# Patient Record
Sex: Male | Born: 1940 | Race: White | Hispanic: No | Smoking: Former smoker
Health system: Southern US, Community
[De-identification: ages and names within clinical notes are randomized; demographics above are authoritative.]

## PROBLEM LIST (undated history)

## (undated) DIAGNOSIS — I2510 Atherosclerotic heart disease of native coronary artery without angina pectoris: Secondary | ICD-10-CM

## (undated) DIAGNOSIS — E7800 Pure hypercholesterolemia, unspecified: Secondary | ICD-10-CM

## (undated) DIAGNOSIS — R42 Dizziness and giddiness: Secondary | ICD-10-CM

## (undated) DIAGNOSIS — E119 Type 2 diabetes mellitus without complications: Secondary | ICD-10-CM

## (undated) HISTORY — PX: NASAL SEPTUM SURGERY: SHX37

## (undated) HISTORY — PX: FOOT SURGERY: SHX648

## (undated) HISTORY — PX: KNEE SURGERY: SHX244

## (undated) HISTORY — PX: CARDIAC CATHETERIZATION: SHX172

---

## 1998-08-07 ENCOUNTER — Ambulatory Visit (HOSPITAL_BASED_OUTPATIENT_CLINIC_OR_DEPARTMENT_OTHER): Admission: RE | Admit: 1998-08-07 | Discharge: 1998-08-07 | Payer: Self-pay | Admitting: Orthopedic Surgery

## 2000-04-08 ENCOUNTER — Encounter: Payer: Self-pay | Admitting: Family Medicine

## 2000-04-08 ENCOUNTER — Ambulatory Visit (HOSPITAL_COMMUNITY): Admission: RE | Admit: 2000-04-08 | Discharge: 2000-04-08 | Payer: Self-pay | Admitting: Family Medicine

## 2002-03-23 ENCOUNTER — Ambulatory Visit (HOSPITAL_COMMUNITY): Admission: RE | Admit: 2002-03-23 | Discharge: 2002-03-23 | Payer: Self-pay | Admitting: Family Medicine

## 2002-03-23 ENCOUNTER — Encounter: Payer: Self-pay | Admitting: Family Medicine

## 2002-04-05 ENCOUNTER — Encounter: Payer: Self-pay | Admitting: Family Medicine

## 2002-04-05 ENCOUNTER — Encounter: Admission: RE | Admit: 2002-04-05 | Discharge: 2002-04-05 | Payer: Self-pay | Admitting: Family Medicine

## 2005-01-17 ENCOUNTER — Emergency Department (HOSPITAL_COMMUNITY): Admission: EM | Admit: 2005-01-17 | Discharge: 2005-01-17 | Payer: Self-pay | Admitting: Emergency Medicine

## 2005-01-24 ENCOUNTER — Ambulatory Visit (HOSPITAL_BASED_OUTPATIENT_CLINIC_OR_DEPARTMENT_OTHER): Admission: RE | Admit: 2005-01-24 | Discharge: 2005-01-24 | Payer: Self-pay | Admitting: Otolaryngology

## 2005-02-14 ENCOUNTER — Ambulatory Visit: Admission: RE | Admit: 2005-02-14 | Discharge: 2005-02-14 | Payer: Self-pay | Admitting: Family Medicine

## 2009-10-04 ENCOUNTER — Encounter: Admission: RE | Admit: 2009-10-04 | Discharge: 2009-11-22 | Payer: Self-pay | Admitting: Family Medicine

## 2010-10-24 ENCOUNTER — Encounter (INDEPENDENT_AMBULATORY_CARE_PROVIDER_SITE_OTHER): Payer: Self-pay | Admitting: Internal Medicine

## 2010-10-24 ENCOUNTER — Ambulatory Visit: Payer: Self-pay | Admitting: Cardiovascular Disease

## 2010-10-24 ENCOUNTER — Inpatient Hospital Stay (HOSPITAL_COMMUNITY): Admission: EM | Admit: 2010-10-24 | Discharge: 2010-10-27 | Payer: Self-pay | Admitting: Emergency Medicine

## 2010-11-20 ENCOUNTER — Emergency Department (HOSPITAL_COMMUNITY)
Admission: EM | Admit: 2010-11-20 | Discharge: 2010-11-20 | Payer: Self-pay | Source: Home / Self Care | Admitting: Family Medicine

## 2011-01-29 NOTE — H&P (Signed)
NAME:  Fred [name], Fred [name]               ACCOUNT NO.:  [id number]  MEDICAL RECORD NO.:  [id number]          PATIENT TYPE:  EMS  LOCATION:  MAJO                         FACILITY:  [facility]  PHYSICIAN:  Lucile Crater, MD         DATE OF BIRTH:  [birth year]  DATE OF ADMISSION:  10/24/2010 DATE OF DISCHARGE:                             HISTORY & PHYSICAL   CHIEF COMPLAINT:  Syncope.  HISTORY OF PRESENT ILLNESS:  The patient is a 70 year old Caucasian gentleman with a history of diabetes mellitus and hyperlipidemia.  He was attempting to have a bowel movement today and he experienced severe perianal pain and then he had a brief episode of loss of consciousness. The patient's wife witnessed the episode and she states that it lasted for about 2 minutes and after the patient regained his consciousness he was reportedly confused.  He had changed his shirt but he does not remember doing it.  There was no seizure witnessed.  He did not hit his head.  A CT of the head was done in the ER and it was negative for any acute changes.  Upon further questioning Fred [name] states that he had a similar episode in the past following viral gastroenteritis and he passed out when he was trying to stand up.  No workup was done at that time.  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  But he states that he has not been bothered by the symptoms in the past few years.  He denies having had any palpitations at the time of the episode.  He denies having any chest pain.  There is no blurry vision.  REVIEW OF SYSTEMS:  A complete review of systems was done which included general, head, eyes, ears, nose, throat, cardiovascular, respiratory, GI, GU, endocrine, musculoskeletal, skin, neurologic and psychiatric all within normal limits other than  what is mentioned in the history of present illness.  PAST MEDICAL HISTORY: 1. Diabetes mellitus. 2. Hyperlipidemia. 3. Concussion in October 2010. 4. Vertigo. 5. Remote history of tobacco abuse.  ALLERGIES:  None.  CURRENT MEDICATIONS: 1. Onglyza 5 mg once a day. 2. Glipizide 20 mg b.i.d. 3. Simvastatin 40 mg once a day. 4. Metformin 1000 mg once a day. 5. Aspirin 81 mg once a day. 6. Zocor dose unknown.  SOCIAL HISTORY:  He is a Research scientist (life sciences).  There is remote history of tobacco abuse.  He quit smoking in 1964.  He was smoking close to three packs a day.  He did that for 8 years.  There is a remote history of heavy alcohol consumption.  He does not drink any more.  He currently lives in [place].  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  FAMILY HISTORY:  There is no family history of sudden cardiac death, coronary artery disease or CVA.  PHYSICAL EXAMINATION:  VITALS: T-max 97.1, pulse rate  65, respiratory rate 19, blood pressure 110/58, O2 sats 98% on room air. GENERAL APPEARANCE: Not in acute distress, alert, awake and oriented to time, place, person and situation. HEENT: Normocephalic, atraumatic.  Pupils are equal and reactive to light and accommodation.  Extraocular are intact.  The mucous membranes are moist. NECK: Supple.  No JVD, lymphadenopathy or carotid bruit. CVS: Regular rhythm, rate is normal.  No murmurs, rubs or gallops. LUNGS: Clear to auscultation bilaterally. ABDOMEN: Benign. EXTREMITIES: No clubbing, cyanosis or edema. NEUROLOGIC:  Exam grossly nonfocal.  LABORATORY DATA AND STUDIES: 1. EKG shows normal sinus rhythm at rate of 69 beats per minute.     There is right axis deviation.  Normal PR and QT intervals.     Incomplete right bundle branch block is evident.  There are no ST     or T changes. 2. Troponin 0.01.  Lipase 42. 3. CK-MB 1.7. 4. Sodium 136, potassium 3.6, chloride 104, bicarb 24,  BUN 14,     creatinine 1, blood glucose 204. 5. Total protein 6.6, albumin 3.8, ALT 25, AST 25, alkaline     phosphatase 44, total bilirubin 0.4. 6. INR 0.94. 7. CT of the head:  No acute intracranial abnormalities.  Stable mild     age-appropriate cortical atrophy. 8. Stool for occult blood is negative. 9. WBC 9500, hemoglobin 14, hematocrit 41, platelets 196,000.     Differential is normal.  ASSESSMENT AND PLAN: 1. Syncope, multiple possibilities exist including a transient     ischemic attack versus an arrhythmia versus vasovagal amongst     others.  The CT of the head was negative.  He had an episode in the     past which was attributed to his gastroenteritis, no workup was     done.  He does have risk factors of diabetes, hypertension, age.     We will evaluate with an MRI of the brain.  We will also get a 2-D     echocardiogram.  We will consider getting a tilt-table study, will     hold off on it for now.  We will watch him in telemetry to evaluate     for any arrhythmias. 2. Diabetes mellitus.  Will hold off on oral medications while he is     in the hospital.  We will have him on sliding-scale coverage with     short-acting insulin. 3. Hyperlipidemia.  Will obtain a fasting lipid panel.  Will continue     the statin. 4. Deep vein thrombosis prophylaxis with sequential compression     devices.  CODE STATUS:  Full.    Lucile Crater, MD    TA/MEDQ  D:  10/24/2010  T:  10/24/2010  Job:  [id number]  Electronically Signed by Lucile Crater MD on 01/29/2011 04:10:03 PM

## 2011-02-19 LAB — GLUCOSE, CAPILLARY
Glucose-Capillary: 209 mg/dL — ABNORMAL HIGH (ref 70–99)
Glucose-Capillary: 227 mg/dL — ABNORMAL HIGH (ref 70–99)
Glucose-Capillary: 253 mg/dL — ABNORMAL HIGH (ref 70–99)
Glucose-Capillary: 278 mg/dL — ABNORMAL HIGH (ref 70–99)
Glucose-Capillary: 308 mg/dL — ABNORMAL HIGH (ref 70–99)
Glucose-Capillary: 372 mg/dL — ABNORMAL HIGH (ref 70–99)

## 2011-02-19 LAB — CK TOTAL AND CKMB (NOT AT ARMC): Total CK: 111 U/L (ref 7–232)

## 2011-02-19 LAB — PROTIME-INR: Prothrombin Time: 12.8 seconds (ref 11.6–15.2)

## 2011-02-19 LAB — CARDIAC PANEL(CRET KIN+CKTOT+MB+TROPI)
CK, MB: 1.3 ng/mL (ref 0.3–4.0)
Relative Index: 0.9 (ref 0.0–2.5)
Troponin I: <0.01 ng/mL (ref 0.00–0.06)

## 2011-02-19 LAB — URINE CULTURE

## 2011-02-19 LAB — LIPID PANEL
Cholesterol: 117 mg/dL (ref 0–200)
LDL Cholesterol: 68 mg/dL (ref 0–99)
Triglycerides: 92 mg/dL (ref <150)

## 2011-02-19 LAB — DIFFERENTIAL
Eosinophils Absolute: 0.4 10*3/uL (ref 0.0–0.7)
Lymphs Abs: 2.9 10*3/uL (ref 0.7–4.0)
Neutrophils Relative %: 54 % (ref 43–77)

## 2011-02-19 LAB — COMPREHENSIVE METABOLIC PANEL
ALT: 25 U/L (ref 0–53)
CO2: 24 mEq/L (ref 19–32)
Calcium: 8.5 mg/dL (ref 8.4–10.5)
Creatinine, Ser: 1.03 mg/dL (ref 0.4–1.5)
GFR calc non Af Amer: >60 mL/min (ref >60)
Glucose, Bld: 204 mg/dL — ABNORMAL HIGH (ref 70–99)
Sodium: 136 mEq/L (ref 135–145)

## 2011-02-19 LAB — TSH: TSH: 3.12 u[IU]/mL (ref 0.350–4.500)

## 2011-02-19 LAB — CBC
MCH: 31.9 pg (ref 26.0–34.0)
MCHC: 34.4 g/dL (ref 30.0–36.0)
Platelets: 196 10*3/uL (ref 150–400)
RDW: 12.8 % (ref 11.5–15.5)

## 2011-02-19 LAB — LIPASE, BLOOD: Lipase: 42 U/L (ref 11–59)

## 2011-02-19 LAB — PHOSPHORUS: Phosphorus: 3.3 mg/dL (ref 2.3–4.6)

## 2011-02-19 LAB — TROPONIN I: Troponin I: 0.01 ng/mL (ref 0.00–0.06)

## 2011-04-26 NOTE — Op Note (Signed)
NAME:  Fred [name], Fred [name]               ACCOUNT NO.:  [id number]   MEDICAL RECORD NO.:  [id number]          PATIENT TYPE:  AMB   LOCATION:  DSC                          FACILITY:  [facility]   PHYSICIAN:  Jefry H. Pollyann Kennedy, MD     DATE OF BIRTH:  1941/04/02   DATE OF PROCEDURE:  01/24/2005  DATE OF DISCHARGE:                                 OPERATIVE REPORT   PREOPERATIVE DIAGNOSES:  Displaced nasal fracture.   POSTOPERATIVE DIAGNOSES:  Displaced nasal fracture.   PROCEDURE:  Closed reduction nasal fracture with stabilization.   SURGEON:  Jefry H. Pollyann Kennedy, MD   ANESTHESIA:  General endotracheal anesthesia was used.   COMPLICATIONS:  None.   BLOOD LOSS:  Minimal.   FINDINGS:  Severely depressed right nasal bone fracture with lateralization  of the left nasal bone. 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 There was also an S-shaped deformity of the septum which  was corrected nicely with the procedure.   REFERRING PHYSICIAN:  Leonides Sake, M.D.   HISTORY:  This is a 70 year old gentleman who was sick with the flu about a  week and half ago and got lightheaded, fell on his face and fractured his  nose. The risks, benefits, alternatives, and complications of the procedure  were explained to the patient and his wife who seemed to understand and  agreed to surgery.   PROCEDURE:  The patient to the operating room, placed on the operating table  in supine position. Following induction of general endotracheal anesthesia,  the patient was draped in the standard fashion. Oxymetazoline spray was used  preoperatively in the nasal cavities. 1% Xylocaine with epinephrine was  infiltrated into the upper septum, the nasal vault mucosa and the superior  attachments of the middle turbinates bilaterally. Afrin soaked pledgets were  placed in the nasal cavities. A butter knife, nasal elevator and Ash forceps  were both used to elevate the septum and to elevate the right nasal  bone  while simultaneous digital manipulation was used to medialized the nasal  dorsum from the left side. There was good reduction of the fracture. Again  the saddle nose deformity was still remaining which I suspect was a  premorbid condition. The right nasal vault area was fairly stable but was  stabilized  additionally with some folded up Gelfoam. The nasal dorsum was dressed with  Benzoin, Steri-Strips and an Aquaplast splint. The pharynx was suctioned of  blood and secretions. The patient was then awakened, extubated, and  transferred to recovery in stable condition.      JHR/MEDQ  D:  01/24/2005  T:  01/24/2005  Job:  [id number]

## 2013-04-26 ENCOUNTER — Emergency Department (HOSPITAL_COMMUNITY)
Admission: EM | Admit: 2013-04-26 | Discharge: 2013-04-26 | Disposition: A | Discharge status: Home / Self Care | Payer: Medicare Other | Attending: Emergency Medicine | Admitting: Emergency Medicine

## 2013-04-26 ENCOUNTER — Emergency Department (HOSPITAL_COMMUNITY): Payer: Medicare Other

## 2013-04-26 ENCOUNTER — Encounter (HOSPITAL_COMMUNITY): Payer: Self-pay | Admitting: Emergency Medicine

## 2013-04-26 DIAGNOSIS — S81009A Unspecified open wound, unspecified knee, initial encounter: Secondary | ICD-10-CM | POA: Insufficient documentation

## 2013-04-26 DIAGNOSIS — Z79899 Other long term (current) drug therapy: Secondary | ICD-10-CM | POA: Insufficient documentation

## 2013-04-26 DIAGNOSIS — S81812A Laceration without foreign body, left lower leg, initial encounter: Secondary | ICD-10-CM

## 2013-04-26 DIAGNOSIS — W298XXA Contact with other powered powered hand tools and household machinery, initial encounter: Secondary | ICD-10-CM | POA: Insufficient documentation

## 2013-04-26 DIAGNOSIS — Y9389 Activity, other specified: Secondary | ICD-10-CM | POA: Insufficient documentation

## 2013-04-26 DIAGNOSIS — E119 Type 2 diabetes mellitus without complications: Secondary | ICD-10-CM | POA: Insufficient documentation

## 2013-04-26 DIAGNOSIS — Y929 Unspecified place or not applicable: Secondary | ICD-10-CM | POA: Insufficient documentation

## 2013-04-26 DIAGNOSIS — Z23 Encounter for immunization: Secondary | ICD-10-CM | POA: Insufficient documentation

## 2013-04-26 DIAGNOSIS — S81809A Unspecified open wound, unspecified lower leg, initial encounter: Secondary | ICD-10-CM | POA: Insufficient documentation

## 2013-04-26 HISTORY — DX: Type 2 diabetes mellitus without complications: E11.9

## 2013-04-26 MED ORDER — CEPHALEXIN 500 MG PO CAPS: 500.0000 mg | ORAL_CAPSULE | Freq: Four times a day (QID) | ORAL | Status: DC

## 2013-04-26 MED ORDER — LIDOCAINE-EPINEPHRINE 2 %-1:100000 IJ SOLN
20.0000 mL | INTRAMUSCULAR | Status: AC
  Administered 2013-04-26: 20 mL via INTRADERMAL
  Filled 2013-04-26: qty 20

## 2013-04-26 MED ORDER — HYDROCODONE-ACETAMINOPHEN 5-325 MG PO TABS: 1.0000 | ORAL_TABLET | Freq: Four times a day (QID) | ORAL | Status: DC | PRN

## 2013-04-26 MED ORDER — TETANUS-DIPHTH-ACELL PERTUSSIS 5-2.5-18.5 LF-MCG/0.5 IM SUSP
0.5000 mL | Freq: Once | INTRAMUSCULAR | Status: AC
  Administered 2013-04-26: 0.5 mL via INTRAMUSCULAR
  Filled 2013-04-26: qty 0.5

## 2013-04-26 NOTE — ED Notes (Signed)
BIB family. Laceration to left lower shin with chainsaw. Bleeding controlled at this time. Sensation intact distal to injury. Able to move toes and foot.

## 2013-04-26 NOTE — ED Provider Notes (Signed)
History    CSN: [id number] Arrival date & time 04/26/13  1531 First MD Initiated Contact with Patient 04/26/13 1632     Chief Complaint  Patient presents with  . Extremity Laceration   HPI Pt was using a chainsaw and accidentally cut his left lower shin.  He denies any other injuries.  No numbness or weakness.  No difficulty with movement.  No active bleeding at this time.  He is not sure when his last tetanus shot was. His pain is mild at this point.  Past Medical History  Diagnosis Date  . Diabetes mellitus without complication     History reviewed. No pertinent past surgical history.  History reviewed. No pertinent family history.  History  Substance Use Topics  . Smoking status: Never Smoker   . Smokeless tobacco: Not on file  . Alcohol Use: No      Review of Systems  All other systems reviewed and are negative.    Allergies  Review of patient's allergies indicates no known allergies.  Home Medications   Current Outpatient Rx  Name  Route  Sig  Dispense  Refill  . aspirin EC 81 MG tablet   Oral   Take 81 mg by mouth daily.         . Cholecalciferol (VITAMIN D) 2000 UNITS tablet   Oral   Take 2,000 Units by mouth daily.         Marland Kitchen CINNAMON PO   Oral   Take 1,000 mg by mouth 2 (two) times daily.         . fish oil-omega-3 fatty acids 1000 MG capsule   Oral   Take 1 g by mouth 2 (two) times daily.         . metFORMIN (GLUMETZA) 1000 MG (MOD) 24 hr tablet   Oral   Take 1,000 mg by mouth 2 (two) times daily.         . naproxen sodium (ANAPROX) 220 MG tablet   Oral   Take 220 mg by mouth daily as needed (for pain and for sleep).         Marland Kitchen OVER THE COUNTER MEDICATION   Oral   Take 1 tablet by mouth 2 (two) times daily. Gluco support         . SIMVASTATIN PO   Oral   Take 40 mg by mouth every evening.          . cephALEXin (KEFLEX) 500 MG capsule   Oral   Take 1 capsule (500 mg total) by mouth 4 (four) times daily.   28 capsule  0   . HYDROcodone-acetaminophen (NORCO) 5-325 MG per tablet   Oral   Take 1-2 tablets by mouth every 6 (six) hours as needed for pain.   16 tablet   0     BP 123/71  Pulse 86  Temp(Src) 98.2 F (36.8 C) (Oral)  Resp 17  SpO2 95%  Physical Exam  Nursing note and vitals reviewed. Constitutional: He appears well-developed and well-nourished. No distress.  HENT:  Head: Normocephalic and atraumatic.  Right Ear: External ear normal.  Left Ear: External ear normal.  Eyes: Conjunctivae are normal. Right eye exhibits no discharge. Left eye exhibits no discharge. No scleral icterus.  Neck: Neck supple. No tracheal deviation present.  Cardiovascular: Normal rate, regular rhythm and intact distal pulses.   Pulmonary/Chest: Effort normal and breath sounds normal. No stridor. No respiratory distress. He has no wheezes. He has no rales.  Abdominal: Soft.  He exhibits no distension.  Musculoskeletal:  Anterior left shin with macerated irregular laceration with tissue defect, superficial, no debris noted in the wound, distal nv intact, strong dp pulse, normal sensation  Neurological: He is alert. He has normal strength. No sensory deficit. Cranial nerve deficit:  no gross defecits noted. He exhibits normal muscle tone. He displays no seizure activity. Coordination normal.  Skin: Skin is warm and dry. No rash noted.  Psychiatric: He has a normal mood and affect.    ED Course  Procedures (including critical care time)  Labs Reviewed - No data to display Dg Tibia/fibula Left  04/26/2013   *RADIOLOGY REPORT*  Clinical Data: Laceration anteriorly, chainsaw, history diabetes  LEFT TIBIA AND FIBULA - 2 VIEW  Comparison: None  Findings: Pretibial soft tissue irregularity and gas present at presumed laceration site at mid lower leg. No acute fracture, dislocation or bone destruction. No definite radiopaque foreign body. Bones appear slightly demineralized.  IMPRESSION: No acute osseous abnormalities.    Original Report Authenticated By: Ulyses Southward, M.D.     1. Laceration of leg, left, initial encounter       MDM  Laceration repaired under my supervision.  No sign of fracture.  Wound irrigated.  Due to tissue loss wound re approximated loosely.        Celene Kras, MD 04/26/13 [phone]

## 2013-04-26 NOTE — ED Notes (Signed)
Patient transported to X-ray 

## 2013-04-26 NOTE — ED Notes (Signed)
EDMD at bedside suturing lac

## 2013-07-20 NOTE — Procedures (Signed)
LACERATION REPAIR Date/Time: 07/20/2013 12:29 PM Performed by: Toney Sang Authorized by: Linwood Dibbles R Consent: Verbal consent obtained. Consent given by: patient Imaging studies: imaging studies available Patient identity confirmed: verbally with patient, arm band and hospital-assigned identification number Body area: lower extremity Location details: left lower leg Foreign bodies: no foreign bodies Tendon involvement: none Nerve involvement: none Vascular damage: no Anesthesia: local infiltration Local anesthetic: lidocaine 2% with epinephrine Patient sedated: no Preparation: Patient was prepped and draped in the usual sterile fashion. Irrigation solution: saline Amount of cleaning: extensive Skin closure: 3-0 nylon Approximation: loose Approximation difficulty: simple Patient tolerance: Patient tolerated the procedure well with no immediate complications.

## 2013-11-02 ENCOUNTER — Other Ambulatory Visit: Payer: Self-pay | Admitting: Family Medicine

## 2013-11-02 ENCOUNTER — Ambulatory Visit
Admission: RE | Admit: 2013-11-02 | Discharge: 2013-11-02 | Disposition: A | Discharge status: Home / Self Care | Payer: Medicare Other | Source: Ambulatory Visit | Attending: Family Medicine | Admitting: Family Medicine

## 2013-11-02 DIAGNOSIS — M545 Low back pain: Secondary | ICD-10-CM

## 2013-11-11 ENCOUNTER — Emergency Department (HOSPITAL_COMMUNITY)
Admission: EM | Admit: 2013-11-11 | Discharge: 2013-11-11 | Disposition: A | Discharge status: Home / Self Care | Payer: Medicare Other | Attending: Emergency Medicine | Admitting: Emergency Medicine

## 2013-11-11 ENCOUNTER — Emergency Department (HOSPITAL_COMMUNITY): Payer: Medicare Other

## 2013-11-11 ENCOUNTER — Encounter (HOSPITAL_COMMUNITY): Payer: Self-pay | Admitting: Emergency Medicine

## 2013-11-11 DIAGNOSIS — Z7982 Long term (current) use of aspirin: Secondary | ICD-10-CM | POA: Insufficient documentation

## 2013-11-11 DIAGNOSIS — R55 Syncope and collapse: Secondary | ICD-10-CM | POA: Insufficient documentation

## 2013-11-11 DIAGNOSIS — Z792 Long term (current) use of antibiotics: Secondary | ICD-10-CM | POA: Insufficient documentation

## 2013-11-11 DIAGNOSIS — E119 Type 2 diabetes mellitus without complications: Secondary | ICD-10-CM | POA: Insufficient documentation

## 2013-11-11 DIAGNOSIS — E7800 Pure hypercholesterolemia, unspecified: Secondary | ICD-10-CM | POA: Insufficient documentation

## 2013-11-11 DIAGNOSIS — R42 Dizziness and giddiness: Secondary | ICD-10-CM | POA: Insufficient documentation

## 2013-11-11 DIAGNOSIS — Z79899 Other long term (current) drug therapy: Secondary | ICD-10-CM | POA: Insufficient documentation

## 2013-11-11 HISTORY — DX: Pure hypercholesterolemia, unspecified: E78.00

## 2013-11-11 HISTORY — DX: Dizziness and giddiness: R42

## 2013-11-11 LAB — POCT I-STAT, CHEM 8
BUN: 16 mg/dL (ref 6–23)
Calcium, Ion: 1.18 mmol/L (ref 1.13–1.30)
Chloride: 103 mEq/L (ref 96–112)
Creatinine, Ser: 1.2 mg/dL (ref 0.50–1.35)
Glucose, Bld: 203 mg/dL — ABNORMAL HIGH (ref 70–99)
HCT: 41 % (ref 39.0–52.0)
Hemoglobin: 13.9 g/dL (ref 13.0–17.0)
Potassium: 3.9 mEq/L (ref 3.5–5.1)
Sodium: 139 mEq/L (ref 135–145)
TCO2: 25 mmol/L (ref 0–100)

## 2013-11-11 LAB — GLUCOSE, CAPILLARY: Glucose-Capillary: 195 mg/dL — ABNORMAL HIGH (ref 70–99)

## 2013-11-11 LAB — CBC
HCT: 44.9 % (ref 39.0–52.0)
Hemoglobin: 15.6 g/dL (ref 13.0–17.0)
MCH: 32.8 pg (ref 26.0–34.0)
MCHC: 34.7 g/dL (ref 30.0–36.0)
MCV: 94.3 fL (ref 78.0–100.0)
Platelets: 231 10*3/uL (ref 150–400)
RBC: 4.76 MIL/uL (ref 4.22–5.81)
RDW: 12.8 % (ref 11.5–15.5)
WBC: 8 10*3/uL (ref 4.0–10.5)

## 2013-11-11 LAB — URINALYSIS, ROUTINE W REFLEX MICROSCOPIC
Bilirubin Urine: NEGATIVE
Hgb urine dipstick: NEGATIVE
Ketones, ur: NEGATIVE mg/dL
Leukocytes, UA: NEGATIVE
Nitrite: NEGATIVE
Protein, ur: NEGATIVE mg/dL
Urobilinogen, UA: 0.2 mg/dL (ref 0.0–1.0)

## 2013-11-11 LAB — POCT I-STAT TROPONIN I: Troponin i, poc: 0 ng/mL (ref 0.00–0.08)

## 2013-11-11 MED ORDER — SODIUM CHLORIDE 0.9 % IV BOLUS (SEPSIS)
1000.0000 mL | Freq: Once | INTRAVENOUS | Status: AC
  Administered 2013-11-11: 1000 mL via INTRAVENOUS

## 2013-11-11 MED ORDER — SODIUM CHLORIDE 0.9 % IV SOLN: INTRAVENOUS | Status: DC

## 2013-11-11 NOTE — ED Notes (Signed)
Pt ambulated to restroom with slow, steady gait, and denies dizziness

## 2013-11-11 NOTE — ED Notes (Signed)
Patient transported to CT 

## 2013-11-11 NOTE — ED Notes (Signed)
Pt having dizziness x 2 days, hx of vertigo. Was at church with increase in dizziness and unsteady gait.

## 2013-11-11 NOTE — ED Provider Notes (Signed)
CSN: [id number]     Arrival date & time 11/11/13  1447 History   First MD Initiated Contact with Patient 11/11/13 1504     Chief Complaint  Patient presents with  . Near Syncope   (Consider location/radiation/quality/duration/timing/severity/associated sxs/prior Treatment) HPI  72 year old male with history of non-insulin-dependent diabetes, vertigo, and high cholesterol presents for evaluations of dizziness/lightheadedness. Patient reports for the past 2 weeks he has had intermittent bouts of lightheadedness. Symptoms usually happen with positional changes. He usually has to pause for seconds to regain his balance however for the past 3 or 4 days this particular sensation has been progressively worse. Today while at church as he was standing he became lightheadedness, unsteady and fell. He did not hit his head and had no loss of consciousness. He did not injure himself but felt that he would need further evaluation in the ER. Patient states that aside from a lightheadedness he denies having any fever, chills, headache, double vision, slurring speech, trouble thinking, neck pain, chest pain, shortness of breath, productive cough, abdominal pain, nausea, vomiting, diarrhea, dysuria, numbness, or rash. He did notice 2 separate episodes of specks of blood in his undergarment. He denies any sensations of room spinning around. He has been taking muscle relaxant prescribed by his doctor for his low back pain which has improved. He discontinued medication 2 days ago. No other medication changes. Otherwise he has been eating and drinking as usual.  Prior hx revealed pt was admitted for evaluation of syncope in 2011, was found to be due to reflex syncope.  Normal 2D echo, brain MRI without evidence of stroke.    Past Medical History  Diagnosis Date  . Diabetes mellitus without complication   . Vertigo   . High cholesterol    History reviewed. No pertinent past surgical history. History reviewed. No  pertinent family history. History  Substance Use Topics  . Smoking status: Never Smoker   . Smokeless tobacco: Not on file  . Alcohol Use: No    Review of Systems  All other systems reviewed and are negative.    Allergies  Review of patient's allergies indicates no known allergies.  Home Medications   Current Outpatient Rx  Name  Route  Sig  Dispense  Refill  . aspirin EC 81 MG tablet   Oral   Take 81 mg by mouth daily.         . cephALEXin (KEFLEX) 500 MG capsule   Oral   Take 1 capsule (500 mg total) by mouth 4 (four) times daily.   28 capsule   0   . Cholecalciferol (VITAMIN D) 2000 UNITS tablet   Oral   Take 2,000 Units by mouth daily.         Marland Kitchen CINNAMON PO   Oral   Take 1,000 mg by mouth 2 (two) times daily.         . fish oil-omega-3 fatty acids 1000 MG capsule   Oral   Take 1 g by mouth 2 (two) times daily.         Marland Kitchen HYDROcodone-acetaminophen (NORCO) 5-325 MG per tablet   Oral   Take 1-2 tablets by mouth every 6 (six) hours as needed for pain.   16 tablet   0   . metFORMIN (GLUMETZA) 1000 MG (MOD) 24 hr tablet   Oral   Take 1,000 mg by mouth 2 (two) times daily.         . naproxen sodium (ANAPROX) 220 MG tablet  Oral   Take 220 mg by mouth daily as needed (for pain and for sleep).         Marland Kitchen OVER THE COUNTER MEDICATION   Oral   Take 1 tablet by mouth 2 (two) times daily. Gluco support         . SIMVASTATIN PO   Oral   Take 40 mg by mouth every evening.           BP 132/62  Pulse 96  Temp(Src) 98.6 F (37 C)  Resp 20  SpO2 97% Physical Exam  Nursing note and vitals reviewed. Constitutional: He is oriented to person, place, and time. He appears well-developed and well-nourished. No distress.  Awake, alert, nontoxic appearance  HENT:  Head: Atraumatic.  Right Ear: External ear normal.  Left Ear: External ear normal.  Mouth/Throat: Oropharynx is clear and moist.  Eyes: Conjunctivae are normal. Right eye exhibits no  discharge. Left eye exhibits no discharge.  Neck: Normal range of motion. Neck supple.  Cardiovascular: Normal rate and regular rhythm.  Exam reveals no gallop and no friction rub.   No murmur heard. Pulmonary/Chest: Effort normal. No respiratory distress. He exhibits no tenderness.  Abdominal: Soft. There is no tenderness. There is no rebound.  Genitourinary:  Prostate non tender, normal rectal tone, no mass, no frank blood, normal color stool.  Musculoskeletal: He exhibits no tenderness.  ROM appears intact, no obvious focal weakness  Neurological: He is alert and oriented to person, place, and time.  Speech clear, pupils equal round reactive to light, extraocular movements intact   Normal peripheral visual fields Cranial nerves III through XII normal including no facial droop Cranial Nerves:  II:  Visual fields grossly normal with blinking noted to bilateral confrontation, pupils equal, round, reactive to light and accommodation  III,IV, VI: ptosis not present, extra-ocular motions intact bilaterally  V,VII: smile symmetric  VIII: hearing normal bilaterally  IX,X: gag reflex present  XI: bilateral shoulder shrug  XII: midline tongue extension Follows commands, moves all extremities x4, normal strength to bilateral upper and lower extremities at all major muscle groups including grip Sensation normal to light touch  Coordination intact, no limb ataxia, finger-nose-finger normal Rapid alternating movements normal No pronator drift Gait normal   Skin: Skin is warm and dry. No rash noted.  Psychiatric: He has a normal mood and affect.    ED Course  Procedures (including critical care time)   Date: 11/11/2013  Rate: 92  Rhythm: normal sinus rhythm  QRS Axis: normal  Intervals: normal  ST/T Wave abnormalities: normal  Conduction Disutrbances: none  Narrative Interpretation:   Old EKG Reviewed: No significant changes noted     4:10 PM Patient presents with near syncope  and a recent fall without any significant trauma. No vertiginous symptoms. He does report having specks of blood in his undergarment although no evidence of dysuria. No obvious gross focal neuro deficit on initial exam. Workup initiated.  7:05 PM With with positive orthostasis, likely autonomic dysfunction.  No evidence of infection, no significant anemia, head CT without acute changes.  I do not suspect acute stroke.  Pt able to tolerates PO. IVF given.  Care discussed with attending.  Pt felt he prefers to go home.    9:12 PM Pt stable for discharge.  Return precaution discussed.  Attending agrees with plan.    Labs Review Labs Reviewed  URINALYSIS, ROUTINE W REFLEX MICROSCOPIC - Abnormal; Notable for the following:    Glucose, UA 250 (*)  All other components within normal limits  GLUCOSE, CAPILLARY - Abnormal; Notable for the following:    Glucose-Capillary 195 (*)    All other components within normal limits  POCT I-STAT, CHEM 8 - Abnormal; Notable for the following:    Glucose, Bld 203 (*)    All other components within normal limits  CBC  POCT I-STAT TROPONIN I   Imaging Review Ct Head Wo Contrast  11/11/2013   CLINICAL DATA:  Syncopal episode, status post fall.  EXAM: CT HEAD WITHOUT CONTRAST  TECHNIQUE: Contiguous axial images were obtained from the base of the skull through the vertex without intravenous contrast.  COMPARISON:  MRI of the brain dated October 24, 2010. And CT scan of the brain of the same day  FINDINGS: There is mild diffuse age-appropriate cerebral atrophy with mild compensatory ventriculomegaly. These findings are stable. There is no evidence of an acute intracranial hemorrhage. There is no evidence of an evolving ischemic infarction. The cerebellum and brainstem exhibit normal density. There are no abnormal intracranial calcifications.  At bone window settings the observed portions of the paranasal sinuses and mastoid air cells are clear. There is no evidence  of an acute skull fracture.  IMPRESSION: 1. There is no evidence of an acute intracranial hemorrhage nor of an evolving ischemic infarction. 2. There is mild age appropriate cerebral atrophy with mild compensatory ventriculomegaly. 3. There is no evidence of an acute skull fracture.   Electronically Signed   By: David  [place]   On: 11/11/2013 16:45    EKG Interpretation   None       MDM   1. Near syncope    BP 139/75  Pulse 73  Temp(Src) 97.7 F (36.5 C) (Oral)  Resp 16  SpO2 97%  I have reviewed nursing notes and vital signs. I personally reviewed the imaging tests through PACS system  I reviewed available ER/hospitalization records thought the EMR     Fayrene Helper, [place] 11/11/13 2112

## 2013-11-11 NOTE — ED Notes (Signed)
PT ambulates well to restroom

## 2013-11-12 LAB — OCCULT BLOOD, POC DEVICE: Fecal Occult Bld: NEGATIVE

## 2013-11-17 NOTE — ED Provider Notes (Signed)
Medical screening examination/treatment/procedure(s) were performed by non-physician practitioner and as supervising physician I was immediately available for consultation/collaboration.  EKG Interpretation    Date/Time:  Thursday November 11 2013 15:05:54 EST Ventricular Rate:  90 PR Interval:  150 QRS Duration: 94 QT Interval:  370 QTC Calculation: 453 R Axis:   73 Text Interpretation:  Age not entered, assumed to be  72 years old for purpose of ECG interpretation Sinus rhythm Probable left atrial enlargement ED PHYSICIAN INTERPRETATION AVAILABLE IN [facility] Confirmed by TEST, RECORD ([id number]), editor CLAYTON  CCT  CETT, ROBIN (2) on 11/13/2013 8:20:36 AM             Raeford Razor, MD 11/17/13 1339

## 2016-04-08 ENCOUNTER — Ambulatory Visit
Admission: RE | Admit: 2016-04-08 | Discharge: 2016-04-08 | Disposition: A | Discharge status: Home / Self Care | Payer: Medicare Other | Source: Ambulatory Visit | Attending: Family Medicine | Admitting: Family Medicine

## 2016-04-08 ENCOUNTER — Other Ambulatory Visit: Payer: Self-pay | Admitting: Family Medicine

## 2016-04-08 DIAGNOSIS — M542 Cervicalgia: Secondary | ICD-10-CM

## 2018-10-26 DIAGNOSIS — H9121 Sudden idiopathic hearing loss, right ear: Secondary | ICD-10-CM | POA: Insufficient documentation

## 2018-12-07 ENCOUNTER — Other Ambulatory Visit: Payer: Self-pay | Admitting: Otolaryngology

## 2018-12-07 DIAGNOSIS — H9121 Sudden idiopathic hearing loss, right ear: Secondary | ICD-10-CM

## 2018-12-17 ENCOUNTER — Ambulatory Visit
Admission: RE | Admit: 2018-12-17 | Discharge: 2018-12-17 | Disposition: A | Discharge status: Home / Self Care | Payer: Medicare Other | Source: Ambulatory Visit | Attending: Otolaryngology | Admitting: Otolaryngology

## 2018-12-17 DIAGNOSIS — H9121 Sudden idiopathic hearing loss, right ear: Secondary | ICD-10-CM

## 2018-12-17 MED ORDER — GADOBENATE DIMEGLUMINE 529 MG/ML IV SOLN
19.0000 mL | Freq: Once | INTRAVENOUS | Status: AC | PRN
  Administered 2018-12-17: 19 mL via INTRAVENOUS

## 2019-02-25 ENCOUNTER — Emergency Department (HOSPITAL_BASED_OUTPATIENT_CLINIC_OR_DEPARTMENT_OTHER): Payer: Medicare Other

## 2019-02-25 ENCOUNTER — Encounter (HOSPITAL_BASED_OUTPATIENT_CLINIC_OR_DEPARTMENT_OTHER): Payer: Self-pay | Admitting: *Deleted

## 2019-02-25 ENCOUNTER — Other Ambulatory Visit: Payer: Self-pay

## 2019-02-25 ENCOUNTER — Emergency Department (HOSPITAL_BASED_OUTPATIENT_CLINIC_OR_DEPARTMENT_OTHER)
Admission: EM | Admit: 2019-02-25 | Discharge: 2019-02-25 | Disposition: A | Discharge status: Home / Self Care | Payer: Medicare Other | Attending: Emergency Medicine | Admitting: Emergency Medicine

## 2019-02-25 DIAGNOSIS — Z7984 Long term (current) use of oral hypoglycemic drugs: Secondary | ICD-10-CM | POA: Diagnosis not present

## 2019-02-25 DIAGNOSIS — Z79899 Other long term (current) drug therapy: Secondary | ICD-10-CM | POA: Insufficient documentation

## 2019-02-25 DIAGNOSIS — E119 Type 2 diabetes mellitus without complications: Secondary | ICD-10-CM | POA: Diagnosis not present

## 2019-02-25 DIAGNOSIS — N200 Calculus of kidney: Secondary | ICD-10-CM | POA: Insufficient documentation

## 2019-02-25 DIAGNOSIS — R1031 Right lower quadrant pain: Secondary | ICD-10-CM | POA: Diagnosis present

## 2019-02-25 DIAGNOSIS — Z7982 Long term (current) use of aspirin: Secondary | ICD-10-CM | POA: Diagnosis not present

## 2019-02-25 LAB — CBC WITH DIFFERENTIAL/PLATELET
Abs Immature Granulocytes: 0.03 10*3/uL (ref 0.00–0.07)
Basophils Absolute: 0.1 10*3/uL (ref 0.0–0.1)
Basophils Relative: 1 %
EOS PCT: 8 %
Eosinophils Absolute: 0.9 10*3/uL — ABNORMAL HIGH (ref 0.0–0.5)
HCT: 41.3 % (ref 39.0–52.0)
Hemoglobin: 13.7 g/dL (ref 13.0–17.0)
Immature Granulocytes: 0 %
Lymphocytes Relative: 25 %
Lymphs Abs: 2.6 10*3/uL (ref 0.7–4.0)
MCH: 31.9 pg (ref 26.0–34.0)
MCHC: 33.2 g/dL (ref 30.0–36.0)
MCV: 96.3 fL (ref 80.0–100.0)
MONO ABS: 1.1 10*3/uL — AB (ref 0.1–1.0)
Monocytes Relative: 11 %
Neutro Abs: 5.8 10*3/uL (ref 1.7–7.7)
Neutrophils Relative %: 55 %
Platelets: 233 10*3/uL (ref 150–400)
RBC: 4.29 MIL/uL (ref 4.22–5.81)
RDW: 12.8 % (ref 11.5–15.5)
WBC: 10.5 10*3/uL (ref 4.0–10.5)
nRBC: 0 % (ref 0.0–0.2)

## 2019-02-25 LAB — URINALYSIS, ROUTINE W REFLEX MICROSCOPIC
Bilirubin Urine: NEGATIVE
Glucose, UA: >=500 mg/dL — AB
Ketones, ur: NEGATIVE mg/dL
Leukocytes,Ua: NEGATIVE
Nitrite: NEGATIVE
Protein, ur: NEGATIVE mg/dL
Specific Gravity, Urine: 1.015 (ref 1.005–1.030)
pH: 6.5 (ref 5.0–8.0)

## 2019-02-25 LAB — BASIC METABOLIC PANEL
Anion gap: 7 (ref 5–15)
BUN: 17 mg/dL (ref 8–23)
CO2: 24 mmol/L (ref 22–32)
Calcium: 9.1 mg/dL (ref 8.9–10.3)
Chloride: 104 mmol/L (ref 98–111)
Creatinine, Ser: 1.1 mg/dL (ref 0.61–1.24)
GFR calc Af Amer: >60 mL/min (ref >60)
Glucose, Bld: 239 mg/dL — ABNORMAL HIGH (ref 70–99)
Potassium: 4.1 mmol/L (ref 3.5–5.1)
Sodium: 135 mmol/L (ref 135–145)

## 2019-02-25 LAB — URINALYSIS, MICROSCOPIC (REFLEX)

## 2019-02-25 MED ORDER — FENTANYL CITRATE (PF) 100 MCG/2ML IJ SOLN
50.0000 ug | INTRAMUSCULAR | Status: AC | PRN
  Administered 2019-02-25 (×2): 50 ug via INTRAVENOUS
  Filled 2019-02-25 (×2): qty 2

## 2019-02-25 MED ORDER — HYDROCODONE-ACETAMINOPHEN 5-325 MG PO TABS: 1.0000 | ORAL_TABLET | ORAL | 0 refills | Status: DC | PRN

## 2019-02-25 MED ORDER — ONDANSETRON HCL 4 MG/2ML IJ SOLN
4.0000 mg | Freq: Once | INTRAMUSCULAR | Status: AC
  Administered 2019-02-25: 4 mg via INTRAVENOUS
  Filled 2019-02-25: qty 2

## 2019-02-25 MED ORDER — HYDROCODONE-ACETAMINOPHEN 5-325 MG PO TABS
1.0000 | ORAL_TABLET | Freq: Once | ORAL | Status: AC
  Administered 2019-02-25: 1 via ORAL
  Filled 2019-02-25: qty 1

## 2019-02-25 MED ORDER — ONDANSETRON 4 MG PO TBDP: ORAL_TABLET | ORAL | 0 refills | Status: DC

## 2019-02-25 NOTE — ED Provider Notes (Signed)
[facility] EMERGENCY DEPARTMENT Provider Note   CSN: [id number] Arrival date & time: 02/25/19  2038    History   Chief Complaint Chief Complaint  Patient presents with   Abdominal Pain    HPI Fred [name] is a 78 y.o. male.     Patient is a 78 year old male who presents with right flank pain.  He states that started about 8:00 this evening.  He denies any known history of kidney stones.  He has had some associated nausea but no vomiting.  He has had a little bit of pressure on urination but no burning on urination.  No known fevers.  No change in bowels.  He has not taken anything at home for the pain.  It is been waxing and waning but worsening in intensity.     Past Medical History:  Diagnosis Date   Diabetes mellitus without complication (HCC)    High cholesterol    Vertigo     There are no active problems to display for this patient.   Past Surgical History:  Procedure Laterality Date   FOOT SURGERY     KNEE SURGERY     NASAL SEPTUM SURGERY          Home Medications    Prior to Admission medications   Medication Sig Start Date End Date Taking? Authorizing Provider  aspirin EC 81 MG tablet Take 81 mg by mouth daily.   Yes [provider]  Cholecalciferol (VITAMIN D) 2000 UNITS tablet Take 2,000 Units by mouth daily.   Yes [provider]  fish oil-omega-3 fatty acids 1000 MG capsule Take 1 g by mouth 2 (two) times daily.   Yes [provider]  glimepiride (AMARYL) 2 MG tablet Take 2 mg by mouth daily with breakfast.   Yes [provider]  Insulin Glargine (TOUJEO SOLOSTAR [place]) Inject into the skin.   Yes [provider]  metFORMIN (GLUMETZA) 1000 MG (MOD) 24 hr tablet Take 1,000 mg by mouth 2 (two) times daily.   Yes [provider]  simvastatin (ZOCOR) 40 MG tablet Take 40 mg by mouth daily.   Yes [provider]  solifenacin (VESICARE) 5 MG tablet Take by mouth.   Yes  [provider]  aspirin EC 81 MG tablet Take by mouth.    [provider]  cyclobenzaprine (FLEXERIL) 5 MG tablet Take 5 mg by mouth 3 (three) times daily as needed for muscle spasms.    [provider]  glimepiride (AMARYL) 2 MG tablet Take by mouth.    [provider]  HYDROcodone-acetaminophen (NORCO/VICODIN) 5-325 MG tablet Take 1-2 tablets by mouth every 4 (four) hours as needed. 02/25/19   Rolan Bucco, MD  metFORMIN (GLUMETZA) 1000 MG (MOD) 24 hr tablet Take by mouth.    [provider]  naproxen sodium (ALEVE) 220 MG tablet Take 220 mg by mouth daily as needed (for back pain).    [provider]  ondansetron (ZOFRAN ODT) 4 MG disintegrating tablet 4mg  ODT q4 hours prn nausea/vomit 02/25/19   Rolan Bucco, MD  simvastatin (ZOCOR) 40 MG tablet Take by mouth.    [provider]    Family History No family history on file.  Social History Social History   Tobacco Use   Smoking status: Never Smoker   Smokeless tobacco: Never Used  Substance Use Topics   Alcohol use: No   Drug use: No     Allergies   Patient has no known allergies.  Review of Systems Review of Systems  Constitutional: Negative for chills, diaphoresis, fatigue and fever.  HENT: Negative for congestion, rhinorrhea and sneezing.   Eyes: Negative.   Respiratory: Negative for cough, chest tightness and shortness of breath.   Cardiovascular: Negative for chest pain and leg swelling.  Gastrointestinal: Positive for abdominal pain and nausea. Negative for blood in stool, diarrhea and vomiting.  Genitourinary: Positive for flank pain. Negative for difficulty urinating, frequency and hematuria.  Musculoskeletal: Negative for arthralgias and back pain.  Skin: Negative for rash.  Neurological: Negative for dizziness, speech difficulty, weakness, numbness and headaches.     Physical Exam Updated Vital Signs BP (!) 157/59 (BP Location: Right  Arm)    Pulse 61    Temp 97.7 F (36.5 C) (Oral)    Resp 19    Ht 5\' 7"  (1.702 m)    Wt 90.7 kg    SpO2 97%    BMI 31.32 kg/m   Physical Exam Constitutional:      Appearance: He is well-developed.  HENT:     Head: Normocephalic and atraumatic.  Eyes:     Pupils: Pupils are equal, round, and reactive to light.  Neck:     Musculoskeletal: Normal range of motion and neck supple.  Cardiovascular:     Rate and Rhythm: Normal rate and regular rhythm.     Heart sounds: Normal heart sounds.  Pulmonary:     Effort: Pulmonary effort is normal. No respiratory distress.     Breath sounds: Normal breath sounds. No wheezing or rales.  Chest:     Chest wall: No tenderness.  Abdominal:     General: Bowel sounds are normal.     Palpations: Abdomen is soft.     Tenderness: There is abdominal tenderness in the right lower quadrant. There is no guarding or rebound.     Comments: Tenderness to the right flank  Genitourinary:    Comments: No inguinal or testicular tenderness Musculoskeletal: Normal range of motion.  Lymphadenopathy:     Cervical: No cervical adenopathy.  Skin:    General: Skin is warm and dry.     Findings: No rash.  Neurological:     Mental Status: He is alert and oriented to person, place, and time.      ED Treatments / Results  Labs (all labs ordered are listed, but only abnormal results are displayed) Labs Reviewed  URINALYSIS, ROUTINE W REFLEX MICROSCOPIC - Abnormal; Notable for the following components:      Result Value   Glucose, UA >=500 (*)    Hgb urine dipstick TRACE (*)    All other components within normal limits  BASIC METABOLIC PANEL - Abnormal; Notable for the following components:   Glucose, Bld 239 (*)    All other components within normal limits  CBC WITH DIFFERENTIAL/PLATELET - Abnormal; Notable for the following components:   Monocytes Absolute 1.1 (*)    Eosinophils Absolute 0.9 (*)    All other components within normal limits  URINALYSIS,  MICROSCOPIC (REFLEX)    EKG None  Radiology Ct Renal Stone Study  Result Date: 02/25/2019 CLINICAL DATA:  Right lower quadrant pain for 2 weeks. EXAM: CT ABDOMEN AND PELVIS WITHOUT CONTRAST TECHNIQUE: Multidetector CT imaging of the abdomen and pelvis was performed following the standard protocol without IV contrast. COMPARISON:  None. FINDINGS: LOWER CHEST: There is no basilar pleural or apical pericardial effusion. HEPATOBILIARY: The hepatic contours and density are normal. There is no intra- or extrahepatic biliary dilatation. The  gallbladder is normal. PANCREAS: The pancreatic parenchymal contours are normal and there is no ductal dilatation. There is no peripancreatic fluid collection. SPLEEN: Normal. ADRENALS/URINARY TRACT: --Adrenal glands: Normal. --Right kidney/ureter: There is a 3 x 4 mm obstructive calculus within the distal right ureter, near the ureterovesical junction. There is a nonobstructing stone near the lower pole measuring 2 mm. There is mild periureteral stranding. No hydronephrosis. --Left kidney/ureter: Nonobstructing stone near the lower pole measures 3 mm. No hydronephrosis. --Urinary bladder: Nondistended STOMACH/BOWEL: --Stomach/Duodenum: There is no hiatal hernia or other gastric abnormality. The duodenal course and caliber are normal. --Small bowel: No dilatation or inflammation. --Colon: No focal abnormality. --Appendix: Normal. VASCULAR/LYMPHATIC: There is aortic atherosclerosis without hemodynamically significant stenosis. no abdominal or pelvic lymphadenopathy. REPRODUCTIVE: Normal prostate size with symmetric seminal vesicles. MUSCULOSKELETAL. No bony spinal canal stenosis or focal osseous abnormality. OTHER: None. IMPRESSION: 1. Right obstructive uropathy with 3 x 4 mm stone in the distal right ureter, near the ureterovesical junction. Mild proximal periureteral stranding without hydronephrosis. 2. Bilateral nonobstructing nephrolithiasis. Aortic Atherosclerosis  (ICD10-I70.0). Electronically Signed   By: Deatra Robinson M.D.   On: 02/25/2019 21:54    Procedures Procedures (including critical care time)  Medications Ordered in ED Medications  ondansetron [facility]) injection 4 mg (4 mg Intravenous Given 02/25/19 2107)  fentaNYL (SUBLIMAZE) injection 50 mcg (50 mcg Intravenous Given 02/25/19 2221)     Initial Impression / Assessment and Plan / ED Course  I have reviewed the triage vital signs and the nursing notes.  Pertinent labs & imaging results that were available during my care of the patient were reviewed by me and considered in my medical decision making (see chart for details).        Patient presents with right flank pain.  CT scan shows a 3 mm stone in the right UVJ.  His creatinine is normal.  His urine does not appear to be infected.  His pain is well controlled after treatment in the emergency department.  He was discharged home in good condition.  He was encouraged to follow-up with [facility] within the next week.  He was given prescription for Vicodin and Zofran for symptomatic relief.  Return precautions were given.  Final Clinical Impressions(s) / ED Diagnoses   Final diagnoses:  Kidney stone    ED Discharge Orders         Ordered    HYDROcodone-acetaminophen (NORCO/VICODIN) 5-325 MG tablet  Every 4 hours PRN     02/25/19 2258    ondansetron (ZOFRAN ODT) 4 MG disintegrating tablet     02/25/19 2258           Rolan Bucco, MD 02/25/19 2301

## 2019-02-25 NOTE — ED Notes (Signed)
Date and time results received: 02/25/19 2244 (use smartphrase ".now" to insert current time)  Test: lactic acid Critical Value: 3  Name of Provider Notified: Dr. Fredderick Phenix  Orders Received? Or Actions Taken?: no new orders

## 2019-02-25 NOTE — ED Notes (Signed)
Patient transported to CT 

## 2019-02-25 NOTE — ED Triage Notes (Signed)
Abdominal pain and vomiting tonight. He thinks he passed a kidney stone 2 days ago.

## 2019-08-30 ENCOUNTER — Other Ambulatory Visit: Payer: Self-pay

## 2019-08-30 ENCOUNTER — Emergency Department (HOSPITAL_COMMUNITY)
Admission: EM | Admit: 2019-08-30 | Discharge: 2019-08-30 | Disposition: A | Discharge status: Home / Self Care | Payer: Medicare Other | Attending: Emergency Medicine | Admitting: Emergency Medicine

## 2019-08-30 ENCOUNTER — Encounter (HOSPITAL_COMMUNITY): Payer: Self-pay | Admitting: Emergency Medicine

## 2019-08-30 DIAGNOSIS — R531 Weakness: Secondary | ICD-10-CM | POA: Insufficient documentation

## 2019-08-30 DIAGNOSIS — R55 Syncope and collapse: Secondary | ICD-10-CM | POA: Diagnosis present

## 2019-08-30 DIAGNOSIS — Z7982 Long term (current) use of aspirin: Secondary | ICD-10-CM | POA: Diagnosis not present

## 2019-08-30 DIAGNOSIS — Z79899 Other long term (current) drug therapy: Secondary | ICD-10-CM | POA: Diagnosis not present

## 2019-08-30 DIAGNOSIS — Z7984 Long term (current) use of oral hypoglycemic drugs: Secondary | ICD-10-CM | POA: Insufficient documentation

## 2019-08-30 DIAGNOSIS — E119 Type 2 diabetes mellitus without complications: Secondary | ICD-10-CM | POA: Diagnosis not present

## 2019-08-30 LAB — CBG MONITORING, ED: Glucose-Capillary: 149 mg/dL — ABNORMAL HIGH (ref 70–99)

## 2019-08-30 LAB — CBC
HCT: 42.8 % (ref 39.0–52.0)
Hemoglobin: 14 g/dL (ref 13.0–17.0)
MCH: 31.8 pg (ref 26.0–34.0)
MCHC: 32.7 g/dL (ref 30.0–36.0)
MCV: 97.3 fL (ref 80.0–100.0)
Platelets: 255 10*3/uL (ref 150–400)
RBC: 4.4 MIL/uL (ref 4.22–5.81)
RDW: 12.7 % (ref 11.5–15.5)
WBC: 9.1 10*3/uL (ref 4.0–10.5)
nRBC: 0 % (ref 0.0–0.2)

## 2019-08-30 LAB — BASIC METABOLIC PANEL
Anion gap: 12 (ref 5–15)
BUN: 12 mg/dL (ref 8–23)
CO2: 23 mmol/L (ref 22–32)
Calcium: 9.1 mg/dL (ref 8.9–10.3)
Chloride: 107 mmol/L (ref 98–111)
Creatinine, Ser: 1.05 mg/dL (ref 0.61–1.24)
GFR calc Af Amer: >60 mL/min (ref >60)
GFR calc non Af Amer: >60 mL/min (ref >60)
Glucose, Bld: 138 mg/dL — ABNORMAL HIGH (ref 70–99)
Potassium: 4.4 mmol/L (ref 3.5–5.1)
Sodium: 142 mmol/L (ref 135–145)

## 2019-08-30 MED ORDER — SODIUM CHLORIDE 0.9 % IV BOLUS
1000.0000 mL | Freq: Once | INTRAVENOUS | Status: AC
  Administered 2019-08-30: 10:00:00 1000 mL via INTRAVENOUS

## 2019-08-30 MED ORDER — SODIUM CHLORIDE 0.9% FLUSH: 3.0000 mL | Freq: Once | INTRAVENOUS | Status: DC

## 2019-08-30 NOTE — Discharge Instructions (Signed)
Be sure to drink plenty of fluids and rest. Follow up with your primary care physician

## 2019-08-30 NOTE — ED Triage Notes (Signed)
Pt arrives from home via [facility] for c/o dizziness and generalized weakness, states symptoms started during the day yesterday. No neuro deficits. A/ox4. Received 4mg  zofran pta, ems 12 lead unremarkable.

## 2019-08-30 NOTE — ED Provider Notes (Signed)
[facility] EMERGENCY DEPARTMENT Provider Note   CSN: [id number] Arrival date & time: 08/30/19  3267     History   Chief Complaint Chief Complaint  Patient presents with  . Dizziness  . Weakness    HPI Fred [name] is a 78 y.o. male.     HPI  78 year old male presents with near syncope.  He woke up feeling fine.  Wife endorses that he felt not great yesterday but he states it was pretty nonspecific and brief and nothing like today.  He usually goes for a run and he started on his run but after 10 minutes he started to feel acutely lightheaded and nauseous.  He thought he would pass out.  He had to go back to his car and then had to pull over because he might pass out and was dry heaving.  Eventually did make it back home and then vomited after my wife gave him orange juice.  His lightheadedness is significantly better but still a little lightheaded.  No headache, blurry vision or double vision, chest pain, shortness of breath.  No previous vomiting or diarrhea.  He normally does not eat before his run and did not today.  He took his insulin today but did not take any of his oral medicines.  Has had vertigo before and this felt more like he was going to pass out.  Past Medical History:  Diagnosis Date  . Diabetes mellitus without complication (HCC)   . High cholesterol   . Vertigo     There are no active problems to display for this patient.   Past Surgical History:  Procedure Laterality Date  . FOOT SURGERY    . KNEE SURGERY    . NASAL SEPTUM SURGERY          Home Medications    Prior to Admission medications   Medication Sig Start Date End Date Taking? Authorizing Provider  aspirin EC 81 MG tablet Take 81 mg by mouth daily.    [provider]  aspirin EC 81 MG tablet Take by mouth.    [provider]  Cholecalciferol (VITAMIN D) 2000 UNITS tablet Take 2,000 Units by mouth daily.    [provider]  cyclobenzaprine  (FLEXERIL) 5 MG tablet Take 5 mg by mouth 3 (three) times daily as needed for muscle spasms.    [provider]  fish oil-omega-3 fatty acids 1000 MG capsule Take 1 g by mouth 2 (two) times daily.    [provider]  glimepiride (AMARYL) 2 MG tablet Take 2 mg by mouth daily with breakfast.    [provider]  glimepiride (AMARYL) 2 MG tablet Take by mouth.    [provider]  HYDROcodone-acetaminophen (NORCO/VICODIN) 5-325 MG tablet Take 1-2 tablets by mouth every 4 (four) hours as needed. 02/25/19   Rolan Bucco, MD  Insulin Glargine (TOUJEO SOLOSTAR [place]) Inject into the skin.    [provider]  metFORMIN (GLUMETZA) 1000 MG (MOD) 24 hr tablet Take 1,000 mg by mouth 2 (two) times daily.    [provider]  metFORMIN (GLUMETZA) 1000 MG (MOD) 24 hr tablet Take by mouth.    [provider]  naproxen sodium (ALEVE) 220 MG tablet Take 220 mg by mouth daily as needed (for back pain).    [provider]  ondansetron (ZOFRAN ODT) 4 MG disintegrating tablet 4mg  ODT q4 hours prn nausea/vomit 02/25/19   02/27/19, MD  simvastatin (ZOCOR) 40 MG tablet Take  40 mg by mouth daily.    [provider]  simvastatin (ZOCOR) 40 MG tablet Take by mouth.    [provider]  solifenacin (VESICARE) 5 MG tablet Take by mouth.    [provider]    Family History No family history on file.  Social History Social History   Tobacco Use  . Smoking status: Never Smoker  . Smokeless tobacco: Never Used  Substance Use Topics  . Alcohol use: No  . Drug use: No     Allergies   Patient has no known allergies.   Review of Systems Review of Systems  Constitutional: Negative for fever.  Eyes: Negative for visual disturbance.  Respiratory: Negative for shortness of breath.   Cardiovascular: Negative for chest pain and leg swelling.  Gastrointestinal: Positive for vomiting. Negative for abdominal pain.   Genitourinary: Negative for dysuria.  Neurological: Positive for light-headedness. Negative for syncope and headaches.  All other systems reviewed and are negative.    Physical Exam Updated Vital Signs BP 131/69   Pulse 83   Temp 97.6 F (36.4 C)   Resp 14   SpO2 97%   Physical Exam Vitals signs and nursing note reviewed.  Constitutional:      Appearance: He is well-developed.  HENT:     Head: Normocephalic and atraumatic.     Right Ear: External ear normal.     Left Ear: External ear normal.     Nose: Nose normal.  Eyes:     General:        Right eye: No discharge.        Left eye: No discharge.  Neck:     Musculoskeletal: Neck supple.  Cardiovascular:     Rate and Rhythm: Normal rate and regular rhythm.     Heart sounds: Normal heart sounds. No murmur.  Pulmonary:     Effort: Pulmonary effort is normal.     Breath sounds: Normal breath sounds.  Abdominal:     Palpations: Abdomen is soft.     Tenderness: There is no abdominal tenderness.  Skin:    General: Skin is warm and dry.  Neurological:     Mental Status: He is alert.     Comments: CN 3-12 grossly intact. 5/5 strength in all 4 extremities. Grossly normal sensation. Normal finger to nose.   Psychiatric:        Mood and Affect: Mood is not anxious.      ED Treatments / Results  Labs (all labs ordered are listed, but only abnormal results are displayed) Labs Reviewed  BASIC METABOLIC PANEL - Abnormal; Notable for the following components:      Result Value   Glucose, Bld 138 (*)    All other components within normal limits  CBG MONITORING, ED - Abnormal; Notable for the following components:   Glucose-Capillary 149 (*)    All other components within normal limits  CBC  URINALYSIS, ROUTINE W REFLEX MICROSCOPIC    EKG EKG Interpretation  Date/Time:  Monday August 30 2019 09:02:27 EDT Ventricular Rate:  66 PR Interval:  156 QRS Duration: 88 QT Interval:  434 QTC Calculation: 454 R Axis:    59 Text Interpretation:  Sinus rhythm with occasional Premature ventricular complexes no acute St/T changes similar to Dec 2014 Confirmed by Sherwood Gambler [phone]) on 08/30/2019 9:21:37 AM   Radiology No results found.  Procedures Procedures (including critical care time)  Medications Ordered in ED Medications  sodium chloride flush (NS) 0.9 % injection 3  mL (3 mLs Intravenous Not Given 08/30/19 1059)  sodium chloride 0.9 % bolus 1,000 mL (1,000 mLs Intravenous New Bag/Given 08/30/19 0957)     Initial Impression / Assessment and Plan / ED Course  I have reviewed the triage vital signs and the nursing notes.  Pertinent labs & imaging results that were available during my care of the patient were reviewed by me and considered in my medical decision making (see chart for details).        Patient's vital signs are stable.  His near syncope appears to be related to the exercise.  My suspicion is with no murmur or other acute findings on exam or work-up that this was likely from poor conditioning or dehydration.  I recommend that he take it slower next time, drink plenty of fluids.  I think it is reasonable to get an outpatient work-up but I do not think he had obstructive cardiac disease or significant arrhythmia.  Discharge home with return precautions.  Final Clinical Impressions(s) / ED Diagnoses   Final diagnoses:  Near syncope    ED Discharge Orders    None       Pricilla Loveless, MD 08/30/19 1136

## 2019-09-07 ENCOUNTER — Other Ambulatory Visit: Payer: Self-pay

## 2019-09-07 ENCOUNTER — Ambulatory Visit (INDEPENDENT_AMBULATORY_CARE_PROVIDER_SITE_OTHER): Payer: Medicare Other | Admitting: Cardiovascular Disease

## 2019-09-07 ENCOUNTER — Encounter: Payer: Self-pay | Admitting: Cardiovascular Disease

## 2019-09-07 VITALS — BP 112/68 | HR 80 | Temp 96.4°F | Ht 67.0 in | Wt 204.0 lb

## 2019-09-07 DIAGNOSIS — E782 Mixed hyperlipidemia: Secondary | ICD-10-CM | POA: Diagnosis not present

## 2019-09-07 DIAGNOSIS — Z8249 Family history of ischemic heart disease and other diseases of the circulatory system: Secondary | ICD-10-CM | POA: Diagnosis not present

## 2019-09-07 DIAGNOSIS — R55 Syncope and collapse: Secondary | ICD-10-CM

## 2019-09-07 DIAGNOSIS — R42 Dizziness and giddiness: Secondary | ICD-10-CM | POA: Diagnosis not present

## 2019-09-07 DIAGNOSIS — E785 Hyperlipidemia, unspecified: Secondary | ICD-10-CM | POA: Insufficient documentation

## 2019-09-07 MED ORDER — METOPROLOL TARTRATE 50 MG PO TABS: 50.0000 mg | ORAL_TABLET | Freq: Once | ORAL | 0 refills | Status: DC

## 2019-09-07 NOTE — Progress Notes (Signed)
09/07/2019 Fred [name]   [birth year]  [id number]  Primary Physician [name], MD Primary Cardiologist: Runell Gess MD Nicholes Calamity, [place]  HPI:  Fred [name] is a 78 y.o. mildly overweight weight married Caucasian male father of 2, grandfather of 4 grandchildren who is referred by the ER and Dr. Tiburcio Pea for evaluation of dizziness.  He retired in 2000 from working at [employer] in [place].  His cardiac risk factors include treated diabetes and hyperlipidemia.  He does not smoke.  He does have a family history of heart disease with a mother that had CABG and a younger brother who died as well which was found in the evaluation of syncope.  He is never had a heart attack or stroke.  He denies chest pain or shortness of breath.  He is fairly active and routinely walked 3 miles a day up until COVID-19 when he became more sedentary however recently he is try to increase his activity.  On 08/30/2019 he went out for his morning run of 3 miles and at the end he felt weak.  When driving back home he thought that he may have passed out but he was dizzy.  He went to the ER where he was nauseated as well.  ER evaluation was unrevealing.  He said he felt a little bit of chest discomfort this morning when coming to the office but otherwise specifically denied the symptoms.   Current Meds  Medication Sig  . aspirin EC 81 MG tablet Take by mouth.  Marland Kitchen glimepiride (AMARYL) 2 MG tablet Take by mouth.  . Insulin Glargine (TOUJEO SOLOSTAR [place]) Inject into the skin.  . metFORMIN (GLUMETZA) 1000 MG (MOD) 24 hr tablet Take by mouth.  . naproxen sodium (ALEVE) 220 MG tablet Take 220 mg by mouth daily as needed (for back pain).  Marland Kitchen solifenacin (VESICARE) 5 MG tablet Take by mouth.     No Known Allergies  Social History   Socioeconomic History  . Marital status: Married    Spouse name: Not on file  . Number of children: Not on file  . Years of education: Not on file   . Highest education level: Not on file  Occupational History  . Not on file  Social Needs  . Financial resource strain: Not on file  . Food insecurity    Worry: Not on file    Inability: Not on file  . Transportation needs    Medical: Not on file    Non-medical: Not on file  Tobacco Use  . Smoking status: Never Smoker  . Smokeless tobacco: Never Used  Substance and Sexual Activity  . Alcohol use: No  . Drug use: No  . Sexual activity: Not on file  Lifestyle  . Physical activity    Days per week: Not on file    Minutes per session: Not on file  . Stress: Not on file  Relationships  . Social Musician on phone: Not on file    Gets together: Not on file    Attends religious service: Not on file    Active member of club or organization: Not on file    Attends meetings of clubs or organizations: Not on file    Relationship status: Not on file  . Intimate partner violence    Fear of current or ex partner: Not on file    Emotionally abused: Not on file    Physically abused: Not  on file    Forced sexual activity: Not on file  Other Topics Concern  . Not on file  Social History Narrative  . Not on file     Review of Systems: General: negative for chills, fever, night sweats or weight changes.  Cardiovascular: negative for chest pain, dyspnea on exertion, edema, orthopnea, palpitations, paroxysmal nocturnal dyspnea or shortness of breath Dermatological: negative for rash Respiratory: negative for cough or wheezing Urologic: negative for hematuria Abdominal: negative for nausea, vomiting, diarrhea, bright red blood per rectum, melena, or hematemesis Neurologic: negative for visual changes, syncope, or dizziness All other systems reviewed and are otherwise negative except as noted above.    Blood pressure 112/68, pulse 80, temperature (!) 96.4 F (35.8 C), height 5\' 7"  (1.702 m), weight 204 lb (92.5 kg), SpO2 96 %.  General appearance: alert and no distress  Neck: no adenopathy, no carotid bruit, no JVD, supple, symmetrical, trachea midline and thyroid not enlarged, symmetric, no tenderness/mass/nodules Lungs: clear to auscultation bilaterally Heart: regular rate and rhythm, S1, S2 normal, no murmur, click, rub or gallop Extremities: extremities normal, atraumatic, no cyanosis or edema Pulses: 2+ and symmetric Skin: Skin color, texture, turgor normal. No rashes or lesions Neurologic: Alert and oriented X 3, normal strength and tone. Normal symmetric reflexes. Normal coordination and gait  EKG not performed today  ASSESSMENT AND PLAN:   Hyperlipidemia History of hyperlipidemia on statin therapy with lipid profile performed 06/07/2019 revealing total cholesterol 116, LDL of 60 and HDL 33.  Family history of heart disease [name] has a family history of heart disease with a mother that had CABG and a younger brother likewise but had CABG found in the evaluation of syncope.  Dizziness [name] was seen in the ER on 08/30/2019 with dizziness and nausea.  He was walking 3 miles a day routinely but had slacked off during COVID-19 and was trying to get back to his previous work level.  He felt fine in the morning prior to his run.  The end of his run he felt weak.  He got in the car and felt dizzy and said that he may have passed out.  He was seen in the emergency room where he was complaining of nausea.  He specifically denies chest pain or shortness of breath.  His work-up in the ER was unrevealing and the ER doctor felt that his symptoms were related to dehydration.  He does say that his younger brother had several episodes of syncope which ultimately led to a coronary CTA and CABG.  I am going to get a 2-week ZIO patch to rule out an arrhythmogenic cause as well as a coronary CTA to rule out an ischemically mediated cause.      Lorretta Harp MD FACP,FACC,FAHA, [facility] 09/07/2019 8:41 AM

## 2019-09-07 NOTE — Patient Instructions (Addendum)
CARDIAC CT: TO BE SCHEDULED  Your cardiac CT will be scheduled at one of the below locations:   [facility] [address] [place], [place] [address] (336) [place] [address] [place], [place] [address] [phone]  If scheduled at [facility], please arrive at the [facility] main entrance of [facility] 30-45 minutes prior to test start time. Proceed to the [facility] Radiology Department (first floor) to check-in and test prep.  If scheduled at [facility], please arrive 15 mins early for check-in and test prep.  Please follow these instructions carefully (unless otherwise directed):  Hold all erectile dysfunction medications at least 3 days (72 hrs) prior to test.  On the Night Before the Test: . Be sure to Drink plenty of water. . Do not consume any caffeinated/decaffeinated beverages or chocolate 12 hours prior to your test. . Do not take any antihistamines 12 hours prior to your test.  On the Day of the Test: . Drink plenty of water. Do not drink any water within one hour of the test. . Do not eat any food 4 hours prior to the test. . You may take your regular medications prior to the test.  . Take metoprolol (Lopressor) 50 MG two hours prior to test. . HOLD Furosemide/Hydrochlorothiazide morning of the test. . HOLD METFORMIN 24 HOURS PRIOR TO CORONARY CTA       After the Test: . Drink plenty of water. . After receiving IV contrast, you may experience a mild flushed feeling. This is normal. . On occasion, you may experience a mild rash up to 24 hours after the test. This is not dangerous. If this occurs, you can take Benadryl 25 mg and increase your fluid intake. . If you experience trouble breathing, this can be serious. If it is severe call 911 IMMEDIATELY. If it is mild, please call our office. . If you take any of these medications:  Glipizide/Metformin, Avandament, Glucavance, please do not take 48 hours after completing test unless otherwise instructed.    Please contact the cardiac imaging nurse navigator should you have any questions/concerns Marchia Bond, RN Navigator Cardiac Imaging [facility] [phone] Office  [phone] Cell    ____________________________________________________________________  Testing/Procedures: Your physician has recommended that you wear a 14 DAY ZIO-PATCH monitor. The Zio patch cardiac monitor continuously records heart rhythm data for up to 14 days, this is for patients being evaluated for multiple types heart rhythms. For the first 24 hours post application, please avoid getting the Zio monitor wet in the shower or by excessive sweating during exercise. After that, feel free to carry on with regular activities. Keep soaps and lotions away from the ZIO XT Patch.  This will be placed at our [facility] location - [address], [address] or mailed to you.         Follow-Up: At [facility], you and your health needs are our priority.  As part of our continuing mission to provide you with exceptional heart care, we have created designated Provider Care Teams.  These Care Teams include your primary Cardiologist (physician) and Advanced Practice Providers (APPs -  Physician Assistants and Nurse Practitioners) who all work together to provide you with the care you need, when you need it. You will need a follow up appointment with Dr. Quay Burow AS NEEDED unless your results are abnormal.

## 2019-09-07 NOTE — Assessment & Plan Note (Signed)
Fred [name] was seen in the ER on 08/30/2019 with dizziness and nausea.  He was walking 3 miles a day routinely but had slacked off during COVID-19 and was trying to get back to his previous work level.  He felt fine in the morning prior to his run.  The end of his run he felt weak.  He got in the car and felt dizzy and said that he may have passed out.  He was seen in the emergency room where he was complaining of nausea.  He specifically denies chest pain or shortness of breath.  His work-up in the ER was unrevealing and the ER doctor felt that his symptoms were related to dehydration.  He does say that his younger brother had several episodes of syncope which ultimately led to a coronary CTA and CABG.  I am going to get a 2-week ZIO patch to rule out an arrhythmogenic cause as well as a coronary CTA to rule out an ischemically mediated cause.

## 2019-09-07 NOTE — Assessment & Plan Note (Signed)
Fred [name] has a family history of heart disease with a mother that had CABG and a younger brother likewise but had CABG found in the evaluation of syncope.

## 2019-09-07 NOTE — Assessment & Plan Note (Signed)
History of hyperlipidemia on statin therapy with lipid profile performed 06/07/2019 revealing total cholesterol 116, LDL of 60 and HDL 33.

## 2019-09-08 ENCOUNTER — Other Ambulatory Visit: Payer: Self-pay | Admitting: Cardiovascular Disease

## 2019-09-08 ENCOUNTER — Telehealth: Payer: Self-pay

## 2019-09-08 NOTE — Telephone Encounter (Signed)
Spoke to pt. Went over brief instructions for his monitor. Verified address. Ordered 14 day ZIO to be delivered to pt's home.

## 2019-09-13 ENCOUNTER — Inpatient Hospital Stay (INDEPENDENT_AMBULATORY_CARE_PROVIDER_SITE_OTHER): Payer: Medicare Other

## 2019-09-13 DIAGNOSIS — Z8249 Family history of ischemic heart disease and other diseases of the circulatory system: Secondary | ICD-10-CM | POA: Diagnosis not present

## 2019-09-13 DIAGNOSIS — R55 Syncope and collapse: Secondary | ICD-10-CM

## 2019-09-20 ENCOUNTER — Telehealth (HOSPITAL_COMMUNITY): Payer: Self-pay | Admitting: Emergency Medicine

## 2019-09-20 NOTE — Telephone Encounter (Signed)
Pt calling regarding upcoming cardiac imaging study; pt verbalizes understanding of appt date/time, parking situation and where to check in, pre-test NPO status and medications ordered, and verified current allergies; name and call back number provided for further questions should they arise Marchia Bond RN [place] [facility] [phone] office [phone] cell  Reviewed medications to take/ not to take prior to test- pt verbalized understanding Requested patient have a driver- pt verbalized understanding

## 2019-09-21 ENCOUNTER — Other Ambulatory Visit: Payer: Self-pay

## 2019-09-21 ENCOUNTER — Ambulatory Visit (HOSPITAL_COMMUNITY)
Admission: RE | Admit: 2019-09-21 | Discharge: 2019-09-21 | Disposition: A | Discharge status: Home / Self Care | Payer: Medicare Other | Source: Ambulatory Visit | Attending: Cardiovascular Disease | Admitting: Cardiovascular Disease

## 2019-09-21 DIAGNOSIS — Z79899 Other long term (current) drug therapy: Secondary | ICD-10-CM | POA: Insufficient documentation

## 2019-09-21 DIAGNOSIS — Z8249 Family history of ischemic heart disease and other diseases of the circulatory system: Secondary | ICD-10-CM

## 2019-09-21 DIAGNOSIS — R55 Syncope and collapse: Secondary | ICD-10-CM | POA: Diagnosis not present

## 2019-09-21 DIAGNOSIS — E785 Hyperlipidemia, unspecified: Secondary | ICD-10-CM | POA: Diagnosis not present

## 2019-09-21 DIAGNOSIS — R42 Dizziness and giddiness: Secondary | ICD-10-CM | POA: Diagnosis not present

## 2019-09-21 DIAGNOSIS — I2510 Atherosclerotic heart disease of native coronary artery without angina pectoris: Secondary | ICD-10-CM | POA: Diagnosis not present

## 2019-09-21 DIAGNOSIS — Z7982 Long term (current) use of aspirin: Secondary | ICD-10-CM | POA: Diagnosis not present

## 2019-09-21 MED ORDER — NITROGLYCERIN 0.4 MG SL SUBL
SUBLINGUAL_TABLET | SUBLINGUAL | Status: AC
  Filled 2019-09-21: qty 2

## 2019-09-21 MED ORDER — NITROGLYCERIN 0.4 MG SL SUBL
0.8000 mg | SUBLINGUAL_TABLET | Freq: Once | SUBLINGUAL | Status: AC
  Administered 2019-09-21: 0.8 mg via SUBLINGUAL
  Filled 2019-09-21: qty 25

## 2019-09-21 MED ORDER — IOHEXOL 350 MG/ML SOLN
80.0000 mL | Freq: Once | INTRAVENOUS | Status: AC | PRN
  Administered 2019-09-21: 16:00:00 80 mL via INTRAVENOUS

## 2019-10-02 DIAGNOSIS — I2510 Atherosclerotic heart disease of native coronary artery without angina pectoris: Secondary | ICD-10-CM | POA: Diagnosis not present

## 2019-10-05 ENCOUNTER — Ambulatory Visit (INDEPENDENT_AMBULATORY_CARE_PROVIDER_SITE_OTHER): Payer: Medicare Other | Admitting: Cardiovascular Disease

## 2019-10-05 ENCOUNTER — Other Ambulatory Visit: Payer: Self-pay

## 2019-10-05 ENCOUNTER — Encounter: Payer: Self-pay | Admitting: Cardiovascular Disease

## 2019-10-05 VITALS — BP 137/73 | HR 73 | Ht 67.0 in | Wt 207.6 lb

## 2019-10-05 DIAGNOSIS — I25118 Atherosclerotic heart disease of native coronary artery with other forms of angina pectoris: Secondary | ICD-10-CM

## 2019-10-05 DIAGNOSIS — Z01812 Encounter for preprocedural laboratory examination: Secondary | ICD-10-CM | POA: Diagnosis not present

## 2019-10-05 DIAGNOSIS — I2510 Atherosclerotic heart disease of native coronary artery without angina pectoris: Secondary | ICD-10-CM | POA: Insufficient documentation

## 2019-10-05 LAB — BASIC METABOLIC PANEL
BUN/Creatinine Ratio: 16 (ref 10–24)
BUN: 17 mg/dL (ref 8–27)
CO2: 24 mmol/L (ref 20–29)
Calcium: 9.7 mg/dL (ref 8.6–10.2)
Chloride: 99 mmol/L (ref 96–106)
Creatinine, Ser: 1.07 mg/dL (ref 0.76–1.27)
GFR calc Af Amer: 76 mL/min/{1.73_m2} (ref >59)
GFR calc non Af Amer: 66 mL/min/{1.73_m2} (ref >59)
Glucose: 201 mg/dL — ABNORMAL HIGH (ref 65–99)
Potassium: 4.5 mmol/L (ref 3.5–5.2)
Sodium: 137 mmol/L (ref 134–144)

## 2019-10-05 LAB — CBC
Hematocrit: 41.2 % (ref 37.5–51.0)
Hemoglobin: 14 g/dL (ref 13.0–17.7)
MCH: 30.8 pg (ref 26.6–33.0)
MCHC: 34 g/dL (ref 31.5–35.7)
MCV: 91 fL (ref 79–97)
Platelets: 239 10*3/uL (ref 150–450)
RBC: 4.54 x10E6/uL (ref 4.14–5.80)
RDW: 12.2 % (ref 11.6–15.4)
WBC: 9 10*3/uL (ref 3.4–10.8)

## 2019-10-05 LAB — TSH: TSH: 6.22 u[IU]/mL — ABNORMAL HIGH (ref 0.450–4.500)

## 2019-10-05 NOTE — H&P (View-Only) (Signed)
   10/05/2019 Fred [name]   06/18/1941  [id number]  Primary Physician Harris, William, MD Primary Cardiologist: Wyatte Dames J Paelyn Smick MD FACP, FACC, FAHA, FSCAI  HPI:  Fred [name] is a 78 y.o.  mildly overweight weight married Caucasian male father of 2, grandfather of 4 grandchildren who is referred by the ER and Dr. Harris for evaluation of dizziness.  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  I last saw him in the office 09/07/2019. His cardiac risk factors include treated diabetes and hyperlipidemia.  He does not smoke.  He does have a family history of heart disease with a mother that had CABG and a younger brother who died as well which was found in the evaluation of syncope.  He is never had a heart attack or stroke.  He denies chest pain or shortness of breath.  He is fairly active and routinely walked 3 miles a day up until COVID-19 when he became more sedentary however recently he is try to increase his activity.  On 08/30/2019 he went out for his morning run of 3 miles and at the end he felt weak.  When driving back home he thought that he may have passed out but he was dizzy.  He went to the ER where he was nauseated as well.  ER evaluation was unrevealing.  He said he felt a little bit of chest discomfort this morning when coming to the office but otherwise specifically denied the symptoms.  I performed event monitoring which is still processing.  A coronary CTA did show three-vessel disease.  Based on this, I recommended that we proceed with outpatient diagnostic coronary angiography.   Current Meds  Medication Sig  . aspirin EC 81 MG tablet Take by mouth.  . B-D UF III MINI PEN NEEDLES 31G X 5 MM MISC USE TO CHECK BLOOD SUGARS ONCE A DAY  . glimepiride (AMARYL) 2 MG tablet Take by mouth.  . glimepiride (AMARYL) 4 MG tablet   . Insulin Glargine (TOUJEO SOLOSTAR [place]) Inject into the skin.  . metFORMIN (GLUCOPHAGE) 1000 MG tablet   .  metFORMIN (GLUMETZA) 1000 MG (MOD) 24 hr tablet Take by mouth.  . metoprolol tartrate (LOPRESSOR) 50 MG tablet Take 1 tablet (50 mg total) by mouth once for 1 dose. TAKE ONE TABLET TWO HOURS PRIOR TO YOUR CORONARY CTA  . naproxen sodium (ALEVE) 220 MG tablet Take 220 mg by mouth daily as needed (for back pain).  . simvastatin (ZOCOR) 40 MG tablet Take by mouth.  . solifenacin (VESICARE) 5 MG tablet Take by mouth.  . solifenacin (VESICARE) 5 MG tablet   . TOUJEO MAX SOLOSTAR 300 UNIT/ML SOPN      No Known Allergies  Social History   Socioeconomic History  . Marital status: Married    Spouse name: Not on file  . Number of children: Not on file  . Years of education: Not on file  . Highest education level: Not on file  Occupational History  . Not on file  Social Needs  . Financial resource strain: Not on file  . Food insecurity    Worry: Not on file    Inability: Not on file  . Transportation needs    Medical: Not on file    Non-medical: Not on file  Tobacco Use  . Smoking status: Never Smoker  . Smokeless tobacco: Never Used  Substance and Sexual Activity  . Alcohol use: No  . Drug   use: No  . Sexual activity: Not on file  Lifestyle  . Physical activity    Days per week: Not on file    Minutes per session: Not on file  . Stress: Not on file  Relationships  . Social Herbalist on phone: Not on file    Gets together: Not on file    Attends religious service: Not on file    Active member of club or organization: Not on file    Attends meetings of clubs or organizations: Not on file    Relationship status: Not on file  . Intimate partner violence    Fear of current or ex partner: Not on file    Emotionally abused: Not on file    Physically abused: Not on file    Forced sexual activity: Not on file  Other Topics Concern  . Not on file  Social History Narrative  . Not on file     Review of Systems: General: negative for chills, fever, night sweats or  weight changes.  Cardiovascular: negative for chest pain, dyspnea on exertion, edema, orthopnea, palpitations, paroxysmal nocturnal dyspnea or shortness of breath Dermatological: negative for rash Respiratory: negative for cough or wheezing Urologic: negative for hematuria Abdominal: negative for nausea, vomiting, diarrhea, bright red blood per rectum, melena, or hematemesis Neurologic: negative for visual changes, syncope, or dizziness All other systems reviewed and are otherwise negative except as noted above.    Blood pressure 137/73, pulse 73, height 5\' 7"  (1.702 m), weight 207 lb 9.6 oz (94.2 kg), SpO2 98 %.  General appearance: alert and no distress Neck: no adenopathy, no carotid bruit, no JVD, supple, symmetrical, trachea midline and thyroid not enlarged, symmetric, no tenderness/mass/nodules Lungs: clear to auscultation bilaterally Heart: regular rate and rhythm, S1, S2 normal, no murmur, click, rub or gallop Extremities: extremities normal, atraumatic, no cyanosis or edema Pulses: 2+ and symmetric Skin: Skin color, texture, turgor normal. No rashes or lesions Neurologic: Alert and oriented X 3, normal strength and tone. Normal symmetric reflexes. Normal coordination and gait  EKG not performed today  ASSESSMENT AND PLAN:   Coronary artery disease Fred [name] returns today for follow-up of his coronary CTA performed 10/01/2019 and read by Dr. Marlou Porch with three-vessel disease.  He does have positive family history for heart disease as well as treated diabetes and hyperlipidemia.  He has been having some chest discomfort and was seen in the ER for nausea and weakness after running 3 miles..  Based on his coronary CTA I recommended that we proceed with outpatient diagnostic coronary angiography via the right radial approach. The patient understands that risks included but are not limited to stroke (1 in 1000), death (1 in 47), kidney failure [usually temporary] (1 in 500), bleeding  (1 in 200), allergic reaction [possibly serious] (1 in 200). The patient understands and agrees to proceed      Lorretta Harp MD [facility], [facility] 10/05/2019 10:34 AM

## 2019-10-05 NOTE — Progress Notes (Signed)
10/05/2019 Fred [name]   1940-12-20  [id number]  Primary Physician [name], MD Primary Cardiologist: Runell Gess MD Nicholes Calamity, [place]  HPI:  Fred [name] is a 78 y.o.  mildly overweight weight married Caucasian male father of 2, grandfather of 4 grandchildren who is referred by the ER and Dr. Tiburcio Pea for evaluation of dizziness.  He retired in 2000 from working at [employer] in [place].  I last saw him in the office 09/07/2019. His cardiac risk factors include treated diabetes and hyperlipidemia.  He does not smoke.  He does have a family history of heart disease with a mother that had CABG and a younger brother who died as well which was found in the evaluation of syncope.  He is never had a heart attack or stroke.  He denies chest pain or shortness of breath.  He is fairly active and routinely walked 3 miles a day up until COVID-19 when he became more sedentary however recently he is try to increase his activity.  On 08/30/2019 he went out for his morning run of 3 miles and at the end he felt weak.  When driving back home he thought that he may have passed out but he was dizzy.  He went to the ER where he was nauseated as well.  ER evaluation was unrevealing.  He said he felt a little bit of chest discomfort this morning when coming to the office but otherwise specifically denied the symptoms.  I performed event monitoring which is still processing.  A coronary CTA did show three-vessel disease.  Based on this, I recommended that we proceed with outpatient diagnostic coronary angiography.   Current Meds  Medication Sig  . aspirin EC 81 MG tablet Take by mouth.  . B-D UF III MINI PEN NEEDLES 31G X 5 MM MISC USE TO CHECK BLOOD SUGARS ONCE A DAY  . glimepiride (AMARYL) 2 MG tablet Take by mouth.  Marland Kitchen glimepiride (AMARYL) 4 MG tablet   . Insulin Glargine (TOUJEO SOLOSTAR [place]) Inject into the skin.  . metFORMIN (GLUCOPHAGE) 1000 MG tablet   .  metFORMIN (GLUMETZA) 1000 MG (MOD) 24 hr tablet Take by mouth.  . metoprolol tartrate (LOPRESSOR) 50 MG tablet Take 1 tablet (50 mg total) by mouth once for 1 dose. TAKE ONE TABLET TWO HOURS PRIOR TO YOUR CORONARY CTA  . naproxen sodium (ALEVE) 220 MG tablet Take 220 mg by mouth daily as needed (for back pain).  . simvastatin (ZOCOR) 40 MG tablet Take by mouth.  . solifenacin (VESICARE) 5 MG tablet Take by mouth.  . solifenacin (VESICARE) 5 MG tablet   . TOUJEO MAX SOLOSTAR 300 UNIT/ML SOPN      No Known Allergies  Social History   Socioeconomic History  . Marital status: Married    Spouse name: Not on file  . Number of children: Not on file  . Years of education: Not on file  . Highest education level: Not on file  Occupational History  . Not on file  Social Needs  . Financial resource strain: Not on file  . Food insecurity    Worry: Not on file    Inability: Not on file  . Transportation needs    Medical: Not on file    Non-medical: Not on file  Tobacco Use  . Smoking status: Never Smoker  . Smokeless tobacco: Never Used  Substance and Sexual Activity  . Alcohol use: No  . Drug  use: No  . Sexual activity: Not on file  Lifestyle  . Physical activity    Days per week: Not on file    Minutes per session: Not on file  . Stress: Not on file  Relationships  . Social Herbalist on phone: Not on file    Gets together: Not on file    Attends religious service: Not on file    Active member of club or organization: Not on file    Attends meetings of clubs or organizations: Not on file    Relationship status: Not on file  . Intimate partner violence    Fear of current or ex partner: Not on file    Emotionally abused: Not on file    Physically abused: Not on file    Forced sexual activity: Not on file  Other Topics Concern  . Not on file  Social History Narrative  . Not on file     Review of Systems: General: negative for chills, fever, night sweats or  weight changes.  Cardiovascular: negative for chest pain, dyspnea on exertion, edema, orthopnea, palpitations, paroxysmal nocturnal dyspnea or shortness of breath Dermatological: negative for rash Respiratory: negative for cough or wheezing Urologic: negative for hematuria Abdominal: negative for nausea, vomiting, diarrhea, bright red blood per rectum, melena, or hematemesis Neurologic: negative for visual changes, syncope, or dizziness All other systems reviewed and are otherwise negative except as noted above.    Blood pressure 137/73, pulse 73, height 5\' 7"  (1.702 m), weight 207 lb 9.6 oz (94.2 kg), SpO2 98 %.  General appearance: alert and no distress Neck: no adenopathy, no carotid bruit, no JVD, supple, symmetrical, trachea midline and thyroid not enlarged, symmetric, no tenderness/mass/nodules Lungs: clear to auscultation bilaterally Heart: regular rate and rhythm, S1, S2 normal, no murmur, click, rub or gallop Extremities: extremities normal, atraumatic, no cyanosis or edema Pulses: 2+ and symmetric Skin: Skin color, texture, turgor normal. No rashes or lesions Neurologic: Alert and oriented X 3, normal strength and tone. Normal symmetric reflexes. Normal coordination and gait  EKG not performed today  ASSESSMENT AND PLAN:   Coronary artery disease [name] returns today for follow-up of his coronary CTA performed 10/01/2019 and read by Dr. Marlou Porch with three-vessel disease.  He does have positive family history for heart disease as well as treated diabetes and hyperlipidemia.  He has been having some chest discomfort and was seen in the ER for nausea and weakness after running 3 miles..  Based on his coronary CTA I recommended that we proceed with outpatient diagnostic coronary angiography via the right radial approach. The patient understands that risks included but are not limited to stroke (1 in 1000), death (1 in 47), kidney failure [usually temporary] (1 in 500), bleeding  (1 in 200), allergic reaction [possibly serious] (1 in 200). The patient understands and agrees to proceed      Lorretta Harp MD [facility], [facility] 10/05/2019 10:34 AM

## 2019-10-05 NOTE — Patient Instructions (Addendum)
[facility] CARDIOVASCULAR DIVISION [facility] [address] [place] [address] Dept: [phone] Loc: [phone]  Fred [name]  10/05/2019  You are scheduled for a Cardiac Catheterization on Monday, November 2 with Dr. Nanetta Batty.  1. Please arrive at the [facility] (Main Entrance A) at [facility]: [address] [place], [place] [address] at 9:30 am (This time is two hours before your procedure to ensure your preparation). Free valet parking service is available.   Special note: Every effort is made to have your procedure done on time. Please understand that emergencies sometimes delay scheduled procedures.  2. Diet: Do not eat solid foods after midnight.  The patient may have clear liquids until 5am upon the day of the procedure.  3. Labs:  You will need to have blood drawn TODAY: Go to:  [facility] at [facility] [address], [place], [place] [address] FOR YOUR BASIC METABOLIC PANEL, COMPLETE BLOOD COUNT, AND THYROID STIMULATING HORMONE LAB WORK. NO APPOINTMENT IS NEEDED. YOU MUST HAVE THIS LAB WORK DONE BEFORE GOING TO GET YOUR COVID-19 TEST DONE BECAUSE YOU WILL NEED TO SELF-ISOLATE AFTER THE COVID-19 TEST UNTIL THE DAY OF YOUR PROCEDURE/TEST.  You will need a COVID-19 test on 10/07/2019. Your appointment is at 10:00am Go to: [facility] [address] [place], [place] [address] FOR YOUR COVID-19 TEST. YOU MUST HAVE YOUR COVID-19 TEST COMPLETED 4 DAYS PRIOR TO YOUR UPCOMING PROCEDURE/TEST. YOU WILL ALSO NEED TO SELF-ISOLATE AFTER THE COVID-19 TEST UNTIL THE DAY OF YOUR PROCEDURE/TEST. PLEASE BRING YOUR I.D. AND YOUR INSURANCE CARD(S) WITH YOU.   4. Medication instructions in preparation for your procedure: Take only HALF THE DOSE units of your Toujeo insulin the night before your procedure. Do not take any insulin on the day of the procedure.  Do not  take Diabetes Med Glucophage (Metformin) on the day of the procedure and HOLD 48 HOURS AFTER THE PROCEDURE.   Hold your glimepiride on the morning of your procedure.  On the morning of your procedure, take your Aspirin and any morning medicines NOT listed above.  You may use sips of water.  5. Plan for one night stay--bring personal belongings. 6. Bring a current list of your medications and current insurance cards. 7. You MUST have a responsible person to drive you home. 8. Someone MUST be with you the first 24 hours after you arrive home or your discharge will be delayed. 9. Please wear clothes that are easy to get on and off and wear slip-on shoes. ____________________________________________________________  Testing/Procedures: Your physician has requested that you have an echocardiogram. Echocardiography is a painless test that uses sound waves to create images of your heart. It provides your doctor with information about the size and shape of your heart and how well your heart's chambers and valves are working. This procedure takes approximately one hour. There are no restrictions for this procedure. LOCATION: LOCATION: [facility] at [facility]: [address], [place], [place] [address]  ~DUE THIS WEEK ~  TO BE SCHEDULED    Follow-Up: At [facility], you and your health needs are our priority.  As part of our continuing mission to provide you with exceptional heart care, we have created designated Provider Care Teams.  These Care Teams include your primary Cardiologist (physician) and Advanced Practice Providers (APPs -  Physician Assistants and Nurse Practitioners) who all work together to  provide you with the care you need, when you need it. . You will need a follow up appointment with Dr. Quay Burow AFTER YOUR PROCEDURE.  TO BE SCHEDULED

## 2019-10-05 NOTE — Assessment & Plan Note (Signed)
Fred [name] returns today for follow-up of his coronary CTA performed 10/01/2019 and read by Dr. Marlou Porch with three-vessel disease.  He does have positive family history for heart disease as well as treated diabetes and hyperlipidemia.  He has been having some chest discomfort and was seen in the ER for nausea and weakness after running 3 miles..  Based on his coronary CTA I recommended that we proceed with outpatient diagnostic coronary angiography via the right radial approach. The patient understands that risks included but are not limited to stroke (1 in 1000), death (1 in 73), kidney failure [usually temporary] (1 in 500), bleeding (1 in 200), allergic reaction [possibly serious] (1 in 200). The patient understands and agrees to proceed

## 2019-10-06 ENCOUNTER — Ambulatory Visit (HOSPITAL_COMMUNITY): Payer: Medicare Other | Attending: Cardiovascular Disease

## 2019-10-06 DIAGNOSIS — I25118 Atherosclerotic heart disease of native coronary artery with other forms of angina pectoris: Secondary | ICD-10-CM | POA: Insufficient documentation

## 2019-10-07 ENCOUNTER — Other Ambulatory Visit (HOSPITAL_COMMUNITY)
Admission: RE | Admit: 2019-10-07 | Discharge: 2019-10-07 | Disposition: A | Discharge status: Home / Self Care | Payer: Medicare Other | Source: Ambulatory Visit | Attending: Cardiovascular Disease | Admitting: Cardiovascular Disease

## 2019-10-07 ENCOUNTER — Telehealth: Payer: Self-pay | Admitting: *Deleted

## 2019-10-07 DIAGNOSIS — Z01812 Encounter for preprocedural laboratory examination: Secondary | ICD-10-CM | POA: Diagnosis present

## 2019-10-07 DIAGNOSIS — Z20828 Contact with and (suspected) exposure to other viral communicable diseases: Secondary | ICD-10-CM | POA: Diagnosis not present

## 2019-10-07 NOTE — Telephone Encounter (Signed)
Pt contacted pre-catheterization scheduled at [facility] for: Monday October 11, 2019 11:30 AM Verified arrival time and place: [place] [facility]) at: 9:30 AM   No solid food after midnight prior to cath, clear liquids until 5 AM day of procedure. Contrast allergy: no  Hold: Metformin-day of procedure and 48 hours post procedure. Insulin-AM of procedure. Pt states he does not take Insulin HS. Glimepiride-AM of procedure  Except hold medications AM meds can be  taken pre-cath with sip of water including: ASA 81 mg   Confirmed patient has responsible adult to drive home post procedure and observe 24 hours after arriving home: yes  Currently, due to Covid-19 pandemic, only one support person will be allowed with patient. Must be the same support person for that patient's entire stay, will be screened and required to wear a mask. They will be asked to wait in the waiting room for the duration of the patient's stay.  Patients are required to wear a mask when they enter the hospital.      COVID-19 Pre-Screening Questions:  . In the past 7 to 10 days have you had a cough,  shortness of breath, headache, congestion, fever (100 or greater) body aches, chills, sore throat, or sudden loss of taste or sense of smell? no . Have you been around anyone with known Covid 19? no . Have you been around anyone who is awaiting Covid 19 test results in the past 7 to 10 days? no . Have you been around anyone who has been exposed to Covid 19, or has mentioned symptoms of Covid 19 within the past 7 to 10 days? No  I reviewed procedure/mask/visitor, Covid-19 screening questions with patient, he verbalized understanding,thanked me for call.

## 2019-10-08 LAB — NOVEL CORONAVIRUS, NAA (HOSP ORDER, SEND-OUT TO REF LAB; TAT 18-24 HRS): SARS-CoV-2, NAA: NOT DETECTED

## 2019-10-11 ENCOUNTER — Inpatient Hospital Stay (HOSPITAL_COMMUNITY)
Admission: AD | Admit: 2019-10-11 | Discharge: 2019-10-20 | DRG: 234 | Disposition: A | Discharge status: Home Health | Payer: Medicare Other | Source: Ambulatory Visit | Attending: Cardiothoracic Surgery | Admitting: Cardiothoracic Surgery

## 2019-10-11 ENCOUNTER — Other Ambulatory Visit: Payer: Self-pay

## 2019-10-11 ENCOUNTER — Inpatient Hospital Stay (HOSPITAL_COMMUNITY): Payer: Medicare Other

## 2019-10-11 ENCOUNTER — Other Ambulatory Visit: Payer: Self-pay | Admitting: *Deleted

## 2019-10-11 ENCOUNTER — Encounter (HOSPITAL_COMMUNITY): Payer: Self-pay | Admitting: Cardiovascular Disease

## 2019-10-11 ENCOUNTER — Encounter (HOSPITAL_COMMUNITY)
Admission: AD | Disposition: A | Discharge status: Home Health | Payer: Self-pay | Source: Ambulatory Visit | Attending: Cardiothoracic Surgery

## 2019-10-11 DIAGNOSIS — E1165 Type 2 diabetes mellitus with hyperglycemia: Secondary | ICD-10-CM | POA: Diagnosis present

## 2019-10-11 DIAGNOSIS — Z79899 Other long term (current) drug therapy: Secondary | ICD-10-CM

## 2019-10-11 DIAGNOSIS — I25110 Atherosclerotic heart disease of native coronary artery with unstable angina pectoris: Secondary | ICD-10-CM | POA: Diagnosis present

## 2019-10-11 DIAGNOSIS — Z7982 Long term (current) use of aspirin: Secondary | ICD-10-CM

## 2019-10-11 DIAGNOSIS — D62 Acute posthemorrhagic anemia: Secondary | ICD-10-CM | POA: Diagnosis not present

## 2019-10-11 DIAGNOSIS — Z833 Family history of diabetes mellitus: Secondary | ICD-10-CM

## 2019-10-11 DIAGNOSIS — I25118 Atherosclerotic heart disease of native coronary artery with other forms of angina pectoris: Secondary | ICD-10-CM | POA: Diagnosis not present

## 2019-10-11 DIAGNOSIS — E7800 Pure hypercholesterolemia, unspecified: Secondary | ICD-10-CM | POA: Diagnosis present

## 2019-10-11 DIAGNOSIS — J9811 Atelectasis: Secondary | ICD-10-CM

## 2019-10-11 DIAGNOSIS — Z01810 Encounter for preprocedural cardiovascular examination: Secondary | ICD-10-CM

## 2019-10-11 DIAGNOSIS — D696 Thrombocytopenia, unspecified: Secondary | ICD-10-CM | POA: Diagnosis not present

## 2019-10-11 DIAGNOSIS — Z951 Presence of aortocoronary bypass graft: Secondary | ICD-10-CM

## 2019-10-11 DIAGNOSIS — R140 Abdominal distension (gaseous): Secondary | ICD-10-CM | POA: Diagnosis not present

## 2019-10-11 DIAGNOSIS — Z87891 Personal history of nicotine dependence: Secondary | ICD-10-CM

## 2019-10-11 DIAGNOSIS — Z8249 Family history of ischemic heart disease and other diseases of the circulatory system: Secondary | ICD-10-CM

## 2019-10-11 DIAGNOSIS — E8770 Fluid overload, unspecified: Secondary | ICD-10-CM | POA: Diagnosis not present

## 2019-10-11 DIAGNOSIS — I493 Ventricular premature depolarization: Secondary | ICD-10-CM | POA: Diagnosis not present

## 2019-10-11 DIAGNOSIS — E785 Hyperlipidemia, unspecified: Secondary | ICD-10-CM | POA: Diagnosis present

## 2019-10-11 DIAGNOSIS — J90 Pleural effusion, not elsewhere classified: Secondary | ICD-10-CM

## 2019-10-11 DIAGNOSIS — I2510 Atherosclerotic heart disease of native coronary artery without angina pectoris: Secondary | ICD-10-CM

## 2019-10-11 DIAGNOSIS — Z01811 Encounter for preprocedural respiratory examination: Secondary | ICD-10-CM

## 2019-10-11 DIAGNOSIS — Z794 Long term (current) use of insulin: Secondary | ICD-10-CM | POA: Diagnosis not present

## 2019-10-11 HISTORY — PX: LEFT HEART CATH AND CORONARY ANGIOGRAPHY: CATH118249

## 2019-10-11 LAB — GLUCOSE, CAPILLARY
Glucose-Capillary: 105 mg/dL — ABNORMAL HIGH (ref 70–99)
Glucose-Capillary: 87 mg/dL (ref 70–99)
Glucose-Capillary: 292 mg/dL — ABNORMAL HIGH (ref 70–99)
Glucose-Capillary: 162 mg/dL — ABNORMAL HIGH (ref 70–99)

## 2019-10-11 LAB — URINALYSIS, ROUTINE W REFLEX MICROSCOPIC
Bacteria, UA: NONE SEEN
Bilirubin Urine: NEGATIVE
Glucose, UA: >=500 mg/dL — AB
Hgb urine dipstick: NEGATIVE
Ketones, ur: NEGATIVE mg/dL
Leukocytes,Ua: NEGATIVE
Nitrite: NEGATIVE
Protein, ur: NEGATIVE mg/dL
Specific Gravity, Urine: 1.023 (ref 1.005–1.030)
pH: 5 (ref 5.0–8.0)

## 2019-10-11 LAB — BLOOD GAS, ARTERIAL
Acid-Base Excess: 1.9 mmol/L (ref 0.0–2.0)
Bicarbonate: 25.9 mmol/L (ref 20.0–28.0)
Drawn by: 519031
FIO2: 21
O2 Saturation: 96.1 %
Patient temperature: 37
pCO2 arterial: 40.3 mmHg (ref 32.0–48.0)
pH, Arterial: 7.423 (ref 7.350–7.450)
pO2, Arterial: 81.9 mmHg — ABNORMAL LOW (ref 83.0–108.0)

## 2019-10-11 LAB — APTT: aPTT: 30 seconds (ref 24–36)

## 2019-10-11 LAB — HEMOGLOBIN A1C
Hgb A1c MFr Bld: 9 % — ABNORMAL HIGH (ref 4.8–5.6)
Mean Plasma Glucose: 211.6 mg/dL

## 2019-10-11 LAB — SURGICAL PCR SCREEN
MRSA, PCR: NEGATIVE
Staphylococcus aureus: NEGATIVE

## 2019-10-11 LAB — PROTIME-INR
INR: 1.1 (ref 0.8–1.2)
Prothrombin Time: 14.4 seconds (ref 11.4–15.2)

## 2019-10-11 LAB — PREPARE RBC (CROSSMATCH)

## 2019-10-11 LAB — ABO/RH: ABO/RH(D): A POS

## 2019-10-11 SURGERY — LEFT HEART CATH AND CORONARY ANGIOGRAPHY
Anesthesia: LOCAL

## 2019-10-11 MED ORDER — MIDAZOLAM HCL 2 MG/2ML IJ SOLN
INTRAMUSCULAR | Status: DC | PRN
  Administered 2019-10-11: 1 mg via INTRAVENOUS

## 2019-10-11 MED ORDER — SODIUM CHLORIDE 0.9 % WEIGHT BASED INFUSION: 1.0000 mL/kg/h | INTRAVENOUS | Status: DC

## 2019-10-11 MED ORDER — TRANEXAMIC ACID (OHS) PUMP PRIME SOLUTION
2.0000 mg/kg | INTRAVENOUS | Status: DC
  Filled 2019-10-11: qty 1.79

## 2019-10-11 MED ORDER — HEPARIN (PORCINE) IN NACL 1000-0.9 UT/500ML-% IV SOLN
INTRAVENOUS | Status: DC | PRN
  Administered 2019-10-11: 500 mL

## 2019-10-11 MED ORDER — SODIUM CHLORIDE 0.9 % IV SOLN
1.5000 g | INTRAVENOUS | Status: AC
  Administered 2019-10-12: .75 g via INTRAVENOUS
  Administered 2019-10-12: 1.5 g via INTRAVENOUS
  Filled 2019-10-11: qty 1.5

## 2019-10-11 MED ORDER — TRANEXAMIC ACID 1000 MG/10ML IV SOLN
1.5000 mg/kg/h | INTRAVENOUS | Status: AC
  Administered 2019-10-12: 1.5 mg/kg/h via INTRAVENOUS
  Filled 2019-10-11: qty 25

## 2019-10-11 MED ORDER — PHENYLEPHRINE HCL-NACL 20-0.9 MG/250ML-% IV SOLN
30.0000 ug/min | INTRAVENOUS | Status: AC
  Administered 2019-10-12: 20 ug/min via INTRAVENOUS
  Filled 2019-10-11: qty 250

## 2019-10-11 MED ORDER — MIDAZOLAM HCL 2 MG/2ML IJ SOLN
INTRAMUSCULAR | Status: AC
  Filled 2019-10-11: qty 2

## 2019-10-11 MED ORDER — TRANEXAMIC ACID (OHS) BOLUS VIA INFUSION
15.0000 mg/kg | INTRAVENOUS | Status: AC
  Administered 2019-10-12: 1341 mg via INTRAVENOUS
  Filled 2019-10-11: qty 1341

## 2019-10-11 MED ORDER — PLASMA-LYTE 148 IV SOLN
INTRAVENOUS | Status: AC
  Administered 2019-10-12: 500 mL
  Filled 2019-10-11: qty 2.5

## 2019-10-11 MED ORDER — LIDOCAINE HCL (PF) 1 % IJ SOLN
INTRAMUSCULAR | Status: AC
  Filled 2019-10-11: qty 30

## 2019-10-11 MED ORDER — SODIUM CHLORIDE 0.9 % IV SOLN
INTRAVENOUS | Status: AC
  Administered 2019-10-11: 75 mL/h via INTRAVENOUS

## 2019-10-11 MED ORDER — METOPROLOL TARTRATE 12.5 MG HALF TABLET
12.5000 mg | ORAL_TABLET | Freq: Once | ORAL | Status: AC
  Administered 2019-10-12: 12.5 mg via ORAL
  Filled 2019-10-11: qty 1

## 2019-10-11 MED ORDER — ONDANSETRON HCL 4 MG/2ML IJ SOLN: 4.0000 mg | Freq: Four times a day (QID) | INTRAMUSCULAR | Status: DC | PRN

## 2019-10-11 MED ORDER — SODIUM CHLORIDE 0.9 % IV SOLN
750.0000 mg | INTRAVENOUS | Status: DC
  Filled 2019-10-11: qty 750

## 2019-10-11 MED ORDER — NITROGLYCERIN IN D5W 200-5 MCG/ML-% IV SOLN
2.0000 ug/min | INTRAVENOUS | Status: DC
  Filled 2019-10-11: qty 250

## 2019-10-11 MED ORDER — CHLORHEXIDINE GLUCONATE 4 % EX LIQD
60.0000 mL | Freq: Once | CUTANEOUS | Status: AC
  Administered 2019-10-11: 4 via TOPICAL
  Filled 2019-10-11: qty 60

## 2019-10-11 MED ORDER — HEPARIN SODIUM (PORCINE) 1000 UNIT/ML IJ SOLN
INTRAMUSCULAR | Status: AC
  Filled 2019-10-11: qty 1

## 2019-10-11 MED ORDER — INSULIN REGULAR(HUMAN) IN NACL 100-0.9 UT/100ML-% IV SOLN
INTRAVENOUS | Status: AC
  Administered 2019-10-12: 1.8 [IU]/h via INTRAVENOUS
  Filled 2019-10-11: qty 100

## 2019-10-11 MED ORDER — VANCOMYCIN HCL 10 G IV SOLR
1500.0000 mg | INTRAVENOUS | Status: AC
  Administered 2019-10-12: 1500 mg via INTRAVENOUS
  Filled 2019-10-11: qty 1500

## 2019-10-11 MED ORDER — ASPIRIN 81 MG PO CHEW: 81.0000 mg | CHEWABLE_TABLET | Freq: Every day | ORAL | Status: DC

## 2019-10-11 MED ORDER — SODIUM CHLORIDE 0.9% FLUSH: 3.0000 mL | INTRAVENOUS | Status: DC | PRN

## 2019-10-11 MED ORDER — MAGNESIUM SULFATE 50 % IJ SOLN
40.0000 meq | INTRAMUSCULAR | Status: DC
  Filled 2019-10-11: qty 9.85

## 2019-10-11 MED ORDER — TEMAZEPAM 15 MG PO CAPS
15.0000 mg | ORAL_CAPSULE | Freq: Once | ORAL | Status: AC | PRN
  Administered 2019-10-11: 15 mg via ORAL
  Filled 2019-10-11: qty 1

## 2019-10-11 MED ORDER — FENTANYL CITRATE (PF) 100 MCG/2ML IJ SOLN
INTRAMUSCULAR | Status: DC | PRN
  Administered 2019-10-11: 25 ug via INTRAVENOUS

## 2019-10-11 MED ORDER — NOREPINEPHRINE 4 MG/250ML-% IV SOLN
0.0000 ug/min | INTRAVENOUS | Status: DC
  Filled 2019-10-11: qty 250

## 2019-10-11 MED ORDER — HYDRALAZINE HCL 20 MG/ML IJ SOLN: 10.0000 mg | INTRAMUSCULAR | Status: AC | PRN

## 2019-10-11 MED ORDER — ALPRAZOLAM 0.25 MG PO TABS: 0.2500 mg | ORAL_TABLET | ORAL | Status: DC | PRN

## 2019-10-11 MED ORDER — IOHEXOL 350 MG/ML SOLN
INTRAVENOUS | Status: DC | PRN
  Administered 2019-10-11: 12:00:00 30 mL

## 2019-10-11 MED ORDER — INSULIN ASPART 100 UNIT/ML ~~LOC~~ SOLN
0.0000 [IU] | Freq: Three times a day (TID) | SUBCUTANEOUS | Status: DC
  Administered 2019-10-12: 2 [IU] via SUBCUTANEOUS

## 2019-10-11 MED ORDER — SODIUM CHLORIDE 0.9 % IV SOLN
INTRAVENOUS | Status: DC
  Filled 2019-10-11: qty 30

## 2019-10-11 MED ORDER — SIMVASTATIN 20 MG PO TABS
40.0000 mg | ORAL_TABLET | Freq: Every day | ORAL | Status: DC
  Administered 2019-10-11 – 2019-10-19 (×8): 40 mg via ORAL
  Filled 2019-10-11 (×8): qty 2

## 2019-10-11 MED ORDER — SODIUM CHLORIDE 0.9% FLUSH
3.0000 mL | Freq: Two times a day (BID) | INTRAVENOUS | Status: DC
  Administered 2019-10-11 (×2): 3 mL via INTRAVENOUS

## 2019-10-11 MED ORDER — POTASSIUM CHLORIDE 2 MEQ/ML IV SOLN
80.0000 meq | INTRAVENOUS | Status: DC
  Filled 2019-10-11: qty 40

## 2019-10-11 MED ORDER — VERAPAMIL HCL 2.5 MG/ML IV SOLN
INTRAVENOUS | Status: DC | PRN
  Administered 2019-10-11: 10 mL via INTRA_ARTERIAL

## 2019-10-11 MED ORDER — DEXMEDETOMIDINE HCL IN NACL 400 MCG/100ML IV SOLN
0.1000 ug/kg/h | INTRAVENOUS | Status: AC
  Administered 2019-10-12: 0.4 ug/kg/h via INTRAVENOUS
  Filled 2019-10-11: qty 100

## 2019-10-11 MED ORDER — MILRINONE LACTATE IN DEXTROSE 20-5 MG/100ML-% IV SOLN
0.3000 ug/kg/min | INTRAVENOUS | Status: AC
  Administered 2019-10-12: 0.25 ug/kg/min via INTRAVENOUS
  Filled 2019-10-11: qty 100

## 2019-10-11 MED ORDER — EPINEPHRINE HCL 5 MG/250ML IV SOLN IN NS
0.0000 ug/min | INTRAVENOUS | Status: DC
  Filled 2019-10-11: qty 250

## 2019-10-11 MED ORDER — SODIUM CHLORIDE 0.9% FLUSH
3.0000 mL | Freq: Two times a day (BID) | INTRAVENOUS | Status: DC
  Administered 2019-10-11: 3 mL via INTRAVENOUS

## 2019-10-11 MED ORDER — SODIUM CHLORIDE 0.9 % WEIGHT BASED INFUSION
3.0000 mL/kg/h | INTRAVENOUS | Status: DC
  Administered 2019-10-11: 3 mL/kg/h via INTRAVENOUS

## 2019-10-11 MED ORDER — GLIMEPIRIDE 4 MG PO TABS
4.0000 mg | ORAL_TABLET | Freq: Two times a day (BID) | ORAL | Status: DC
  Administered 2019-10-11: 4 mg via ORAL
  Filled 2019-10-11 (×3): qty 1

## 2019-10-11 MED ORDER — BISACODYL 5 MG PO TBEC
5.0000 mg | DELAYED_RELEASE_TABLET | Freq: Once | ORAL | Status: AC
  Administered 2019-10-11: 5 mg via ORAL
  Filled 2019-10-11: qty 1

## 2019-10-11 MED ORDER — ACETAMINOPHEN 325 MG PO TABS: 650.0000 mg | ORAL_TABLET | ORAL | Status: DC | PRN

## 2019-10-11 MED ORDER — LIDOCAINE HCL (PF) 1 % IJ SOLN
INTRAMUSCULAR | Status: DC | PRN
  Administered 2019-10-11: 1 mL

## 2019-10-11 MED ORDER — VERAPAMIL HCL 2.5 MG/ML IV SOLN
INTRAVENOUS | Status: AC
  Filled 2019-10-11: qty 2

## 2019-10-11 MED ORDER — HEPARIN SODIUM (PORCINE) 1000 UNIT/ML IJ SOLN
INTRAMUSCULAR | Status: DC | PRN
  Administered 2019-10-11: 4500 [IU] via INTRAVENOUS

## 2019-10-11 MED ORDER — INSULIN GLARGINE 100 UNIT/ML ~~LOC~~ SOLN
60.0000 [IU] | Freq: Every day | SUBCUTANEOUS | Status: DC
  Administered 2019-10-11: 60 [IU] via SUBCUTANEOUS
  Filled 2019-10-11 (×2): qty 0.6

## 2019-10-11 MED ORDER — HEPARIN (PORCINE) IN NACL 1000-0.9 UT/500ML-% IV SOLN
INTRAVENOUS | Status: AC
  Filled 2019-10-11: qty 1000

## 2019-10-11 MED ORDER — LABETALOL HCL 5 MG/ML IV SOLN: 10.0000 mg | INTRAVENOUS | Status: AC | PRN

## 2019-10-11 MED ORDER — SODIUM CHLORIDE 0.9 % IV SOLN: 250.0000 mL | INTRAVENOUS | Status: DC | PRN

## 2019-10-11 MED ORDER — NITROGLYCERIN 1 MG/10 ML FOR IR/CATH LAB
INTRA_ARTERIAL | Status: AC
  Filled 2019-10-11: qty 10

## 2019-10-11 MED ORDER — DIAZEPAM 5 MG PO TABS
5.0000 mg | ORAL_TABLET | Freq: Once | ORAL | Status: AC
  Administered 2019-10-12: 5 mg via ORAL
  Filled 2019-10-11: qty 1

## 2019-10-11 MED ORDER — CHLORHEXIDINE GLUCONATE 4 % EX LIQD
60.0000 mL | Freq: Once | CUTANEOUS | Status: AC
  Administered 2019-10-12: 4 via TOPICAL
  Filled 2019-10-11: qty 60

## 2019-10-11 MED ORDER — ASPIRIN 81 MG PO CHEW: 81.0000 mg | CHEWABLE_TABLET | ORAL | Status: DC

## 2019-10-11 MED ORDER — DOPAMINE-DEXTROSE 3.2-5 MG/ML-% IV SOLN
0.0000 ug/kg/min | INTRAVENOUS | Status: DC
  Filled 2019-10-11: qty 250

## 2019-10-11 MED ORDER — CHLORHEXIDINE GLUCONATE 0.12 % MT SOLN
15.0000 mL | Freq: Once | OROMUCOSAL | Status: AC
  Administered 2019-10-12: 15 mL via OROMUCOSAL
  Filled 2019-10-11: qty 15

## 2019-10-11 MED ORDER — FENTANYL CITRATE (PF) 100 MCG/2ML IJ SOLN
INTRAMUSCULAR | Status: AC
  Filled 2019-10-11: qty 2

## 2019-10-11 MED ORDER — MORPHINE SULFATE (PF) 2 MG/ML IV SOLN: 2.0000 mg | INTRAVENOUS | Status: DC | PRN

## 2019-10-11 MED ORDER — INSULIN ASPART 100 UNIT/ML ~~LOC~~ SOLN: 0.0000 [IU] | Freq: Every day | SUBCUTANEOUS | Status: DC

## 2019-10-11 MED ORDER — ASPIRIN EC 81 MG PO TBEC: 81.0000 mg | DELAYED_RELEASE_TABLET | Freq: Every day | ORAL | Status: DC

## 2019-10-11 SURGICAL SUPPLY — 11 items

## 2019-10-11 NOTE — Progress Notes (Signed)
Pre op vascular       has been completed. Preliminary results can be found under CV proc through chart review. Britten Seyfried, BS, RDMS, RVT   

## 2019-10-11 NOTE — Interval H&P Note (Signed)
Cath Lab Visit (complete for each Cath Lab visit)  Clinical Evaluation Leading to the Procedure:   ACS: No.  Non-ACS:    Anginal Classification: CCS II  Anti-ischemic medical therapy: Minimal Therapy (1 class of medications)  Non-Invasive Test Results: High-risk stress test findings: cardiac mortality >3%/year  Prior CABG: No previous CABG      History and Physical Interval Note:  10/11/2019 11:18 AM  Fred [name]  has presented today for surgery, with the diagnosis of 3 vessel disease.  The various methods of treatment have been discussed with the patient and family. After consideration of risks, benefits and other options for treatment, the patient has consented to  Procedure(s): LEFT HEART CATH AND CORONARY ANGIOGRAPHY (N/A) as a surgical intervention.  The patient's history has been reviewed, patient examined, no change in status, stable for surgery.  I have reviewed the patient's chart and labs.  Questions were answered to the patient's satisfaction.     Quay Burow

## 2019-10-11 NOTE — Consult Note (Signed)
[address]       [place] [address]             [place] Record #[id number] Date of Birth: May 05, 1941  Referring: Dr. Gwenlyn Found, MD Primary Care: Shirline Frees, MD Primary Cardiologist: Dr. Gwenlyn Found, MD  Chief Complaint: Decreased exercise tolerance   History of Present Illness:     This is a 78 year old male with a past medical history of diabetes mellitus and hyperlipidemia. He was seen in Dr. Kennon Holter office in September. At that time, he denied chest pain or shortness of breath. He was fairly active until COVID-19 outbreak, where he became more sedentary. He tried to exercise more and after going out on a run on 08/30/2019, he had nausea, felt very weak and dizzy. He felt like he might "pass out". He then had dry heaves. After returning to home, he vomited after drinking juice. EMS was summonsed and he presented to [facility] ED for further evaluation and treatment. EKG showed SR, occasional PVCs, no acute ST/T changes. It was felt his symptoms were from poor conditioning and dehydration.   He then presented to Dr. Kennon Holter office on 09/07/2019. He did admit to some chest discomfort on his way to his office. Dr. Gwenlyn Found ordered a ZIO patch to arrhythmogenic cause as well as a coronary CTA to rule out an ischemically mediated cause. CTA done 09/21/2019 showed coronary calcium score of 1619 (which was 79 percentile for age and sex matched control) as well as significant 3 vessel CAD. Echo was done on 10/06/2019. It showed LVEF 55-60%, mild AI, trivial TR, and the presence of pericardial fat pad. He then presented to [facility] today in order to undergo a cardiac catheterization done via right radial. Results showed significant stenoses of LM and LAD, Circumflex, and RCA. A cardiothoracic consultation has been requested with Dr. Prescott Gum. Patient was seen in cath lab holding area. He denies chest pain or shortness of breath and his vital  signs are stable.    Current Activity/ Functional Status: Patient is independent with mobility/ambulation, transfers, ADL's, IADL's.   Zubrod Score: At the time of surgery this patient's most appropriate activity status/level should be described as: []     0    Normal activity, no symptoms [x]     1    Restricted in physical strenuous activity but ambulatory, able to do out light work []     2    Ambulatory and capable of self care, unable to do work activities, up and about  more than 50%  Of the time                            []     3    Only limited self care, in bed greater than 50% of waking hours []     4    Completely disabled, no self care, confined to bed or chair []     5    Moribund  Past Medical History:  Diagnosis Date  . Diabetes mellitus without complication ([place])   . High cholesterol   . Vertigo     Past Surgical History:  Procedure Laterality Date  . FOOT SURGERY    . KNEE SURGERY    . NASAL SEPTUM SURGERY      Social History   Tobacco Use  Smoking Status Heavy smoker but quit  in 1964  Smokeless Tobacco Never Used    Social History   Substance and Sexual Activity  Alcohol Use No  He is retired from [employer]   Current Facility-Administered Medications  Medication Dose Route Frequency Provider Last Rate Last Dose  . 0.9 %  sodium chloride infusion  250 mL Intravenous PRN Runell Gess, MD      . 0.9 %  sodium chloride infusion   Intravenous Continuous Runell Gess, MD 75 mL/hr at 10/11/19 1155 75 mL/hr at 10/11/19 1155  . 0.9% sodium chloride infusion  1 mL/kg/hr Intravenous Continuous Runell Gess, MD 94.2 mL/hr at 10/11/19 1117 1 mL/kg/hr at 10/11/19 1117  . aspirin chewable tablet 81 mg  81 mg Oral Pre-Cath Runell Gess, MD      . aspirin EC tablet 81 mg  81 mg Oral Daily Runell Gess, MD      . glimepiride (AMARYL) tablet 4 mg  4 mg Oral BID Runell Gess, MD      . Insulin Glargine (1 Unit Dial) SOPN 60 mg  60 mg  Subcutaneous Daily Runell Gess, MD      . ondansetron [facility]) injection 4 mg  4 mg Intravenous Q6H PRN Runell Gess, MD      . simvastatin (ZOCOR) tablet 40 mg  40 mg Oral q1800 Runell Gess, MD      . sodium chloride flush (NS) 0.9 % injection 3 mL  3 mL Intravenous Q12H Runell Gess, MD      . sodium chloride flush (NS) 0.9 % injection 3 mL  3 mL Intravenous PRN Runell Gess, MD        Medications Prior to Admission  Medication Sig Dispense Refill Last Dose  . aspirin EC 81 MG tablet Take 81 mg by mouth daily.    10/11/2019 at 0600  . clotrimazole (LOTRIMIN) 1 % cream Apply 1 application topically as needed (Groin).   10/11/2019 at 0600  . glimepiride (AMARYL) 4 MG tablet Take 4 mg by mouth 2 (two) times daily.    10/10/2019 at Unknown time  . Insulin Glargine, 1 Unit Dial, (TOUJEO SOLOSTAR) 300 UNIT/ML SOPN Inject 60 mg into the skin daily.    10/10/2019 at Unknown time  . metFORMIN (GLUCOPHAGE) 1000 MG tablet Take 1,000 mg by mouth 2 (two) times daily with a meal.    10/10/2019 at Unknown time  . Multiple Vitamins-Minerals (MULTIVITAMIN WITH MINERALS) tablet Take 1 tablet by mouth daily.   10/11/2019 at 0600  . naproxen sodium (ALEVE) 220 MG tablet Take 220 mg by mouth daily as needed (for back pain).   Past Month at Unknown time  . simvastatin (ZOCOR) 40 MG tablet Take 40 mg by mouth daily at 6 PM.    10/10/2019 at Unknown time  . solifenacin (VESICARE) 5 MG tablet Take 5 mg by mouth daily.    10/11/2019 at 0600  . B-D UF III MINI PEN NEEDLES 31G X 5 MM MISC USE TO CHECK BLOOD SUGARS ONCE A DAY     . metoprolol tartrate (LOPRESSOR) 50 MG tablet Take 1 tablet (50 mg total) by mouth once for 1 dose. TAKE ONE TABLET TWO HOURS PRIOR TO YOUR CORONARY CTA (Patient not taking: Reported on 10/05/2019) 1 tablet 0 Not Taking at Unknown time  Allergies: None  Family History: Mother and younger brother had CABG  Review of Systems:   ROS    Cardiac Review of Systems: Y or  [  N    ]= no  Chest Pain [  N  ]  Resting SOB [ N  ] Exertional SOB  [Y  ]  Orthopnea [ N ]   Pedal Edema [ N  ]     Syncope  [ N ]   Presyncope [ Y  ]  General Review of Systems: [Y] = yes [ N ]=no Constitional:  fatigue [ Y ]; nausea [Y  ]; night sweats [ N ]; fever [ N ]; or chills [ N ]                                                                Eye : Amaurosis fugax[ N ]; Resp: cough Klaus.Mock[N  ];  wheezing[N  ];  hemoptysis[N  ];  GI:  vomiting[Y  ];  dysphagia[ N ]; melena[N  ];  hematochezia Klaus.Mock[N  ]; GU:  hematuria[ N ];                Skin: rash, swelling[N  ];,  Musculosketetal: Mandi.Rhodymyalgias[N  ];      joint pain[ N ];   Heme/Lymph:bleeding[ N ];  anemia[N  ];  Neuro: TIA[ N ];   stroke[ N ];   seizures[ N ];   paresthesias[  ];  difficulty walking[  ];  Psych:depression[  ]; anxiety[  ];  Endocrine: diabetes[ Y ];  thyroid dysfunction[Y-TSH Cramer.Melena6.220  ];             Physical Exam: BP 128/63   Pulse 75   Temp 97.6 F (36.4 C) (Oral)   Resp 16   Ht 5\' 7"  (1.702 m)   Wt 90.7 kg   SpO2 94%   BMI 31.32 kg/m    General appearance: alert, cooperative and no distress Head: Normocephalic, without obvious abnormality, atraumatic Neck: no carotid bruit, no JVD and supple, symmetrical, trachea midline Resp: clear to auscultation bilaterally Cardio: RRR, no murmur GI: Soft, protuberant (mildly obese), non tender, bowel sounds present Extremities: No LE edema;Palpable DP/PT bilaterally Neurologic: Grossly normal  Diagnostic Studies & Laboratory data: LEFT HEART CATH AND CORONARY ANGIOGRAPHY by Dr. Allyson SabalBerry on 10/11/2019:    Mid LM to Dist LM lesion is 80% stenosed.  Ost LAD to Prox LAD lesion is 90% stenosed.  Ost Cx to Prox Cx lesion is 90% stenosed.  Prox RCA to Mid RCA lesion is 90% stenosed.  Findings  Diagnostic Dominance: Right Left Main  Mid LM to Dist LM lesion 80% stenosed  Mid LM to Dist LM lesion is 80% stenosed. The lesion is moderately calcified.  Left Anterior  Descending  Ost LAD to Prox LAD lesion 90% stenosed  Ost LAD to Prox LAD lesion is 90% stenosed. The lesion is calcified.  Left Circumflex  Ost Cx to Prox Cx lesion 90% stenosed  Ost Cx to Prox Cx lesion is 90% stenosed.  Right Coronary Artery  Prox RCA to Mid RCA lesion 90% stenosed  Prox RCA to Mid RCA lesion is 90% stenosed. The lesion is calcified.  Intervention  No interventions have been documented.  Coronary Diagrams  Diagnostic Dominance: Right       Recent Radiology Findings:     I have independently reviewed the above radiologic studies and discussed with the patient  Recent Lab Findings: Lab Results  Component Value Date   WBC 9.0 10/05/2019   HGB 14.0 10/05/2019   HCT 41.2 10/05/2019   PLT 239 10/05/2019   GLUCOSE 201 (H) 10/05/2019   CHOL  10/24/2010    117        ATP III CLASSIFICATION:  <200     mg/dL   Desirable  409-811  mg/dL   Borderline High  >=914    mg/dL   High          TRIG 92 10/24/2010   HDL 31 (L) 10/24/2010   LDLCALC  10/24/2010    68        Total Cholesterol/HDL:CHD Risk Coronary Heart Disease Risk Table                     Men   Women  1/2 Average Risk   3.4   3.3  Average Risk       5.0   4.4  2 X Average Risk   9.6   7.1  3 X Average Risk  23.4   11.0        Use the calculated Patient Ratio above and the CHD Risk Table to determine the patient's CHD Risk.        ATP III CLASSIFICATION (LDL):  <100     mg/dL   Optimal  782-956  mg/dL   Near or Above                    Optimal  130-159  mg/dL   Borderline  213-086  mg/dL   High  >578     mg/dL   Very High   ALT 25 46/96/2952   AST 25 10/24/2010   NA 137 10/05/2019   K 4.5 10/05/2019   CL 99 10/05/2019   CREATININE 1.07 10/05/2019   BUN 17 10/05/2019   CO2 24 10/05/2019   TSH 6.220 (H) 10/05/2019   INR 0.94 10/24/2010   HGBA1C (H) 10/24/2010    7.8 (NOTE)                                                                       According to the ADA Clinical Practice  Recommendations for 2011, when HbA1c is used as a screening test:   >=6.5%   Diagnostic of Diabetes Mellitus           (if abnormal result  is confirmed)  5.7-6.4%   Increased risk of developing Diabetes Mellitus  References:Diagnosis and Classification of Diabetes Mellitus,Diabetes Care,2011,34(Suppl 1):S62-S69 and Standards of Medical Care in         Diabetes - 2011,Diabetes Care,2011,34  (Suppl 1):S11-S61.   Assessment / Plan:   1. Coronary artery disease-3 vessel disease, included 80% left main disease. He would benefit from coronary artery bypass grafting surgery. Dr. [name] to evaluate and discuss with patient 2. Hyperlipidemia-on Simvastatin 40 mg at hs 3. DM-on Insulin and Glimepiride. HGA1C 7.8  I  spent 20 minutes counseling the patient face to face.   Doree Fudge PA-C 10/11/2019 12:20 PM  Patient examined, coronary arteriograms reviewed Agree that patient will benefit from surgical coronary revascularization. I discussed the details of the operation and risks, benefits.The patient  agrees to proceed with surgery in am. patient examined and medical record reviewed,agree with above note. Kathlee Nations Trigt III 10/11/2019

## 2019-10-11 NOTE — Anesthesia Preprocedure Evaluation (Addendum)
Anesthesia Evaluation  Patient identified by MRN, date of birth, ID band Patient awake    Reviewed: Allergy & Precautions, NPO status , Patient's Chart, lab work & pertinent test results  History of Anesthesia Complications Negative for: history of anesthetic complications  Airway Mallampati: II  TM Distance: >3 FB Neck ROM: Full    Dental  (+) Dental Advisory Given   Pulmonary neg pulmonary ROS,    Pulmonary exam normal        Cardiovascular + CAD  Normal cardiovascular exam   '20 TTE - EF 55 to 60%. Mildly increased left ventricular hypertrophy. Grade I diastolic dysfunction. Mild AI. Presence of pericardial fat pad. Trivial TR. The interatrial septum 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  '20 Cath - Mid LM to Dist LM lesion is 80% stenosed. Ost LAD to Prox LAD lesion is 90% stenosed. Ost Cx to Prox Cx lesion is 90% stenosed. Prox RCA to Mid RCA lesion is 90% stenosed.  '20 Carotid US - 1-39% b/l ICAS    Neuro/Psych  Vertigo  negative psych ROS   GI/Hepatic negative GI ROS, Neg liver ROS,   Endo/Other  diabetes, Type 2, Oral Hypoglycemic Agents, Insulin Dependent Obesity   Renal/GU negative Renal ROS     Musculoskeletal negative musculoskeletal ROS (+)   Abdominal   Peds  Hematology negative hematology ROS (+)   Anesthesia Other Findings   Reproductive/Obstetrics                            Anesthesia Physical Anesthesia Plan  ASA: IV  Anesthesia Plan: General   Post-op Pain Management:    Induction: Intravenous  PONV Risk Score and Plan: 2 and Treatment may vary due to age or medical condition  Airway Management Planned: Oral ETT  Additional Equipment: Arterial line, CVP, PA Cath, TEE and Ultrasound Guidance Line Placement  Intra-op Plan:   Post-operative Plan: Post-operative intubation/ventilation  Informed Consent: I have reviewed the patients History and Physical,  chart, labs and discussed the procedure including the risks, benefits and alternatives for the proposed anesthesia with the patient or authorized representative who has indicated his/her understanding and acceptance.     Dental advisory given  Plan Discussed with: CRNA and Anesthesiologist  Anesthesia Plan Comments:        Anesthesia Quick Evaluation

## 2019-10-11 NOTE — Progress Notes (Signed)
TCTS consulted for CABG evaluation. °

## 2019-10-11 NOTE — Progress Notes (Signed)
Brought pt OHS booklet, careguide and IS. Pt has not spoken to surgeon yet therefore did not discuss preop. RN aware.  [place], ACSM 1:49 PM 10/11/2019

## 2019-10-12 ENCOUNTER — Inpatient Hospital Stay (HOSPITAL_COMMUNITY): Payer: Medicare Other

## 2019-10-12 ENCOUNTER — Encounter (HOSPITAL_COMMUNITY)
Admission: AD | Disposition: A | Discharge status: Home Health | Payer: Self-pay | Source: Ambulatory Visit | Attending: Cardiothoracic Surgery

## 2019-10-12 ENCOUNTER — Encounter (HOSPITAL_COMMUNITY): Payer: Self-pay | Admitting: Certified Registered Nurse Anesthetist

## 2019-10-12 ENCOUNTER — Inpatient Hospital Stay (HOSPITAL_COMMUNITY): Payer: Medicare Other | Admitting: Anesthesiology

## 2019-10-12 DIAGNOSIS — I25110 Atherosclerotic heart disease of native coronary artery with unstable angina pectoris: Secondary | ICD-10-CM

## 2019-10-12 DIAGNOSIS — Z951 Presence of aortocoronary bypass graft: Secondary | ICD-10-CM

## 2019-10-12 HISTORY — PX: CORONARY ARTERY BYPASS GRAFT: SHX141

## 2019-10-12 HISTORY — PX: TEE WITHOUT CARDIOVERSION: SHX5443

## 2019-10-12 LAB — POCT I-STAT 7, (LYTES, BLD GAS, ICA,H+H)
Acid-Base Excess: 1 mmol/L (ref 0.0–2.0)
Acid-base deficit: 1 mmol/L (ref 0.0–2.0)
Acid-base deficit: 2 mmol/L (ref 0.0–2.0)
Acid-base deficit: 5 mmol/L — ABNORMAL HIGH (ref 0.0–2.0)
Acid-base deficit: 2 mmol/L (ref 0.0–2.0)
Bicarbonate: 26.8 mmol/L (ref 20.0–28.0)
Bicarbonate: 23.8 mmol/L (ref 20.0–28.0)
Bicarbonate: 24 mmol/L (ref 20.0–28.0)
Bicarbonate: 20.1 mmol/L (ref 20.0–28.0)
Bicarbonate: 21.9 mmol/L (ref 20.0–28.0)
Calcium, Ion: 1.04 mmol/L — ABNORMAL LOW (ref 1.15–1.40)
Calcium, Ion: 1.25 mmol/L (ref 1.15–1.40)
Calcium, Ion: 1.2 mmol/L (ref 1.15–1.40)
Calcium, Ion: 1.14 mmol/L — ABNORMAL LOW (ref 1.15–1.40)
Calcium, Ion: 1.12 mmol/L — ABNORMAL LOW (ref 1.15–1.40)
HCT: 30 % — ABNORMAL LOW (ref 39.0–52.0)
HCT: 28 % — ABNORMAL LOW (ref 39.0–52.0)
HCT: 34 % — ABNORMAL LOW (ref 39.0–52.0)
HCT: 34 % — ABNORMAL LOW (ref 39.0–52.0)
HCT: 28 % — ABNORMAL LOW (ref 39.0–52.0)
Hemoglobin: 10.2 g/dL — ABNORMAL LOW (ref 13.0–17.0)
Hemoglobin: 9.5 g/dL — ABNORMAL LOW (ref 13.0–17.0)
Hemoglobin: 11.6 g/dL — ABNORMAL LOW (ref 13.0–17.0)
Hemoglobin: 11.6 g/dL — ABNORMAL LOW (ref 13.0–17.0)
Hemoglobin: 9.5 g/dL — ABNORMAL LOW (ref 13.0–17.0)
O2 Saturation: 100 %
O2 Saturation: 100 %
O2 Saturation: 97 %
O2 Saturation: 96 %
O2 Saturation: 91 %
Patient temperature: 35.8
Patient temperature: 37.2
Potassium: 4.3 mmol/L (ref 3.5–5.1)
Potassium: 4.1 mmol/L (ref 3.5–5.1)
Potassium: 4.2 mmol/L (ref 3.5–5.1)
Potassium: 4.2 mmol/L (ref 3.5–5.1)
Potassium: 4.4 mmol/L (ref 3.5–5.1)
Sodium: 140 mmol/L (ref 135–145)
Sodium: 139 mmol/L (ref 135–145)
Sodium: 140 mmol/L (ref 135–145)
Sodium: 141 mmol/L (ref 135–145)
Sodium: 140 mmol/L (ref 135–145)
TCO2: 28 mmol/L (ref 22–32)
TCO2: 25 mmol/L (ref 22–32)
TCO2: 25 mmol/L (ref 22–32)
TCO2: 21 mmol/L — ABNORMAL LOW (ref 22–32)
TCO2: 23 mmol/L (ref 22–32)
pCO2 arterial: 45.9 mmHg (ref 32.0–48.0)
pCO2 arterial: 39 mmHg (ref 32.0–48.0)
pCO2 arterial: 43.2 mmHg (ref 32.0–48.0)
pCO2 arterial: 38.6 mmHg (ref 32.0–48.0)
pCO2 arterial: 34.3 mmHg (ref 32.0–48.0)
pH, Arterial: 7.374 (ref 7.350–7.450)
pH, Arterial: 7.393 (ref 7.350–7.450)
pH, Arterial: 7.347 — ABNORMAL LOW (ref 7.350–7.450)
pH, Arterial: 7.325 — ABNORMAL LOW (ref 7.350–7.450)
pH, Arterial: 7.413 (ref 7.350–7.450)
pO2, Arterial: 315 mmHg — ABNORMAL HIGH (ref 83.0–108.0)
pO2, Arterial: 182 mmHg — ABNORMAL HIGH (ref 83.0–108.0)
pO2, Arterial: 87 mmHg (ref 83.0–108.0)
pO2, Arterial: 91 mmHg (ref 83.0–108.0)
pO2, Arterial: 59 mmHg — ABNORMAL LOW (ref 83.0–108.0)

## 2019-10-12 LAB — COMPREHENSIVE METABOLIC PANEL
ALT: 17 U/L (ref 0–44)
AST: 16 U/L (ref 15–41)
Albumin: 3.7 g/dL (ref 3.5–5.0)
Alkaline Phosphatase: 47 U/L (ref 38–126)
Anion gap: 10 (ref 5–15)
BUN: 18 mg/dL (ref 8–23)
CO2: 26 mmol/L (ref 22–32)
Calcium: 9.3 mg/dL (ref 8.9–10.3)
Chloride: 102 mmol/L (ref 98–111)
Creatinine, Ser: 1.17 mg/dL (ref 0.61–1.24)
GFR calc Af Amer: >60 mL/min (ref >60)
GFR calc non Af Amer: 59 mL/min — ABNORMAL LOW (ref >60)
Glucose, Bld: 187 mg/dL — ABNORMAL HIGH (ref 70–99)
Potassium: 4.5 mmol/L (ref 3.5–5.1)
Sodium: 138 mmol/L (ref 135–145)
Total Bilirubin: 0.6 mg/dL (ref 0.3–1.2)
Total Protein: 6.5 g/dL (ref 6.5–8.1)

## 2019-10-12 LAB — HEMOGLOBIN AND HEMATOCRIT, BLOOD
HCT: 35.6 % — ABNORMAL LOW (ref 39.0–52.0)
Hemoglobin: 11.8 g/dL — ABNORMAL LOW (ref 13.0–17.0)

## 2019-10-12 LAB — POCT I-STAT, CHEM 8
BUN: 16 mg/dL (ref 8–23)
BUN: 15 mg/dL (ref 8–23)
BUN: 12 mg/dL (ref 8–23)
BUN: 13 mg/dL (ref 8–23)
BUN: 13 mg/dL (ref 8–23)
BUN: 12 mg/dL (ref 8–23)
Calcium, Ion: 1.26 mmol/L (ref 1.15–1.40)
Calcium, Ion: 1.22 mmol/L (ref 1.15–1.40)
Calcium, Ion: 1.04 mmol/L — ABNORMAL LOW (ref 1.15–1.40)
Calcium, Ion: 1.05 mmol/L — ABNORMAL LOW (ref 1.15–1.40)
Calcium, Ion: 0.98 mmol/L — ABNORMAL LOW (ref 1.15–1.40)
Calcium, Ion: 1.26 mmol/L (ref 1.15–1.40)
Chloride: 102 mmol/L (ref 98–111)
Chloride: 105 mmol/L (ref 98–111)
Chloride: 103 mmol/L (ref 98–111)
Chloride: 103 mmol/L (ref 98–111)
Chloride: 101 mmol/L (ref 98–111)
Chloride: 101 mmol/L (ref 98–111)
Creatinine, Ser: 0.8 mg/dL (ref 0.61–1.24)
Creatinine, Ser: 0.7 mg/dL (ref 0.61–1.24)
Creatinine, Ser: 0.6 mg/dL — ABNORMAL LOW (ref 0.61–1.24)
Creatinine, Ser: 0.7 mg/dL (ref 0.61–1.24)
Creatinine, Ser: 0.6 mg/dL — ABNORMAL LOW (ref 0.61–1.24)
Creatinine, Ser: 0.7 mg/dL (ref 0.61–1.24)
Glucose, Bld: 150 mg/dL — ABNORMAL HIGH (ref 70–99)
Glucose, Bld: 130 mg/dL — ABNORMAL HIGH (ref 70–99)
Glucose, Bld: 124 mg/dL — ABNORMAL HIGH (ref 70–99)
Glucose, Bld: 139 mg/dL — ABNORMAL HIGH (ref 70–99)
Glucose, Bld: 175 mg/dL — ABNORMAL HIGH (ref 70–99)
Glucose, Bld: 166 mg/dL — ABNORMAL HIGH (ref 70–99)
HCT: 38 % — ABNORMAL LOW (ref 39.0–52.0)
HCT: 37 % — ABNORMAL LOW (ref 39.0–52.0)
HCT: 33 % — ABNORMAL LOW (ref 39.0–52.0)
HCT: 35 % — ABNORMAL LOW (ref 39.0–52.0)
HCT: 30 % — ABNORMAL LOW (ref 39.0–52.0)
HCT: 29 % — ABNORMAL LOW (ref 39.0–52.0)
Hemoglobin: 12.9 g/dL — ABNORMAL LOW (ref 13.0–17.0)
Hemoglobin: 12.6 g/dL — ABNORMAL LOW (ref 13.0–17.0)
Hemoglobin: 11.2 g/dL — ABNORMAL LOW (ref 13.0–17.0)
Hemoglobin: 11.9 g/dL — ABNORMAL LOW (ref 13.0–17.0)
Hemoglobin: 10.2 g/dL — ABNORMAL LOW (ref 13.0–17.0)
Hemoglobin: 9.9 g/dL — ABNORMAL LOW (ref 13.0–17.0)
Potassium: 4.4 mmol/L (ref 3.5–5.1)
Potassium: 4.3 mmol/L (ref 3.5–5.1)
Potassium: 4.8 mmol/L (ref 3.5–5.1)
Potassium: 5.1 mmol/L (ref 3.5–5.1)
Potassium: 4.8 mmol/L (ref 3.5–5.1)
Potassium: 4.2 mmol/L (ref 3.5–5.1)
Sodium: 140 mmol/L (ref 135–145)
Sodium: 141 mmol/L (ref 135–145)
Sodium: 139 mmol/L (ref 135–145)
Sodium: 140 mmol/L (ref 135–145)
Sodium: 139 mmol/L (ref 135–145)
Sodium: 139 mmol/L (ref 135–145)
TCO2: 27 mmol/L (ref 22–32)
TCO2: 26 mmol/L (ref 22–32)
TCO2: 24 mmol/L (ref 22–32)
TCO2: 26 mmol/L (ref 22–32)
TCO2: 25 mmol/L (ref 22–32)
TCO2: 24 mmol/L (ref 22–32)

## 2019-10-12 LAB — CBC
HCT: 42.8 % (ref 39.0–52.0)
HCT: 36.2 % — ABNORMAL LOW (ref 39.0–52.0)
HCT: 32.9 % — ABNORMAL LOW (ref 39.0–52.0)
Hemoglobin: 14 g/dL (ref 13.0–17.0)
Hemoglobin: 12.1 g/dL — ABNORMAL LOW (ref 13.0–17.0)
Hemoglobin: 11.4 g/dL — ABNORMAL LOW (ref 13.0–17.0)
MCH: 31.3 pg (ref 26.0–34.0)
MCH: 31.6 pg (ref 26.0–34.0)
MCH: 32.5 pg (ref 26.0–34.0)
MCHC: 32.7 g/dL (ref 30.0–36.0)
MCHC: 33.4 g/dL (ref 30.0–36.0)
MCHC: 34.7 g/dL (ref 30.0–36.0)
MCV: 95.5 fL (ref 80.0–100.0)
MCV: 94.5 fL (ref 80.0–100.0)
MCV: 93.7 fL (ref 80.0–100.0)
Platelets: 242 10*3/uL (ref 150–400)
Platelets: 169 10*3/uL (ref 150–400)
Platelets: 157 10*3/uL (ref 150–400)
RBC: 4.48 MIL/uL (ref 4.22–5.81)
RBC: 3.83 MIL/uL — ABNORMAL LOW (ref 4.22–5.81)
RBC: 3.51 MIL/uL — ABNORMAL LOW (ref 4.22–5.81)
RDW: 12.6 % (ref 11.5–15.5)
RDW: 12.5 % (ref 11.5–15.5)
RDW: 12.8 % (ref 11.5–15.5)
WBC: 6.9 10*3/uL (ref 4.0–10.5)
WBC: 17.1 10*3/uL — ABNORMAL HIGH (ref 4.0–10.5)
WBC: 13.1 10*3/uL — ABNORMAL HIGH (ref 4.0–10.5)
nRBC: 0 % (ref 0.0–0.2)
nRBC: 0 % (ref 0.0–0.2)
nRBC: 0 % (ref 0.0–0.2)

## 2019-10-12 LAB — BASIC METABOLIC PANEL
Anion gap: 8 (ref 5–15)
BUN: 9 mg/dL (ref 8–23)
CO2: 21 mmol/L — ABNORMAL LOW (ref 22–32)
Calcium: 7.9 mg/dL — ABNORMAL LOW (ref 8.9–10.3)
Chloride: 109 mmol/L (ref 98–111)
Creatinine, Ser: 0.91 mg/dL (ref 0.61–1.24)
GFR calc Af Amer: >60 mL/min (ref >60)
GFR calc non Af Amer: >60 mL/min (ref >60)
Glucose, Bld: 149 mg/dL — ABNORMAL HIGH (ref 70–99)
Potassium: 4.5 mmol/L (ref 3.5–5.1)
Sodium: 138 mmol/L (ref 135–145)

## 2019-10-12 LAB — PROTIME-INR
INR: 1.1 (ref 0.8–1.2)
INR: 1.4 — ABNORMAL HIGH (ref 0.8–1.2)
Prothrombin Time: 13.7 seconds (ref 11.4–15.2)
Prothrombin Time: 17.3 seconds — ABNORMAL HIGH (ref 11.4–15.2)

## 2019-10-12 LAB — ECHO INTRAOPERATIVE TEE
Height: 67 in
Weight: 3155.2 oz

## 2019-10-12 LAB — GLUCOSE, CAPILLARY
Glucose-Capillary: 144 mg/dL — ABNORMAL HIGH (ref 70–99)
Glucose-Capillary: 145 mg/dL — ABNORMAL HIGH (ref 70–99)
Glucose-Capillary: 125 mg/dL — ABNORMAL HIGH (ref 70–99)
Glucose-Capillary: 128 mg/dL — ABNORMAL HIGH (ref 70–99)
Glucose-Capillary: 126 mg/dL — ABNORMAL HIGH (ref 70–99)
Glucose-Capillary: 135 mg/dL — ABNORMAL HIGH (ref 70–99)
Glucose-Capillary: 142 mg/dL — ABNORMAL HIGH (ref 70–99)
Glucose-Capillary: 128 mg/dL — ABNORMAL HIGH (ref 70–99)
Glucose-Capillary: 118 mg/dL — ABNORMAL HIGH (ref 70–99)

## 2019-10-12 LAB — PLATELET COUNT: Platelets: 199 10*3/uL (ref 150–400)

## 2019-10-12 LAB — APTT
aPTT: 31 seconds (ref 24–36)
aPTT: 29 seconds (ref 24–36)

## 2019-10-12 LAB — MAGNESIUM: Magnesium: 3 mg/dL — ABNORMAL HIGH (ref 1.7–2.4)

## 2019-10-12 SURGERY — CORONARY ARTERY BYPASS GRAFTING (CABG)
Anesthesia: General | Site: Chest

## 2019-10-12 MED ORDER — OXYCODONE HCL 5 MG PO TABS
5.0000 mg | ORAL_TABLET | ORAL | Status: DC | PRN
  Administered 2019-10-13 – 2019-10-18 (×9): 5 mg via ORAL
  Filled 2019-10-12 (×9): qty 1

## 2019-10-12 MED ORDER — ROCURONIUM BROMIDE 10 MG/ML (PF) SYRINGE
PREFILLED_SYRINGE | INTRAVENOUS | Status: AC
  Filled 2019-10-12: qty 40

## 2019-10-12 MED ORDER — ROCURONIUM BROMIDE 10 MG/ML (PF) SYRINGE
PREFILLED_SYRINGE | INTRAVENOUS | Status: AC
  Filled 2019-10-12: qty 10

## 2019-10-12 MED ORDER — SODIUM CHLORIDE 0.9 % IV SOLN: 250.0000 mL | INTRAVENOUS | Status: DC

## 2019-10-12 MED ORDER — SODIUM CHLORIDE 0.9 % IV SOLN
20.0000 ug | Freq: Once | INTRAVENOUS | Status: AC
  Administered 2019-10-12: 20 ug via INTRAVENOUS
  Filled 2019-10-12: qty 5

## 2019-10-12 MED ORDER — ONDANSETRON HCL 4 MG/2ML IJ SOLN
4.0000 mg | Freq: Four times a day (QID) | INTRAMUSCULAR | Status: DC | PRN
  Administered 2019-10-14 – 2019-10-15 (×2): 4 mg via INTRAVENOUS
  Filled 2019-10-12 (×2): qty 2

## 2019-10-12 MED ORDER — 0.9 % SODIUM CHLORIDE (POUR BTL) OPTIME
TOPICAL | Status: DC | PRN
  Administered 2019-10-12: 5000 mL

## 2019-10-12 MED ORDER — NITROGLYCERIN IN D5W 200-5 MCG/ML-% IV SOLN
0.0000 ug/min | INTRAVENOUS | Status: DC
  Administered 2019-10-12: 10 ug/min via INTRAVENOUS

## 2019-10-12 MED ORDER — MIDAZOLAM HCL 2 MG/2ML IJ SOLN: 2.0000 mg | INTRAMUSCULAR | Status: DC | PRN

## 2019-10-12 MED ORDER — SODIUM CHLORIDE (PF) 0.9 % IJ SOLN
INTRAMUSCULAR | Status: AC
  Filled 2019-10-12: qty 10

## 2019-10-12 MED ORDER — SODIUM CHLORIDE (PF) 0.9 % IJ SOLN
OROMUCOSAL | Status: DC | PRN
  Administered 2019-10-12 (×3): 4 mL via TOPICAL

## 2019-10-12 MED ORDER — FENTANYL CITRATE (PF) 250 MCG/5ML IJ SOLN
INTRAMUSCULAR | Status: DC | PRN
  Administered 2019-10-12: 150 ug via INTRAVENOUS
  Administered 2019-10-12 (×4): 100 ug via INTRAVENOUS
  Administered 2019-10-12: 250 ug via INTRAVENOUS
  Administered 2019-10-12 (×2): 150 ug via INTRAVENOUS
  Administered 2019-10-12: 50 ug via INTRAVENOUS
  Administered 2019-10-12: 100 ug via INTRAVENOUS

## 2019-10-12 MED ORDER — METOPROLOL TARTRATE 25 MG/10 ML ORAL SUSPENSION: 12.5000 mg | Freq: Two times a day (BID) | ORAL | Status: DC

## 2019-10-12 MED ORDER — ACETAMINOPHEN 650 MG RE SUPP
650.0000 mg | Freq: Once | RECTAL | Status: AC
  Administered 2019-10-12: 650 mg via RECTAL

## 2019-10-12 MED ORDER — INSULIN REGULAR(HUMAN) IN NACL 100-0.9 UT/100ML-% IV SOLN
INTRAVENOUS | Status: DC
  Administered 2019-10-12: 3.4 [IU]/h via INTRAVENOUS
  Administered 2019-10-13: 2.9 [IU]/h via INTRAVENOUS
  Filled 2019-10-12: qty 100

## 2019-10-12 MED ORDER — HEPARIN SODIUM (PORCINE) 1000 UNIT/ML IJ SOLN
INTRAMUSCULAR | Status: DC | PRN
  Administered 2019-10-12: 2000 [IU] via INTRAVENOUS
  Administered 2019-10-12: 25000 [IU] via INTRAVENOUS

## 2019-10-12 MED ORDER — PHENYLEPHRINE 40 MCG/ML (10ML) SYRINGE FOR IV PUSH (FOR BLOOD PRESSURE SUPPORT)
PREFILLED_SYRINGE | INTRAVENOUS | Status: AC
  Filled 2019-10-12: qty 10

## 2019-10-12 MED ORDER — ACETAMINOPHEN 500 MG PO TABS
1000.0000 mg | ORAL_TABLET | Freq: Four times a day (QID) | ORAL | Status: AC
  Administered 2019-10-12 – 2019-10-17 (×16): 1000 mg via ORAL
  Filled 2019-10-12 (×17): qty 2

## 2019-10-12 MED ORDER — SODIUM CHLORIDE 0.9 % IV SOLN
INTRAVENOUS | Status: DC | PRN
  Administered 2019-10-12: 07:00:00 via INTRAVENOUS

## 2019-10-12 MED ORDER — PHENYLEPHRINE HCL-NACL 20-0.9 MG/250ML-% IV SOLN
0.0000 ug/min | INTRAVENOUS | Status: DC
  Administered 2019-10-12: 10 ug/min via INTRAVENOUS

## 2019-10-12 MED ORDER — CHLORHEXIDINE GLUCONATE 0.12 % MT SOLN
15.0000 mL | OROMUCOSAL | Status: AC
  Administered 2019-10-12: 15 mL via OROMUCOSAL

## 2019-10-12 MED ORDER — SODIUM CHLORIDE 0.9 % IV SOLN: INTRAVENOUS | Status: DC

## 2019-10-12 MED ORDER — SODIUM CHLORIDE 0.9 % IV SOLN
INTRAVENOUS | Status: DC | PRN
  Administered 2019-10-12: 20 ug via INTRAVENOUS

## 2019-10-12 MED ORDER — ALBUMIN HUMAN 5 % IV SOLN
250.0000 mL | INTRAVENOUS | Status: AC | PRN
  Administered 2019-10-12 (×2): 12.5 g via INTRAVENOUS

## 2019-10-12 MED ORDER — PHENYLEPHRINE 40 MCG/ML (10ML) SYRINGE FOR IV PUSH (FOR BLOOD PRESSURE SUPPORT)
PREFILLED_SYRINGE | INTRAVENOUS | Status: DC | PRN
  Administered 2019-10-12: 80 ug via INTRAVENOUS

## 2019-10-12 MED ORDER — VANCOMYCIN HCL IN DEXTROSE 1-5 GM/200ML-% IV SOLN
1000.0000 mg | Freq: Two times a day (BID) | INTRAVENOUS | Status: DC
  Administered 2019-10-12: 1000 mg via INTRAVENOUS
  Filled 2019-10-12 (×2): qty 200

## 2019-10-12 MED ORDER — MIDAZOLAM HCL (PF) 10 MG/2ML IJ SOLN
INTRAMUSCULAR | Status: AC
  Filled 2019-10-12: qty 2

## 2019-10-12 MED ORDER — ASPIRIN 81 MG PO CHEW: 324.0000 mg | CHEWABLE_TABLET | Freq: Every day | ORAL | Status: DC

## 2019-10-12 MED ORDER — HEPARIN SODIUM (PORCINE) 1000 UNIT/ML IJ SOLN
INTRAMUSCULAR | Status: AC
  Filled 2019-10-12: qty 1

## 2019-10-12 MED ORDER — LACTATED RINGERS IV SOLN: 500.0000 mL | Freq: Once | INTRAVENOUS | Status: DC | PRN

## 2019-10-12 MED ORDER — VANCOMYCIN HCL IN DEXTROSE 1-5 GM/200ML-% IV SOLN: 1000.0000 mg | Freq: Once | INTRAVENOUS | Status: DC

## 2019-10-12 MED ORDER — PROTAMINE SULFATE 10 MG/ML IV SOLN
INTRAVENOUS | Status: DC | PRN
  Administered 2019-10-12: 220 mg via INTRAVENOUS
  Administered 2019-10-12: 20 mg via INTRAVENOUS

## 2019-10-12 MED ORDER — MORPHINE SULFATE (PF) 2 MG/ML IV SOLN
1.0000 mg | INTRAVENOUS | Status: DC | PRN
  Administered 2019-10-12 – 2019-10-14 (×3): 2 mg via INTRAVENOUS
  Filled 2019-10-12 (×3): qty 1

## 2019-10-12 MED ORDER — ACETAMINOPHEN 160 MG/5ML PO SOLN: 650.0000 mg | Freq: Once | ORAL | Status: AC

## 2019-10-12 MED ORDER — SODIUM CHLORIDE 0.9% FLUSH: 3.0000 mL | INTRAVENOUS | Status: DC | PRN

## 2019-10-12 MED ORDER — PROPOFOL 10 MG/ML IV BOLUS
INTRAVENOUS | Status: AC
  Filled 2019-10-12: qty 20

## 2019-10-12 MED ORDER — SODIUM CHLORIDE 0.9% FLUSH
3.0000 mL | Freq: Two times a day (BID) | INTRAVENOUS | Status: DC
  Administered 2019-10-13 – 2019-10-16 (×6): 3 mL via INTRAVENOUS

## 2019-10-12 MED ORDER — HEMOSTATIC AGENTS (NO CHARGE) OPTIME
TOPICAL | Status: DC | PRN
  Administered 2019-10-12: 1 via TOPICAL

## 2019-10-12 MED ORDER — ALBUMIN HUMAN 5 % IV SOLN
INTRAVENOUS | Status: DC | PRN
  Administered 2019-10-12 (×2): via INTRAVENOUS

## 2019-10-12 MED ORDER — PROPOFOL 10 MG/ML IV BOLUS
INTRAVENOUS | Status: DC | PRN
  Administered 2019-10-12: 40 mg via INTRAVENOUS

## 2019-10-12 MED ORDER — PANTOPRAZOLE SODIUM 40 MG PO TBEC
40.0000 mg | DELAYED_RELEASE_TABLET | Freq: Every day | ORAL | Status: DC
  Administered 2019-10-14 – 2019-10-20 (×7): 40 mg via ORAL
  Filled 2019-10-12 (×7): qty 1

## 2019-10-12 MED ORDER — LACTATED RINGERS IV SOLN
INTRAVENOUS | Status: DC | PRN
  Administered 2019-10-12: 07:00:00 via INTRAVENOUS

## 2019-10-12 MED ORDER — MAGNESIUM SULFATE 4 GM/100ML IV SOLN
4.0000 g | Freq: Once | INTRAVENOUS | Status: AC
  Administered 2019-10-12: 4 g via INTRAVENOUS
  Filled 2019-10-12: qty 100

## 2019-10-12 MED ORDER — ACETAMINOPHEN 160 MG/5ML PO SOLN: 1000.0000 mg | Freq: Four times a day (QID) | ORAL | Status: AC

## 2019-10-12 MED ORDER — FENTANYL CITRATE (PF) 250 MCG/5ML IJ SOLN
INTRAMUSCULAR | Status: AC
  Filled 2019-10-12: qty 25

## 2019-10-12 MED ORDER — LACTATED RINGERS IV SOLN: INTRAVENOUS | Status: DC

## 2019-10-12 MED ORDER — ONDANSETRON HCL 4 MG/2ML IJ SOLN
INTRAMUSCULAR | Status: AC
  Filled 2019-10-12: qty 2

## 2019-10-12 MED ORDER — HYDROCORTISONE NA SUCCINATE PF 100 MG IJ SOLR
50.0000 mg | Freq: Once | INTRAMUSCULAR | Status: AC
  Administered 2019-10-12: 50 mg via INTRAVENOUS
  Filled 2019-10-12: qty 2

## 2019-10-12 MED ORDER — SODIUM CHLORIDE 0.45 % IV SOLN: INTRAVENOUS | Status: DC | PRN

## 2019-10-12 MED ORDER — ROCURONIUM BROMIDE 10 MG/ML (PF) SYRINGE
PREFILLED_SYRINGE | INTRAVENOUS | Status: DC | PRN
  Administered 2019-10-12: 50 mg via INTRAVENOUS
  Administered 2019-10-12: 30 mg via INTRAVENOUS
  Administered 2019-10-12: 100 mg via INTRAVENOUS
  Administered 2019-10-12: 50 mg via INTRAVENOUS

## 2019-10-12 MED ORDER — METOPROLOL TARTRATE 12.5 MG HALF TABLET
12.5000 mg | ORAL_TABLET | Freq: Two times a day (BID) | ORAL | Status: DC
  Administered 2019-10-12 – 2019-10-13 (×3): 12.5 mg via ORAL
  Filled 2019-10-12 (×3): qty 1

## 2019-10-12 MED ORDER — DEXMEDETOMIDINE HCL IN NACL 400 MCG/100ML IV SOLN
0.0000 ug/kg/h | INTRAVENOUS | Status: DC
  Administered 2019-10-12: 0.7 ug/kg/h via INTRAVENOUS

## 2019-10-12 MED ORDER — DARIFENACIN HYDROBROMIDE ER 7.5 MG PO TB24
7.5000 mg | ORAL_TABLET | Freq: Every day | ORAL | Status: DC
  Administered 2019-10-13 – 2019-10-20 (×8): 7.5 mg via ORAL
  Filled 2019-10-12 (×10): qty 1

## 2019-10-12 MED ORDER — FAMOTIDINE IN NACL 20-0.9 MG/50ML-% IV SOLN
20.0000 mg | Freq: Two times a day (BID) | INTRAVENOUS | Status: AC
  Administered 2019-10-12 (×2): 20 mg via INTRAVENOUS
  Filled 2019-10-12: qty 50

## 2019-10-12 MED ORDER — ASPIRIN EC 325 MG PO TBEC
325.0000 mg | DELAYED_RELEASE_TABLET | Freq: Every day | ORAL | Status: DC
  Administered 2019-10-13 – 2019-10-20 (×8): 325 mg via ORAL
  Filled 2019-10-12 (×8): qty 1

## 2019-10-12 MED ORDER — POTASSIUM CHLORIDE 10 MEQ/50ML IV SOLN: 10.0000 meq | INTRAVENOUS | Status: AC

## 2019-10-12 MED ORDER — MILRINONE LACTATE IN DEXTROSE 20-5 MG/100ML-% IV SOLN
0.1250 ug/kg/min | INTRAVENOUS | Status: DC
  Administered 2019-10-12: 0.125 ug/kg/min via INTRAVENOUS

## 2019-10-12 MED ORDER — BISACODYL 10 MG RE SUPP
10.0000 mg | Freq: Every day | RECTAL | Status: DC
  Filled 2019-10-12: qty 1

## 2019-10-12 MED ORDER — LACTATED RINGERS IV SOLN
INTRAVENOUS | Status: DC
  Administered 2019-10-12 – 2019-10-13 (×2): via INTRAVENOUS

## 2019-10-12 MED ORDER — PROTAMINE SULFATE 10 MG/ML IV SOLN
INTRAVENOUS | Status: AC
  Filled 2019-10-12: qty 25

## 2019-10-12 MED ORDER — MIDAZOLAM HCL 5 MG/5ML IJ SOLN
INTRAMUSCULAR | Status: DC | PRN
  Administered 2019-10-12: 3 mg via INTRAVENOUS
  Administered 2019-10-12 (×2): 1 mg via INTRAVENOUS

## 2019-10-12 MED ORDER — DOCUSATE SODIUM 100 MG PO CAPS
200.0000 mg | ORAL_CAPSULE | Freq: Every day | ORAL | Status: DC
  Administered 2019-10-13 – 2019-10-17 (×5): 200 mg via ORAL
  Filled 2019-10-12 (×5): qty 2

## 2019-10-12 MED ORDER — CHLORHEXIDINE GLUCONATE CLOTH 2 % EX PADS
6.0000 | MEDICATED_PAD | Freq: Every day | CUTANEOUS | Status: DC
  Administered 2019-10-12 – 2019-10-17 (×6): 6 via TOPICAL

## 2019-10-12 MED ORDER — SODIUM CHLORIDE 0.9 % IV SOLN
1.5000 g | Freq: Two times a day (BID) | INTRAVENOUS | Status: DC
  Administered 2019-10-12 – 2019-10-14 (×4): 1.5 g via INTRAVENOUS
  Filled 2019-10-12 (×4): qty 1.5

## 2019-10-12 MED ORDER — TRAMADOL HCL 50 MG PO TABS
50.0000 mg | ORAL_TABLET | ORAL | Status: DC | PRN
  Administered 2019-10-13 – 2019-10-16 (×9): 50 mg via ORAL
  Filled 2019-10-12 (×9): qty 1

## 2019-10-12 MED ORDER — METOCLOPRAMIDE HCL 5 MG/ML IJ SOLN
10.0000 mg | Freq: Four times a day (QID) | INTRAMUSCULAR | Status: AC
  Administered 2019-10-12 – 2019-10-16 (×17): 10 mg via INTRAVENOUS
  Filled 2019-10-12 (×17): qty 2

## 2019-10-12 MED ORDER — INSULIN REGULAR BOLUS VIA INFUSION
0.0000 [IU] | Freq: Three times a day (TID) | INTRAVENOUS | Status: DC
  Filled 2019-10-12: qty 10

## 2019-10-12 MED ORDER — METOPROLOL TARTRATE 5 MG/5ML IV SOLN
2.5000 mg | INTRAVENOUS | Status: DC | PRN
  Administered 2019-10-12 – 2019-10-13 (×2): 5 mg via INTRAVENOUS
  Filled 2019-10-12: qty 5

## 2019-10-12 MED ORDER — BISACODYL 5 MG PO TBEC
10.0000 mg | DELAYED_RELEASE_TABLET | Freq: Every day | ORAL | Status: DC
  Administered 2019-10-13 – 2019-10-20 (×7): 10 mg via ORAL
  Filled 2019-10-12 (×8): qty 2

## 2019-10-12 SURGICAL SUPPLY — 110 items
ADAPTER CARDIO PERF ANTE/RETRO (ADAPTER) ×4 IMPLANT
ADH SKN CLS APL DERMABOND .7 (GAUZE/BANDAGES/DRESSINGS) ×2
ADPR PRFSN 84XANTGRD RTRGD (ADAPTER) ×2
AGENT HMST KT MTR STRL THRMB (HEMOSTASIS) ×2
BAG DECANTER FOR FLEXI CONT (MISCELLANEOUS) ×4 IMPLANT
BASKET HEART  (ORDER IN 25'S) (MISCELLANEOUS) ×1
BASKET HEART (ORDER IN 25'S) (MISCELLANEOUS) ×1
BASKET HEART (ORDER IN 25S) (MISCELLANEOUS) ×2 IMPLANT
BLADE CLIPPER SURG (BLADE) ×4 IMPLANT
BLADE NDL 3 SS STRL (BLADE) IMPLANT
BLADE NEEDLE 3 SS STRL (BLADE) ×3 IMPLANT
BLADE NEEDLE 3MM SS STRL (BLADE) ×1
BLADE STERNUM SYSTEM 6 (BLADE) ×4 IMPLANT
BLADE SURG 11 STRL SS (BLADE) ×2 IMPLANT
BLADE SURG 12 STRL SS (BLADE) ×4 IMPLANT
BNDG CMPR MED 10X6 ELC LF (GAUZE/BANDAGES/DRESSINGS) ×2
BNDG ELASTIC 4X5.8 VLCR STR LF (GAUZE/BANDAGES/DRESSINGS) ×4 IMPLANT
BNDG ELASTIC 6X10 VLCR STRL LF (GAUZE/BANDAGES/DRESSINGS) ×2 IMPLANT
BNDG ELASTIC 6X5.8 VLCR STR LF (GAUZE/BANDAGES/DRESSINGS) ×4 IMPLANT
BNDG GAUZE ELAST 4 BULKY (GAUZE/BANDAGES/DRESSINGS) ×4 IMPLANT
CANISTER SUCT 3000ML PPV (MISCELLANEOUS) ×4 IMPLANT
CANNULA GUNDRY RCSP 15FR (MISCELLANEOUS) ×4 IMPLANT
CATH CPB KIT VANTRIGT (MISCELLANEOUS) ×4 IMPLANT
CATH ROBINSON RED A/P 18FR (CATHETERS) ×12 IMPLANT
CATH THORACIC 36FR RT ANG (CATHETERS) ×4 IMPLANT
CLIP FOGARTY SPRING 6M (CLIP) ×2 IMPLANT
CLIP VESOCCLUDE SM WIDE 24/CT (CLIP) ×4 IMPLANT
COVER WAND RF STERILE (DRAPES) ×2 IMPLANT
DERMABOND ADVANCED (GAUZE/BANDAGES/DRESSINGS) ×2
DERMABOND ADVANCED .7 DNX12 (GAUZE/BANDAGES/DRESSINGS) IMPLANT
DRAIN CHANNEL 32F RND 10.7 FF (WOUND CARE) ×4 IMPLANT
DRAPE CARDIOVASCULAR INCISE (DRAPES) ×4
DRAPE SLUSH/WARMER DISC (DRAPES) ×4 IMPLANT
DRAPE SRG 135X102X78XABS (DRAPES) ×2 IMPLANT
DRSG AQUACEL AG ADV 3.5X14 (GAUZE/BANDAGES/DRESSINGS) ×4 IMPLANT
ELECT BLADE 4.0 EZ CLEAN MEGAD (MISCELLANEOUS) ×4
ELECT BLADE 6.5 EXT (BLADE) ×2 IMPLANT
ELECT CAUTERY BLADE 6.4 (BLADE) ×4 IMPLANT
ELECT REM PT RETURN 9FT ADLT (ELECTROSURGICAL) ×8
ELECTRODE BLDE 4.0 EZ CLN MEGD (MISCELLANEOUS) ×2 IMPLANT
ELECTRODE REM PT RTRN 9FT ADLT (ELECTROSURGICAL) ×4 IMPLANT
FELT TEFLON 1X6 (MISCELLANEOUS) ×8 IMPLANT
GAUZE SPONGE 4X4 12PLY STRL (GAUZE/BANDAGES/DRESSINGS) ×8 IMPLANT
GAUZE SPONGE 4X4 12PLY STRL LF (GAUZE/BANDAGES/DRESSINGS) ×4 IMPLANT
GLOVE BIO SURGEON STRL SZ 6 (GLOVE) ×6 IMPLANT
GLOVE BIO SURGEON STRL SZ 6.5 (GLOVE) ×1 IMPLANT
GLOVE BIO SURGEON STRL SZ7.5 (GLOVE) ×12 IMPLANT
GLOVE BIO SURGEONS STRL SZ 6.5 (GLOVE) ×1
GLOVE BIOGEL PI IND STRL 6.5 (GLOVE) IMPLANT
GLOVE BIOGEL PI IND STRL 8 (GLOVE) IMPLANT
GLOVE BIOGEL PI INDICATOR 6.5 (GLOVE) ×6
GLOVE BIOGEL PI INDICATOR 8 (GLOVE) ×6
GOWN STRL REUS W/ TWL LRG LVL3 (GOWN DISPOSABLE) ×8 IMPLANT
GOWN STRL REUS W/TWL LRG LVL3 (GOWN DISPOSABLE) ×28
HEMOSTAT POWDER SURGIFOAM 1G (HEMOSTASIS) ×12 IMPLANT
HEMOSTAT SURGICEL 2X14 (HEMOSTASIS) ×4 IMPLANT
INSERT FOGARTY XLG (MISCELLANEOUS) IMPLANT
KIT BASIN OR (CUSTOM PROCEDURE TRAY) ×4 IMPLANT
KIT SUCTION CATH 14FR (SUCTIONS) ×4 IMPLANT
KIT TURNOVER KIT B (KITS) ×4 IMPLANT
KIT VASOVIEW HEMOPRO 2 VH 4000 (KITS) ×4 IMPLANT
LEAD PACING MYOCARDI (MISCELLANEOUS) ×4 IMPLANT
MARKER GRAFT CORONARY BYPASS (MISCELLANEOUS) ×12 IMPLANT
NS IRRIG 1000ML POUR BTL (IV SOLUTION) ×20 IMPLANT
PACK E OPEN HEART (SUTURE) ×4 IMPLANT
PACK OPEN HEART (CUSTOM PROCEDURE TRAY) ×4 IMPLANT
PAD ARMBOARD 7.5X6 YLW CONV (MISCELLANEOUS) ×4 IMPLANT
PAD ELECT DEFIB RADIOL ZOLL (MISCELLANEOUS) ×4 IMPLANT
PENCIL BUTTON HOLSTER BLD 10FT (ELECTRODE) ×4 IMPLANT
POSITIONER HEAD DONUT 9IN (MISCELLANEOUS) ×4 IMPLANT
PUNCH AORTIC ROTATE 4.0MM (MISCELLANEOUS) IMPLANT
PUNCH AORTIC ROTATE 4.5MM 8IN (MISCELLANEOUS) ×2 IMPLANT
PUNCH AORTIC ROTATE 5MM 8IN (MISCELLANEOUS) IMPLANT
SET CARDIOPLEGIA MPS 5001102 (MISCELLANEOUS) ×2 IMPLANT
SPONGE LAP 18X18 RF (DISPOSABLE) ×2 IMPLANT
SURGIFLO W/THROMBIN 8M KIT (HEMOSTASIS) ×4 IMPLANT
SUT BONE WAX W31G (SUTURE) ×4 IMPLANT
SUT MNCRL AB 4-0 PS2 18 (SUTURE) ×2 IMPLANT
SUT PROLENE 3 0 SH DA (SUTURE) ×4 IMPLANT
SUT PROLENE 3 0 SH1 36 (SUTURE) IMPLANT
SUT PROLENE 4 0 RB 1 (SUTURE) ×4
SUT PROLENE 4 0 SH DA (SUTURE) ×4 IMPLANT
SUT PROLENE 4-0 RB1 .5 CRCL 36 (SUTURE) ×2 IMPLANT
SUT PROLENE 5 0 C 1 36 (SUTURE) IMPLANT
SUT PROLENE 6 0 C 1 30 (SUTURE) ×6 IMPLANT
SUT PROLENE 6 0 CC (SUTURE) ×18 IMPLANT
SUT PROLENE 8 0 BV175 6 (SUTURE) ×6 IMPLANT
SUT PROLENE BLUE 7 0 (SUTURE) ×6 IMPLANT
SUT PROLENE POLY MONO (SUTURE) ×4 IMPLANT
SUT SILK  1 MH (SUTURE)
SUT SILK 1 MH (SUTURE) IMPLANT
SUT SILK 2 0 SH CR/8 (SUTURE) ×2 IMPLANT
SUT SILK 3 0 SH CR/8 (SUTURE) IMPLANT
SUT STEEL 6MS V (SUTURE) ×6 IMPLANT
SUT STEEL SZ 6 DBL 3X14 BALL (SUTURE) ×4 IMPLANT
SUT VIC AB 1 CTX 36 (SUTURE) ×8
SUT VIC AB 1 CTX36XBRD ANBCTR (SUTURE) ×4 IMPLANT
SUT VIC AB 2-0 CT1 27 (SUTURE) ×4
SUT VIC AB 2-0 CT1 TAPERPNT 27 (SUTURE) IMPLANT
SUT VIC AB 2-0 CTX 27 (SUTURE) IMPLANT
SUT VIC AB 3-0 X1 27 (SUTURE) IMPLANT
SYSTEM SAHARA CHEST DRAIN ATS (WOUND CARE) ×4 IMPLANT
TAPE CLOTH SURG 4X10 WHT LF (GAUZE/BANDAGES/DRESSINGS) ×2 IMPLANT
TAPE CLOTH SURG 6X10 WHT LF (GAUZE/BANDAGES/DRESSINGS) ×2 IMPLANT
TOWEL GREEN STERILE (TOWEL DISPOSABLE) ×4 IMPLANT
TOWEL GREEN STERILE FF (TOWEL DISPOSABLE) ×4 IMPLANT
TRAY FOLEY SLVR 16FR TEMP STAT (SET/KITS/TRAYS/PACK) ×4 IMPLANT
TUBING LAP HI FLOW INSUFFLATIO (TUBING) ×4 IMPLANT
UNDERPAD 30X30 (UNDERPADS AND DIAPERS) ×4 IMPLANT
WATER STERILE IRR 1000ML POUR (IV SOLUTION) ×8 IMPLANT

## 2019-10-12 NOTE — Transfer of Care (Signed)
Immediate Anesthesia Transfer of Care Note  Patient: Fred [name]  Procedure(s) Performed: CORONARY ARTERY BYPASS GRAFTING (CABG) x 4 (LIMA to LAD, SVG to OM, SVG to RAMUS INTERMEDIATE, SVG to PDA) with EVH from [place] and LEFT INTERNAL MAMMARY ARTERY HARVEST (N/A Chest) TRANSESOPHAGEAL ECHOCARDIOGRAM (TEE) (N/A )  Patient Location: SICU  Anesthesia Type:General  Level of Consciousness: Patient remains intubated per anesthesia plan  Airway & Oxygen Therapy: Patient remains intubated per anesthesia plan and Patient placed on Ventilator (see vital sign flow sheet for setting)  Post-op Assessment: Report given to RN and Post -op Vital signs reviewed and stable  Post vital signs: Reviewed and stable  Last Vitals:  Vitals Value Taken Time  BP 108/68 10/12/19 1400  Temp 35.7 C 10/12/19 1421  Pulse 88 10/12/19 1421  Resp 12 10/12/19 1421  SpO2 100 % 10/12/19 1421  Vitals shown include unvalidated device data.  Last Pain:  Vitals:   10/12/19 0458  TempSrc: Oral  PainSc:          Complications: No apparent anesthesia complications

## 2019-10-12 NOTE — Progress Notes (Signed)
      [address]       [place] [address]             [phone]      Extubated, no complaints Just received morphine for pain BP 108/68   Pulse 96   Temp (!) 100.6 F (38.1 C)   Resp 17   Ht 5\' 7"  (1.702 m)   Wt 89.4 kg Comment: scale a  SpO2 98%   BMI 30.89 kg/m  2L  94% sat 28/17 CI= 3 Hgb 11, K= 4.2 Febrile to 101.8 despite acetaminophen- will give 50 mg of hydrocortisone  Remo Lipps C. Roxan Hockey, MD [facility] [phone]

## 2019-10-12 NOTE — Progress Notes (Signed)
Patient continuesto compoain of feeling cold, his temperature has risen to 38.5. dangled at bedside, c.o 5.10 pain. Medicated with 2mg  morphne Dr Roxan Hockey aware of temp started back on phenelphrine

## 2019-10-12 NOTE — Progress Notes (Signed)
Pt started on SIMV rapid wean protocol at 1600. Pt tolerating current settings well at this time. Pt is able to follow commands and open eyes.

## 2019-10-12 NOTE — Anesthesia Procedure Notes (Signed)
Central Venous Catheter Insertion Performed by: Audry Pili, MD, anesthesiologist Start/End11/02/2019 7:00 AM, 10/12/2019 7:24 AM Preanesthetic checklist: patient identified, IV checked, risks and benefits discussed, surgical consent, monitors and equipment checked, pre-op evaluation, timeout performed and anesthesia consent Position: Trendelenburg Lidocaine 1% used for infiltration and patient sedated Hand hygiene performed , maximum sterile barriers used  and Seldinger technique used Catheter size: 8.5 Fr Central line was placed.Sheath introducer Procedure performed using ultrasound guided technique. Ultrasound Notes:anatomy identified, needle tip was noted to be adjacent to the nerve/plexus identified, no ultrasound evidence of intravascular and/or intraneural injection and image(s) printed for medical record Attempts: 2 (First attempt on right IJ unable to thread wire beyond ~15cm) Following insertion, line sutured, dressing applied and Biopatch. Post procedure assessment: blood return through all ports, free fluid flow and no air  Patient tolerated the procedure well with no immediate complications.

## 2019-10-12 NOTE — Progress Notes (Signed)
Extubated to 2 LPM c/o being cold. Warm blankets applied.

## 2019-10-12 NOTE — Anesthesia Procedure Notes (Signed)
Arterial Line Insertion Start/End11/02/2019 6:45 AM Performed by: Alain Marion, CRNA, CRNA  Preanesthetic checklist: patient identified, IV checked, site marked, risks and benefits discussed, surgical consent, monitors and equipment checked, pre-op evaluation, timeout performed and anesthesia consent Lidocaine 1% used for infiltration Left, radial was placed Catheter size: 20 G Hand hygiene performed  and maximum sterile barriers used   Attempts: 1 Procedure performed without using ultrasound guided technique. Following insertion, dressing applied and Biopatch. Post procedure assessment: normal  Patient tolerated the procedure well with no immediate complications.

## 2019-10-12 NOTE — Anesthesia Procedure Notes (Signed)
Central Venous Catheter Insertion Performed by: Audry Pili, MD, anesthesiologist Start/End11/02/2019 7:22 AM, 10/12/2019 7:24 AM Patient location: Pre-op. Preanesthetic checklist: patient identified, IV checked, risks and benefits discussed, surgical consent, monitors and equipment checked, pre-op evaluation, timeout performed and anesthesia consent Position: Trendelenburg Hand hygiene performed  and maximum sterile barriers used  Total catheter length 10. PA cath was placed.Swan type:thermodilution PA Cath depth:52 Procedure performed without using ultrasound guided technique. Attempts: 1 Patient tolerated the procedure well with no immediate complications.

## 2019-10-12 NOTE — Progress Notes (Signed)
Pre Procedure note for inpatients:   Fred [name] has been scheduled for Procedure(s): CORONARY ARTERY BYPASS GRAFTING (CABG) (N/A) TRANSESOPHAGEAL ECHOCARDIOGRAM (TEE) (N/A) today. The various methods of treatment have been discussed with the patient. After consideration of the risks, benefits and treatment options the patient has consented to the planned procedure.   The patient has been seen and labs reviewed. There are no changes in the patient's condition to prevent proceeding with the planned procedure today.  Recent labs:  Lab Results  Component Value Date   WBC 6.9 10/12/2019   HGB 14.0 10/12/2019   HCT 42.8 10/12/2019   PLT 242 10/12/2019   GLUCOSE 187 (H) 10/12/2019   CHOL  10/24/2010    117        ATP III CLASSIFICATION:  <200     mg/dL   Desirable  200-239  mg/dL   Borderline High  >=240    mg/dL   High          TRIG 92 10/24/2010   HDL 31 (L) 10/24/2010   LDLCALC  10/24/2010    68        Total Cholesterol/HDL:CHD Risk Coronary Heart Disease Risk Table                     Men   Women  1/2 Average Risk   3.4   3.3  Average Risk       5.0   4.4  2 X Average Risk   9.6   7.1  3 X Average Risk  23.4   11.0        Use the calculated Patient Ratio above and the CHD Risk Table to determine the patient's CHD Risk.        ATP III CLASSIFICATION (LDL):  <100     mg/dL   Optimal  100-129  mg/dL   Near or Above                    Optimal  130-159  mg/dL   Borderline  160-189  mg/dL   High  >190     mg/dL   Very High   ALT 17 10/12/2019   AST 16 10/12/2019   NA 138 10/12/2019   K 4.5 10/12/2019   CL 102 10/12/2019   CREATININE 1.17 10/12/2019   BUN 18 10/12/2019   CO2 26 10/12/2019   TSH 6.220 (H) 10/05/2019   INR 1.1 10/12/2019   HGBA1C 9.0 (H) 10/11/2019    Len Childs, MD 10/12/2019 7:32 AM

## 2019-10-12 NOTE — Brief Op Note (Addendum)
10/11/2019 - 10/12/2019  11:49 AM  PATIENT:  Sherald Hess  78 y.o. male  PRE-OPERATIVE DIAGNOSIS:  CAD  POST-OPERATIVE DIAGNOSIS:  CAD  PROCEDURE:  TRANSESOPHAGEAL ECHOCARDIOGRAM (TEE), MEDIAN STERNOTOMY for  CORONARY ARTERY BYPASS GRAFTING (CABG) x 4 (LIMA to LAD, SVG to OM, SVG to RAMUS INTERMEDIATE, SVG to PDA) with EVH from [place] and LEFT INTERNAL MAMMARY ARTERY HARVEST  SURGEON:  Surgeon(s) and Role:    Ivin Poot, MD - Primary  PHYSICIAN ASSISTANT: Lars Pinks PA-C  ASSISTANTS: Vernie Murders RNFA  ANESTHESIA:   general  EBL:  Per anesthesia and perfusion record   DRAINS: Chest tubes placed in the mediastinal and pleural spaces   COUNTS CORRECT:  YES  DICTATION: .Dragon Dictation  PLAN OF CARE: Admit to inpatient   PATIENT DISPOSITION:  ICU - intubated and hemodynamically stable.   Delay start of Pharmacological VTE agent (>24hrs) due to surgical blood loss or risk of bleeding: yes  BASELINE WEIGHT: 89 kg  patient examined and medical record reviewed,agree with above note. Tharon Aquas Trigt III 10/12/2019

## 2019-10-12 NOTE — Anesthesia Procedure Notes (Signed)
Procedure Name: Intubation Date/Time: 10/12/2019 7:47 AM Performed by: Alain Marion, CRNA Pre-anesthesia Checklist: Patient identified, Emergency Drugs available, Suction available and Patient being monitored Patient Re-evaluated:Patient Re-evaluated prior to induction Oxygen Delivery Method: Circle System Utilized Preoxygenation: Pre-oxygenation with 100% oxygen Induction Type: IV induction Ventilation: Mask ventilation without difficulty Laryngoscope Size: Miller and 2 Grade View: Grade I Tube type: Oral Tube size: 7.5 mm Number of attempts: 1 Airway Equipment and Method: Stylet Placement Confirmation: ETT inserted through vocal cords under direct vision,  positive ETCO2 and breath sounds checked- equal and bilateral Secured at: 22 cm Tube secured with: Tape Dental Injury: Teeth and Oropharynx as per pre-operative assessment

## 2019-10-12 NOTE — Progress Notes (Signed)
Awakened, weaning precedex down. Able to follow commands, on a rate of 4 via vent . Wife at bedside explaining procedures as they happen. Albumin for decreased BP. NTG off.

## 2019-10-12 NOTE — Progress Notes (Signed)
  Echocardiogram Echocardiogram Transesophageal has been performed.  Johny Chess 10/12/2019, 9:05 AM

## 2019-10-12 NOTE — Plan of Care (Signed)
Post CABG extunbation 3 hours post doing well hemodynamically.  ND Potential impaired gas exchange related to atelectasis  1 use IS q! H Dangle at bedside Up in chair post op day 12 follow mobility guidelines for post op Cough deep breath frequently Pain management to avoid splinting

## 2019-10-12 NOTE — Procedures (Signed)
Extubation Procedure Note  Patient Details:   Name: GEOFREY SILLIMAN DOB: 12-11-1940 MRN: [id number]   Airway Documentation:    Vent end date: 10/12/19 Vent end time: 1655   Evaluation  O2 sats: stable throughout Complications: No apparent complications Patient did tolerate procedure well. Bilateral Breath Sounds: Clear, Diminished   Yes   Patient extubated per order to 4L [place] with no apparent complications. Positive cuff leak was noted prior to extubation. Patient achieved NIF of -28 and VC of .7L with good effort. Patient is alert and is able to weakly speak. Vitals are stable. RT will continue to monitor.   Sparrow Sanzo Clyda Greener 10/12/2019, 5:05 PM

## 2019-10-13 ENCOUNTER — Encounter (HOSPITAL_COMMUNITY): Payer: Self-pay | Admitting: Cardiothoracic Surgery

## 2019-10-13 ENCOUNTER — Inpatient Hospital Stay (HOSPITAL_COMMUNITY): Payer: Medicare Other

## 2019-10-13 LAB — GLUCOSE, CAPILLARY
Glucose-Capillary: 100 mg/dL — ABNORMAL HIGH (ref 70–99)
Glucose-Capillary: 101 mg/dL — ABNORMAL HIGH (ref 70–99)
Glucose-Capillary: 101 mg/dL — ABNORMAL HIGH (ref 70–99)
Glucose-Capillary: 102 mg/dL — ABNORMAL HIGH (ref 70–99)
Glucose-Capillary: 109 mg/dL — ABNORMAL HIGH (ref 70–99)
Glucose-Capillary: 109 mg/dL — ABNORMAL HIGH (ref 70–99)
Glucose-Capillary: 125 mg/dL — ABNORMAL HIGH (ref 70–99)
Glucose-Capillary: 126 mg/dL — ABNORMAL HIGH (ref 70–99)
Glucose-Capillary: 129 mg/dL — ABNORMAL HIGH (ref 70–99)
Glucose-Capillary: 134 mg/dL — ABNORMAL HIGH (ref 70–99)
Glucose-Capillary: 154 mg/dL — ABNORMAL HIGH (ref 70–99)
Glucose-Capillary: 158 mg/dL — ABNORMAL HIGH (ref 70–99)
Glucose-Capillary: 173 mg/dL — ABNORMAL HIGH (ref 70–99)
Glucose-Capillary: 181 mg/dL — ABNORMAL HIGH (ref 70–99)
Glucose-Capillary: 191 mg/dL — ABNORMAL HIGH (ref 70–99)

## 2019-10-13 LAB — COOXEMETRY PANEL
Carboxyhemoglobin: 1.2 % (ref 0.5–1.5)
Methemoglobin: 0.7 % (ref 0.0–1.5)
O2 Saturation: 64.4 %
Total hemoglobin: 9.1 g/dL — ABNORMAL LOW (ref 12.0–16.0)

## 2019-10-13 LAB — CBC
HCT: 33.6 % — ABNORMAL LOW (ref 39.0–52.0)
HCT: 32.1 % — ABNORMAL LOW (ref 39.0–52.0)
Hemoglobin: 11.2 g/dL — ABNORMAL LOW (ref 13.0–17.0)
Hemoglobin: 10.9 g/dL — ABNORMAL LOW (ref 13.0–17.0)
MCH: 31.5 pg (ref 26.0–34.0)
MCH: 32.1 pg (ref 26.0–34.0)
MCHC: 33.3 g/dL (ref 30.0–36.0)
MCHC: 34 g/dL (ref 30.0–36.0)
MCV: 94.6 fL (ref 80.0–100.0)
MCV: 94.4 fL (ref 80.0–100.0)
Platelets: 176 10*3/uL (ref 150–400)
Platelets: 144 10*3/uL — ABNORMAL LOW (ref 150–400)
RBC: 3.55 MIL/uL — ABNORMAL LOW (ref 4.22–5.81)
RBC: 3.4 MIL/uL — ABNORMAL LOW (ref 4.22–5.81)
RDW: 13.1 % (ref 11.5–15.5)
RDW: 13.2 % (ref 11.5–15.5)
WBC: 15.8 10*3/uL — ABNORMAL HIGH (ref 4.0–10.5)
WBC: 16.1 10*3/uL — ABNORMAL HIGH (ref 4.0–10.5)
nRBC: 0 % (ref 0.0–0.2)
nRBC: 0 % (ref 0.0–0.2)

## 2019-10-13 LAB — POCT I-STAT 7, (LYTES, BLD GAS, ICA,H+H)
Acid-base deficit: 1 mmol/L (ref 0.0–2.0)
Bicarbonate: 22.5 mmol/L (ref 20.0–28.0)
Calcium, Ion: 1.1 mmol/L — ABNORMAL LOW (ref 1.15–1.40)
HCT: 32 % — ABNORMAL LOW (ref 39.0–52.0)
Hemoglobin: 10.9 g/dL — ABNORMAL LOW (ref 13.0–17.0)
O2 Saturation: 95 %
Potassium: 4.2 mmol/L (ref 3.5–5.1)
Sodium: 137 mmol/L (ref 135–145)
TCO2: 24 mmol/L (ref 22–32)
pCO2 arterial: 32.5 mmHg (ref 32.0–48.0)
pH, Arterial: 7.45 (ref 7.350–7.450)
pO2, Arterial: 70 mmHg — ABNORMAL LOW (ref 83.0–108.0)

## 2019-10-13 LAB — BASIC METABOLIC PANEL
Anion gap: 11 (ref 5–15)
Anion gap: 8 (ref 5–15)
BUN: 11 mg/dL (ref 8–23)
BUN: 14 mg/dL (ref 8–23)
CO2: 21 mmol/L — ABNORMAL LOW (ref 22–32)
CO2: 24 mmol/L (ref 22–32)
Calcium: 8.1 mg/dL — ABNORMAL LOW (ref 8.9–10.3)
Calcium: 7.9 mg/dL — ABNORMAL LOW (ref 8.9–10.3)
Chloride: 106 mmol/L (ref 98–111)
Chloride: 103 mmol/L (ref 98–111)
Creatinine, Ser: 1.06 mg/dL (ref 0.61–1.24)
Creatinine, Ser: 1.2 mg/dL (ref 0.61–1.24)
GFR calc Af Amer: >60 mL/min (ref >60)
GFR calc Af Amer: >60 mL/min (ref >60)
GFR calc non Af Amer: >60 mL/min (ref >60)
GFR calc non Af Amer: 57 mL/min — ABNORMAL LOW (ref >60)
Glucose, Bld: 121 mg/dL — ABNORMAL HIGH (ref 70–99)
Glucose, Bld: 181 mg/dL — ABNORMAL HIGH (ref 70–99)
Potassium: 4.4 mmol/L (ref 3.5–5.1)
Potassium: 4.1 mmol/L (ref 3.5–5.1)
Sodium: 138 mmol/L (ref 135–145)
Sodium: 135 mmol/L (ref 135–145)

## 2019-10-13 LAB — MAGNESIUM
Magnesium: 2.3 mg/dL (ref 1.7–2.4)
Magnesium: 2.4 mg/dL (ref 1.7–2.4)

## 2019-10-13 MED ORDER — FUROSEMIDE 10 MG/ML IJ SOLN
20.0000 mg | Freq: Two times a day (BID) | INTRAMUSCULAR | Status: DC
  Administered 2019-10-13 (×2): 20 mg via INTRAVENOUS
  Filled 2019-10-13 (×2): qty 2

## 2019-10-13 MED ORDER — FUROSEMIDE 10 MG/ML IJ SOLN
20.0000 mg | Freq: Four times a day (QID) | INTRAMUSCULAR | Status: DC
  Administered 2019-10-14 (×2): 20 mg via INTRAVENOUS
  Filled 2019-10-13 (×2): qty 2

## 2019-10-13 MED ORDER — LIVING WELL WITH DIABETES BOOK
Freq: Once | Status: AC
  Administered 2019-10-13: 15:00:00
  Filled 2019-10-13: qty 1

## 2019-10-13 MED ORDER — INSULIN DETEMIR 100 UNIT/ML ~~LOC~~ SOLN
10.0000 [IU] | Freq: Every day | SUBCUTANEOUS | Status: DC
  Filled 2019-10-13: qty 0.1

## 2019-10-13 MED ORDER — INSULIN ASPART 100 UNIT/ML ~~LOC~~ SOLN
0.0000 [IU] | SUBCUTANEOUS | Status: DC
  Administered 2019-10-13 (×2): 4 [IU] via SUBCUTANEOUS
  Administered 2019-10-14: 11 [IU] via SUBCUTANEOUS
  Administered 2019-10-14: 4 [IU] via SUBCUTANEOUS
  Administered 2019-10-14: 11 [IU] via SUBCUTANEOUS
  Administered 2019-10-14 (×2): 4 [IU] via SUBCUTANEOUS
  Administered 2019-10-14: 7 [IU] via SUBCUTANEOUS
  Administered 2019-10-15: 4 [IU] via SUBCUTANEOUS
  Administered 2019-10-15: 3 [IU] via SUBCUTANEOUS
  Administered 2019-10-15 (×2): 4 [IU] via SUBCUTANEOUS
  Administered 2019-10-15: 7 [IU] via SUBCUTANEOUS
  Administered 2019-10-15 (×2): 4 [IU] via SUBCUTANEOUS
  Administered 2019-10-16 (×2): 3 [IU] via SUBCUTANEOUS

## 2019-10-13 MED ORDER — INSULIN DETEMIR 100 UNIT/ML ~~LOC~~ SOLN
8.0000 [IU] | Freq: Every day | SUBCUTANEOUS | Status: DC
  Administered 2019-10-13: 8 [IU] via SUBCUTANEOUS
  Filled 2019-10-13 (×2): qty 0.08

## 2019-10-13 NOTE — Op Note (Signed)
NAME: Fred [name], Fred [name] MEDICAL RECORD [id number] ACCOUNT [id number] DATE OF BIRTH:[birth year] FACILITY: [facility] LOCATION: [facility] PHYSICIAN: [name] III, MD  OPERATIVE REPORT  DATE OF PROCEDURE:  10/12/2019  OPERATION: 1.  Coronary artery bypass grafting x4 (left internal mammary artery to left anterior descending, saphenous vein graft to posterior descending, saphenous vein graft to obtuse marginal 1, saphenous vein graft to ramus intermediate). 2.  Endoscopic harvest of right leg greater saphenous vein.  SURGEON:  Ivin Poot, MD  ASSISTANT:  Lars Pinks, PA-C.  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  CLINICAL NOTE:  The patient is a retired 78 year old diabetic who presented with accelerating symptoms of chest pain, shortness of breath with exertion and decreased exercise tolerance.  A stress test-cardiac CT scan showed evidence of coronary artery  disease and followup cardiac catheterization by Dr. Quay Burow documented severe 3-vessel coronary disease with 90% stenosis of the descending, 90% stenosis of the circumflex and 90% stenosis of the RCA.  LV function was fairly well preserved.  An  echocardiogram showed no significant valvular disease other than mild aortic insufficiency.  The patient was felt to be a candidate for surgical revascularization.  I saw the patient in consultation.  After reviewing his cardiac cath and echo images, I  felt that the patient would benefit from coronary bypass grafting.  I discussed the procedure of CABG in detail with the patient.  I also discussed the benefits of improved symptoms, preservation of LV function and improved survival.  I discussed the  risks of stroke, bleeding, infection, organ failure, arrhythmia and death.  He agreed to proceed with surgery  under what I felt was an informed consent.  DESCRIPTION OF PROCEDURE:  The patient was brought from preop holding where informed consent was documented.  Final questions addressed with the patient.  He was placed supine on the operating table and general anesthesia was induced.  He remained  stable.  A transesophageal echo probe was placed by the anesthesia team.  The patient was prepped and draped as a sterile field.  A proper time-out was performed.  A sternal incision was made as the saphenous vein was harvested endoscopically from the  right leg.  The left internal mammary artery was a 1.5 mm vessel with good flow and was taken down as a pedicle graft from its origin at the subclavian vessels.  The sternal retractor was placed and the pericardium was opened and suspended.  The  ascending aorta was palpated and examined and had minimal calcification.  Pursestrings were placed in the ascending aorta and right atrium.  Heparin was administered.  The ACT was documented as being therapeutic.  The patient was then cannulated and  placed on bypass.  The coronaries were identified for grafting.  The LAD, posterior descending and OM were adequate targets.  The ramus was a smaller, but adequate target.  The mammary artery and vein grafts were prepared for the distal anastomoses and  cardioplegia cannulas were placed for both antegrade and retrograde cold blood cardioplegia.  The patient was cooled to 32 degrees and the aortic crossclamp was applied.  A liter of cold blood cardioplegia was delivered in split doses between the  antegrade aortic and retrograde coronary sinus catheters.  There was good cardioplegic arrest and septal myocardial   temperature drop less than 12 degrees.  Cardioplegia was delivered every 20  minutes.  The distal coronary anastomoses were performed.  The first distal anastomosis was the posterior descending.  There was a proximal 95% stenosis.  A reverse saphenous vein was sewn  end-to-side with running 7-0 Prolene with good flow through the graft.   Cardioplegia was redosed.  The second distal anastomosis was the OM branch of the left circumflex.  This had a proximal 90% stenosis.  A reverse saphenous vein was sewn end-to-side with running 7-0 Prolene with good flow through the graft.  Cardioplegia was redosed.  The third distal anastomosis was the ramus intermediate branch of the left circumflex.  There was a smaller 1.2 mm vessel, proximal 80% stenosis.  Reverse saphenous vein was sewn end-to-side with running 7-0 Prolene with good flow through the graft.   Cardioplegia was redosed.  The fourth distal anastomosis was the mid-LAD.  The LAD was a 1.5 mm vessel, with proximal 90% stenosis.  The left IMA pedicle was brought through an opening in the left lateral pericardium and was brought down onto the LAD and sewn end-to-side with  running 8-0 Prolene.  There was good flow through the anastomosis after briefly releasing the pedicle bulldog on the mammary artery.  The bulldog was reapplied and the pedicle was secured to the epicardium with 6-0 Prolenes.  Cardioplegia was redosed.  While the crossclamp was still in place, 3 proximal vein anastomoses were performed on the ascending aorta with a 4.5 mm punch and running 6-0 Prolene.  Prior to tying down the final proximal anastomosis, air was vented from the coronaries with a dose of  retrograde warm blood cardioplegia.  The crossclamp was removed.  The heart resumed a spontaneous rhythm.  The cardioplegia cannulas were removed.  The vein grafts were deaired and opened.  Each had good flow.  Hemostasis was documented at the proximal and distal anastomoses.  The patient was rewarmed and reperfused.   Temporary pacing wires were applied.  The lungs were expanded and the ventilator was resumed.  The patient was then weaned off cardiopulmonary bypass without difficulty on minimal low-dose milrinone.  Cardiac output and hemodynamics  were satisfactory.   The patient was given protamine without adverse reaction.  The cannulas were removed.  The patient was still somewhat coagulopathic and extensive efforts were taken for obtaining operative hemostasis.  The pericardium was closed over the aorta and vein  grafts.  The anterior mediastinal and the left pleural chest tubes were placed and brought out through separate incisions.  The sternum was then closed with steel wire.  The patient remained stable.  The pectoralis fascia was closed with a running #1  Vicryl.  The subcutaneous and skin layers were closed with running Vicryl and sterile dressings were applied.  Total cardiopulmonary bypass time was 144 minutes.  VN/NUANCE  D:10/12/2019 T:10/12/2019 JOB:[id number]

## 2019-10-13 NOTE — Progress Notes (Signed)
CT surgery p.m. rounds  Patient resting comfortably with slight wheeze Extra Lasix dosing ordered Patient walked around the ICU hallway Sinus rhythm, O2 sat 93% PM labs satisfactory Extra Lasix ordered

## 2019-10-13 NOTE — Progress Notes (Addendum)
[facility] DAILY ICU PROGRESS NOTE                   [address]            [place] [id number]          [phone]   1 Day Post-Op Procedure(s) (LRB): CORONARY ARTERY BYPASS GRAFTING (CABG) x 4 (LIMA to LAD, SVG to OM, SVG to RAMUS INTERMEDIATE, SVG to PDA) with EVH from RIGHT GREATER SAPHENOUS VEIN and LEFT INTERNAL MAMMARY ARTERY HARVEST (N/A) TRANSESOPHAGEAL ECHOCARDIOGRAM (TEE) (N/A)  Total Length of Stay:  LOS: 2 days   Subjective: Patient give medication for pain so he is a little sleepy.  Objective: Vital signs in last 24 hours: Temp:  [96.3 F (35.7 C)-101.8 F (38.8 C)] 99.7 F (37.6 C) (11/04 0700) Pulse Rate:  [87-113] 96 (11/04 0700) Cardiac Rhythm: Normal sinus rhythm (11/04 0400) Resp:  [12-24] 19 (11/04 0700) BP: (104-131)/(64-72) 109/66 (11/04 0500) SpO2:  [91 %-100 %] 93 % (11/04 0700) Arterial Line BP: (94-149)/(44-77) 119/44 (11/04 0600) FiO2 (%):  [36 %-50 %] 36 % (11/03 1656) Weight:  [96.2 kg] 96.2 kg (11/04 0500)  Filed Weights   10/11/19 0941 10/11/19 1259 10/13/19 0500  Weight: 90.7 kg 89.4 kg 96.2 kg    Weight change: 5.481 kg   Hemodynamic parameters for last 24 hours: PAP: (24-45)/(9-29) 29/9 CO:  [5.1 L/min-6.1 L/min] 5.7 L/min CI:  [2.5 L/min/m2-3 L/min/m2] 2.8 L/min/m2  Intake/Output from previous day: 11/03 0701 - 11/04 0700 In: 4059.8 [I.V.:2612.1; Blood:490; IV Piggyback:957.7] Out: 9147 [[id number] [Urine:3645; Blood:870; Chest Tube:600]  Intake/Output this shift: No intake/output data recorded.  Current Meds: Scheduled Meds: . acetaminophen  1,000 mg Oral Q6H   Or  . acetaminophen (TYLENOL) oral liquid 160 mg/5 mL  1,000 mg Per Tube Q6H  . aspirin EC  325 mg Oral Daily   Or  . aspirin  324 mg Per Tube Daily  . bisacodyl  10 mg Oral Daily   Or  . bisacodyl  10 mg Rectal Daily  . Chlorhexidine Gluconate Cloth  6 each Topical Daily  . darifenacin  7.5 mg Oral Daily  . docusate sodium  200 mg Oral Daily  . furosemide   20 mg Intravenous BID  . insulin regular  0-10 Units Intravenous TID WC  . metoCLOPramide (REGLAN) injection  10 mg Intravenous Q6H  . metoprolol tartrate  12.5 mg Oral BID   Or  . metoprolol tartrate  12.5 mg Per Tube BID  . [START ON 10/14/2019] pantoprazole  40 mg Oral Daily  . simvastatin  40 mg Oral q1800  . sodium chloride flush  3 mL Intravenous Q12H   Continuous Infusions: . sodium chloride    . sodium chloride    . sodium chloride    . albumin human 60 mL/hr at 10/13/19 0400  . cefUROXime (ZINACEF)  IV 1.5 g (10/13/19 0740)  . insulin 2.9 Units/hr (10/13/19 0736)  . lactated ringers    . lactated ringers 20 mL/hr at 10/13/19 0727  . nitroGLYCERIN Stopped (10/12/19 1542)  . phenylephrine (NEO-SYNEPHRINE) Adult infusion Stopped (10/12/19 2316)  . vancomycin Stopped (10/12/19 2303)   PRN Meds:.sodium chloride, albumin human, metoprolol tartrate, midazolam, morphine injection, ondansetron (ZOFRAN) IV, oxyCODONE, sodium chloride flush, traMADol  General appearance: cooperative and no distress Neurologic: intact Heart: RRR Lungs: Diminished bibasilar breath sounds Abdomen: Soft, protuberant, non tender, spordic bowel sounds Extremities: Mild LE edema Wound: Aquacel intact;RLE dressings are clean and dry  Lab  Results: CBC: Recent Labs    10/12/19 1945  10/13/19 0314 10/13/19 0327  WBC 13.1*  --   --  15.8*  HGB 11.4*   < > 10.9* 11.2*  HCT 32.9*   < > 32.0* 33.6*  PLT 157  --   --  176   < > = values in this interval not displayed.   BMET:  Recent Labs    10/12/19 1945  10/13/19 0314 10/13/19 0327  NA 138   < > 137 138  K 4.5   < > 4.2 4.4  CL 109  --   --  106  CO2 21*  --   --  21*  GLUCOSE 149*  --   --  121*  BUN 9  --   --  11  CREATININE 0.91  --   --  1.06  CALCIUM 7.9*  --   --  8.1*   < > = values in this interval not displayed.    CMET: Lab Results  Component Value Date   WBC 15.8 (H) 10/13/2019   HGB 11.2 (L) 10/13/2019   HCT 33.6 (L)  10/13/2019   PLT 176 10/13/2019   GLUCOSE 121 (H) 10/13/2019   CHOL  10/24/2010    117        ATP III CLASSIFICATION:  <200     mg/dL   Desirable  200-239  mg/dL   Borderline High  >=240    mg/dL   High          TRIG 92 10/24/2010   HDL 31 (L) 10/24/2010   LDLCALC  10/24/2010    68        Total Cholesterol/HDL:CHD Risk Coronary Heart Disease Risk Table                     Men   Women  1/2 Average Risk   3.4   3.3  Average Risk       5.0   4.4  2 X Average Risk   9.6   7.1  3 X Average Risk  23.4   11.0        Use the calculated Patient Ratio above and the CHD Risk Table to determine the patient's CHD Risk.        ATP III CLASSIFICATION (LDL):  <100     mg/dL   Optimal  100-129  mg/dL   Near or Above                    Optimal  130-159  mg/dL   Borderline  160-189  mg/dL   High  >190     mg/dL   Very High   ALT 17 10/12/2019   AST 16 10/12/2019   NA 138 10/13/2019   K 4.4 10/13/2019   CL 106 10/13/2019   CREATININE 1.06 10/13/2019   BUN 11 10/13/2019   CO2 21 (L) 10/13/2019   TSH 6.220 (H) 10/05/2019   INR 1.4 (H) 10/12/2019   HGBA1C 9.0 (H) 10/11/2019      PT/INR:  Recent Labs    10/12/19 1430  LABPROT 17.3*  INR 1.4*   Radiology: Dg Chest Port 1 View  Result Date: 10/13/2019 CLINICAL DATA:  Chest tube placement EXAM: PORTABLE CHEST 1 VIEW COMPARISON:  October 12, 2019 FINDINGS: The Swan-Ganz catheter tip projects over the main pulmonary artery. A left-sided chest tube is in place. There is a mediastinal drain in place. No left-sided pneumothorax. The lung  volumes are low. Postoperative bibasilar atelectasis is noted. There is pulmonary edema. There is no acute osseous abnormality. The enteric tube has been removed. IMPRESSION: 1. Lines and tubes as above. 2. Otherwise, no significant oval change. Electronically Signed   By: Katherine Mantle M.D.   On: 10/13/2019 05:58   Dg Chest Port 1 View  Result Date: 10/12/2019 CLINICAL DATA:  Status post CABG.  EXAM: PORTABLE CHEST 1 VIEW COMPARISON:  10/11/2019 FINDINGS: Endotracheal tube tip is above the carina. There is a mediastinal drain and left chest tube in place. Left internal jugular PA catheter is noted with tip projecting over the right ventricular outflow tract. NG tube is in place with tip just below the GE junction. Normal heart size. Mild pulmonary edema. No pleural effusion. No significant pneumothorax identified. IMPRESSION: 1. Support apparatus positioned as above. 2. Mild pulmonary edema. Electronically Signed   By: Signa Kell M.D.   On: 10/12/2019 14:21     Assessment/Plan: S/P Procedure(s) (LRB): CORONARY ARTERY BYPASS GRAFTING (CABG) x 4 (LIMA to LAD, SVG to OM, SVG to RAMUS INTERMEDIATE, SVG to PDA) with EVH from RIGHT GREATER SAPHENOUS VEIN and LEFT INTERNAL MAMMARY ARTERY HARVEST (N/A) TRANSESOPHAGEAL ECHOCARDIOGRAM (TEE) (N/A)   1. CV-SR with HR in the 90's. On Milrinone drip, which was just stopped and co ox this am 64.4. Will check co ox this afternoon.  2. Pulmonary-on 2-3 liters of oxygen via . Will wean as able. Chest tubes with 600 cc of output since surgery. CXR this am shows low lung volumes, no pneumothorax, and pulmonary edema. Encourage incentive spirometer.  3. Expected ABL anemia-H and H this am stable at 11.2 and 33.6 4. Volume overload-Lasix ordered 5. DM-CBGs 126/100/101. Will transition off Insulin drip. Pre op HGA1C  6. Remove a line, swan, please see progression orders    Ardelle Balls 10/13/2019 7:43 AM   Patient examined, morning chest x-ray personally reviewed Doing well after multivessel CABG for unstable angina.  Minimal postsurgical pain. Hemodynamic stable, will stop milrinone Maintaining sinus rhythm, role and tape pacing wires Watch chest tube output and possibly remove later in the day after he mobilize out of bed.  Transition from IV insulin to sliding scale every 4 hours  patient examined and medical record reviewed,agree with  above note. Kathlee Nations Trigt III 10/13/2019

## 2019-10-13 NOTE — Anesthesia Postprocedure Evaluation (Signed)
Anesthesia Post Note  Patient: Fred [name]  Procedure(s) Performed: CORONARY ARTERY BYPASS GRAFTING (CABG) x 4 (LIMA to LAD, SVG to OM, SVG to RAMUS INTERMEDIATE, SVG to PDA) with EVH from [place] and LEFT INTERNAL MAMMARY ARTERY HARVEST (N/A Chest) TRANSESOPHAGEAL ECHOCARDIOGRAM (TEE) (N/A )     Patient location during evaluation: ICU Anesthesia Type: General Level of consciousness: sedated and patient remains intubated per anesthesia plan Pain management: pain level controlled Vital Signs Assessment: post-procedure vital signs reviewed and stable Respiratory status: patient remains intubated per anesthesia plan Cardiovascular status: stable Postop Assessment: no apparent nausea or vomiting Anesthetic complications: no    Last Vitals:  Vitals:   10/13/19 1700 10/13/19 1800  BP: 136/80 111/70  Pulse: 83 87  Resp: (!) 25 18  Temp:    SpO2: 94% 93%    Last Pain:  Vitals:   10/13/19 1758  TempSrc:   PainSc: Fred [name]

## 2019-10-14 ENCOUNTER — Inpatient Hospital Stay (HOSPITAL_COMMUNITY): Payer: Medicare Other

## 2019-10-14 ENCOUNTER — Other Ambulatory Visit: Payer: Self-pay

## 2019-10-14 LAB — TYPE AND SCREEN
ABO/RH(D): A POS
Antibody Screen: NEGATIVE
Unit division: 0
Unit division: 0

## 2019-10-14 LAB — BPAM RBC
Blood Product Expiration Date: 202011292359
Blood Product Expiration Date: 202011292359
Unit Type and Rh: 6200
Unit Type and Rh: 6200

## 2019-10-14 LAB — GLUCOSE, CAPILLARY
Glucose-Capillary: 155 mg/dL — ABNORMAL HIGH (ref 70–99)
Glucose-Capillary: 164 mg/dL — ABNORMAL HIGH (ref 70–99)
Glucose-Capillary: 169 mg/dL — ABNORMAL HIGH (ref 70–99)
Glucose-Capillary: 240 mg/dL — ABNORMAL HIGH (ref 70–99)
Glucose-Capillary: 268 mg/dL — ABNORMAL HIGH (ref 70–99)
Glucose-Capillary: 293 mg/dL — ABNORMAL HIGH (ref 70–99)

## 2019-10-14 LAB — COOXEMETRY PANEL
Carboxyhemoglobin: 1.2 % (ref 0.5–1.5)
Methemoglobin: 0.9 % (ref 0.0–1.5)
O2 Saturation: 64.2 %
Total hemoglobin: 11.5 g/dL — ABNORMAL LOW (ref 12.0–16.0)

## 2019-10-14 LAB — CBC
HCT: 34.6 % — ABNORMAL LOW (ref 39.0–52.0)
Hemoglobin: 11.5 g/dL — ABNORMAL LOW (ref 13.0–17.0)
MCH: 31.7 pg (ref 26.0–34.0)
MCHC: 33.2 g/dL (ref 30.0–36.0)
MCV: 95.3 fL (ref 80.0–100.0)
Platelets: 140 10*3/uL — ABNORMAL LOW (ref 150–400)
RBC: 3.63 MIL/uL — ABNORMAL LOW (ref 4.22–5.81)
RDW: 13.3 % (ref 11.5–15.5)
WBC: 15 10*3/uL — ABNORMAL HIGH (ref 4.0–10.5)
nRBC: 0 % (ref 0.0–0.2)

## 2019-10-14 LAB — BASIC METABOLIC PANEL
Anion gap: 8 (ref 5–15)
BUN: 15 mg/dL (ref 8–23)
CO2: 25 mmol/L (ref 22–32)
Calcium: 8.1 mg/dL — ABNORMAL LOW (ref 8.9–10.3)
Chloride: 102 mmol/L (ref 98–111)
Creatinine, Ser: 1.39 mg/dL — ABNORMAL HIGH (ref 0.61–1.24)
GFR calc Af Amer: 55 mL/min — ABNORMAL LOW (ref >60)
GFR calc non Af Amer: 48 mL/min — ABNORMAL LOW (ref >60)
Glucose, Bld: 178 mg/dL — ABNORMAL HIGH (ref 70–99)
Potassium: 5.3 mmol/L — ABNORMAL HIGH (ref 3.5–5.1)
Sodium: 135 mmol/L (ref 135–145)

## 2019-10-14 MED ORDER — POTASSIUM CHLORIDE CRYS ER 20 MEQ PO TBCR: 20.0000 meq | EXTENDED_RELEASE_TABLET | Freq: Two times a day (BID) | ORAL | Status: DC

## 2019-10-14 MED ORDER — ENOXAPARIN SODIUM 40 MG/0.4ML ~~LOC~~ SOLN
40.0000 mg | SUBCUTANEOUS | Status: DC
  Administered 2019-10-14 – 2019-10-20 (×7): 40 mg via SUBCUTANEOUS
  Filled 2019-10-14 (×7): qty 0.4

## 2019-10-14 MED ORDER — SORBITOL 70 % SOLN
30.0000 mL | Freq: Once | Status: AC
  Administered 2019-10-14: 30 mL via ORAL
  Filled 2019-10-14: qty 30

## 2019-10-14 MED ORDER — METOPROLOL TARTRATE 25 MG/10 ML ORAL SUSPENSION: 25.0000 mg | Freq: Two times a day (BID) | ORAL | Status: DC

## 2019-10-14 MED ORDER — SORBITOL 70 % PO SOLN: 30.0000 mL | Freq: Once | ORAL | Status: DC

## 2019-10-14 MED ORDER — AMIODARONE HCL IN DEXTROSE 360-4.14 MG/200ML-% IV SOLN
60.0000 mg/h | INTRAVENOUS | Status: AC
  Administered 2019-10-14 (×2): 60 mg/h via INTRAVENOUS
  Filled 2019-10-14: qty 200

## 2019-10-14 MED ORDER — AMIODARONE HCL IN DEXTROSE 360-4.14 MG/200ML-% IV SOLN
INTRAVENOUS | Status: AC
  Administered 2019-10-14: 150 mg via INTRAVENOUS
  Filled 2019-10-14: qty 200

## 2019-10-14 MED ORDER — METOPROLOL TARTRATE 25 MG PO TABS: 25.0000 mg | ORAL_TABLET | Freq: Two times a day (BID) | ORAL | Status: DC

## 2019-10-14 MED ORDER — MORPHINE SULFATE (PF) 2 MG/ML IV SOLN
2.0000 mg | INTRAVENOUS | Status: DC | PRN
  Administered 2019-10-15: 2 mg via INTRAVENOUS
  Filled 2019-10-14: qty 1

## 2019-10-14 MED ORDER — HYDROCORTISONE NA SUCCINATE PF 100 MG IJ SOLR
100.0000 mg | Freq: Once | INTRAMUSCULAR | Status: AC
  Administered 2019-10-14: 100 mg via INTRAVENOUS
  Filled 2019-10-14: qty 2

## 2019-10-14 MED ORDER — INSULIN DETEMIR 100 UNIT/ML ~~LOC~~ SOLN
12.0000 [IU] | Freq: Every day | SUBCUTANEOUS | Status: DC
  Administered 2019-10-14: 12 [IU] via SUBCUTANEOUS
  Filled 2019-10-14 (×2): qty 0.12

## 2019-10-14 MED ORDER — FUROSEMIDE 10 MG/ML IJ SOLN
40.0000 mg | Freq: Two times a day (BID) | INTRAMUSCULAR | Status: DC
  Administered 2019-10-14 – 2019-10-16 (×5): 40 mg via INTRAVENOUS
  Filled 2019-10-14 (×5): qty 4

## 2019-10-14 MED ORDER — LEVALBUTEROL HCL 0.63 MG/3ML IN NEBU
0.6300 mg | INHALATION_SOLUTION | Freq: Three times a day (TID) | RESPIRATORY_TRACT | Status: DC
  Administered 2019-10-14 – 2019-10-17 (×10): 0.63 mg via RESPIRATORY_TRACT
  Filled 2019-10-14 (×10): qty 3

## 2019-10-14 MED ORDER — METOPROLOL TARTRATE 25 MG/10 ML ORAL SUSPENSION: 12.5000 mg | Freq: Two times a day (BID) | ORAL | Status: DC

## 2019-10-14 MED ORDER — AMIODARONE LOAD VIA INFUSION
150.0000 mg | Freq: Once | INTRAVENOUS | Status: AC
  Administered 2019-10-14: 15:00:00 150 mg via INTRAVENOUS

## 2019-10-14 MED ORDER — AMIODARONE HCL IN DEXTROSE 360-4.14 MG/200ML-% IV SOLN
30.0000 mg/h | INTRAVENOUS | Status: DC
  Administered 2019-10-14 – 2019-10-15 (×2): 30 mg/h via INTRAVENOUS
  Filled 2019-10-14: qty 200

## 2019-10-14 MED ORDER — FUROSEMIDE 10 MG/ML IJ SOLN: 40.0000 mg | Freq: Two times a day (BID) | INTRAMUSCULAR | Status: DC

## 2019-10-14 MED ORDER — METOLAZONE 5 MG PO TABS
5.0000 mg | ORAL_TABLET | Freq: Once | ORAL | Status: AC
  Administered 2019-10-14: 5 mg via ORAL
  Filled 2019-10-14: qty 1

## 2019-10-14 MED ORDER — METOPROLOL TARTRATE 12.5 MG HALF TABLET
12.5000 mg | ORAL_TABLET | Freq: Two times a day (BID) | ORAL | Status: DC
  Administered 2019-10-14: 12.5 mg via ORAL
  Filled 2019-10-14: qty 1

## 2019-10-14 NOTE — Discharge Summary (Addendum)
Physician Discharge Summary       [address]       [place] [address]             [phone]    Patient ID: Fred [name] MRN: [id number] DOB/AGE: [birth year] 78 y.o.  Admit date: 10/11/2019 Discharge date: 10/20/2019  Admission Diagnoses: Coronary artery disease  Discharge Diagnoses:  1. S/p CABG x 4  2. History of Diabetes mellitus, poorly controlled 3. History of High cholesterol 4. History of vertigo  Consults: None  Procedure (s):  1.  Coronary artery bypass grafting x4 (left internal mammary artery to left anterior descending, saphenous vein graft to posterior descending, saphenous vein graft to obtuse marginal 1, saphenous vein graft to ramus intermediate). 2.  Endoscopic harvest of right leg greater saphenous vein by Dr. [name] on 10/12/2019.  History of Presenting Illness: This is a 78 year old male with a past medical history of diabetes mellitus and hyperlipidemia. He was seen in Dr. Hazle Coca office in September. At that time, he denied chest pain or shortness of breath. He was fairly active until COVID-19 outbreak, where he became more sedentary. He tried to exercise more and after going out on a run on 08/30/2019, he had nausea, felt very weak and dizzy. He felt like he might "pass out". He then had dry heaves. After returning to home, he vomited after drinking juice. EMS was summonsed and he presented to [facility] ED for further evaluation and treatment. EKG showed SR, occasional PVCs, no acute ST/T changes. It was felt his symptoms were from poor conditioning and dehydration.   He then presented to Dr. Hazle Coca office on 09/07/2019. He did admit to some chest discomfort on his way to his office. Dr. Allyson Sabal ordered a ZIO patch to arrhythmogenic cause as well as a coronary CTA to rule out an ischemically mediated cause. CTA done 09/21/2019 showed coronary calcium score of 1619 (which was 81 percentile for age and sex matched control) as well as significant  3 vessel CAD. Echo was done on 10/06/2019. It showed LVEF 55-60%, mild AI, trivial TR, and the presence of pericardial fat pad. He then presented to [facility] today in order to undergo a cardiac catheterization done via right radial. Results showed significant stenoses of LM and LAD, Circumflex, and RCA. A cardiothoracic consultation has been requested with Dr. [name]. Patient was seen in cath lab holding area. He denies chest pain or shortness of breath and his vital signs are stable.  Dr. [name] discussed the need for coronary artery bypass grafting surgery. Potential risks, benefits, and complications of the surgery were discussed with the patient and he agreed to proceed with surgery. Pre operative carotid duplex US showed no significant internal carotid artery stenosis bilaterally. He underwent a CABG x 4 on 10/12/2019.  Brief Hospital Course:  The patient was extubated the evening of surgery without difficulty. He remained afebrile and hemodynamically stable. He was weaned off Milrinone drip. [name], a line, chest tubes, and foley were removed early in the post operative course. Lopressor was started and titrated accordingly. He was put on an Amiodarone drip (bigeminy, NOT a fib) 11/05 and was transitioned to oral Amiodarone. He was volume over loaded and diuresed. He had ABL anemia. He did not require a post op transfusion. Last H and H on 11/09 was 10.9 and 32.5. He was weaned off the insulin drip.  He was continue on Insulin. He was restarted on Metformin and Glimepiride.  The patient's glucose remained well controlled.The patient's HGA1C pre op was 9. He will require close medical follow up after discharge. The patient was felt surgically stable for transfer from the ICU to PCTU for further convalescence on 11/08. He continues to progress with cardiac rehab. He was initially requiring several liters of oxygen via [place] but was later weaned to  room air. He has been tolerating a diet and has had  a bowel movement. Epicardial pacing wires were removed on 11/09. He did have sero sanguinous proximal sternal drainage and this did decrease. He had no evidence or cellulitis or wound infection so there was no need for an antibiotic. The etiology is likely fat necrosis. The drainage stopped. As discussed with Dr. Donata ClayVan Trigt, bigeminy has resolved so will stop Amiodarone. Chest tube sutures will be removed the day of discharge. The patient is felt surgically stable for discharge today.   Latest Vital Signs: Blood pressure (!) 107/55, pulse 98, temperature 98.3 F (36.8 C), temperature source Oral, resp. rate 14, height 5\' 7"  (1.702 m), weight 88 kg, SpO2 96 %.  Physical Exam: Cardiovascular: RRR Pulmonary: Mostly clear Abdomen: Soft, non tender, bowel sounds present. Extremities: Trace bilateral lower extremity edema. Wounds: RLE wound is clean and dry.  No erythema or signs of infection. Proximal sternal wound with no further drainage  Discharge Condition: Stable and discharged to home.  Recent laboratory studies:  Lab Results  Component Value Date   WBC 10.3 10/19/2019   HGB 10.8 (L) 10/19/2019   HCT 32.7 (L) 10/19/2019   MCV 94.0 10/19/2019   PLT 375 10/19/2019   Lab Results  Component Value Date   NA 135 10/19/2019   K 4.0 10/19/2019   CL 97 (L) 10/19/2019   CO2 28 10/19/2019   CREATININE 1.30 (H) 10/19/2019   GLUCOSE 207 (H) 10/19/2019      Diagnostic Studies: Dg Chest 2 View  Result Date: 10/18/2019 CLINICAL DATA:  78 year old male status post CABG last week. Continued shortness of breath. EXAM: CHEST - 2 VIEW COMPARISON:  10/17/2019 and earlier. FINDINGS: Semi upright AP and lateral views of the chest. Stable cardiac size and mediastinal contours. Sequelae of CABG. Visualized tracheal air column is within normal limits. No pneumothorax. Small left pleural effusion. No pulmonary edema, stable vascularity. Epicardial pacer wires remain in place. No new pulmonary opacity.  Negative visible bowel gas pattern. Stable visualized osseous structures. IMPRESSION: Small left pleural effusion. No other acute cardiopulmonary abnormality. Electronically Signed   By: Odessa FlemingH  Hall M.D.   On: 10/18/2019 08:53   Ct Coronary Morph W/cta Cor W/score W/ca W/cm &/or Wo/cm  Addendum Date: 10/02/2019   ADDENDUM REPORT: 10/02/2019 09:50 EXAM: CT FFR ANALYSIS CLINICAL DATA:  78 year old male with family history of CAD requiring CABG with abnormal coronary CT FINDINGS: FFRct analysis was performed on the original cardiac CT angiogram dataset. Diagrammatic representation of the FFRct analysis is provided in a separate PDF document in PACS. This dictation was created using the PDF document and an interactive 3D model of the results. 3D model is not available in the EMR/PACS. Normal FFR range is >0.80. 1. Left Main:  No significant stenosis. 2. LAD: There is significant flow limiting stenosis (FFR 0.68-0.64) approximately 8 mm distal to moderate sized first diagonal branch. 3. LCX: FFR is 0.79 (just below normal of 0.8) after the origin of left circumflex. 4. RCA: There is significant flow limiting stenosis of proximal RCA (FFR 0.66). IMPRESSION: 1. CT FFR analysis demonstrates significant  3 vessel flow limiting CAD. Electronically Signed   By: Candee Furbish MD   On: 10/02/2019 09:50   Addendum Date: 10/01/2019   ADDENDUM REPORT: 10/01/2019 16:39 EXAM: Cardiac/Coronary  CTA TECHNIQUE: The patient was scanned on a [employer]. FINDINGS: A 100 kV prospective scan was triggered in the descending thoracic aorta at 111 HU's. Axial non-contrast 3 mm slices were carried out through the heart. The data set was analyzed on a dedicated work station and scored using the [place]. Gantry rotation speed was 250 msecs and collimation was .6 mm. 0.8 mg of sl NTG was given. The 3D data set was reconstructed in 5% intervals of the 67-82 % of the R-R cycle. Diastolic phases were analyzed on a dedicated work  station using MPR, MIP and VRT modes. The patient received 80 cc of contrast. Aorta:  Normal size.  No calcifications.  No dissection. Aortic Valve:  Trileaflet.  No calcifications. Coronary Arteries:  Normal coronary origin.  Right dominance. RCA is a large dominant artery that gives rise to PDA and PLA. There is dense calcified plaque throughout the proximal to mid vessel with proximal lesion up to 75% just following first area of proximal calcification. Left main is a large artery that gives rise to LAD and LCX arteries. LAD is a large vessel that gives rise to moderate size diagonal. There is proximal dense calcified plaque with small caliber mid and distal vessel. Potential severe lesion proximally. LCX is a non-dominant artery that gives rise to one moderate sized OM1 branch. There is dense calcification of the proximal vessel. Unable to accurately estimate level of stenosis. Other findings: 3 right sided and 2 left sided pulmonary veins drainage into the left atrium. Normal left atrial appendage without a thrombus. Normal size of the pulmonary artery. IMPRESSION: 1. Coronary calcium score of 1619. This was 36 percentile for age and sex matched control. 2. Normal coronary origin with right dominance. 3. 3 vessel coronary artery disease with what appears to be flow limiting stenosis in proximal LAD, RCA. Dense calcified plaque present. Unable to accurately estimate circumflex stenosis due to dense calcification proximally. CAD-RADS -4. 4.  No significant aortic atherosclerosis. 5. Recommend cardiac catheterization but prior to this will send for FFR analysis. Electronically Signed   By: Candee Furbish MD   On: 10/01/2019 16:39   Result Date: 10/02/2019 EXAM: OVER-READ INTERPRETATION  CT CHEST The following report is an over-read performed by radiologist Dr. Vinnie Langton of [facility] [place] on 09/21/2019. This over-read does not include interpretation of cardiac or coronary anatomy or pathology. The  coronary calcium score/coronary CTA interpretation by the cardiologist is attached. COMPARISON:  None. FINDINGS: Within the visualized portions of the thorax there are no suspicious appearing pulmonary nodules or masses, there is no acute consolidative airspace disease, no pleural effusions, no pneumothorax and no lymphadenopathy. Visualized portions of the upper abdomen are unremarkable. There are no aggressive appearing lytic or blastic lesions noted in the visualized portions of the skeleton. IMPRESSION: 1. No significant incidental noncardiac findings are noted. Electronically Signed: By: Vinnie Langton M.D. On: 09/21/2019 16:22   Dg Chest Port 1 View  Result Date: 10/17/2019 CLINICAL DATA:  Atelectasis EXAM: PORTABLE CHEST 1 VIEW COMPARISON:  10/16/2019 FINDINGS: Lingular/left lower lobe opacities, favoring atelectasis. Mild blunting of the left costophrenic angle, suggesting trace left effusion. No pneumothorax. Cardiomegaly.  Median sternotomy. IMPRESSION: Stable lingular/left lower lobe opacities, favoring atelectasis. Electronically Signed   By: Henderson Newcomer.D.  On: 10/17/2019 13:54   Dg Chest Port 1 View  Result Date: 10/16/2019 CLINICAL DATA:  Left heart catheterization 10/11/2019 and CABG 10/12/2019. EXAM: PORTABLE CHEST 1 VIEW COMPARISON:  10/15/2019 FINDINGS: Sternotomy wires unchanged. Lungs are hypoinflated with persistent patchy opacification over the left mid to lower lung and right base likely atelectasis and less likely infection. Possible small amount of left pleural fluid. Cardiomediastinal silhouette and remainder of the exam is unchanged. IMPRESSION: Persistent mild patchy opacification over the left mid to lower lung and right base likely due to atelectasis and less likely infection. Possible small amount of left pleural fluid. Electronically Signed   By: Elberta Fortis M.D.   On: 10/16/2019 10:09   Dg Chest Port 1 View  Result Date: 10/15/2019 CLINICAL DATA:  Shortness  of breath and pleural effusion. EXAM: PORTABLE CHEST 1 VIEW COMPARISON:  10/14/2019 FINDINGS: Left IJ venous sheath remains in placed as this is now focally kinked near its insertion site. Lungs are hypoinflated with persistent hazy perihilar opacification and left base/retrocardiac opacification with slight interval improvement. Cardiomediastinal silhouette and remainder of the exam is unchanged. IMPRESSION: Slight interval root of mild hazy patchy perihilar and left basilar opacification likely improving interstitial edema and left basilar atelectasis. Infection is possible. Left IJ central venous sheath remains in place which is now focally kinked near its insertion site over the left neck base. Recommend clinical correlation with line function. Electronically Signed   By: Elberta Fortis M.D.   On: 10/15/2019 07:35   Dg Chest Port 1 View  Result Date: 10/14/2019 CLINICAL DATA:  Short of breath.  Pleural effusion.  CABG. EXAM: PORTABLE CHEST 1 VIEW COMPARISON:  10/13/2019 FINDINGS: Swan-Ganz catheter removed. Left chest tube removed. Mediastinal drain removed. No pneumothorax Hypoventilation with decreased lung volume compared to the prior study. Increase in bibasilar atelectasis. Small bilateral effusions. Pulmonary vascular congestion. IMPRESSION: No pneumothorax post chest tube removal Decreased lung volume compared with yesterday. Increase in bibasilar atelectasis. Pulmonary vascular congestion and small pleural effusions compatible with congestive heart failure. Electronically Signed   By: Marlan Palau M.D.   On: 10/14/2019 08:08   Dg Chest Port 1 View  Result Date: 10/13/2019 CLINICAL DATA:  Chest tube placement EXAM: PORTABLE CHEST 1 VIEW COMPARISON:  October 12, 2019 FINDINGS: The Swan-Ganz catheter tip projects over the main pulmonary artery. A left-sided chest tube is in place. There is a mediastinal drain in place. No left-sided pneumothorax. The lung volumes are low. Postoperative bibasilar  atelectasis is noted. There is pulmonary edema. There is no acute osseous abnormality. The enteric tube has been removed. IMPRESSION: 1. Lines and tubes as above. 2. Otherwise, no significant oval change. Electronically Signed   By: Katherine Mantle M.D.   On: 10/13/2019 05:58   Dg Chest Port 1 View  Result Date: 10/12/2019 CLINICAL DATA:  Status post CABG. EXAM: PORTABLE CHEST 1 VIEW COMPARISON:  10/11/2019 FINDINGS: Endotracheal tube tip is above the carina. There is a mediastinal drain and left chest tube in place. Left internal jugular PA catheter is noted with tip projecting over the right ventricular outflow tract. NG tube is in place with tip just below the GE junction. Normal heart size. Mild pulmonary edema. No pleural effusion. No significant pneumothorax identified. IMPRESSION: 1. Support apparatus positioned as above. 2. Mild pulmonary edema. Electronically Signed   By: Signa Kell M.D.   On: 10/12/2019 14:21   Dg Chest Port 1 View  Result Date: 10/11/2019 CLINICAL DATA:  Pre cardiac catheterization evaluation.  EXAM: PORTABLE CHEST 1 VIEW COMPARISON:  11/15/2010 FINDINGS: Normal sized heart. Clear lungs. Stable minimal linear scarring at the left lung base. Diffuse osteopenia. IMPRESSION: No acute abnormality. Electronically Signed   By: Beckie Salts M.D.   On: 10/11/2019 14:26   Vas US Doppler Pre Cabg  Result Date: 10/12/2019 PREOPERATIVE VASCULAR EVALUATION  Indications: Pre CABG. Performing Technologist: Jeb Levering RDMS, RVT  Examination Guidelines: A complete evaluation includes B-mode imaging, spectral Doppler, color Doppler, and power Doppler as needed of all accessible portions of each vessel. Bilateral testing is considered an integral part of a complete examination. Limited examinations for reoccurring indications may be performed as noted.  Right Carotid Findings: +----------+--------+--------+--------+------------+--------+           PSV cm/sEDV cm/sStenosisDescribe     Comments +----------+--------+--------+--------+------------+--------+ CCA Prox  122     16                                   +----------+--------+--------+--------+------------+--------+ CCA Distal60      9                                    +----------+--------+--------+--------+------------+--------+ ICA Prox  55      11      1-39%   heterogenous         +----------+--------+--------+--------+------------+--------+ ICA Distal65      16                                   +----------+--------+--------+--------+------------+--------+ ECA       96      10                                   +----------+--------+--------+--------+------------+--------+ Portions of this table do not appear on this page. +----------+--------+-------+----------------+------------+           PSV cm/sEDV cmsDescribe        Arm Pressure +----------+--------+-------+----------------+------------+ Subclavian169            Multiphasic, WNL             +----------+--------+-------+----------------+------------+ +---------+--------+--+--------+--+---------+ VertebralPSV cm/s45EDV cm/s11Antegrade +---------+--------+--+--------+--+---------+ Left Carotid Findings: +----------+--------+--------+--------+------------+--------+           PSV cm/sEDV cm/sStenosisDescribe    Comments +----------+--------+--------+--------+------------+--------+ CCA Prox  59      12                                   +----------+--------+--------+--------+------------+--------+ CCA Distal92      10                                   +----------+--------+--------+--------+------------+--------+ ICA Prox  135     31      1-39%   heterogenous         +----------+--------+--------+--------+------------+--------+ ICA Distal128     29                                   +----------+--------+--------+--------+------------+--------+ ECA       209  26                                    +----------+--------+--------+--------+------------+--------+ +----------+--------+--------+----------------+------------+ SubclavianPSV cm/sEDV cm/sDescribe        Arm Pressure +----------+--------+--------+----------------+------------+           96              Multiphasic, [id number]          +----------+--------+--------+----------------+------------+ +---------+--------+--+--------+--+---------+ VertebralPSV cm/s57EDV cm/s16Antegrade +---------+--------+--+--------+--+---------+   Right Doppler Findings: +--------+--------+-----+---------+------------------------------------------+ Site    PressureIndexDoppler  Comments                                   +--------+--------+-----+---------+------------------------------------------+ Brachial             triphasicBP not taken due to recent TR band removal +--------+--------+-----+---------+------------------------------------------+ Radial               triphasic                                           +--------+--------+-----+---------+------------------------------------------+ Ulnar                triphasic                                           +--------+--------+-----+---------+------------------------------------------+  Left Doppler Findings: +--------+--------+-----+---------+--------+ Site    PressureIndexDoppler  Comments +--------+--------+-----+---------+--------+ [id number]          triphasic         +--------+--------+-----+---------+--------+ Radial               triphasic         +--------+--------+-----+---------+--------+ Ulnar                triphasic         +--------+--------+-----+---------+--------+  Summary: Right Carotid: Velocities in the right ICA are consistent with a 1-39% stenosis. Left Carotid: Velocities in the left ICA are consistent with a 1-39% stenosis. ABI: Pedal artery waveforms normal limits. Right Upper Extremity: Allen's test not done due to recent TR  band removal. Left Upper Extremity: Doppler waveforms decrease >50% with left radial compression. Doppler waveforms remain within normal limits with left ulnar compression.  Electronically signed by Fabienne Bruns MD on 10/12/2019 at 9:40:37 AM.    Final      Discharge Medications: Allergies as of 10/20/2019   No Known Allergies     Medication List    STOP taking these medications   Aleve 220 MG tablet Generic drug: naproxen sodium     TAKE these medications   aspirin 325 MG EC tablet Take 1 tablet (325 mg total) by mouth daily. What changed:   medication strength  how much to take   B-D UF III MINI PEN NEEDLES 31G X 5 MM Misc Generic drug: Insulin Pen Needle USE TO CHECK BLOOD SUGARS ONCE A DAY   clotrimazole 1 % cream Commonly known as: LOTRIMIN Apply 1 application topically as needed (Groin).   furosemide 40 MG tablet Commonly known as: LASIX Take 1 tablet (40 mg total) by mouth daily. For 4 days then stop   glimepiride 4 MG tablet Commonly known as:  AMARYL Take 4 mg by mouth 2 (two) times daily.   metFORMIN 1000 MG tablet Commonly known as: GLUCOPHAGE Take 1,000 mg by mouth 2 (two) times daily with a meal.   metoprolol tartrate 25 MG tablet Commonly known as: LOPRESSOR Take 1 tablet (25 mg total) by mouth 2 (two) times daily. What changed:   medication strength  how much to take  when to take this  additional instructions   multivitamin with minerals tablet Take 1 tablet by mouth daily.   potassium chloride SA 20 MEQ tablet Commonly known as: KLOR-CON Take 1 tablet (20 mEq total) by mouth daily. For 4 days then stop.   simvastatin 40 MG tablet Commonly known as: ZOCOR Take 40 mg by mouth daily at 6 PM.   solifenacin 5 MG tablet Commonly known as: VESICARE Take 5 mg by mouth daily.   Toujeo SoloStar 300 UNIT/ML Sopn Generic drug: Insulin Glargine (1 Unit Dial) Inject 60 mg into the skin daily.   traMADol 50 MG tablet Commonly known as:  ULTRAM Take 1 tablet (50 mg total) by mouth every 6 (six) hours as needed for moderate pain.            Durable Medical Equipment  (From admission, onward)         Start     Ordered   10/19/19 1002  For home use only DME 4 wheeled rolling walker with seat  Once    Comments: Post op  Question:  Patient needs a walker to treat with the following condition  Answer:  Weakness generalized   10/19/19 1002         The patient has been discharged on:   1.Beta Blocker:  Yes [ x  ]                              No   [   ]                              If No, reason:  2.Ace Inhibitor/ARB: Yes [   ]                                     No  [   x ]                                     If No, reason:Slightly elevated creatinine  3.Statin:   Yes [ x  ]                  No  [   ]                  If No, reason:  4.Ecasa:  Yes  [ x  ]                  No   [   ]                  If No, reason:  Follow Up Appointments: Follow-up Information    [name], Theron Arista, MD. Go on 11/17/2019.   Specialty: Cardiothoracic Surgery Why: PA/LAT CXR to be taken (at [facility] which is in the same building as  Dr. Zenaida Niece Trigt's office) on 12/09 at 10:30 am;Appointment time is at 11:00 am Contact information: [address] [place] [address] [phone]        Runell Gess, MD. Go on 11/01/2019.   Specialties: Cardiology, Radiology Why: Appointment time is at 9:15 am Contact information: [address] [place] [address] [phone]        [name], MD. Call.   Specialty: Family Medicine Why: for a follow up appointment regarding further management of diabetes and surveillance of HGA1C 9 Contact information: [address] [place] [address] [phone]        [facility] Follow up.   Why: rollator arranged- to be delivered to room prior to discharge          Signed: Lelon Huh [facility] 10/20/2019,  9:00 AM

## 2019-10-14 NOTE — Progress Notes (Addendum)
TCTS DAILY ICU PROGRESS NOTE                   [place]            [place] [address]          [phone]   2 Days Post-Op Procedure(s) (LRB): CORONARY ARTERY BYPASS GRAFTING (CABG) x 4 (LIMA to LAD, SVG to OM, SVG to RAMUS INTERMEDIATE, SVG to PDA) with EVH from RIGHT GREATER SAPHENOUS VEIN and LEFT INTERNAL MAMMARY ARTERY HARVEST (N/A) TRANSESOPHAGEAL ECHOCARDIOGRAM (TEE) (N/A)  Total Length of Stay:  LOS: 3 days   Subjective: Patient sitting in chair and states "hard to take a deep breath"  Objective: Vital signs in last 24 hours: Temp:  [98.4 F (36.9 C)-100.7 F (38.2 C)] 98.4 F (36.9 C) (11/05 0334) Pulse Rate:  [38-113] 110 (11/05 0600) Cardiac Rhythm: Normal sinus rhythm (11/05 0400) Resp:  [15-31] 18 (11/05 0600) BP: (105-142)/(51-95) 119/95 (11/05 0600) SpO2:  [91 %-97 %] 92 % (11/05 0600) Arterial Line BP: (125)/(59) 125/59 (11/04 0800) Weight:  [95.3 kg] 95.3 kg (11/05 0500)  Filed Weights   10/11/19 1259 10/13/19 0500 10/14/19 0500  Weight: 89.4 kg 96.2 kg 95.3 kg    Weight change: -0.854 kg   Hemodynamic parameters for last 24 hours: PAP: (33)/(20) 33/20 CO:  [5.3 L/min] 5.3 L/min CI:  [2.7 L/min/m2] 2.7 L/min/m2  Intake/Output from previous day: 11/04 0701 - 11/05 0700 In: 687.3 [I.V.:487.2; IV Piggyback:200.1] Out: 1740 [Urine:1530; Chest Tube:210]  Intake/Output this shift: No intake/output data recorded.  Current Meds: Scheduled Meds: . acetaminophen  1,000 mg Oral Q6H   Or  . acetaminophen (TYLENOL) oral liquid 160 mg/5 mL  1,000 mg Per Tube Q6H  . aspirin EC  325 mg Oral Daily   Or  . aspirin  324 mg Per Tube Daily  . bisacodyl  10 mg Oral Daily   Or  . bisacodyl  10 mg Rectal Daily  . Chlorhexidine Gluconate Cloth  6 each Topical Daily  . darifenacin  7.5 mg Oral Daily  . docusate sodium  200 mg Oral Daily  . furosemide  20 mg Intravenous Q6H  . insulin aspart  0-20 Units Subcutaneous Q4H  . insulin detemir   10 Units Subcutaneous Daily  . insulin detemir  8 Units Subcutaneous Daily  . metoCLOPramide (REGLAN) injection  10 mg Intravenous Q6H  . metoprolol tartrate  12.5 mg Oral BID   Or  . metoprolol tartrate  12.5 mg Per Tube BID  . pantoprazole  40 mg Oral Daily  . simvastatin  40 mg Oral q1800  . sodium chloride flush  3 mL Intravenous Q12H   Continuous Infusions: . sodium chloride    . sodium chloride    . sodium chloride    . cefUROXime (ZINACEF)  IV Stopped (10/13/19 2017)  . insulin Stopped (10/13/19 1339)  . lactated ringers    . lactated ringers 20 mL/hr at 10/14/19 0600  . nitroGLYCERIN Stopped (10/12/19 1542)  . phenylephrine (NEO-SYNEPHRINE) Adult infusion Stopped (10/12/19 2316)   PRN Meds:.sodium chloride, metoprolol tartrate, midazolam, morphine injection, ondansetron (ZOFRAN) IV, oxyCODONE, sodium chloride flush, traMADol  General appearance: cooperative and no distress Neurologic: intact Heart: RRR Lungs: Diminished bibasilar breath sounds and expiratory wheezing Abdomen: Soft, protuberant, non tender, spordic bowel sounds Extremities: Mild LE edema Wound: Aquacel intact;RLE wounds are clean and dry  Lab Results: CBC: Recent Labs    10/13/19 1554 10/14/19 0506  WBC 16.1* 15.0*  HGB 10.9* 11.5*  HCT 32.1* 34.6*  PLT 144* 140*   BMET:  Recent Labs    10/13/19 1554 10/14/19 0506  NA 135 135  K 4.1 5.3*  CL 103 102  CO2 24 25  GLUCOSE 181* 178*  BUN 14 15  CREATININE 1.20 1.39*  CALCIUM 7.9* 8.1*    CMET: Lab Results  Component Value Date   WBC 15.0 (H) 10/14/2019   HGB 11.5 (L) 10/14/2019   HCT 34.6 (L) 10/14/2019   PLT 140 (L) 10/14/2019   GLUCOSE 178 (H) 10/14/2019   CHOL  10/24/2010    117        ATP III CLASSIFICATION:  <200     mg/dL   Desirable  [id number]  mg/dL   Borderline High  >=284>=240    mg/dL   High          TRIG 92 10/24/2010   HDL 31 (L) 10/24/2010   LDLCALC  10/24/2010    68        Total Cholesterol/HDL:CHD Risk Coronary  Heart Disease Risk Table                     Men   Women  1/2 Average Risk   3.4   3.3  Average Risk       5.0   4.4  2 X Average Risk   9.6   7.1  3 X Average Risk  23.4   11.0        Use the calculated Patient Ratio above and the CHD Risk Table to determine the patient's CHD Risk.        ATP III CLASSIFICATION (LDL):  <100     mg/dL   Optimal  132-440100-129  mg/dL   Near or Above                    Optimal  130-159  mg/dL   Borderline  102-725160-189  mg/dL   High  >366>190     mg/dL   Very High   ALT 17 44/03/474211/02/2019   AST 16 10/12/2019   NA 135 10/14/2019   K 5.3 (H) 10/14/2019   CL 102 10/14/2019   CREATININE 1.39 (H) 10/14/2019   BUN 15 10/14/2019   CO2 25 10/14/2019   TSH 6.220 (H) 10/05/2019   INR 1.4 (H) 10/12/2019   HGBA1C 9.0 (H) 10/11/2019      PT/INR:  Recent Labs    10/12/19 1430  LABPROT 17.3*  INR 1.4*   Radiology: No results found.   Assessment/Plan: S/P Procedure(s) (LRB): CORONARY ARTERY BYPASS GRAFTING (CABG) x 4 (LIMA to LAD, SVG to OM, SVG to RAMUS INTERMEDIATE, SVG to PDA) with EVH from RIGHT GREATER SAPHENOUS VEIN and LEFT INTERNAL MAMMARY ARTERY HARVEST (N/A) TRANSESOPHAGEAL ECHOCARDIOGRAM (TEE) (N/A)   1. CV-EKG showed PVCs, bigeminy. On Lopressor 12.5 mg bid and will titrate as able. Milrinone drip stopped yesterday am and co ox this am 64.2.  2. Pulmonary-on 4-5 liters of oxygen via [place]. Will wean as able. CXR this am appears to show patient rotated to the right, low lung volumes, no pneumothorax, and pulmonary edema. Encourage incentive spirometer and flutter valve. Xopenex tid. 3. Expected ABL anemia-H and H this am stable at 11.5 and 34.6 4. Volume overload-On Lasix 20 mg IV QID but will increase to 40 mg IV bid 5. DM-CBGs 191/164/169. On Insulin but will increase for better glucose control. Pre op HGA1C 9. Will restart Metformin and Amaryl  closer or at discharge. He will need close medical follow up after discharge.  6. Mild thrombocytopenia-platelets  this am 140,000 7. Remove foley later today 8. Creatinine slightly increased from 1.2 to 1.39. Monitor  Donielle Margaretann Loveless PA-C 10/14/2019 7:45 AM   Patient with sinus rhythm and PVCs Cardiac output satisfactory with coox 64% off milrinone Patient with wheezing due to volume retention and upper airway edema-Lasix and 1 dose of Solu-Cortef ordered Continue ICU care until pulmonary status improves       Exam    General- alert and comfortable    Neck- no JVD, no cervical adenopathy palpable, no carotid bruit   Lungs- clear without rales, wheezes   Cor- regular rate and rhythm, no murmur , gallop   Abdomen- soft, non-tender   Extremities - warm, non-tender, minimal edema   Neuro- oriented, appropriate, no focal weakness

## 2019-10-14 NOTE — Discharge Instructions (Signed)

## 2019-10-14 NOTE — Progress Notes (Signed)
EVENING ROUNDS NOTE :     [place]       [place], [address]             [phone]                 2 Days Post-Op Procedure(s) (LRB): CORONARY ARTERY BYPASS GRAFTING (CABG) x 4 (LIMA to LAD, SVG to OM, SVG to RAMUS INTERMEDIATE, SVG to PDA) with EVH from RIGHT GREATER SAPHENOUS VEIN and LEFT INTERNAL MAMMARY ARTERY HARVEST (N/A) TRANSESOPHAGEAL ECHOCARDIOGRAM (TEE) (N/A)   Total Length of Stay:  LOS: 3 days  Events:  Looks comfortable  satting well on 4 L -1L so far today     BP 114/65   Pulse (!) 113   Temp 99.7 F (37.6 C) (Oral)   Resp (!) 22   Ht 5\' 7"  (1.702 m)   Wt 95.3 kg   SpO2 92%   BMI 32.92 kg/m         . sodium chloride    . sodium chloride    . sodium chloride    . amiodarone 60 mg/hr (10/14/19 1500)  . amiodarone    . lactated ringers      I/O last 3 completed shifts: In: 1362.5 [I.V.:824; IV Piggyback:538.5] Out: 8182 [Urine:2050; Chest Tube:500]   CBC Latest Ref Rng & Units 10/14/2019 10/13/2019 10/13/2019  WBC 4.0 - 10.5 K/uL 15.0(H) 16.1(H) 15.8(H)  Hemoglobin 13.0 - 17.0 g/dL 11.5(L) 10.9(L) 11.2(L)  Hematocrit 39.0 - 52.0 % 34.6(L) 32.1(L) 33.6(L)  Platelets 150 - 400 K/uL 140(L) 144(L) 176    BMP Latest Ref Rng & Units 10/14/2019 10/13/2019 10/13/2019  Glucose 70 - 99 mg/dL 178(H) 181(H) 121(H)  BUN 8 - 23 mg/dL 15 14 11   Creatinine 0.61 - 1.24 mg/dL 1.39(H) 1.20 1.06  BUN/Creat Ratio 10 - 24 - - -  Sodium 135 - 145 mmol/L 135 135 138  Potassium 3.5 - 5.1 mmol/L 5.3(H) 4.1 4.4  Chloride 98 - 111 mmol/L 102 103 106  CO2 22 - 32 mmol/L 25 24 21(L)  Calcium 8.9 - 10.3 mg/dL 8.1(L) 7.9(L) 8.1(L)    ABG    Component Value Date/Time   PHART 7.450 10/13/2019 0314   PCO2ART 32.5 10/13/2019 0314   PO2ART 70.0 (L) 10/13/2019 0314   HCO3 22.5 10/13/2019 0314   TCO2 24 10/13/2019 0314   ACIDBASEDEF 1.0 10/13/2019 0314   O2SAT 64.2 10/14/2019 0650       Melodie Bouillon, MD 10/14/2019 4:31 PM

## 2019-10-14 NOTE — Progress Notes (Addendum)
Inpatient Diabetes Program Recommendations  AACE/ADA: New Consensus Statement on Inpatient Glycemic Control (2015)  Target Ranges:  Prepandial:   less than 140 mg/dL      Peak postprandial:   less than 180 mg/dL (1-2 hours)      Critically ill patients:  140 - 180 mg/dL   Lab Results  Component Value Date   GLUCAP 240 (H) 10/14/2019   HGBA1C 9.0 (H) 10/11/2019    Review of Glycemic Control Results for Fred [name], Fred [name] (MRN [id number]) as of 10/14/2019 14:01  Ref. Range 10/14/2019 03:33 10/14/2019 07:48 10/14/2019 12:24  Glucose-Capillary Latest Ref Range: 70 - 99 mg/dL 169 (H) 155 (H) 240 (H)   Diabetes history: Type 2 DM Outpatient Diabetes medications: Amaryl 4 mg BID, Metformin 1000 mg BID, Toujeo 60 units QD Current orders for Inpatient glycemic control: Novolog 0-20 units Q4H, Levemir 12 units QD Solucortef 100 mg x 1  Inpatient Diabetes Program Recommendations:    Consider increasing Lantus to 15 units QD and changing correction to Novolog 0-20 units TID.   Thanks, Bronson Curb, MSN, RNC-OB Diabetes Coordinator [phone] (8a-5p)

## 2019-10-15 ENCOUNTER — Inpatient Hospital Stay (HOSPITAL_COMMUNITY): Payer: Medicare Other

## 2019-10-15 LAB — GLUCOSE, CAPILLARY
Glucose-Capillary: 136 mg/dL — ABNORMAL HIGH (ref 70–99)
Glucose-Capillary: 164 mg/dL — ABNORMAL HIGH (ref 70–99)
Glucose-Capillary: 172 mg/dL — ABNORMAL HIGH (ref 70–99)
Glucose-Capillary: 186 mg/dL — ABNORMAL HIGH (ref 70–99)
Glucose-Capillary: 189 mg/dL — ABNORMAL HIGH (ref 70–99)
Glucose-Capillary: 197 mg/dL — ABNORMAL HIGH (ref 70–99)
Glucose-Capillary: 215 mg/dL — ABNORMAL HIGH (ref 70–99)

## 2019-10-15 LAB — BASIC METABOLIC PANEL
Anion gap: 9 (ref 5–15)
BUN: 20 mg/dL (ref 8–23)
CO2: 28 mmol/L (ref 22–32)
Calcium: 8.1 mg/dL — ABNORMAL LOW (ref 8.9–10.3)
Chloride: 98 mmol/L (ref 98–111)
Creatinine, Ser: 1.28 mg/dL — ABNORMAL HIGH (ref 0.61–1.24)
GFR calc Af Amer: >60 mL/min (ref >60)
GFR calc non Af Amer: 53 mL/min — ABNORMAL LOW (ref >60)
Glucose, Bld: 125 mg/dL — ABNORMAL HIGH (ref 70–99)
Potassium: 3.6 mmol/L (ref 3.5–5.1)
Sodium: 135 mmol/L (ref 135–145)

## 2019-10-15 LAB — CBC
HCT: 34.6 % — ABNORMAL LOW (ref 39.0–52.0)
Hemoglobin: 11.2 g/dL — ABNORMAL LOW (ref 13.0–17.0)
MCH: 31.3 pg (ref 26.0–34.0)
MCHC: 32.4 g/dL (ref 30.0–36.0)
MCV: 96.6 fL (ref 80.0–100.0)
Platelets: 177 10*3/uL (ref 150–400)
RBC: 3.58 MIL/uL — ABNORMAL LOW (ref 4.22–5.81)
RDW: 13.3 % (ref 11.5–15.5)
WBC: 14.7 10*3/uL — ABNORMAL HIGH (ref 4.0–10.5)
nRBC: 0 % (ref 0.0–0.2)

## 2019-10-15 MED ORDER — METOPROLOL TARTRATE 25 MG PO TABS
25.0000 mg | ORAL_TABLET | Freq: Two times a day (BID) | ORAL | Status: DC
  Administered 2019-10-15 – 2019-10-20 (×11): 25 mg via ORAL
  Filled 2019-10-15 (×11): qty 1

## 2019-10-15 MED ORDER — INSULIN DETEMIR 100 UNIT/ML ~~LOC~~ SOLN
15.0000 [IU] | Freq: Every day | SUBCUTANEOUS | Status: DC
  Administered 2019-10-15 – 2019-10-17 (×3): 15 [IU] via SUBCUTANEOUS
  Filled 2019-10-15 (×3): qty 0.15

## 2019-10-15 MED ORDER — METOPROLOL TARTRATE 25 MG/10 ML ORAL SUSPENSION
25.0000 mg | Freq: Two times a day (BID) | ORAL | Status: DC
  Filled 2019-10-15 (×7): qty 10

## 2019-10-15 MED ORDER — METOPROLOL TARTRATE 5 MG/5ML IV SOLN
INTRAVENOUS | Status: AC
  Administered 2019-10-15: 2.5 mg via INTRAVENOUS
  Filled 2019-10-15: qty 5

## 2019-10-15 MED ORDER — METOLAZONE 5 MG PO TABS
5.0000 mg | ORAL_TABLET | Freq: Once | ORAL | Status: AC
  Administered 2019-10-15: 5 mg via ORAL
  Filled 2019-10-15: qty 1

## 2019-10-15 MED ORDER — SORBITOL 70 % PO SOLN
30.0000 mL | Freq: Once | ORAL | Status: AC
  Administered 2019-10-15: 30 mL via ORAL
  Filled 2019-10-15: qty 30

## 2019-10-15 MED ORDER — METOPROLOL TARTRATE 5 MG/5ML IV SOLN
2.5000 mg | Freq: Once | INTRAVENOUS | Status: AC
  Administered 2019-10-15: 10:00:00 2.5 mg via INTRAVENOUS

## 2019-10-15 MED ORDER — AMIODARONE HCL 200 MG PO TABS
200.0000 mg | ORAL_TABLET | Freq: Two times a day (BID) | ORAL | Status: DC
  Filled 2019-10-15: qty 1

## 2019-10-15 MED ORDER — AMIODARONE HCL 200 MG PO TABS
400.0000 mg | ORAL_TABLET | Freq: Two times a day (BID) | ORAL | Status: DC
  Administered 2019-10-15 – 2019-10-18 (×7): 400 mg via ORAL
  Filled 2019-10-15 (×7): qty 2

## 2019-10-15 MED ORDER — METOLAZONE 5 MG PO TABS: 5.0000 mg | ORAL_TABLET | Freq: Once | ORAL | Status: AC

## 2019-10-15 MED FILL — Sodium Chloride IV Soln 0.9%: INTRAVENOUS | Qty: 3000 | Status: AC

## 2019-10-15 MED FILL — Potassium Chloride Inj 2 mEq/ML: INTRAVENOUS | Qty: 40 | Status: AC

## 2019-10-15 MED FILL — Heparin Sodium (Porcine) Inj 1000 Unit/ML: INTRAMUSCULAR | Qty: 30 | Status: AC

## 2019-10-15 MED FILL — Heparin Sodium (Porcine) Inj 1000 Unit/ML: INTRAMUSCULAR | Qty: 10 | Status: AC

## 2019-10-15 MED FILL — Calcium Chloride Inj 10%: INTRAVENOUS | Qty: 10 | Status: AC

## 2019-10-15 MED FILL — Lidocaine HCl Local Soln Prefilled Syringe 100 MG/5ML (2%): INTRAMUSCULAR | Qty: 5 | Status: AC

## 2019-10-15 MED FILL — Electrolyte-R (PH 7.4) Solution: INTRAVENOUS | Qty: 6000 | Status: AC

## 2019-10-15 MED FILL — Sodium Bicarbonate IV Soln 8.4%: INTRAVENOUS | Qty: 50 | Status: AC

## 2019-10-15 MED FILL — Mannitol IV Soln 20%: INTRAVENOUS | Qty: 500 | Status: AC

## 2019-10-15 MED FILL — Magnesium Sulfate Inj 50%: INTRAMUSCULAR | Qty: 10 | Status: AC

## 2019-10-15 NOTE — Progress Notes (Signed)
      [address]       [place] [address]             [phone]      POD # 3   Resting comfortably  BP 111/71   Pulse 84   Temp 99.4 F (37.4 C) (Oral)   Resp 14   Ht 5\' 7"  (1.702 m)   Wt 94.1 kg   SpO2 96%   BMI 32.49 kg/m   Intake/Output Summary (Last 24 hours) at 10/15/2019 1638 Last data filed at 10/15/2019 1400 Gross per 24 hour  Intake 360.11 ml  Output 1880 ml  Net -1519.89 ml   CBG 164-197  Continue current Rx  Finnick Orosz C. Roxan Hockey, MD [facility] [phone]

## 2019-10-15 NOTE — Progress Notes (Addendum)
TCTS DAILY ICU PROGRESS NOTE                   [address]            [place] [address]          [phone]   3 Days Post-Op Procedure(s) (LRB): CORONARY ARTERY BYPASS GRAFTING (CABG) x 4 (LIMA to LAD, SVG to OM, SVG to RAMUS INTERMEDIATE, SVG to PDA) with EVH from RIGHT GREATER SAPHENOUS VEIN and LEFT INTERNAL MAMMARY ARTERY HARVEST (N/A) TRANSESOPHAGEAL ECHOCARDIOGRAM (TEE) (N/A)  Total Length of Stay:  LOS: 4 days   Subjective: Patient already took a "small walk". He is sleepy this am as he did not sleep well last evening.  Objective: Vital signs in last 24 hours: Temp:  [97.8 F (36.6 C)-99.7 F (37.6 C)] 97.8 F (36.6 C) (11/06 0356) Pulse Rate:  [45-118] 105 (11/06 0700) Cardiac Rhythm: Normal sinus rhythm (11/05 2000) Resp:  [11-38] 17 (11/06 0700) BP: (105-149)/(54-96) 141/80 (11/06 0700) SpO2:  [89 %-99 %] 93 % (11/06 0724) Weight:  [94.1 kg] 94.1 kg (11/06 0600)  Filed Weights   10/13/19 0500 10/14/19 0500 10/15/19 0600  Weight: 96.2 kg 95.3 kg 94.1 kg    Weight change: -1.246 kg      Intake/Output from previous day: 11/05 0701 - 11/06 0700 In: 1386.8 [I.V.:436.8] Out: 1030 [Urine:1030]  Intake/Output this shift: No intake/output data recorded.  Current Meds: Scheduled Meds: . acetaminophen  1,000 mg Oral Q6H   Or  . acetaminophen (TYLENOL) oral liquid 160 mg/5 mL  1,000 mg Per Tube Q6H  . aspirin EC  325 mg Oral Daily   Or  . aspirin  324 mg Per Tube Daily  . bisacodyl  10 mg Oral Daily   Or  . bisacodyl  10 mg Rectal Daily  . Chlorhexidine Gluconate Cloth  6 each Topical Daily  . darifenacin  7.5 mg Oral Daily  . docusate sodium  200 mg Oral Daily  . enoxaparin (LOVENOX) injection  40 mg Subcutaneous Q24H  . furosemide  40 mg Intravenous BID  . insulin aspart  0-20 Units Subcutaneous Q4H  . insulin detemir  12 Units Subcutaneous Daily  . levalbuterol  0.63 mg Nebulization TID  . metoCLOPramide (REGLAN) injection  10 mg  Intravenous Q6H  . metolazone  5 mg Oral Once  . metoprolol tartrate  12.5 mg Oral BID   Or  . metoprolol tartrate  12.5 mg Per Tube BID  . pantoprazole  40 mg Oral Daily  . simvastatin  40 mg Oral q1800  . sodium chloride flush  3 mL Intravenous Q12H   Continuous Infusions: . sodium chloride    . sodium chloride    . sodium chloride    . lactated ringers     PRN Meds:.sodium chloride, morphine injection, ondansetron (ZOFRAN) IV, oxyCODONE, sodium chloride flush, traMADol  General appearance: cooperative and no distress Neurologic: intact Heart: Slightly tacycardic Lungs: Diminished bibasilar breath sounds Abdomen: Soft, protuberant, non tender, spordic bowel sounds Extremities: Mild LE edema Wound: Sternal wound with minor sero sanguinous drainage lower wound;RLE wounds are clean and dry. No sign of infection in either wound  Lab Results: CBC: Recent Labs    10/14/19 0506 10/15/19 0533  WBC 15.0* 14.7*  HGB 11.5* 11.2*  HCT 34.6* 34.6*  PLT 140* 177   BMET:  Recent Labs    10/14/19 0506 10/15/19 0533  NA 135 135  K 5.3* 3.6  CL 102  98  CO2 25 28  GLUCOSE 178* 125*  BUN 15 20  CREATININE 1.39* 1.28*  CALCIUM 8.1* 8.1*    CMET: Lab Results  Component Value Date   WBC 14.7 (H) 10/15/2019   HGB 11.2 (L) 10/15/2019   HCT 34.6 (L) 10/15/2019   PLT 177 10/15/2019   GLUCOSE 125 (H) 10/15/2019   CHOL  10/24/2010    117        ATP III CLASSIFICATION:  <200     mg/dL   Desirable  782-956200-239  mg/dL   Borderline High  >=213>=240    mg/dL   High          TRIG 92 10/24/2010   HDL 31 (L) 10/24/2010   LDLCALC  10/24/2010    68        Total Cholesterol/HDL:CHD Risk Coronary Heart Disease Risk Table                     Men   Women  1/2 Average Risk   3.4   3.3  Average Risk       5.0   4.4  2 X Average Risk   9.6   7.1  3 X Average Risk  23.4   11.0        Use the calculated Patient Ratio above and the CHD Risk Table to determine the patient's CHD Risk.         ATP III CLASSIFICATION (LDL):  <100     mg/dL   Optimal  086-578100-129  mg/dL   Near or Above                    Optimal  130-159  mg/dL   Borderline  469-629160-189  mg/dL   High  >528>190     mg/dL   Very High   ALT 17 41/32/440111/02/2019   AST 16 10/12/2019   NA 135 10/15/2019   K 3.6 10/15/2019   CL 98 10/15/2019   CREATININE 1.28 (H) 10/15/2019   BUN 20 10/15/2019   CO2 28 10/15/2019   TSH 6.220 (H) 10/05/2019   INR 1.4 (H) 10/12/2019   HGBA1C 9.0 (H) 10/11/2019      PT/INR:  Recent Labs    10/12/19 1430  LABPROT 17.3*  INR 1.4*   Radiology: Dg Chest Port 1 View  Result Date: 10/15/2019 CLINICAL DATA:  Shortness of breath and pleural effusion. EXAM: PORTABLE CHEST 1 VIEW COMPARISON:  10/14/2019 FINDINGS: Left IJ venous sheath remains in placed as this is now focally kinked near its insertion site. Lungs are hypoinflated with persistent hazy perihilar opacification and left base/retrocardiac opacification with slight interval improvement. Cardiomediastinal silhouette and remainder of the exam is unchanged. IMPRESSION: Slight interval root of mild hazy patchy perihilar and left basilar opacification likely improving interstitial edema and left basilar atelectasis. Infection is possible. Left IJ central venous sheath remains in place which is now focally kinked near its insertion site over the left neck base. Recommend clinical correlation with line function. Electronically Signed   By: Elberta Fortisaniel  Boyle M.D.   On: 10/15/2019 07:35     Assessment/Plan: S/P Procedure(s) (LRB): CORONARY ARTERY BYPASS GRAFTING (CABG) x 4 (LIMA to LAD, SVG to OM, SVG to RAMUS INTERMEDIATE, SVG to PDA) with EVH from RIGHT GREATER SAPHENOUS VEIN and LEFT INTERNAL MAMMARY ARTERY HARVEST (N/A) TRANSESOPHAGEAL ECHOCARDIOGRAM (TEE) (N/A)   1. CV-ST with HR in the low 100's this am. On Amiodarone drip and Lopressor 12.5 mg bid. Will  increase Lopressor to 25 mg bid. 2. Pulmonary-on 4-6 liters of oxygen via [place].  Wean as able. CXR  this am appears to show left basilar opacification (edema, atelectasis,), no pneumothorax, and improving pulmonary edema. Encourage incentive spirometer and flutter valve. Xopenex tid. 3. Expected ABL anemia-H and H this am stable at 11.2 and 34.6 4. Volume overload-Given Lasix 40 mg IV bid yesterday. Will give this along with Zaroxolyn this am. 5. DM-CBGs 268/189/136. On Insulin but will increase for better glucose control. Pre op HGA1C 9. Will restart Metformin and Amaryl closer or at discharge. He will need close medical follow up after discharge.  6. Mild thrombocytopenia resolved -platelets this am up to  177,000 7. Supplement potassium 8. Creatinine slightly decreased from 1.39 to 1.28 9. Patient to remain in ICU today  Donielle Liston Alba PA-C 10/15/2019 7:41 AM   Ventricular ectopy improved, blood pressure increasing Chest x-ray with improved edema Patient remains on 4-6 L of oxygen-we will leave in ICU 1 more day for pulmonary monitoring. DC Foley and central line today.  patient examined and medical record reviewed,agree with above note. Tharon Aquas Trigt III 10/15/2019

## 2019-10-16 ENCOUNTER — Inpatient Hospital Stay (HOSPITAL_COMMUNITY): Payer: Medicare Other

## 2019-10-16 ENCOUNTER — Other Ambulatory Visit: Payer: Self-pay

## 2019-10-16 LAB — CBC
HCT: 31.7 % — ABNORMAL LOW (ref 39.0–52.0)
Hemoglobin: 10.5 g/dL — ABNORMAL LOW (ref 13.0–17.0)
MCH: 31.8 pg (ref 26.0–34.0)
MCHC: 33.1 g/dL (ref 30.0–36.0)
MCV: 96.1 fL (ref 80.0–100.0)
Platelets: 219 10*3/uL (ref 150–400)
RBC: 3.3 MIL/uL — ABNORMAL LOW (ref 4.22–5.81)
RDW: 13.3 % (ref 11.5–15.5)
WBC: 11.4 10*3/uL — ABNORMAL HIGH (ref 4.0–10.5)
nRBC: 0 % (ref 0.0–0.2)

## 2019-10-16 LAB — BASIC METABOLIC PANEL
Anion gap: 12 (ref 5–15)
BUN: 27 mg/dL — ABNORMAL HIGH (ref 8–23)
CO2: 27 mmol/L (ref 22–32)
Calcium: 8 mg/dL — ABNORMAL LOW (ref 8.9–10.3)
Chloride: 96 mmol/L — ABNORMAL LOW (ref 98–111)
Creatinine, Ser: 1.47 mg/dL — ABNORMAL HIGH (ref 0.61–1.24)
GFR calc Af Amer: 52 mL/min — ABNORMAL LOW (ref >60)
GFR calc non Af Amer: 45 mL/min — ABNORMAL LOW (ref >60)
Glucose, Bld: 164 mg/dL — ABNORMAL HIGH (ref 70–99)
Potassium: 3.6 mmol/L (ref 3.5–5.1)
Sodium: 135 mmol/L (ref 135–145)

## 2019-10-16 LAB — GLUCOSE, CAPILLARY
Glucose-Capillary: 150 mg/dL — ABNORMAL HIGH (ref 70–99)
Glucose-Capillary: 124 mg/dL — ABNORMAL HIGH (ref 70–99)
Glucose-Capillary: 200 mg/dL — ABNORMAL HIGH (ref 70–99)
Glucose-Capillary: 188 mg/dL — ABNORMAL HIGH (ref 70–99)
Glucose-Capillary: 153 mg/dL — ABNORMAL HIGH (ref 70–99)

## 2019-10-16 MED ORDER — INSULIN ASPART 100 UNIT/ML ~~LOC~~ SOLN
0.0000 [IU] | Freq: Three times a day (TID) | SUBCUTANEOUS | Status: DC
  Administered 2019-10-16 – 2019-10-17 (×4): 3 [IU] via SUBCUTANEOUS
  Administered 2019-10-17: 8 [IU] via SUBCUTANEOUS
  Administered 2019-10-18: 5 [IU] via SUBCUTANEOUS
  Administered 2019-10-18 – 2019-10-19 (×2): 3 [IU] via SUBCUTANEOUS
  Administered 2019-10-19: 2 [IU] via SUBCUTANEOUS
  Administered 2019-10-19: 5 [IU] via SUBCUTANEOUS
  Administered 2019-10-20: 3 [IU] via SUBCUTANEOUS

## 2019-10-16 MED ORDER — POTASSIUM CHLORIDE CRYS ER 20 MEQ PO TBCR
20.0000 meq | EXTENDED_RELEASE_TABLET | ORAL | Status: AC
  Administered 2019-10-16 (×3): 20 meq via ORAL
  Filled 2019-10-16 (×3): qty 1

## 2019-10-16 MED ORDER — INSULIN ASPART 100 UNIT/ML ~~LOC~~ SOLN
0.0000 [IU] | Freq: Every day | SUBCUTANEOUS | Status: DC
  Administered 2019-10-17: 2 [IU] via SUBCUTANEOUS

## 2019-10-16 MED ORDER — LACTULOSE 10 GM/15ML PO SOLN
20.0000 g | Freq: Once | ORAL | Status: AC
  Administered 2019-10-16: 20 g via ORAL
  Filled 2019-10-16: qty 30

## 2019-10-16 MED ORDER — FUROSEMIDE 10 MG/ML IJ SOLN
40.0000 mg | Freq: Every day | INTRAMUSCULAR | Status: DC
  Administered 2019-10-17: 40 mg via INTRAVENOUS
  Filled 2019-10-16: qty 4

## 2019-10-16 NOTE — Progress Notes (Signed)
Pt has a coughing episode while taking his PO medications and his heart rhythm changed from NSR 80s to AFIV RVR with rate of 140s to 170s. Pt complained of "not feeling well" and reported feeling anxious. While this RN was setting up EKG machine, pt converted back to NSR, 80s. AFIB lasted for about 8 minutes. Pt is now resting in bed with eyes closed, denies feeling anxious, is alert to voice, and oriented. Pt denies having chest pain at this time. Call bell is within reach and bed is in lowest position.

## 2019-10-16 NOTE — Progress Notes (Signed)
      [address]       [place] [address]             [phone]      Up in chair Feels better this afternoon No BM yet but passed a lot of flatus  Remo Lipps C. Roxan Hockey, MD [facility] [phone]

## 2019-10-16 NOTE — Progress Notes (Signed)
4 Days Post-Op Procedure(s) (LRB): CORONARY ARTERY BYPASS GRAFTING (CABG) x 4 (LIMA to LAD, SVG to OM, SVG to RAMUS INTERMEDIATE, SVG to PDA) with EVH from RIGHT GREATER SAPHENOUS VEIN and LEFT INTERNAL MAMMARY ARTERY HARVEST (N/A) TRANSESOPHAGEAL ECHOCARDIOGRAM (TEE) (N/A) Subjective: C/o abdominal distention, trouble getting a deep breath  Objective: Vital signs in last 24 hours: Temp:  [97.8 F (36.6 C)-99.4 F (37.4 C)] 98.9 F (37.2 C) (11/07 0808) Pulse Rate:  [73-94] 86 (11/07 0700) Cardiac Rhythm: Normal sinus rhythm (11/07 0400) Resp:  [12-19] 14 (11/07 0700) BP: (78-126)/(56-85) 124/72 (11/07 0700) SpO2:  [91 %-98 %] 93 % (11/07 0700) Weight:  [92.5 kg] 92.5 kg (11/07 0500)  Hemodynamic parameters for last 24 hours:    Intake/Output from previous day: 11/06 0701 - 11/07 0700 In: 36.4 [I.V.:36.4] Out: 1300 [Urine:1300] Intake/Output this shift: No intake/output data recorded.  General appearance: alert, cooperative and no distress Neurologic: intact Heart: regular rate and rhythm Lungs: diminished breath sounds bibasilar Abdomen: distended, tympanitic, but + BS  Lab Results: Recent Labs    10/15/19 0533 10/16/19 0234  WBC 14.7* 11.4*  HGB 11.2* 10.5*  HCT 34.6* 31.7*  PLT 177 219   BMET:  Recent Labs    10/15/19 0533 10/16/19 0234  NA 135 135  K 3.6 3.6  CL 98 96*  CO2 28 27  GLUCOSE 125* 164*  BUN 20 27*  CREATININE 1.28* 1.47*  CALCIUM 8.1* 8.0*    PT/INR: No results for input(s): LABPROT, INR in the last 72 hours. ABG    Component Value Date/Time   PHART 7.450 10/13/2019 0314   HCO3 22.5 10/13/2019 0314   TCO2 24 10/13/2019 0314   ACIDBASEDEF 1.0 10/13/2019 0314   O2SAT 64.2 10/14/2019 0650   CBG (last 3)  Recent Labs    10/15/19 2341 10/16/19 0340 10/16/19 0806  GLUCAP 186* 150* 124*    Assessment/Plan: S/P Procedure(s) (LRB): CORONARY ARTERY BYPASS GRAFTING (CABG) x 4 (LIMA to LAD, SVG to OM, SVG to RAMUS INTERMEDIATE, SVG  to PDA) with EVH from [place] and LEFT INTERNAL MAMMARY ARTERY HARVEST (N/A) TRANSESOPHAGEAL ECHOCARDIOGRAM (TEE) (N/A) -CV- stable RESP- still has bibasilar atelectasis- continue current care RENAL- creatinine up from 1.28 to 1.47, will decrease lasix to 40 mg daily  Supplement K ENDO_ CBG reasonable well controlled GI- very distended but does have BS  Continue Reglan, lactulose this AM Ambulate   LOS: 5 days    Melrose Nakayama 10/16/2019

## 2019-10-17 ENCOUNTER — Inpatient Hospital Stay (HOSPITAL_COMMUNITY): Payer: Medicare Other

## 2019-10-17 LAB — CBC
HCT: 32.6 % — ABNORMAL LOW (ref 39.0–52.0)
Hemoglobin: 10.9 g/dL — ABNORMAL LOW (ref 13.0–17.0)
MCH: 31.8 pg (ref 26.0–34.0)
MCHC: 33.4 g/dL (ref 30.0–36.0)
MCV: 95 fL (ref 80.0–100.0)
Platelets: 261 10*3/uL (ref 150–400)
RBC: 3.43 MIL/uL — ABNORMAL LOW (ref 4.22–5.81)
RDW: 13.2 % (ref 11.5–15.5)
WBC: 8.5 10*3/uL (ref 4.0–10.5)
nRBC: 0 % (ref 0.0–0.2)

## 2019-10-17 LAB — BASIC METABOLIC PANEL
Anion gap: 12 (ref 5–15)
BUN: 26 mg/dL — ABNORMAL HIGH (ref 8–23)
CO2: 27 mmol/L (ref 22–32)
Calcium: 8 mg/dL — ABNORMAL LOW (ref 8.9–10.3)
Chloride: 96 mmol/L — ABNORMAL LOW (ref 98–111)
Creatinine, Ser: 1.34 mg/dL — ABNORMAL HIGH (ref 0.61–1.24)
GFR calc Af Amer: 58 mL/min — ABNORMAL LOW (ref >60)
GFR calc non Af Amer: 50 mL/min — ABNORMAL LOW (ref >60)
Glucose, Bld: 172 mg/dL — ABNORMAL HIGH (ref 70–99)
Potassium: 4 mmol/L (ref 3.5–5.1)
Sodium: 135 mmol/L (ref 135–145)

## 2019-10-17 LAB — GLUCOSE, CAPILLARY
Glucose-Capillary: 196 mg/dL — ABNORMAL HIGH (ref 70–99)
Glucose-Capillary: 265 mg/dL — ABNORMAL HIGH (ref 70–99)
Glucose-Capillary: 209 mg/dL — ABNORMAL HIGH (ref 70–99)

## 2019-10-17 MED ORDER — SODIUM CHLORIDE 0.9% FLUSH
3.0000 mL | Freq: Two times a day (BID) | INTRAVENOUS | Status: DC
  Administered 2019-10-17 – 2019-10-20 (×6): 3 mL via INTRAVENOUS

## 2019-10-17 MED ORDER — MAGNESIUM HYDROXIDE 400 MG/5ML PO SUSP: 30.0000 mL | Freq: Every day | ORAL | Status: DC | PRN

## 2019-10-17 MED ORDER — METFORMIN HCL 500 MG PO TABS
1000.0000 mg | ORAL_TABLET | Freq: Two times a day (BID) | ORAL | Status: DC
  Administered 2019-10-17 – 2019-10-20 (×6): 1000 mg via ORAL
  Filled 2019-10-17 (×6): qty 2

## 2019-10-17 MED ORDER — LEVALBUTEROL HCL 0.63 MG/3ML IN NEBU: 0.6300 mg | INHALATION_SOLUTION | Freq: Four times a day (QID) | RESPIRATORY_TRACT | Status: DC | PRN

## 2019-10-17 MED ORDER — METOCLOPRAMIDE HCL 5 MG/ML IJ SOLN
10.0000 mg | Freq: Four times a day (QID) | INTRAMUSCULAR | Status: AC
  Administered 2019-10-17 – 2019-10-18 (×4): 10 mg via INTRAVENOUS
  Filled 2019-10-17 (×4): qty 2

## 2019-10-17 MED ORDER — INSULIN DETEMIR 100 UNIT/ML ~~LOC~~ SOLN
25.0000 [IU] | Freq: Every day | SUBCUTANEOUS | Status: DC
  Filled 2019-10-17: qty 0.25

## 2019-10-17 MED ORDER — ~~LOC~~ CARDIAC SURGERY, PATIENT & FAMILY EDUCATION
Freq: Once | Status: AC
  Administered 2019-10-17: 13:00:00

## 2019-10-17 MED ORDER — SODIUM CHLORIDE 0.9% FLUSH: 3.0000 mL | INTRAVENOUS | Status: DC | PRN

## 2019-10-17 MED ORDER — SODIUM CHLORIDE 0.9 % IV SOLN: 250.0000 mL | INTRAVENOUS | Status: DC | PRN

## 2019-10-17 MED ORDER — GLIMEPIRIDE 4 MG PO TABS
4.0000 mg | ORAL_TABLET | Freq: Every day | ORAL | Status: DC
  Administered 2019-10-18 – 2019-10-20 (×4): 4 mg via ORAL
  Filled 2019-10-17 (×3): qty 1

## 2019-10-17 MED ORDER — LACTULOSE 10 GM/15ML PO SOLN
20.0000 g | Freq: Once | ORAL | Status: AC
  Administered 2019-10-17: 20 g via ORAL
  Filled 2019-10-17: qty 30

## 2019-10-17 MED ORDER — GLIMEPIRIDE 4 MG PO TABS: 4.0000 mg | ORAL_TABLET | Freq: Two times a day (BID) | ORAL | Status: DC

## 2019-10-17 NOTE — Plan of Care (Signed)
Pt is alert and oriented, on 1L [place] overnight  but has now been weaned to room air while ambulating in hallway. Pt verbalizes understanding of medications and interventions. Pt did develop an episode of AFIB with RVR earlier while taking medications. Pt has a difficult time swallowing PO. Pt reports he had some problems with taking medications before surgery as well. Pt does not complain of shortness of breath, No signs of shortness or breath requiored.  Problem: Education: Goal: Knowledge of General Education information will improve Description: Including pain rating scale, medication(s)/side effects and non-pharmacologic comfort measures Outcome: Progressing   Problem: Health Behavior/Discharge Planning: Goal: Ability to manage health-related needs will improve Outcome: Progressing   Problem: Clinical Measurements: Goal: Ability to maintain clinical measurements within normal limits will improve Outcome: Progressing Goal: Respiratory complications will improve Outcome: Progressing Goal: Cardiovascular complication will be avoided Outcome: Progressing   Problem: Activity: Goal: Risk for activity intolerance will decrease Outcome: Progressing

## 2019-10-17 NOTE — Progress Notes (Signed)
5 Days Post-Op Procedure(s) (LRB): CORONARY ARTERY BYPASS GRAFTING (CABG) x 4 (LIMA to LAD, SVG to OM, SVG to RAMUS INTERMEDIATE, SVG to PDA) with EVH from [place] and LEFT INTERNAL MAMMARY ARTERY HARVEST (N/A) TRANSESOPHAGEAL ECHOCARDIOGRAM (TEE) (N/A) Subjective: Feels better, appetite better this AM  Objective: Vital signs in last 24 hours: Temp:  [98.3 F (36.8 C)-99 F (37.2 C)] 98.8 F (37.1 C) (11/08 0740) Pulse Rate:  [76-102] 96 (11/08 0800) Cardiac Rhythm: Normal sinus rhythm (11/08 0800) Resp:  [10-20] 17 (11/08 0800) BP: (103-150)/(59-97) 136/65 (11/08 0800) SpO2:  [91 %-98 %] 98 % (11/08 0800) FiO2 (%):  [28 %] 28 % (11/08 0752) Weight:  [90.4 kg] 90.4 kg (11/08 0500)  Hemodynamic parameters for last 24 hours:    Intake/Output from previous day: 11/07 0701 - 11/08 0700 In: 840 [P.O.:840] Out: 1975 [Urine:1975] Intake/Output this shift: Total I/O In: 250 [P.O.:250] Out: 250 [Urine:250]  General appearance: alert, cooperative and no distress Neurologic: intact Heart: regular rate and rhythm Lungs: clear to auscultation bilaterally Abdomen: still distended, but less so this AM, less tympanitic  Lab Results: Recent Labs    10/16/19 0234 10/17/19 0226  WBC 11.4* 8.5  HGB 10.5* 10.9*  HCT 31.7* 32.6*  PLT 219 261   BMET:  Recent Labs    10/16/19 0234 10/17/19 0226  NA 135 135  K 3.6 4.0  CL 96* 96*  CO2 27 27  GLUCOSE 164* 172*  BUN 27* 26*  CREATININE 1.47* 1.34*  CALCIUM 8.0* 8.0*    PT/INR: No results for input(s): LABPROT, INR in the last 72 hours. ABG    Component Value Date/Time   PHART 7.450 10/13/2019 0314   HCO3 22.5 10/13/2019 0314   TCO2 24 10/13/2019 0314   ACIDBASEDEF 1.0 10/13/2019 0314   O2SAT 64.2 10/14/2019 0650   CBG (last 3)  Recent Labs    10/16/19 1623 10/16/19 2100 10/17/19 0641  GLUCAP 188* 153* 196*    Assessment/Plan: S/P Procedure(s) (LRB): CORONARY ARTERY BYPASS GRAFTING (CABG) x  4 (LIMA to LAD, SVG to OM, SVG to RAMUS INTERMEDIATE, SVG to PDA) with EVH from [place] and LEFT INTERNAL MAMMARY ARTERY HARVEST (N/A) TRANSESOPHAGEAL ECHOCARDIOGRAM (TEE) (N/A) Plan for transfer to step-down: see transfer orders  POD # 5 CV- stable in SR RESP_ improved aeration on CXR this AM RENAL- creatinine improving, back to baseline  [facility] [place] ENDO- CBG moderately elevated, adjust insulin GI- + flatus, still no BM- lactulose, continue Reglan Cardiac rehab   LOS: 6 days    Fred [name] 10/17/2019

## 2019-10-17 NOTE — Progress Notes (Signed)
Pt transferred to 4E-25 via wheelchair from [facility]. Pt to bathroom for BM. Pt given CHG bath. Tele applied, CCMD notified. Pt oriented to call bell, bed and room. Call bell within reach. VSS. Will continue to monitor.

## 2019-10-18 ENCOUNTER — Inpatient Hospital Stay (HOSPITAL_COMMUNITY): Payer: Medicare Other

## 2019-10-18 LAB — BASIC METABOLIC PANEL
Anion gap: 13 (ref 5–15)
BUN: 25 mg/dL — ABNORMAL HIGH (ref 8–23)
CO2: 26 mmol/L (ref 22–32)
Calcium: 8.2 mg/dL — ABNORMAL LOW (ref 8.9–10.3)
Chloride: 96 mmol/L — ABNORMAL LOW (ref 98–111)
Creatinine, Ser: 1.24 mg/dL (ref 0.61–1.24)
GFR calc Af Amer: >60 mL/min (ref >60)
GFR calc non Af Amer: 55 mL/min — ABNORMAL LOW (ref >60)
Glucose, Bld: 204 mg/dL — ABNORMAL HIGH (ref 70–99)
Potassium: 3.7 mmol/L (ref 3.5–5.1)
Sodium: 135 mmol/L (ref 135–145)

## 2019-10-18 LAB — CBC
HCT: 32.5 % — ABNORMAL LOW (ref 39.0–52.0)
Hemoglobin: 10.9 g/dL — ABNORMAL LOW (ref 13.0–17.0)
MCH: 31.1 pg (ref 26.0–34.0)
MCHC: 33.5 g/dL (ref 30.0–36.0)
MCV: 92.9 fL (ref 80.0–100.0)
Platelets: 321 10*3/uL (ref 150–400)
RBC: 3.5 MIL/uL — ABNORMAL LOW (ref 4.22–5.81)
RDW: 13.1 % (ref 11.5–15.5)
WBC: 8.8 10*3/uL (ref 4.0–10.5)
nRBC: 0.2 % (ref 0.0–0.2)

## 2019-10-18 LAB — GLUCOSE, CAPILLARY
Glucose-Capillary: 181 mg/dL — ABNORMAL HIGH (ref 70–99)
Glucose-Capillary: 180 mg/dL — ABNORMAL HIGH (ref 70–99)
Glucose-Capillary: 242 mg/dL — ABNORMAL HIGH (ref 70–99)
Glucose-Capillary: 104 mg/dL — ABNORMAL HIGH (ref 70–99)
Glucose-Capillary: 190 mg/dL — ABNORMAL HIGH (ref 70–99)

## 2019-10-18 MED ORDER — INSULIN DETEMIR 100 UNIT/ML ~~LOC~~ SOLN
28.0000 [IU] | Freq: Every day | SUBCUTANEOUS | Status: DC
  Administered 2019-10-18 – 2019-10-20 (×3): 28 [IU] via SUBCUTANEOUS
  Filled 2019-10-18 (×3): qty 0.28

## 2019-10-18 MED ORDER — AMIODARONE HCL 200 MG PO TABS
200.0000 mg | ORAL_TABLET | Freq: Two times a day (BID) | ORAL | Status: DC
  Administered 2019-10-18: 200 mg via ORAL
  Filled 2019-10-18: qty 1

## 2019-10-18 MED ORDER — POTASSIUM CHLORIDE CRYS ER 20 MEQ PO TBCR
30.0000 meq | EXTENDED_RELEASE_TABLET | Freq: Two times a day (BID) | ORAL | Status: AC
  Administered 2019-10-18 (×2): 30 meq via ORAL
  Filled 2019-10-18 (×2): qty 1

## 2019-10-18 MED ORDER — POTASSIUM CHLORIDE CRYS ER 20 MEQ PO TBCR
20.0000 meq | EXTENDED_RELEASE_TABLET | Freq: Every day | ORAL | Status: DC
  Administered 2019-10-19 – 2019-10-20 (×2): 20 meq via ORAL
  Filled 2019-10-18 (×2): qty 1

## 2019-10-18 MED ORDER — SORBITOL 70 % SOLN
45.0000 mL | Freq: Once | Status: DC
  Filled 2019-10-18: qty 60

## 2019-10-18 MED ORDER — FUROSEMIDE 40 MG PO TABS
40.0000 mg | ORAL_TABLET | Freq: Every day | ORAL | Status: DC
  Administered 2019-10-18 – 2019-10-20 (×3): 40 mg via ORAL
  Filled 2019-10-18 (×3): qty 1

## 2019-10-18 MED ORDER — SALINE SPRAY 0.65 % NA SOLN
1.0000 | NASAL | Status: DC | PRN
  Filled 2019-10-18: qty 44

## 2019-10-18 MED ORDER — POLYETHYLENE GLYCOL 3350 17 G PO PACK
17.0000 g | PACK | Freq: Every day | ORAL | Status: DC
  Administered 2019-10-18: 17 g via ORAL
  Filled 2019-10-18 (×3): qty 1

## 2019-10-18 MED ORDER — SORBITOL 70 % PO SOLN: 45.0000 mL | Freq: Once | ORAL | Status: DC

## 2019-10-18 NOTE — Care Management Important Message (Signed)
Important Message  Patient Details  Name: BRELAND TROUTEN MRN: [id number] Date of Birth: 1941/09/21   Medicare Important Message Given:  Yes     Shelda Altes 10/18/2019, 12:45 PM

## 2019-10-18 NOTE — Progress Notes (Signed)
Pt agreed to walk this AM- went from room to nurse's station w/ rollator. Tolerated fairly well. Assisted to chair per request. Kept on 1L- sats 93-94%. Instructed on importance of incentive spirometer use and had pt demonstrate use to this RN, pulled about 771mLs. Call bell within reach.

## 2019-10-18 NOTE — Progress Notes (Signed)
CARDIAC REHAB PHASE I   PRE:  Rate/Rhythm: 89 SR    BP: sitting 119/66    SaO2: 96 1L  MODE:  Ambulation: 430 ft   POST:  Rate/Rhythm: 98 SR    BP: sitting 132/66     SaO2: 95 1L  Pt needs extra encouragement but overall doing well. Reminders for standing with sternal precautions. Used rollator in hall and 2L, then 1L. He rested x2 due to fatigue. SaO2 remained high on 1L therefore took him off in bed, RN coming in to pull wires. Encouraged more walking and IS. He does have a flight of stairs to get in his house so could get PT to eval.  [id number]   [place], ACSM 10/18/2019 10:28 AM

## 2019-10-18 NOTE — Progress Notes (Signed)
EPW removed per order, ends intact. Pt tolerated well. Instructed pt to remain on bedrest x 1 hour, pt voiced understanding. VSS. Will continue to monitor.  Amanda Cockayne, RN

## 2019-10-18 NOTE — Progress Notes (Addendum)
      [address]       [place] [address]             [phone]        6 Days Post-Op Procedure(s) (LRB): CORONARY ARTERY BYPASS GRAFTING (CABG) x 4 (LIMA to LAD, SVG to OM, SVG to RAMUS INTERMEDIATE, SVG to PDA) with EVH from [place] and LEFT INTERNAL MAMMARY ARTERY HARVEST (N/A) TRANSESOPHAGEAL ECHOCARDIOGRAM (TEE) (N/A)  Subjective: Patient has complaints of dry nose and just does not feel well but cannot give me a specific complaint.  Objective: Vital signs in last 24 hours: Temp:  [97.8 F (36.6 C)-98.9 F (37.2 C)] 98.2 F (36.8 C) (11/09 0546) Pulse Rate:  [66-101] 91 (11/09 0546) Cardiac Rhythm: Normal sinus rhythm (11/09 0115) Resp:  [17-22] 20 (11/09 0546) BP: (92-150)/(60-95) 116/60 (11/09 0546) SpO2:  [91 %-98 %] 93 % (11/09 0546) FiO2 (%):  [28 %] 28 % (11/08 0752) Weight:  [89.5 kg] 89.5 kg (11/09 0546)  Pre op weight 89 kg Current Weight  10/18/19 89.5 kg       Intake/Output from previous day: 11/08 0701 - 11/09 0700 In: 250 [P.O.:250] Out: 1025 [Urine:1025]   Physical Exam:  Cardiovascular: RRR Pulmonary: Slightly diminished bibasilar breath sounds Abdomen: Soft, non tender, bowel sounds present. Extremities: Tracebilateral lower extremity edema. Wounds: RLE wound is clean and dry.  No erythema or signs of infection. Proximal sternal wound with sero sanguinous drainage, likely fat necrosis.  Lab Results: CBC: Recent Labs    10/17/19 0226 10/18/19 0336  WBC 8.5 8.8  HGB 10.9* 10.9*  HCT 32.6* 32.5*  PLT 261 321   BMET:  Recent Labs    10/17/19 0226 10/18/19 0336  NA 135 135  K 4.0 3.7  CL 96* 96*  CO2 27 26  GLUCOSE 172* 204*  BUN 26* 25*  CREATININE 1.34* 1.24  CALCIUM 8.0* 8.2*    PT/INR:  Lab Results  Component Value Date   INR 1.4 (H) 10/12/2019   INR 1.1 10/12/2019   INR 1.1 10/11/2019   ABG:  INR: Will add last result for INR, ABG once components are confirmed Will add  last 4 CBG results once components are confirmed  Assessment/Plan:  1. CV - SR with HR in the 80-90's. On Amiodarone 400 mg bid, Lopressor 25 mg bid. 2.  Pulmonary - On 1 liter of oxygen via [place]. Wean as able. Check PA/LAT CXR. Encourage incentive spirometer 3. Volume Overload - On Lasix 40 mg IV daily 4.  Acute blood loss anemia - H and H stable this am 10.9 and 32.5  5. DM-CBGs 265/209/180. On Insulin but will increase for better glucose control. Glimepiride 4 mg, and Metformin 1000 bid. Pre op HGA1C 9. 6. Supplement potassium 7. Creatinine decreased from 1.34 to 1.24 8. Remove EPW 9. Dressing changes bid and PRN to upper sternal wound.  No sign of infection so will not start antibiotic.  Donielle M ZimmermanPA-C 10/18/2019,7:09 AM   Patient finally had a bowel movement yesterday, major complaint has been addressed Patient ambulating over 300 feet with cardiac rehab  Still requires low-flow oxygen nasal cannula Face-to-face for home nursing, home oxygen, home PT has been placed. Remove temporary epicardial pacing wires today and anticipate discharge home midweek  patient examined and medical record reviewed,agree with above note. Tharon Aquas Trigt III 10/18/2019

## 2019-10-19 DIAGNOSIS — Z951 Presence of aortocoronary bypass graft: Secondary | ICD-10-CM

## 2019-10-19 LAB — CBC
HCT: 32.7 % — ABNORMAL LOW (ref 39.0–52.0)
Hemoglobin: 10.8 g/dL — ABNORMAL LOW (ref 13.0–17.0)
MCH: 31 pg (ref 26.0–34.0)
MCHC: 33 g/dL (ref 30.0–36.0)
MCV: 94 fL (ref 80.0–100.0)
Platelets: 375 10*3/uL (ref 150–400)
RBC: 3.48 MIL/uL — ABNORMAL LOW (ref 4.22–5.81)
RDW: 13.1 % (ref 11.5–15.5)
WBC: 10.3 10*3/uL (ref 4.0–10.5)
nRBC: 0 % (ref 0.0–0.2)

## 2019-10-19 LAB — BASIC METABOLIC PANEL
Anion gap: 10 (ref 5–15)
BUN: 27 mg/dL — ABNORMAL HIGH (ref 8–23)
CO2: 28 mmol/L (ref 22–32)
Calcium: 8.3 mg/dL — ABNORMAL LOW (ref 8.9–10.3)
Chloride: 97 mmol/L — ABNORMAL LOW (ref 98–111)
Creatinine, Ser: 1.3 mg/dL — ABNORMAL HIGH (ref 0.61–1.24)
GFR calc Af Amer: >60 mL/min (ref >60)
GFR calc non Af Amer: 52 mL/min — ABNORMAL LOW (ref >60)
Glucose, Bld: 207 mg/dL — ABNORMAL HIGH (ref 70–99)
Potassium: 4 mmol/L (ref 3.5–5.1)
Sodium: 135 mmol/L (ref 135–145)

## 2019-10-19 LAB — GLUCOSE, CAPILLARY
Glucose-Capillary: 192 mg/dL — ABNORMAL HIGH (ref 70–99)
Glucose-Capillary: 242 mg/dL — ABNORMAL HIGH (ref 70–99)
Glucose-Capillary: 130 mg/dL — ABNORMAL HIGH (ref 70–99)
Glucose-Capillary: 167 mg/dL — ABNORMAL HIGH (ref 70–99)

## 2019-10-19 MED ORDER — AMIODARONE HCL 200 MG PO TABS
200.0000 mg | ORAL_TABLET | Freq: Every day | ORAL | Status: DC
  Administered 2019-10-19: 200 mg via ORAL
  Filled 2019-10-19: qty 1

## 2019-10-19 MED ORDER — ASPIRIN 325 MG PO TBEC: 325.0000 mg | DELAYED_RELEASE_TABLET | Freq: Every day | ORAL | 0 refills | Status: DC

## 2019-10-19 NOTE — Progress Notes (Addendum)
      [address]       [place] [address]             [phone]        7 Days Post-Op Procedure(s) (LRB): CORONARY ARTERY BYPASS GRAFTING (CABG) x 4 (LIMA to LAD, SVG to OM, SVG to RAMUS INTERMEDIATE, SVG to PDA) with EVH from [place] and LEFT INTERNAL MAMMARY ARTERY HARVEST (N/A) TRANSESOPHAGEAL ECHOCARDIOGRAM (TEE) (N/A)  Subjective: Patient eating breakfast this am. He hopes to go home soon.  Objective: Vital signs in last 24 hours: Temp:  [97.8 F (36.6 C)-98.4 F (36.9 C)] 98.4 F (36.9 C) (11/10 0537) Pulse Rate:  [78-87] 79 (11/10 0537) Cardiac Rhythm: Normal sinus rhythm (11/10 0330) Resp:  [14-20] 18 (11/10 0537) BP: (107-127)/(61-73) 126/68 (11/10 0537) SpO2:  [91 %-98 %] 91 % (11/10 0537) Weight:  [88.6 kg] 88.6 kg (11/10 0537)  Pre op weight 89 kg Current Weight  10/19/19 88.6 kg       Intake/Output from previous day: 11/09 0701 - 11/10 0700 In: 240 [P.O.:240] Out: 1100 [Urine:1100]   Physical Exam:  Cardiovascular: RRR Pulmonary: Slightly diminished bibasilar breath sounds L>R Abdomen: Soft, non tender, bowel sounds present. Extremities: Trace bilateral lower extremity edema. Wounds: RLE wound is clean and dry.  No erythema or signs of infection. Proximal sternal wound with trace sero sanguinous drainage, likely fat necrosis.  Lab Results: CBC: Recent Labs    10/18/19 0336 10/19/19 0311  WBC 8.8 10.3  HGB 10.9* 10.8*  HCT 32.5* 32.7*  PLT 321 375   BMET:  Recent Labs    10/18/19 0336 10/19/19 0311  NA 135 135  K 3.7 4.0  CL 96* 97*  CO2 26 28  GLUCOSE 204* 207*  BUN 25* 27*  CREATININE 1.24 1.30*  CALCIUM 8.2* 8.3*    PT/INR:  Lab Results  Component Value Date   INR 1.4 (H) 10/12/2019   INR 1.1 10/12/2019   INR 1.1 10/11/2019   ABG:  INR: Will add last result for INR, ABG once components are confirmed Will add last 4 CBG results once components are  confirmed  Assessment/Plan:  1. CV - SR with HR in the 80-90's. On Amiodarone 200 mg bid, Lopressor 25 mg bid. Of note, QTc 563 so will  decrease Amiodarone to 200 mg daily. He did NOT have a fib, had bigemny, which has resolved 2.  Pulmonary - On 1 liter of oxygen via [place]. Will ask nurse to document to see if will need home oxygen. Wean as able. Encourage incentive spirometer 3. Volume Overload - On Lasix 40 mg IV daily and has had good diuresis. Will change to oral 4.  Acute blood loss anemia - H and H stable this am 10. and 32.57 5. DM-CBGs 104/190/192. On Insulin, Glimepiride 4 mg, and Metformin 1000 bid. Pre op HGA1C 9.Preop poorly controlled diabetes mellitus 6. Dressing changes bid and PRN to upper sternal wound.  Drainage is scant this am. No sign of infection so will not start antibiotic. 7. Remove chest tube sutures in am 8. Hopefully, home in am  Donielle M ZimmermanPA-C 10/19/2019,7:06 AM   Pulmonary status better  patient examined and medical record reviewed,agree with above note. Tharon Aquas Trigt III 10/19/2019

## 2019-10-19 NOTE — Progress Notes (Signed)
CARDIAC REHAB PHASE I   PRE:  Rate/Rhythm: 84 SR    BP: sitting 108/74    SaO2: 92 RA  MODE:  Ambulation: 430 ft   POST:  Rate/Rhythm: 90 SR    BP: sitting 131/74     SaO2: 95 RA  Pt moving well. Able to stand independently (brief reminder of sternal precautions). Used rollator as he feels more comfortable with it. Steady, more upright today. Quicker pace, no rest needed. Pt would like rollator for home. Return to recliner, SAO2 in 90s during walk and after. Reminded pt of more walking and IS. Please try sleep without O2 tonight if possible. [phone]   [place], ACSM 10/19/2019 10:05 AM

## 2019-10-20 LAB — GLUCOSE, CAPILLARY: Glucose-Capillary: 163 mg/dL — ABNORMAL HIGH (ref 70–99)

## 2019-10-20 MED ORDER — FUROSEMIDE 40 MG PO TABS: 40.0000 mg | ORAL_TABLET | Freq: Every day | ORAL | 0 refills | Status: DC

## 2019-10-20 MED ORDER — TRAMADOL HCL 50 MG PO TABS: 50.0000 mg | ORAL_TABLET | Freq: Four times a day (QID) | ORAL | 0 refills | Status: DC | PRN

## 2019-10-20 MED ORDER — METOPROLOL TARTRATE 25 MG PO TABS: 25.0000 mg | ORAL_TABLET | Freq: Two times a day (BID) | ORAL | 1 refills | Status: DC

## 2019-10-20 MED ORDER — POTASSIUM CHLORIDE CRYS ER 20 MEQ PO TBCR: 20.0000 meq | EXTENDED_RELEASE_TABLET | Freq: Every day | ORAL | 0 refills | Status: DC

## 2019-10-20 NOTE — Progress Notes (Signed)
Patient ambulated in the hallway using front wheel walker about 750 ft on room air tolerated very well.O2 saturation post ambulation is 94%.

## 2019-10-20 NOTE — Progress Notes (Addendum)
Progress Note  Patient Name: ELZIE KNISLEY Date of Encounter: 10/20/2019  Primary Cardiologist: No primary care provider on file.   Subjective   No complaints this morning. Hopeful to go home.   Inpatient Medications    Scheduled Meds: . aspirin EC  325 mg Oral Daily   Or  . aspirin  324 mg Per Tube Daily  . bisacodyl  10 mg Oral Daily   Or  . bisacodyl  10 mg Rectal Daily  . darifenacin  7.5 mg Oral Daily  . enoxaparin (LOVENOX) injection  40 mg Subcutaneous Q24H  . furosemide  40 mg Oral Daily  . glimepiride  4 mg Oral Q breakfast  . insulin aspart  0-15 Units Subcutaneous TID WC  . insulin aspart  0-5 Units Subcutaneous QHS  . insulin detemir  28 Units Subcutaneous Daily  . metFORMIN  1,000 mg Oral BID WC  . metoprolol tartrate  25 mg Oral BID   Or  . metoprolol tartrate  25 mg Per Tube BID  . pantoprazole  40 mg Oral Daily  . polyethylene glycol  17 g Oral Daily  . potassium chloride  20 mEq Oral Daily  . simvastatin  40 mg Oral q1800  . sodium chloride flush  3 mL Intravenous Q12H  . sorbitol  45 mL Oral Once   Continuous Infusions: . sodium chloride     PRN Meds: sodium chloride, levalbuterol, magnesium hydroxide, ondansetron (ZOFRAN) IV, oxyCODONE, sodium chloride, sodium chloride flush, traMADol   Vital Signs    Vitals:   10/19/19 0537 10/19/19 1559 10/19/19 1949 10/20/19 0320  BP: 126/68 118/69 127/64 (!) 107/55  Pulse: 79 82 86 98  Resp: 18 17 16 14   Temp: 98.4 F (36.9 C) 99 F (37.2 C) 98.9 F (37.2 C) 98.3 F (36.8 C)  TempSrc: Oral Oral Oral Oral  SpO2: 91% 95% 98% 96%  Weight: 88.6 kg   88 kg  Height:        Intake/Output Summary (Last 24 hours) at 10/20/2019 0910 Last data filed at 10/20/2019 0800 Gross per 24 hour  Intake 600 ml  Output 1650 ml  Net -1050 ml   Last 3 Weights 10/20/2019 10/19/2019 10/18/2019  Weight (lbs) 194 lb 1.6 oz 195 lb 4.8 oz 197 lb 4.8 oz  Weight (kg) 88.043 kg 88.587 kg 89.495 kg      Telemetry    SR - Personally Reviewed  Physical Exam  Older WM, laying in bed. GEN: No acute distress.   Neck: No JVD Cardiac: RRR, no murmurs, rubs, or gallops.  Respiratory: Clear to auscultation bilaterally. GI: Soft, nontender, non-distended  MS: No edema; No deformity.  Neuro:  Nonfocal  Psych: Normal affect   Labs    High Sensitivity Troponin:  No results for input(s): TROPONINIHS in the last 720 hours.    Chemistry Recent Labs  Lab 10/17/19 0226 10/18/19 0336 10/19/19 0311  NA 135 135 135  K 4.0 3.7 4.0  CL 96* 96* 97*  CO2 27 26 28   GLUCOSE 172* 204* 207*  BUN 26* 25* 27*  CREATININE 1.34* 1.24 1.30*  CALCIUM 8.0* 8.2* 8.3*  GFRNONAA 50* 55* 52*  GFRAA 58* >60 >60  ANIONGAP 12 13 10      Hematology Recent Labs  Lab 10/17/19 0226 10/18/19 0336 10/19/19 0311  WBC 8.5 8.8 10.3  RBC 3.43* 3.50* 3.48*  HGB 10.9* 10.9* 10.8*  HCT 32.6* 32.5* 32.7*  MCV 95.0 92.9 94.0  MCH 31.8 31.1 31.0  MCHC 33.4 33.5 33.0  RDW 13.2 13.1 13.1  PLT 261 321 375    BNPNo results for input(s): BNP, PROBNP in the last 168 hours.   DDimer No results for input(s): DDIMER in the last 168 hours.   Radiology    No results found.  Cardiac Studies   Cath: 10/11/19   Mid LM to Dist LM lesion is 80% stenosed.  Ost LAD to Prox LAD lesion is 90% stenosed.  Ost Cx to Prox Cx lesion is 90% stenosed.  Prox RCA to Mid RCA lesion is 90% stenosed.   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  He has surgical anatomy.  Given the severe nature of his blockage I favor keeping him for CABG.  T CTS has been consulted.  The sheath was removed and a TR band was placed on the right wrist to achieve patent hemostasis.  The patient left the lab in stable condition.  Nanetta Batty. MD, [facility]  Diagnostic Dominance: Right    Patient Profile     78 y.o. male with PMH of HL and DM who presented with chest pain for outpatient office visit.  Underwent Coronary CT which was positive for 3v disease. Outpatient cath noted above, and TCTS consulted for CABG.   Assessment & Plan    1. CAD: now s/p 4vCABG and progressing well. Overall uncomplicated course. He is treated with full dose ASA, BB and statin.   2. HL: reports he has been on simvastatin for many years. Never been tried on other. Would consider switching to high dose given CAD.   3. DM: on metformin and glimepiride PTA  [facility] will sign off.   Medication Recommendations:  Noted above Other recommendations (labs, testing, etc):  none Follow up as an outpatient:  2 weeks in office follow up.   For questions or updates, please contact [facility] Please consult [email or web address] for contact info under   Signed, Laverda Page, NP  10/20/2019, 9:10 AM    Patient seen, examined. Available data reviewed. Agree with findings, assessment, and plan as outlined by Laverda Page, NP.  On exam he is alert, oriented, in no distress.  Lungs are clear, heart is regular rate and rhythm no murmur gallop, JVP is normal, extremities have no edema.  Medications are reviewed and he is treated with aspirin, a statin drug, and a beta-blocker.  He has follow-up scheduled with Dr. Allyson Sabal in 2 weeks.  Patient's wife is at the bedside.  All of their questions are answered.  Tonny Bollman, M.D. 10/20/2019 11:50 AM

## 2019-10-20 NOTE — Progress Notes (Addendum)
      [address]       [place] [address]             [phone]        8 Days Post-Op Procedure(s) (LRB): CORONARY ARTERY BYPASS GRAFTING (CABG) x 4 (LIMA to LAD, SVG to OM, SVG to RAMUS INTERMEDIATE, SVG to PDA) with EVH from [place] and LEFT INTERNAL MAMMARY ARTERY HARVEST (N/A) TRANSESOPHAGEAL ECHOCARDIOGRAM (TEE) (N/A)  Subjective: Patient has no specific complaints this am and is looking forward to going home.   Objective: Vital signs in last 24 hours: Temp:  [98.3 F (36.8 C)-99 F (37.2 C)] 98.3 F (36.8 C) (11/11 0320) Pulse Rate:  [82-98] 98 (11/11 0320) Cardiac Rhythm: Normal sinus rhythm (11/11 0310) Resp:  [14-17] 14 (11/11 0320) BP: (107-127)/(55-69) 107/55 (11/11 0320) SpO2:  [95 %-98 %] 96 % (11/11 0320) Weight:  [88 kg] 88 kg (11/11 0320)  Pre op weight 89 kg Current Weight  10/20/19 88 kg       Intake/Output from previous day: 11/10 0701 - 11/11 0700 In: 480 [P.O.:480] Out: 1650 [Urine:1650]   Physical Exam:  Cardiovascular: RRR Pulmonary: Mostly clear Abdomen: Soft, non tender, bowel sounds present. Extremities: Trace bilateral lower extremity edema. Wounds: RLE wound is clean and dry.  No erythema or signs of infection. Proximal sternal wound with no further drainage  Lab Results: CBC: Recent Labs    10/18/19 0336 10/19/19 0311  WBC 8.8 10.3  HGB 10.9* 10.8*  HCT 32.5* 32.7*  PLT 321 375   BMET:  Recent Labs    10/18/19 0336 10/19/19 0311  NA 135 135  K 3.7 4.0  CL 96* 97*  CO2 26 28  GLUCOSE 204* 207*  BUN 25* 27*  CREATININE 1.24 1.30*  CALCIUM 8.2* 8.3*    PT/INR:  Lab Results  Component Value Date   INR 1.4 (H) 10/12/2019   INR 1.1 10/12/2019   INR 1.1 10/11/2019   ABG:  INR: Will add last result for INR, ABG once components are confirmed Will add last 4 CBG results once components are confirmed  Assessment/Plan:  1. CV - SR with HR in the 80's. On Amiodarone 200  mg bid, Lopressor 25 mg bid.  He did NOT have a fib, had bigemny, which has resolved so will stop Amiodarone. 2.  Pulmonary - On room air. Encourage incentive spirometer 3. Volume Overload - On Lasix 40 mg PO daily. 4.  Acute blood loss anemia - H and H stable this am 10. and 32.57 5. DM-CBGs 130/167/163. On Insulin, Glimepiride 4 mg, and Metformin 1000 bid. Pre op HGA1C 9.Pre op poorly controlled diabetes mellitus 6. Remove chest tube sutures  7. Discharge  Donielle M ZimmermanPA-C 10/20/2019,7:41 AM   DC instructions reviewed with patient- wound care, activity limits, medications , diet, followup patient examined and medical record reviewed,agree with above note. Tharon Aquas Trigt III 10/20/2019

## 2019-10-20 NOTE — Progress Notes (Signed)
Discussed sternal precautions, IS, exercise, diet, and CRPII with pt and wife.  Receptive but still feels weak and seems to struggle with motivation. Will refer to [place]. Not interested in virtual option. [id number] Yves Dill CES, ACSM 10:38 AM 10/20/2019

## 2019-10-20 NOTE — TOC Transition Note (Signed)
Transition of Care [facility]) - CM/SW Discharge Note Marvetta Gibbons RN, BSN Transitions of Care Unit 4E- RN Case Manager [phone]   Patient Details  Name: Fred [name] MRN: [id number] Date of Birth: 03-Dec-1941  Transition [facility] Care [facility]) CM/SW Contact:  Dawayne Patricia, RN Phone Number: 10/20/2019, 4:46 PM   Clinical Narrative:    Pt stable for transition home today, orders placed for HHRN/PT and DME needs- spoke with pt at bedside choice offered with list Per CMS guidelines from medicare.gov website with star ratings (copy placed in shadow chart)- per pt he would like to use [facility] for services and is fine with [employer] providing rollator prior to discharge to room- notified Zach with [employer] for DME needs- rollator to be delivered to room prior to discharge- call made to Butch Penny with [facility] for [facility] referral- referral has been accepted for HHRN/PT needs.    Final next level of care: [place] Barriers to Discharge: Barriers Resolved   Patient Goals and CMS Choice Patient states their goals for this hospitalization and ongoing recovery are:: to return home CMS Medicare.gov Compare Post Acute Care list provided to:: Patient Choice offered to / list presented to : Patient  Discharge Placement                  Home with [facility]      Discharge Plan and Services   Discharge Planning Services: CM Consult Post Acute Care Choice: Home Health, Durable Medical Equipment          DME Arranged: Walker rolling with seat DME Agency: [employer] Date DME Agency Contacted: 10/19/19 Time DME Agency Contacted: 1600 Representative spoke with at DME Agency: [place]: RN, PT [place] Agency: [place] ([place]) Date [place]: 10/20/19 Time [place]: 1100 Representative spoke with at [place]: [place] ([place]) Interventions     Readmission Risk Interventions No flowsheet data found.

## 2019-10-21 ENCOUNTER — Other Ambulatory Visit: Payer: Self-pay | Admitting: Physician Assistant

## 2019-10-21 DIAGNOSIS — Z48812 Encounter for surgical aftercare following surgery on the circulatory system: Secondary | ICD-10-CM

## 2019-10-27 ENCOUNTER — Telehealth: Payer: Self-pay | Admitting: Cardiovascular Disease

## 2019-10-27 NOTE — Telephone Encounter (Signed)
New message   Patient states that he wants his wife to come with him to appt on 11/01/19 due to patient just recently having heart surgery.  Please advise.

## 2019-11-01 ENCOUNTER — Other Ambulatory Visit: Payer: Self-pay

## 2019-11-01 ENCOUNTER — Ambulatory Visit (INDEPENDENT_AMBULATORY_CARE_PROVIDER_SITE_OTHER): Payer: Medicare Other | Admitting: Cardiovascular Disease

## 2019-11-01 ENCOUNTER — Encounter: Payer: Self-pay | Admitting: Cardiovascular Disease

## 2019-11-01 VITALS — BP 94/50 | HR 81 | Ht 67.0 in | Wt 201.2 lb

## 2019-11-01 DIAGNOSIS — E782 Mixed hyperlipidemia: Secondary | ICD-10-CM | POA: Diagnosis not present

## 2019-11-01 DIAGNOSIS — I25118 Atherosclerotic heart disease of native coronary artery with other forms of angina pectoris: Secondary | ICD-10-CM | POA: Diagnosis not present

## 2019-11-01 DIAGNOSIS — R55 Syncope and collapse: Secondary | ICD-10-CM

## 2019-11-01 DIAGNOSIS — R42 Dizziness and giddiness: Secondary | ICD-10-CM

## 2019-11-01 DIAGNOSIS — Z951 Presence of aortocoronary bypass graft: Secondary | ICD-10-CM

## 2019-11-01 NOTE — Progress Notes (Signed)
11/01/2019 Fred [name]   04/20/1941  [id number]  Primary Physician Shirline Frees, MD Primary Cardiologist: Lorretta Harp MD [name], [place]  HPI:  Fred [name] is a 78 y.o.  mildly overweight weight married Caucasian male father of 2, grandfather of 4 grandchildren who is referred by the ER and Dr. Kenton Kingfisher for evaluation of dizziness. He retired in 2000 from working at [employer] in [employer].  I last saw him in the office 10/05/2019.His cardiac risk factors include treated diabetes and hyperlipidemia. He does not smoke. He does have a family history of heart disease with a mother that had CABG and a younger brother who died as well which was found in the evaluation of syncope. He is never had a heart attack or stroke. He denies chest pain or shortness of breath. He is fairly active and routinely walked 3 miles a day up until COVID-19 when he became more sedentary however recently he is try to increase his activity. On 08/30/2019 he went out for his morning run of 3 miles and at the end he felt weak. When driving back home he thought that he may have passed out but he was dizzy. He went to the ER where he was nauseated as well. ER evaluation was unrevealing. He said he felt a little bit of chest discomfort this morning when coming to the office but otherwise specifically denied the symptoms.  I performed event monitoring which showed predominantly sinus rhythm with occasional PACs, PVCs and short runs of PSVT. A coronary CTA did show three-vessel disease.  Based on this, I recommended that we proceed with outpatient diagnostic coronary angiography which was done on 10/11/2019 revealing left main/three-vessel disease.  The following day he underwent CABG x4 by Dr. Darcey Nora with a LIMA to his LAD, vein to ramus branch, obtuse marginal branch and to the PDA.  He was discharged home on 10/20/2019 and has been doing well since.    Current Meds   Medication Sig  . aspirin EC 325 MG EC tablet Take 1 tablet (325 mg total) by mouth daily.  . B-D UF III MINI PEN NEEDLES 31G X 5 MM MISC USE TO CHECK BLOOD SUGARS ONCE A DAY  . clotrimazole (LOTRIMIN) 1 % cream Apply 1 application topically as needed (Groin).  . furosemide (LASIX) 40 MG tablet Take 1 tablet (40 mg total) by mouth daily. For 4 days then stop  . glimepiride (AMARYL) 4 MG tablet Take 4 mg by mouth 2 (two) times daily.   . Insulin Glargine, 1 Unit Dial, (TOUJEO SOLOSTAR) 300 UNIT/ML SOPN Inject 60 mg into the skin daily.   . metFORMIN (GLUCOPHAGE) 1000 MG tablet Take 1,000 mg by mouth 2 (two) times daily with a meal.   . metoprolol tartrate (LOPRESSOR) 25 MG tablet Take 1 tablet (25 mg total) by mouth 2 (two) times daily. (Patient taking differently: Take 12.5 mg by mouth 2 (two) times daily. )  . Multiple Vitamins-Minerals (MULTIVITAMIN WITH MINERALS) tablet Take 1 tablet by mouth daily.  . potassium chloride SA (KLOR-CON) 20 MEQ tablet Take 1 tablet (20 mEq total) by mouth daily. For 4 days then stop.  . simvastatin (ZOCOR) 40 MG tablet Take 40 mg by mouth daily at 6 PM.   . solifenacin (VESICARE) 5 MG tablet Take 5 mg by mouth daily.   . traMADol (ULTRAM) 50 MG tablet Take 1 tablet (50 mg total) by mouth every 6 (six) hours as needed for  moderate pain.     No Known Allergies  Social History   Socioeconomic History  . Marital status: Married    Spouse name: Not on file  . Number of children: Not on file  . Years of education: Not on file  . Highest education level: Not on file  Occupational History  . Not on file  Social Needs  . Financial resource strain: Not on file  . Food insecurity    Worry: Not on file    Inability: Not on file  . Transportation needs    Medical: Not on file    Non-medical: Not on file  Tobacco Use  . Smoking status: Never Smoker  . Smokeless tobacco: Never Used  Substance and Sexual Activity  . Alcohol use: No  . Drug use: No  .  Sexual activity: Not on file  Lifestyle  . Physical activity    Days per week: Not on file    Minutes per session: Not on file  . Stress: Not on file  Relationships  . Social Musicianconnections    Talks on phone: Not on file    Gets together: Not on file    Attends religious service: Not on file    Active member of club or organization: Not on file    Attends meetings of clubs or organizations: Not on file    Relationship status: Not on file  . Intimate partner violence    Fear of current or ex partner: Not on file    Emotionally abused: Not on file    Physically abused: Not on file    Forced sexual activity: Not on file  Other Topics Concern  . Not on file  Social History Narrative  . Not on file     Review of Systems: General: negative for chills, fever, night sweats or weight changes.  Cardiovascular: negative for chest pain, dyspnea on exertion, edema, orthopnea, palpitations, paroxysmal nocturnal dyspnea or shortness of breath Dermatological: negative for rash Respiratory: negative for cough or wheezing Urologic: negative for hematuria Abdominal: negative for nausea, vomiting, diarrhea, bright red blood per rectum, melena, or hematemesis Neurologic: negative for visual changes, syncope, or dizziness All other systems reviewed and are otherwise negative except as noted above.    Blood pressure (!) 94/50, pulse 81, height 5\' 7"  (1.702 m), weight 201 lb 3.2 oz (91.3 kg), SpO2 98 %.  General appearance: alert and no distress Neck: no adenopathy, no carotid bruit, no JVD, supple, symmetrical, trachea midline and thyroid not enlarged, symmetric, no tenderness/mass/nodules Lungs: clear to auscultation bilaterally Heart: regular rate and rhythm, S1, S2 normal, no murmur, click, rub or gallop Extremities: extremities normal, atraumatic, no cyanosis or edema Pulses: 2+ and symmetric Skin: Skin color, texture, turgor normal. No rashes or lesions Neurologic: Alert and oriented X 3,  normal strength and tone. Normal symmetric reflexes. Normal coordination and gait  EKG normal sinus rhythm at 81 with a rightward axis and nonspecific ST and T wave changes with an occasional PVC.  I personally reviewed this EKG.  ASSESSMENT AND PLAN:   Hyperlipidemia History of hyperlipidemia on statin therapy with lipid profile performed 06/07/2019 revealing total cholesterol of 116, LDL 60 and HDL 32.  Dizziness History of dizziness in the past which took him to the emergency room for evaluation.  Subsequent event monitor did show occasional PACs, PVCs and short runs of PSVT.  S/P CABG x 4 History of CAD status post recent CABG x4 by Dr. Maren BeachVantrigt with a LIMA to  the LAD, vein to an obtuse marginal branch, vein to a ramus branch and to the PDA performed 10/12/2019 after cardiac catheterization performed by myself 10/11/2019 revealed left main/three-vessel disease.  Prior to that he did have his CTA that showed three-vessel disease as well.  He was discharged home from the hospital on 10/20/2019, 2 weeks ago, and is recuperating nicely.      Runell Gess MD FACP,FACC,FAHA, [facility] 11/01/2019 9:25 AM

## 2019-11-01 NOTE — Assessment & Plan Note (Signed)
History of CAD status post recent CABG x4 by Dr. Darcey Nora with a LIMA to the LAD, vein to an obtuse marginal branch, vein to a ramus branch and to the PDA performed 10/12/2019 after cardiac catheterization performed by myself 10/11/2019 revealed left main/three-vessel disease.  Prior to that he did have his CTA that showed three-vessel disease as well.  He was discharged home from the hospital on 10/20/2019, 2 weeks ago, and is recuperating nicely.

## 2019-11-01 NOTE — Assessment & Plan Note (Signed)
History of dizziness in the past which took him to the emergency room for evaluation.  Subsequent event monitor did show occasional PACs, PVCs and short runs of PSVT.

## 2019-11-01 NOTE — Assessment & Plan Note (Signed)
History of hyperlipidemia on statin therapy with lipid profile performed 06/07/2019 revealing total cholesterol of 116, LDL 60 and HDL 32.

## 2019-11-01 NOTE — Patient Instructions (Signed)
Medication Instructions:  Your physician recommends that you continue on your current medications as directed. Please refer to the Current Medication list given to you today.  If you need a refill on your cardiac medications before your next appointment, please call your pharmacy.   Lab work: NONE  Testing/Procedures: NONE  Follow-Up: At [facility], you and your health needs are our priority.  As part of our continuing mission to provide you with exceptional heart care, we have created designated Provider Care Teams.  These Care Teams include your primary Cardiologist (physician) and Advanced Practice Providers (APPs -  Physician Assistants and Nurse Practitioners) who all work together to provide you with the care you need, when you need it. You may see Dr Gwenlyn Found or one of the following Advanced Practice Providers on your designated Care Team:    Kerin Ransom, PA-C  [place], [place]  Coletta Memos, [place]  Your physician wants you to follow-up in: 1 year. You will receive a reminder letter in the mail two months in advance. If you don't receive a letter, please call our office to schedule the follow-up appointment.  Any Other Special Instructions Will Be Listed Below (If Applicable). Make an appointment to follow-up with an PA in 6 months.

## 2019-11-02 ENCOUNTER — Encounter (HOSPITAL_COMMUNITY): Payer: Self-pay

## 2019-11-02 ENCOUNTER — Telehealth (HOSPITAL_COMMUNITY): Payer: Self-pay

## 2019-11-02 NOTE — Telephone Encounter (Signed)
Attempted to call patient in regards to Cardiac Rehab - LM on VM Mailed letter 

## 2019-11-03 ENCOUNTER — Telehealth: Payer: Self-pay | Admitting: Cardiovascular Disease

## 2019-11-03 NOTE — Telephone Encounter (Signed)
Patient is having trouble sleeping and would like to know if Dr. Gwenlyn Found can prescribe him some sleeping medication.

## 2019-11-03 NOTE — Telephone Encounter (Signed)
Spoke to patient . Informed patient  Dr Gwenlyn Found will not be able to give him prescription for sleep agent .  [place] Pharmacist Recommend using Benadryl at least  Over 2 to 3  nights to see if it helps, if not please contact primary Doctor. Patient verbalized understanding.

## 2019-11-16 ENCOUNTER — Other Ambulatory Visit: Payer: Self-pay | Admitting: Cardiothoracic Surgery

## 2019-11-16 DIAGNOSIS — Z951 Presence of aortocoronary bypass graft: Secondary | ICD-10-CM

## 2019-11-17 ENCOUNTER — Ambulatory Visit
Admission: RE | Admit: 2019-11-17 | Discharge: 2019-11-17 | Disposition: A | Discharge status: Home / Self Care | Payer: Medicare Other | Source: Ambulatory Visit | Attending: Cardiothoracic Surgery | Admitting: Cardiothoracic Surgery

## 2019-11-17 ENCOUNTER — Ambulatory Visit (INDEPENDENT_AMBULATORY_CARE_PROVIDER_SITE_OTHER): Payer: Self-pay | Admitting: Cardiothoracic Surgery

## 2019-11-17 ENCOUNTER — Other Ambulatory Visit: Payer: Self-pay

## 2019-11-17 ENCOUNTER — Encounter: Payer: Self-pay | Admitting: Cardiothoracic Surgery

## 2019-11-17 ENCOUNTER — Telehealth (HOSPITAL_COMMUNITY): Payer: Self-pay

## 2019-11-17 VITALS — BP 115/69 | HR 82 | Temp 97.5°F | Resp 16 | Ht 67.0 in | Wt 191.4 lb

## 2019-11-17 DIAGNOSIS — I2510 Atherosclerotic heart disease of native coronary artery without angina pectoris: Secondary | ICD-10-CM

## 2019-11-17 DIAGNOSIS — Z951 Presence of aortocoronary bypass graft: Secondary | ICD-10-CM

## 2019-11-17 NOTE — Progress Notes (Signed)
PCP is Shirline Frees, MD Referring Provider is Lorretta Harp, MD  Chief Complaint  Patient presents with  . Routine Post Op    s/p CABG X 4...10/12/19.Marland KitchenMarland Kitchenwith a CXR  78 year old diabetic male for post CABG follow-up  HPI: Patient returns for scheduled follow-up 6 weeks after CABG x4 for unstable angina and three-vessel CAD.  The patient's EF was 55%.  He did well postop and maintained sinus rhythm.  He was discharged home on Zocor, aspirin, metoprolol 12.5 twice daily, and Metformin.  He is doing well with improved exercise tolerance strength and wellbeing.  No recurrent angina.  Surgical incisions well-healed.  No symptoms of CHF.  Chest x-ray taken today is personally reviewed showing clear lung fields, no pleural effusion, sternal wires intact.  Patient is ready to start driving, start cardiac rehab at the hospital, will need to stay on his current meds.  Past Medical History:  Diagnosis Date  . Diabetes mellitus without complication ([place])   . High cholesterol   . Vertigo     Past Surgical History:  Procedure Laterality Date  . CORONARY ARTERY BYPASS GRAFT N/A 10/12/2019   Procedure: CORONARY ARTERY BYPASS GRAFTING (CABG) x 4 (LIMA to LAD, SVG to OM, SVG to RAMUS INTERMEDIATE, SVG to PDA) with EVH from [place] and LEFT INTERNAL MAMMARY ARTERY HARVEST;  Surgeon: Ivin Poot, MD;  Location: [facility];  Service: Open Heart Surgery;  Laterality: N/A;  . FOOT SURGERY    . KNEE SURGERY    . LEFT HEART CATH AND CORONARY ANGIOGRAPHY N/A 10/11/2019   Procedure: LEFT HEART CATH AND CORONARY ANGIOGRAPHY;  Surgeon: Lorretta Harp, MD;  Location: [facility];  Service: Cardiovascular;  Laterality: N/A;  . NASAL SEPTUM SURGERY    . TEE WITHOUT CARDIOVERSION N/A 10/12/2019   Procedure: TRANSESOPHAGEAL ECHOCARDIOGRAM (TEE);  Surgeon: Prescott Gum, Collier Salina, MD;  Location: [facility];  Service: Open Heart Surgery;  Laterality: N/A;    History reviewed. No pertinent family  history.  Social History Social History   Tobacco Use  . Smoking status: Never Smoker  . Smokeless tobacco: Never Used  Substance Use Topics  . Alcohol use: No  . Drug use: No    Current Outpatient Medications  Medication Sig Dispense Refill  . aspirin EC 325 MG EC tablet Take 1 tablet (325 mg total) by mouth daily. 30 tablet 0  . B-D UF III MINI PEN NEEDLES 31G X 5 MM MISC USE TO CHECK BLOOD SUGARS ONCE A DAY    . clotrimazole (LOTRIMIN) 1 % cream Apply 1 application topically as needed (Groin).    Marland Kitchen glimepiride (AMARYL) 4 MG tablet Take 4 mg by mouth 2 (two) times daily.     . Insulin Glargine, 1 Unit Dial, (TOUJEO SOLOSTAR) 300 UNIT/ML SOPN Inject 60 mg into the skin daily.     . metFORMIN (GLUCOPHAGE) 1000 MG tablet Take 1,000 mg by mouth 2 (two) times daily with a meal.     . metoprolol tartrate (LOPRESSOR) 25 MG tablet Take 1 tablet (25 mg total) by mouth 2 (two) times daily. (Patient taking differently: Take 12.5 mg by mouth 2 (two) times daily. ) 60 tablet 1  . Multiple Vitamins-Minerals (MULTIVITAMIN WITH MINERALS) tablet Take 1 tablet by mouth daily.    . simvastatin (ZOCOR) 40 MG tablet Take 40 mg by mouth daily at 6 PM.     . solifenacin (VESICARE) 5 MG tablet Take 5 mg by mouth daily.  No current facility-administered medications for this visit.     Review of symptoms No fever No edema Appetite improving   BP 115/69 (BP Location: Right Arm, Patient Position: Sitting, Cuff Size: Normal)   Pulse 82   Temp (!) 97.5 F (36.4 C)   Resp 16   Ht 5\' 7"  (1.702 m)   Wt 191 lb 6.4 oz (86.8 kg)   SpO2 98% Comment: RA  BMI 29.98 kg/m  Physical Exam      Exam    General- alert and comfortable    Neck- no JVD, no cervical adenopathy palpable, no carotid bruit   Lungs- clear without rales, wheezes   Cor- regular rate and rhythm, no murmur , gallop   Abdomen- soft, non-tender   Extremities - warm, non-tender, minimal edema   Neuro- oriented, appropriate, no  focal weakness   Diagnostic Tests: Chest x-ray today is clear  Impression: Excellent early recovery after multivessel CABG Patient will start cardiac rehab He knows he can resume driving he may lift up to 10 pounds at this time Continue current meds  Plan: I will see the patient back in 2 months for final postoperative review and to assess his progress.   , MD [facility] [phone]

## 2019-11-17 NOTE — Telephone Encounter (Signed)
No response from pt regarding CR.  Closed referral.  

## 2019-11-18 NOTE — Telephone Encounter (Signed)
Attempted to call patient in regards to Cardiac Rehab - LM on VM 

## 2019-12-06 ENCOUNTER — Telehealth (HOSPITAL_COMMUNITY): Payer: Self-pay

## 2019-12-06 NOTE — Telephone Encounter (Signed)
No response from pt regarding CR.  Closed referral.  

## 2019-12-14 ENCOUNTER — Other Ambulatory Visit: Payer: Self-pay | Admitting: Physician Assistant

## 2019-12-17 ENCOUNTER — Encounter (HOSPITAL_COMMUNITY)
Admission: RE | Admit: 2019-12-17 | Discharge: 2019-12-17 | Disposition: A | Discharge status: Home / Self Care | Payer: Medicare Other | Source: Ambulatory Visit | Attending: Cardiovascular Disease | Admitting: Cardiovascular Disease

## 2019-12-17 ENCOUNTER — Telehealth (HOSPITAL_COMMUNITY): Payer: Self-pay

## 2019-12-17 ENCOUNTER — Other Ambulatory Visit: Payer: Self-pay

## 2019-12-17 NOTE — Progress Notes (Signed)
Exercise plan entered in virtual cardiac rehab app for patient. Exercise packet including stretching exercises and exercise bands mailed to patient.  Artist Pais, MS, ACSM CEP 12/17/2019 1321

## 2019-12-17 NOTE — Telephone Encounter (Signed)
Confirm Consent - In the setting of the current Covid19 crisis, you are scheduled for a phone visit with your Cardiac or Pulmonary team member.  Just as we do with many in-gym visits, in order for you to participate in this visit, we must obtain consent.  If you'd like, I can send this to your mychart (if signed up) or email for you to review.  Otherwise, I can obtain your verbal consent now.  By agreeing to a telephone visit, we'd like you to understand that the technology does not allow for your Cardiac or Pulmonary Rehab team member to perform a physical assessment, and thus may limit their ability to fully assess your ability to perform exercise programs. If your provider identifies any concerns that need to be evaluated in person, we will make arrangements to do so.  Finally, though the technology is pretty good, we cannot assure that it will always work on either your or our end and we cannot ensure that we have a secure connection.  Cardiac and Pulmonary Rehab Telehealth visits and "At Home" cardiac and pulmonary rehab are provided at no cost to you.        Are you willing to proceed?"        STAFF: Did the patient verbally acknowledge consent to telehealth visit? Document YES/NO here: Yes     Tempie Donning. Support Rep II  Cardiac and Pulmonary Rehab Staff        Date 12/14/2019    @ Time 11:05am

## 2020-01-03 ENCOUNTER — Encounter (HOSPITAL_COMMUNITY)
Admission: RE | Admit: 2020-01-03 | Discharge: 2020-01-03 | Disposition: A | Discharge status: Home / Self Care | Payer: Medicare Other | Source: Ambulatory Visit | Attending: Cardiovascular Disease | Admitting: Cardiovascular Disease

## 2020-01-03 NOTE — Progress Notes (Signed)
Cardiac Rehab Virtual Visit  Spoke with patient today regarding progress with exercise at home. Patient is walking outside when it's not cold or raining without any issues. Patient hasn't seen drastic improvement in his energy level, but also feels that he could walk more. Patient is currently walking 20-25 minutes from the end of his street to the road, which he thinks is about a mile. We discussed increasing his exercise duration since it feels light to him from 1 round trip to 1.5, and patient is amenable to this. Patient has the packet of stretches and resistance exercises with an exercise band but not doing those exercises consistently. Patient will try to incorporate stretching more into his routine and we discussed omitting the stretches that pull across the chest. Patient verbalizes understanding of information given. Patient doesn't have any questions/concerns at this time. Will follow-up next week.  Artist Pais, MS, ACSM [employer] 01/03/2020 [phone]

## 2020-01-05 ENCOUNTER — Other Ambulatory Visit: Payer: Self-pay

## 2020-01-05 ENCOUNTER — Telehealth (HOSPITAL_COMMUNITY): Payer: Self-pay | Admitting: *Deleted

## 2020-01-05 ENCOUNTER — Encounter (HOSPITAL_COMMUNITY)
Admission: RE | Admit: 2020-01-05 | Discharge: 2020-01-05 | Disposition: A | Discharge status: Home / Self Care | Payer: Medicare Other | Source: Ambulatory Visit | Attending: Cardiovascular Disease | Admitting: Cardiovascular Disease

## 2020-01-05 NOTE — Telephone Encounter (Signed)
Spoke to Mr Gleed. Completed health history for tomorrows orientation. Reviewed medications. Cardiac Rehab Medication Review by a Nurse  Does the patient  feel that his/her medications are working for him/her?  yes  Has the patient been experiencing any side effects to the medications prescribed?  no  Does the patient measure his/her own blood pressure or blood glucose at home?  yes   Does the patient have any problems obtaining medications due to transportation or finances?   no  Understanding of regimen: good Understanding of indications: good Potential of compliance: good    Nurse comments: Mr Dorsey is taking his medications as prescribed and is has no concerns or issues.    Arta Bruce Sharalee Witman RN 01/05/2020 3:51 PM

## 2020-01-05 NOTE — Telephone Encounter (Signed)
Contacted patient regarding cardiac rehab referral. No answer, left message on voicemail. Artist Pais, MS, ACSM CEP

## 2020-01-06 ENCOUNTER — Encounter (HOSPITAL_COMMUNITY)
Admission: RE | Admit: 2020-01-06 | Discharge: 2020-01-06 | Disposition: A | Discharge status: Home / Self Care | Payer: Medicare Other | Source: Ambulatory Visit | Attending: Cardiovascular Disease | Admitting: Cardiovascular Disease

## 2020-01-06 ENCOUNTER — Encounter (HOSPITAL_COMMUNITY): Payer: Self-pay

## 2020-01-06 ENCOUNTER — Other Ambulatory Visit: Payer: Self-pay

## 2020-01-06 VITALS — BP 112/68 | HR 76 | Temp 97.0°F | Ht 67.25 in | Wt 201.1 lb

## 2020-01-06 DIAGNOSIS — Z79899 Other long term (current) drug therapy: Secondary | ICD-10-CM | POA: Insufficient documentation

## 2020-01-06 DIAGNOSIS — Z7982 Long term (current) use of aspirin: Secondary | ICD-10-CM | POA: Diagnosis not present

## 2020-01-06 DIAGNOSIS — E7800 Pure hypercholesterolemia, unspecified: Secondary | ICD-10-CM | POA: Insufficient documentation

## 2020-01-06 DIAGNOSIS — E119 Type 2 diabetes mellitus without complications: Secondary | ICD-10-CM | POA: Diagnosis not present

## 2020-01-06 DIAGNOSIS — Z951 Presence of aortocoronary bypass graft: Secondary | ICD-10-CM

## 2020-01-06 DIAGNOSIS — Z87891 Personal history of nicotine dependence: Secondary | ICD-10-CM | POA: Insufficient documentation

## 2020-01-06 DIAGNOSIS — Z794 Long term (current) use of insulin: Secondary | ICD-10-CM | POA: Insufficient documentation

## 2020-01-06 DIAGNOSIS — I2510 Atherosclerotic heart disease of native coronary artery without angina pectoris: Secondary | ICD-10-CM | POA: Diagnosis not present

## 2020-01-06 HISTORY — DX: Atherosclerotic heart disease of native coronary artery without angina pectoris: I25.10

## 2020-01-06 LAB — GLUCOSE, CAPILLARY: Glucose-Capillary: 276 mg/dL — ABNORMAL HIGH (ref 70–99)

## 2020-01-06 NOTE — Progress Notes (Signed)
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  aspirin EC 325 MG EC tablet, Take 1 tablet (325 mg total) by mouth daily., Disp: 30 tablet, Rfl: 0 .  B-D UF III MINI PEN NEEDLES 31G X 5 MM MISC, USE TO CHECK BLOOD SUGARS ONCE A DAY, Disp: , Rfl:  .  clotrimazole (LOTRIMIN) 1 % cream, Apply 1 application topically as needed (Groin)., Disp: , Rfl:  .  glimepiride (AMARYL) 4 MG tablet, Take 4 mg by mouth 2 (two) times daily. , Disp: , Rfl:  .  Insulin Glargine, 1 Unit Dial, (TOUJEO SOLOSTAR) 300 UNIT/ML SOPN, Inject 60 mg into the skin daily. , Disp: , Rfl:  .  metFORMIN (GLUCOPHAGE) 1000 MG tablet, Take 1,000 mg by mouth 2 (two) times daily with a meal. , Disp: , Rfl:  .  metoprolol tartrate (LOPRESSOR) 25 MG tablet, Take 0.5 tablets (12.5 mg total) by mouth 2 (two) times daily., Disp: 30 tablet, Rfl: 3 .  Multiple Vitamins-Minerals (MULTIVITAMIN WITH MINERALS) tablet, Take 1 tablet by mouth daily., Disp: , Rfl:  .  simvastatin (ZOCOR) 40 MG tablet, Take 40 mg by mouth daily at 6 PM. , Disp: , Rfl:  .  solifenacin (VESICARE) 5 MG tablet, Take 5 mg by mouth daily. , Disp: , Rfl:   Past Medical History: Past Medical History:  Diagnosis Date  . Coronary artery disease   . Diabetes mellitus without complication (HCC)   . High cholesterol   . Vertigo     Tobacco Use: Social History   Tobacco Use  Smoking Status Former Smoker  . Years: 10.00  . Types: Cigarettes  . Quit date: 12/1962  . Years  since quitting: 57.1  Smokeless Tobacco Never Used    Labs: Recent Hydrographic surveyor    Labs for ITP Cardiac and Pulmonary Rehab Latest Ref Rng & Units 10/12/2019 10/12/2019 10/13/2019 10/13/2019 10/14/2019   Cholestrol 0 - 200 mg/dL - - - - -   LDLCALC 0 - 99 mg/dL - - - - -   HDL >50 mg/dL - - - - -   Trlycerides <150 mg/dL - - - - -   Hemoglobin A1c 4.8 - 5.6 % - - - - -   PHART 7.350 - 7.450 7.325(L) 7.413 7.450 - -   PCO2ART 32.0 - 48.0 mmHg 38.6 34.3 32.5 - -   HCO3 20.0 - 28.0 mmol/L 20.1 21.9 22.5 - -   TCO2 22 - 32 mmol/L 21(L) 23 24 - -   ACIDBASEDEF 0.0 - 2.0 mmol/L 5.0(H) 2.0 1.0 - -   O2SAT % 96.0 91.0 95.0 64.4 64.2      Capillary Blood Glucose: Lab Results  Component Value Date   GLUCAP 276 (H) 01/06/2020   GLUCAP 163 (H) 10/20/2019   GLUCAP 167 (H) 10/19/2019   GLUCAP 130 (H) 10/19/2019   GLUCAP 242 (H) 10/19/2019     Exercise Target Goals: Exercise Program Goal: Individual exercise prescription set using results from initial  6 min walk test and THRR while considering  patient's activity barriers and safety.   Exercise Prescription Goal: Starting with aerobic activity 30 plus minutes a day, 3 days per week for initial exercise prescription. Provide home exercise prescription and guidelines that participant acknowledges understanding prior to discharge.  Activity Barriers & Risk Stratification: Activity Barriers & Cardiac Risk Stratification - 01/06/20 0930      Activity Barriers & Cardiac Risk Stratification   Activity Barriers  None    Cardiac Risk Stratification  High       6 Minute Walk: 6 Minute Walk    Row Name 01/06/20 0942         6 Minute Walk   Phase  Initial     Distance  1515 feet     Walk Time  6 minutes     # of Rest Breaks  0     MPH  2.87     METS  2.43     RPE  11     Perceived Dyspnea   0     VO2 Peak  8.5     Symptoms  No     Resting HR  76 bpm     Resting BP  112/68     Resting Oxygen Saturation   97 %     Exercise  Oxygen Saturation  during 6 min walk  97 %     Max Ex. HR  100 bpm     Max Ex. BP  112/58     2 Minute Post BP  108/58        Oxygen Initial Assessment:   Oxygen Re-Evaluation:   Oxygen Discharge (Final Oxygen Re-Evaluation):   Initial Exercise Prescription: Initial Exercise Prescription - 01/06/20 1000      Date of Initial Exercise RX and Referring Provider   Date  01/06/20    Referring Provider  Nanetta Batty, MD      Track   Minutes  30      Prescription Details   Frequency (times per week)  5-7    Duration  Progress to 30 minutes of continuous aerobic without signs/symptoms of physical distress      Intensity   THRR 40-80% of Max Heartrate  56-113    Ratings of Perceived Exertion  11-13    Perceived Dyspnea  0-4      Progression   Progression  Continue to progress workloads to maintain intensity without signs/symptoms of physical distress.      Resistance Training   Training Prescription  Yes    Weight  --   orange bands   Reps  10-15       Perform Capillary Blood Glucose checks as needed.  Exercise Prescription Changes:   Exercise Comments:   Exercise Goals and Review: Exercise Goals    Row Name 01/06/20 0933             Exercise Goals   Increase Physical Activity  Yes       Intervention  Provide advice, education, support and counseling about physical activity/exercise needs.;Develop an individualized exercise prescription for aerobic and resistive training based on initial evaluation findings, risk stratification, comorbidities and participant's personal goals.       Expected Outcomes  Short Term: Attend rehab on a regular basis to increase amount of physical activity.;Long Term: Exercising regularly at least 3-5 days a week.;Long Term: Add in home exercise to make exercise part of routine and to increase amount of physical activity.  Increase Strength and Stamina  Yes       Intervention  Provide advice, education, support and  counseling about physical activity/exercise needs.;Develop an individualized exercise prescription for aerobic and resistive training based on initial evaluation findings, risk stratification, comorbidities and participant's personal goals.       Expected Outcomes  Short Term: Increase workloads from initial exercise prescription for resistance, speed, and METs.;Short Term: Perform resistance training exercises routinely during rehab and add in resistance training at home;Long Term: Improve cardiorespiratory fitness, muscular endurance and strength as measured by increased METs and functional capacity (6MWT)       Able to understand and use rate of perceived exertion (RPE) scale  Yes       Intervention  Provide education and explanation on how to use RPE scale       Expected Outcomes  Short Term: Able to use RPE daily in rehab to express subjective intensity level;Long Term:  Able to use RPE to guide intensity level when exercising independently       Knowledge and understanding of Target Heart Rate Range (THRR)  Yes       Intervention  Provide education and explanation of THRR including how the numbers were predicted and where they are located for reference       Expected Outcomes  Short Term: Able to state/look up THRR;Long Term: Able to use THRR to govern intensity when exercising independently;Short Term: Able to use daily as guideline for intensity in rehab       Able to check pulse independently  Yes       Intervention  Provide education and demonstration on how to check pulse in carotid and radial arteries.;Review the importance of being able to check your own pulse for safety during independent exercise       Expected Outcomes  Short Term: Able to explain why pulse checking is important during independent exercise;Long Term: Able to check pulse independently and accurately       Understanding of Exercise Prescription  Yes       Intervention  Provide education, explanation, and written materials  on patient's individual exercise prescription       Expected Outcomes  Short Term: Able to explain program exercise prescription;Long Term: Able to explain home exercise prescription to exercise independently          Exercise Goals Re-Evaluation :    Discharge Exercise Prescription (Final Exercise Prescription Changes):   Nutrition:  Target Goals: Understanding of nutrition guidelines, daily intake of sodium 1500mg , cholesterol 200mg , calories 30% from fat and 7% or less from saturated fats, daily to have 5 or more servings of fruits and vegetables.  Biometrics: Pre Biometrics - 01/06/20 0925      Pre Biometrics   Height  5' 7.25" (1.708 m)    Weight  91.2 kg    Waist Circumference  42 inches    Hip Circumference  43.75 inches    Waist to Hip Ratio  0.96 %    BMI (Calculated)  31.26    Triceps Skinfold  7 mm    % Body Fat  27.2 %    Grip Strength  27.5 kg    Flexibility  14 in    Single Leg Stand  5.12 seconds        Nutrition Therapy Plan and Nutrition Goals:   Nutrition Assessments:   Nutrition Goals Re-Evaluation:   Nutrition Goals Discharge (Final Nutrition Goals Re-Evaluation):   Psychosocial: Target Goals: Acknowledge presence or absence of  significant depression and/or stress, maximize coping skills, provide positive support system. Participant is able to verbalize types and ability to use techniques and skills needed for reducing stress and depression.  Initial Review & Psychosocial Screening: Initial Psych Review & Screening - 01/06/20 0952      Initial Review   Current issues with  None Identified      Family Dynamics   Good Support System?  Yes   Varian has his wife for support     Barriers   Psychosocial barriers to participate in program  There are no identifiable barriers or psychosocial needs.      Screening Interventions   Interventions  Encouraged to exercise       Quality of Life Scores: Quality of Life - 01/06/20 1022       Quality of Life   Select  Quality of Life      Quality of Life Scores   Health/Function Pre  25.93 %    Socioeconomic Pre  28.19 %    Psych/Spiritual Pre  28.57 %    Family Pre  26.4 %    GLOBAL Pre  27.04 %      Scores of 19 and below usually indicate a poorer quality of life in these areas.  A difference of  2-3 points is a clinically meaningful difference.  A difference of 2-3 points in the total score of the Quality of Life Index has been associated with significant improvement in overall quality of life, self-image, physical symptoms, and general health in studies assessing change in quality of life.  PHQ-9: Recent Review Flowsheet Data    Depression screen [facility] 2/9 01/06/2020   Decreased Interest 0   Down, Depressed, Hopeless 0   PHQ - 2 Score 0     Interpretation of Total Score  Total Score Depression Severity:  1-4 = Minimal depression, 5-9 = Mild depression, 10-14 = Moderate depression, 15-19 = Moderately severe depression, 20-27 = Severe depression   Psychosocial Evaluation and Intervention:   Psychosocial Re-Evaluation:   Psychosocial Discharge (Final Psychosocial Re-Evaluation):   Vocational Rehabilitation: Provide vocational rehab assistance to qualifying candidates.   Vocational Rehab Evaluation & Intervention: Vocational Rehab - 01/06/20 0953      Initial Vocational Rehab Evaluation & Intervention   Assessment shows need for Vocational Rehabilitation  No   Stepfon is retired and does not need vocational rehab at this time      Education: Education Goals: Education classes will be provided on a weekly basis, covering required topics. Participant will state understanding/return demonstration of topics presented.  Learning Barriers/Preferences: Learning Barriers/Preferences - 01/06/20 1155      Learning Barriers/Preferences   Learning Barriers  Sight;Hearing   Wears reading glasses. Deaf in right ear.   Learning Preferences  Skilled Demonstration        Education Topics: Hypertension, Hypertension Reduction -Define heart disease and high blood pressure. Discus how high blood pressure affects the body and ways to reduce high blood pressure.   Exercise and Your Heart -Discuss why it is important to exercise, the FITT principles of exercise, normal and abnormal responses to exercise, and how to exercise safely.   Angina -Discuss definition of angina, causes of angina, treatment of angina, and how to decrease risk of having angina.   Cardiac Medications -Review what the following cardiac medications are used for, how they affect the body, and side effects that may occur when taking the medications.  Medications include Aspirin, Beta blockers, calcium channel blockers, ACE Inhibitors,  angiotensin receptor blockers, diuretics, digoxin, and antihyperlipidemics.   Congestive Heart Failure -Discuss the definition of CHF, how to live with CHF, the signs and symptoms of CHF, and how keep track of weight and sodium intake.   Heart Disease and Intimacy -Discus the effect sexual activity has on the heart, how changes occur during intimacy as we age, and safety during sexual activity.   Smoking Cessation / COPD -Discuss different methods to quit smoking, the health benefits of quitting smoking, and the definition of COPD.   Nutrition I: Fats -Discuss the types of cholesterol, what cholesterol does to the heart, and how cholesterol levels can be controlled.   Nutrition II: Labels -Discuss the different components of food labels and how to read food label   Heart Parts/Heart Disease and PAD -Discuss the anatomy of the heart, the pathway of blood circulation through the heart, and these are affected by heart disease.   Stress I: Signs and Symptoms -Discuss the causes of stress, how stress may lead to anxiety and depression, and ways to limit stress.   Stress II: Relaxation -Discuss different types of relaxation techniques to limit  stress.   Warning Signs of Stroke / TIA -Discuss definition of a stroke, what the signs and symptoms are of a stroke, and how to identify when someone is having stroke.   Knowledge Questionnaire Score: Knowledge Questionnaire Score - 01/06/20 1026      Knowledge Questionnaire Score   Pre Score  20/24       Core Components/Risk Factors/Patient Goals at Admission: Personal Goals and Risk Factors at Admission - 01/06/20 1113      Core Components/Risk Factors/Patient Goals on Admission    Weight Management  Yes;Obesity    Intervention  Weight Management/Obesity: Establish reasonable short term and long term weight goals.;Obesity: Provide education and appropriate resources to help participant work on and attain dietary goals.    Admit Weight  201 lb 1 oz (91.2 kg)    Expected Outcomes  Short Term: Continue to assess and modify interventions until short term weight is achieved;Long Term: Adherence to nutrition and physical activity/exercise program aimed toward attainment of established weight goal;Weight Loss: Understanding of general recommendations for a balanced deficit meal plan, which promotes 1-2 lb weight loss per week and includes a negative energy balance of [phone] kcal/d    Diabetes  Yes    Intervention  Provide education about signs/symptoms and action to take for hypo/hyperglycemia.;Provide education about proper nutrition, including hydration, and aerobic/resistive exercise prescription along with prescribed medications to achieve blood glucose in normal ranges: Fasting glucose 65-99 mg/dL    Expected Outcomes  Short Term: Participant verbalizes understanding of the signs/symptoms and immediate care of hyper/hypoglycemia, proper foot care and importance of medication, aerobic/resistive exercise and nutrition plan for blood glucose control.;Long Term: Attainment of HbA1C < 7%.    Lipids  Yes    Intervention  Provide education and support for participant on nutrition &  aerobic/resistive exercise along with prescribed medications to achieve LDL 70mg , HDL >40mg .    Expected Outcomes  Short Term: Participant states understanding of desired cholesterol values and is compliant with medications prescribed. Participant is following exercise prescription and nutrition guidelines.;Long Term: Cholesterol controlled with medications as prescribed, with individualized exercise RX and with personalized nutrition plan. Value goals: LDL < 70mg , HDL > 40 mg.       Core Components/Risk Factors/Patient Goals Review:    Core Components/Risk Factors/Patient Goals at Discharge (Final Review):    ITP Comments: ITP  Comments    Row Name 01/06/20 0919           ITP Comments  Medical Director- Dr. Armanda Magic, MD          Comments:Bao attended orientation on 01/06/2020 to review rules and guidelines for program.  Completed 6 minute walk test, Intitial ITP, and exercise prescription.  VSS. Telemetry-Sinus Rhythm.  Asymptomatic. Safety measures and social distancing in place per CDC guidelines.Appontment made for Jarius to meet with the dietitian in person next week.Gladstone Lighter, RN,BSN 01/06/2020 12:00 PM

## 2020-01-12 ENCOUNTER — Encounter (HOSPITAL_COMMUNITY)
Admission: RE | Admit: 2020-01-12 | Discharge: 2020-01-12 | Disposition: A | Discharge status: Home / Self Care | Payer: Medicare Other | Source: Ambulatory Visit | Attending: Cardiovascular Disease | Admitting: Cardiovascular Disease

## 2020-01-12 ENCOUNTER — Telehealth: Payer: Self-pay | Admitting: Cardiothoracic Surgery

## 2020-01-12 ENCOUNTER — Other Ambulatory Visit: Payer: Self-pay

## 2020-01-12 DIAGNOSIS — Z79899 Other long term (current) drug therapy: Secondary | ICD-10-CM | POA: Insufficient documentation

## 2020-01-12 DIAGNOSIS — Z951 Presence of aortocoronary bypass graft: Secondary | ICD-10-CM | POA: Insufficient documentation

## 2020-01-12 DIAGNOSIS — E119 Type 2 diabetes mellitus without complications: Secondary | ICD-10-CM | POA: Insufficient documentation

## 2020-01-12 DIAGNOSIS — I2510 Atherosclerotic heart disease of native coronary artery without angina pectoris: Secondary | ICD-10-CM | POA: Insufficient documentation

## 2020-01-12 DIAGNOSIS — Z794 Long term (current) use of insulin: Secondary | ICD-10-CM | POA: Insufficient documentation

## 2020-01-12 DIAGNOSIS — E7800 Pure hypercholesterolemia, unspecified: Secondary | ICD-10-CM | POA: Insufficient documentation

## 2020-01-12 DIAGNOSIS — Z7901 Long term (current) use of anticoagulants: Secondary | ICD-10-CM | POA: Insufficient documentation

## 2020-01-12 DIAGNOSIS — Z7982 Long term (current) use of aspirin: Secondary | ICD-10-CM | POA: Insufficient documentation

## 2020-01-12 DIAGNOSIS — Z87891 Personal history of nicotine dependence: Secondary | ICD-10-CM | POA: Insufficient documentation

## 2020-01-12 NOTE — Telephone Encounter (Signed)
      [address]       [place] [address]             [phone]     CARDIOTHORACIC SURGERY TELEPHONE VIRTUAL OFFICE NOTE  Referring Provider is No ref. provider found Primary Cardiologist is No primary care provider on file. PCP is [name], MD   HPI:  I spoke with Fred [name] (DOB [birth year] ) via telephone on 01/12/2020 at 10:08 AM and verified that I was speaking with the correct person using more than one form of identification.  We discussed the reason(s) for conducting our visit virtually instead of in-person.  The patient expressed understanding the circumstances and agreed to proceed as described.  Doing well 3 months postop CABGx4 No angina or symptoms of CHF Incisions clean Wt stable w/o edema He is now able to proceed without restriction on activities H e checks BP regularly  Current Outpatient Medications  Medication Sig Dispense Refill  . aspirin EC 325 MG EC tablet Take 1 tablet (325 mg total) by mouth daily. 30 tablet 0  . B-D UF III MINI PEN NEEDLES 31G X 5 MM MISC USE TO CHECK BLOOD SUGARS ONCE A DAY    . clotrimazole (LOTRIMIN) 1 % cream Apply 1 application topically as needed (Groin).    Marland Kitchen glimepiride (AMARYL) 4 MG tablet Take 4 mg by mouth 2 (two) times daily.     . Insulin Glargine, 1 Unit Dial, (TOUJEO SOLOSTAR) 300 UNIT/ML SOPN Inject 60 mg into the skin daily.     . metFORMIN (GLUCOPHAGE) 1000 MG tablet Take 1,000 mg by mouth 2 (two) times daily with a meal.     . metoprolol tartrate (LOPRESSOR) 25 MG tablet Take 0.5 tablets (12.5 mg total) by mouth 2 (two) times daily. 30 tablet 3  . Multiple Vitamins-Minerals (MULTIVITAMIN WITH MINERALS) tablet Take 1 tablet by mouth daily.    . simvastatin (ZOCOR) 40 MG tablet Take 40 mg by mouth daily at 6 PM.     . solifenacin (VESICARE) 5 MG tablet Take 5 mg by mouth daily.      No current facility-administered medications for this visit.     Diagnostic Tests:  Last CXR for postop  visit clear   Impression: Good recovery after CABGx4   Plan:   Patient understands the components of heart healthy lifestyle - diet and activity Will be followed by primary MD and cardiology    I discussed limitations of evaluation and management via telephone.  The patient was advised to call back for repeat telephone consultation or to seek an in-person evaluation if questions arise or the patient's clinical condition changes in any significant manner.  I spent in excess of 6 minutes of non-face-to-face time during the conduct of this telephone virtual office consultation.  Level 1  (99441)             5-10 minutes Level 2  (99442)            11-20 minutes Level 3  (99443)            21-30 minutes    01/12/2020 10:08 AM

## 2020-01-12 NOTE — Progress Notes (Signed)
Fred [name] 79 y.o. 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 Coronary artery disease   . Diabetes mellitus without complication ([place])   . High cholesterol   . Vertigo      Medications reviewed.   Current Outpatient Medications:  .  aspirin EC 325 MG EC tablet, Take 1 tablet (325 mg total) by mouth daily., Disp: 30 tablet, Rfl: 0 .  B-D UF III MINI PEN NEEDLES 31G X 5 MM MISC, USE TO CHECK BLOOD SUGARS ONCE A DAY, Disp: , Rfl:  .  clotrimazole (LOTRIMIN) 1 % cream, Apply 1 application topically as needed (Groin)., Disp: , Rfl:  .  glimepiride (AMARYL) 4 MG tablet, Take 4 mg by mouth 2 (two) times daily. , Disp: , Rfl:  .  Insulin Glargine, 1 Unit Dial, (TOUJEO SOLOSTAR) 300 UNIT/ML SOPN, Inject 60 mg into the skin daily. , Disp: , Rfl:  .  metFORMIN (GLUCOPHAGE) 1000 MG tablet, Take 1,000 mg by mouth 2 (two) times daily with a meal. , Disp: , Rfl:  .  metoprolol tartrate (LOPRESSOR) 25 MG tablet, Take 0.5 tablets (12.5 mg total) by mouth 2 (two) times daily., Disp: 30 tablet, Rfl: 3 .  Multiple Vitamins-Minerals (MULTIVITAMIN WITH MINERALS) tablet, Take 1 tablet by mouth daily., Disp: , Rfl:  .  simvastatin (ZOCOR) 40 MG tablet, Take 40 mg by mouth daily at 6 PM. , Disp: , Rfl:  .  solifenacin (VESICARE) 5 MG tablet, Take 5 mg by mouth daily. , Disp: , Rfl:    Ht Readings from Last 1 Encounters:  01/06/20 5' 7.25" (1.708 m)     Wt Readings from Last 3 Encounters:  01/06/20 201 lb 1 oz (91.2 kg)  11/17/19 191 lb 6.4 oz (86.8 kg)  11/01/19 201 lb 3.2 oz (91.3 kg)     There is no height or weight on file to calculate BMI.   Social History   Tobacco Use  Smoking Status Former Smoker  . Years: 10.00  . Types: Cigarettes  . Quit date: 12/1962  . Years since quitting: 57.1  Smokeless Tobacco Never Used     Lab Results  Component Value Date   CHOL  10/24/2010    117        ATP III  CLASSIFICATION:  <200     mg/dL   Desirable  200-239  mg/dL   Borderline High  >=240    mg/dL   High          Lab Results  Component Value Date   HDL 31 (L) 10/24/2010   Lab Results  Component Value Date   [facility]  10/24/2010    68        Total Cholesterol/HDL:CHD Risk Coronary Heart Disease Risk Table                     Men   Women  1/2 Average Risk   3.4   3.3  Average Risk       5.0   4.4  2 X Average Risk   9.6   7.1  3 X Average Risk  23.4   11.0        Use the calculated Patient Ratio above and the CHD Risk Table to determine the patient's CHD Risk.        ATP III CLASSIFICATION (LDL):  <100     mg/dL   Optimal  100-129  mg/dL   Near or Above                    Optimal  130-159  mg/dL   Borderline  676-195  mg/dL   High  >093     mg/dL   Very High   Lab Results  Component Value Date   TRIG 92 10/24/2010   Lab Results  Component Value Date   CHOLHDL 3.8 10/24/2010     Lab Results  Component Value Date   HGBA1C 9.0 (H) 10/11/2019     CBG (last 3)  No results for input(s): GLUCAP in the last 72 hours.   Nutrition Note  Spoke with pt. Nutrition Plan and Nutrition Survey goals reviewed with pt.  Pt has Type 2 Diabetes. Last A1c indicates blood glucose not well-controlled. Pt checks CBG's 1 times a day. Fasting CBG's reportedly 80-160 mg/dL. He has never taken CBGs postprandial. His goal is to check 2 hour post prandial CBG (after breakfast) with a  goal of 180mg /dl. We estimated he is getting close to 100 g carbs at his breakfast (2 servings granola (80 g), 1 c milk (15 g), 4-6 oz orange juice (15 g). He will start measuring cereal to have about 60-75 g carbs per meal and continue checking post prandial CBG in morning. He has good understanding of label reading, carbs affecting glucose, and how to count carbs. He has done this years ago when he was diagnosed with diabetes.  Pt expressed understanding of the information reviewed.   Nutrition  Diagnosis ? Food-and nutrition-related knowledge deficit related to lack of exposure to information as related to diagnosis of: ? CVD ? Type 2 Diabetes  Nutrition Intervention ? Pt's individual nutrition plan reviewed with pt. ? Benefits of adopting Heart Healthy diet discussed when Medficts reviewed.   ? Pt given handouts for: ? Nutrition I class ? Nutrition II class ? ? Continue client-centered nutrition education by RD, as part of interdisciplinary care.  Goal(s) ? CBG concentrations in the normal range or as close to normal as is safely possible. ? Improved blood glucose control as evidenced by pt's A1c trending from 9.0 toward less than 7.0. ? Pt able to name foods that affect blood glucose  Plan:   Will provide client-centered nutrition education as part of interdisciplinary care  Monitor and evaluate progress toward nutrition goal with team.   Andrey Campanile, MS, RDN, LDN

## 2020-01-17 ENCOUNTER — Encounter (HOSPITAL_COMMUNITY): Payer: Self-pay | Admitting: *Deleted

## 2020-01-17 DIAGNOSIS — Z951 Presence of aortocoronary bypass graft: Secondary | ICD-10-CM

## 2020-01-17 NOTE — Progress Notes (Signed)
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  aspirin EC 325 MG EC tablet, Take 1 tablet (325 mg total) by mouth daily., Disp: 30 tablet, Rfl: 0 .  B-D UF III MINI PEN NEEDLES 31G X 5 MM MISC, USE TO CHECK BLOOD SUGARS ONCE A DAY, Disp: , Rfl:  .  clotrimazole (LOTRIMIN) 1 % cream, Apply 1 application topically as needed (Groin)., Disp: , Rfl:  .  glimepiride (AMARYL) 4 MG tablet, Take 4 mg by mouth 2 (two) times daily. , Disp: , Rfl:  .  Insulin Glargine, 1 Unit Dial, (TOUJEO SOLOSTAR) 300 UNIT/ML SOPN, Inject 60 mg into the skin daily. , Disp: , Rfl:  .  metFORMIN (GLUCOPHAGE) 1000 MG tablet, Take 1,000 mg by mouth 2 (two) times daily with a meal. , Disp: , Rfl:  .  metoprolol tartrate (LOPRESSOR) 25 MG tablet, Take 0.5 tablets (12.5 mg total) by mouth 2 (two) times daily., Disp: 30 tablet, Rfl: 3 .  Multiple Vitamins-Minerals (MULTIVITAMIN WITH MINERALS) tablet, Take 1 tablet by mouth daily., Disp: , Rfl:  .  simvastatin (ZOCOR) 40 MG tablet, Take 40 mg by mouth daily at 6 PM. , Disp: , Rfl:  .  solifenacin (VESICARE) 5 MG tablet, Take 5 mg by mouth daily. , Disp: , Rfl:   Past Medical History: Past Medical History:  Diagnosis Date  . Coronary artery disease   . Diabetes mellitus without complication (HCC)   . High cholesterol   . Vertigo     Tobacco Use: Social History   Tobacco Use  Smoking Status Former Smoker  . Years: 10.00  . Types: Cigarettes  . Quit date: 12/1962  . Years  since quitting: 57.1  Smokeless Tobacco Never Used    Labs: Recent Hydrographic surveyor    Labs for ITP Cardiac and Pulmonary Rehab Latest Ref Rng & Units 10/12/2019 10/12/2019 10/13/2019 10/13/2019 10/14/2019   Cholestrol 0 - 200 mg/dL - - - - -   LDLCALC 0 - 99 mg/dL - - - - -   HDL >50 mg/dL - - - - -   Trlycerides <150 mg/dL - - - - -   Hemoglobin A1c 4.8 - 5.6 % - - - - -   PHART 7.350 - 7.450 7.325(L) 7.413 7.450 - -   PCO2ART 32.0 - 48.0 mmHg 38.6 34.3 32.5 - -   HCO3 20.0 - 28.0 mmol/L 20.1 21.9 22.5 - -   TCO2 22 - 32 mmol/L 21(L) 23 24 - -   ACIDBASEDEF 0.0 - 2.0 mmol/L 5.0(H) 2.0 1.0 - -   O2SAT % 96.0 91.0 95.0 64.4 64.2      Capillary Blood Glucose: Lab Results  Component Value Date   GLUCAP 276 (H) 01/06/2020   GLUCAP 163 (H) 10/20/2019   GLUCAP 167 (H) 10/19/2019   GLUCAP 130 (H) 10/19/2019   GLUCAP 242 (H) 10/19/2019     Exercise Target Goals: Exercise Program Goal: Individual exercise prescription set using results from initial  6 min walk test and THRR while considering  patient's activity barriers and safety.   Exercise Prescription Goal: Starting with aerobic activity 30 plus minutes a day, 3 days per week for initial exercise prescription. Provide home exercise prescription and guidelines that participant acknowledges understanding prior to discharge.  Activity Barriers & Risk Stratification: Activity Barriers & Cardiac Risk Stratification - 01/06/20 0930      Activity Barriers & Cardiac Risk Stratification   Activity Barriers  None    Cardiac Risk Stratification  High       6 Minute Walk: 6 Minute Walk    Row Name 01/06/20 0942         6 Minute Walk   Phase  Initial     Distance  1515 feet     Walk Time  6 minutes     # of Rest Breaks  0     MPH  2.87     METS  2.43     RPE  11     Perceived Dyspnea   0     VO2 Peak  8.5     Symptoms  No     Resting HR  76 bpm     Resting BP  112/68     Resting Oxygen Saturation   97 %     Exercise  Oxygen Saturation  during 6 min walk  97 %     Max Ex. HR  100 bpm     Max Ex. BP  112/58     2 Minute Post BP  108/58        Oxygen Initial Assessment:   Oxygen Re-Evaluation:   Oxygen Discharge (Final Oxygen Re-Evaluation):   Initial Exercise Prescription: Initial Exercise Prescription - 01/06/20 1000      Date of Initial Exercise RX and Referring Provider   Date  01/06/20    Referring Provider  Nanetta BattyBerry, Jonathan, MD      Track   Minutes  30      Prescription Details   Frequency (times per week)  5-7    Duration  Progress to 30 minutes of continuous aerobic without signs/symptoms of physical distress      Intensity   THRR 40-80% of Max Heartrate  56-113    Ratings of Perceived Exertion  11-13    Perceived Dyspnea  0-4      Progression   Progression  Continue to progress workloads to maintain intensity without signs/symptoms of physical distress.      Resistance Training   Training Prescription  Yes    Weight  --   orange bands   Reps  10-15       Perform Capillary Blood Glucose checks as needed.  Exercise Prescription Changes:  Exercise Prescription Changes    Row Name 01/17/20 0924             Response to Exercise   Rating of Perceived Exertion (Exercise)  11       Symptoms  none       Comments  Virtual cardiac rehab session.         Progression   Progression  Continue to progress workloads to maintain intensity without signs/symptoms of physical distress.         Track   Minutes  40         Home Exercise Plan   Plans to continue exercise at  Home (comment) Walking       Initial Home Exercises Provided  01/06/20  Exercise Comments:  Exercise Comments    Row Name 01/18/20 [phone]           Exercise Comments  Patient will be contacted to begin participation in the onsite cardiac rehab program after temporary closure due to COVID-19 pandemic.          Exercise Goals and Review:  Exercise Goals    Row Name 01/06/20 0933              Exercise Goals   Increase Physical Activity  Yes       Intervention  Provide advice, education, support and counseling about physical activity/exercise needs.;Develop an individualized exercise prescription for aerobic and resistive training based on initial evaluation findings, risk stratification, comorbidities and participant's personal goals.       Expected Outcomes  Short Term: Attend rehab on a regular basis to increase amount of physical activity.;Long Term: Exercising regularly at least 3-5 days a week.;Long Term: Add in home exercise to make exercise part of routine and to increase amount of physical activity.       Increase Strength and Stamina  Yes       Intervention  Provide advice, education, support and counseling about physical activity/exercise needs.;Develop an individualized exercise prescription for aerobic and resistive training based on initial evaluation findings, risk stratification, comorbidities and participant's personal goals.       Expected Outcomes  Short Term: Increase workloads from initial exercise prescription for resistance, speed, and METs.;Short Term: Perform resistance training exercises routinely during rehab and add in resistance training at home;Long Term: Improve cardiorespiratory fitness, muscular endurance and strength as measured by increased METs and functional capacity (6MWT)       Able to understand and use rate of perceived exertion (RPE) scale  Yes       Intervention  Provide education and explanation on how to use RPE scale       Expected Outcomes  Short Term: Able to use RPE daily in rehab to express subjective intensity level;Long Term:  Able to use RPE to guide intensity level when exercising independently       Knowledge and understanding of Target Heart Rate Range (THRR)  Yes       Intervention  Provide education and explanation of THRR including how the numbers were predicted and where they are located for reference       Expected  Outcomes  Short Term: Able to state/look up THRR;Long Term: Able to use THRR to govern intensity when exercising independently;Short Term: Able to use daily as guideline for intensity in rehab       Able to check pulse independently  Yes       Intervention  Provide education and demonstration on how to check pulse in carotid and radial arteries.;Review the importance of being able to check your own pulse for safety during independent exercise       Expected Outcomes  Short Term: Able to explain why pulse checking is important during independent exercise;Long Term: Able to check pulse independently and accurately       Understanding of Exercise Prescription  Yes       Intervention  Provide education, explanation, and written materials on patient's individual exercise prescription       Expected Outcomes  Short Term: Able to explain program exercise prescription;Long Term: Able to explain home exercise prescription to exercise independently          Exercise Goals Re-Evaluation : Exercise Goals Re-Evaluation    Row Name 01/18/20  0926             Exercise Goal Re-Evaluation   Comments  Patient has been actively participating in the virtual cardiac rehab program via the [facility] app and has been progressing well. Patient is walking 20-40 minutes, 5-6 days/week.       Expected Outcomes  Patient will transition to the onsite cardiac rehab program.           Discharge Exercise Prescription (Final Exercise Prescription Changes): Exercise Prescription Changes - 01/17/20 0924      Response to Exercise   Rating of Perceived Exertion (Exercise)  11    Symptoms  none    Comments  Virtual cardiac rehab session.      Progression   Progression  Continue to progress workloads to maintain intensity without signs/symptoms of physical distress.      Track   Minutes  40      Home Exercise Plan   Plans to continue exercise at  Home (comment)   Walking   Initial Home Exercises Provided   01/06/20       Nutrition:  Target Goals: Understanding of nutrition guidelines, daily intake of sodium 1500mg , cholesterol 200mg , calories 30% from fat and 7% or less from saturated fats, daily to have 5 or more servings of fruits and vegetables.  Biometrics: Pre Biometrics - 01/06/20 0925      Pre Biometrics   Height  5' 7.25" (1.708 m)    Weight  201 lb 1 oz (91.2 kg)    Waist Circumference  42 inches    Hip Circumference  43.75 inches    Waist to Hip Ratio  0.96 %    BMI (Calculated)  31.26    Triceps Skinfold  7 mm    % Body Fat  27.2 %    Grip Strength  27.5 kg    Flexibility  14 in    Single Leg Stand  5.12 seconds        Nutrition Therapy Plan and Nutrition Goals:   Nutrition Assessments:   Nutrition Goals Re-Evaluation:   Nutrition Goals Discharge (Final Nutrition Goals Re-Evaluation):   Psychosocial: Target Goals: Acknowledge presence or absence of significant depression and/or stress, maximize coping skills, provide positive support system. Participant is able to verbalize types and ability to use techniques and skills needed for reducing stress and depression.  Initial Review & Psychosocial Screening: Initial Psych Review & Screening - 01/06/20 0952      Initial Review   Current issues with  None Identified      Family Dynamics   Good Support System?  Yes   Fred [name] has his wife for support     Barriers   Psychosocial barriers to participate in program  There are no identifiable barriers or psychosocial needs.      Screening Interventions   Interventions  Encouraged to exercise       Quality of Life Scores: Quality of Life - 01/06/20 1022      Quality of Life   Select  Quality of Life      Quality of Life Scores   Health/Function Pre  25.93 %    Socioeconomic Pre  28.19 %    Psych/Spiritual Pre  28.57 %    Family Pre  26.4 %    GLOBAL Pre  27.04 %      Scores of 19 and below usually indicate a poorer quality of life in these areas.  A  difference of  2-3 points is  a clinically meaningful difference.  A difference of 2-3 points in the total score of the Quality of Life Index has been associated with significant improvement in overall quality of life, self-image, physical symptoms, and general health in studies assessing change in quality of life.  PHQ-9: Recent Review Flowsheet Data    Depression screen [facility] 2/9 01/06/2020   Decreased Interest 0   Down, Depressed, Hopeless 0   PHQ - 2 Score 0     Interpretation of Total Score  Total Score Depression Severity:  1-4 = Minimal depression, 5-9 = Mild depression, 10-14 = Moderate depression, 15-19 = Moderately severe depression, 20-27 = Severe depression   Psychosocial Evaluation and Intervention:   Psychosocial Re-Evaluation: Psychosocial Re-Evaluation    Row Name 01/17/20 1220             Psychosocial Re-Evaluation   Current issues with  None Identified       Interventions  Encouraged to attend Cardiac Rehabilitation for the exercise       Continue Psychosocial Services   No Follow up required          Psychosocial Discharge (Final Psychosocial Re-Evaluation): Psychosocial Re-Evaluation - 01/17/20 1220      Psychosocial Re-Evaluation   Current issues with  None Identified    Interventions  Encouraged to attend Cardiac Rehabilitation for the exercise    Continue Psychosocial Services   No Follow up required       Vocational Rehabilitation: Provide vocational rehab assistance to qualifying candidates.   Vocational Rehab Evaluation & Intervention: Vocational Rehab - 01/06/20 0953      Initial Vocational Rehab Evaluation & Intervention   Assessment shows need for Vocational Rehabilitation  No   Fred [name] is retired and does not need vocational rehab at this time      Education: Education Goals: Education classes will be provided on a weekly basis, covering required topics. Participant will state understanding/return demonstration of topics  presented.  Learning Barriers/Preferences: Learning Barriers/Preferences - 01/06/20 1155      Learning Barriers/Preferences   Learning Barriers  Sight;Hearing   Wears reading glasses. Deaf in right ear.   Learning Preferences  Skilled Demonstration       Education Topics: Hypertension, Hypertension Reduction -Define heart disease and high blood pressure. Discus how high blood pressure affects the body and ways to reduce high blood pressure.   Exercise and Your Heart -Discuss why it is important to exercise, the FITT principles of exercise, normal and abnormal responses to exercise, and how to exercise safely.   Angina -Discuss definition of angina, causes of angina, treatment of angina, and how to decrease risk of having angina.   Cardiac Medications -Review what the following cardiac medications are used for, how they affect the body, and side effects that may occur when taking the medications.  Medications include Aspirin, Beta blockers, calcium channel blockers, ACE Inhibitors, angiotensin receptor blockers, diuretics, digoxin, and antihyperlipidemics.   Congestive Heart Failure -Discuss the definition of CHF, how to live with CHF, the signs and symptoms of CHF, and how keep track of weight and sodium intake.   Heart Disease and Intimacy -Discus the effect sexual activity has on the heart, how changes occur during intimacy as we age, and safety during sexual activity.   Smoking Cessation / COPD -Discuss different methods to quit smoking, the health benefits of quitting smoking, and the definition of COPD.   Nutrition I: Fats -Discuss the types of cholesterol, what cholesterol does to the heart, and how cholesterol  levels can be controlled.   Nutrition II: Labels -Discuss the different components of food labels and how to read food label   Heart Parts/Heart Disease and PAD -Discuss the anatomy of the heart, the pathway of blood circulation through the heart, and  these are affected by heart disease.   Stress I: Signs and Symptoms -Discuss the causes of stress, how stress may lead to anxiety and depression, and ways to limit stress.   Stress II: Relaxation -Discuss different types of relaxation techniques to limit stress.   Warning Signs of Stroke / TIA -Discuss definition of a stroke, what the signs and symptoms are of a stroke, and how to identify when someone is having stroke.   Knowledge Questionnaire Score: Knowledge Questionnaire Score - 01/06/20 1026      Knowledge Questionnaire Score   Pre Score  20/24       Core Components/Risk Factors/Patient Goals at Admission: Personal Goals and Risk Factors at Admission - 01/06/20 1113      Core Components/Risk Factors/Patient Goals on Admission    Weight Management  Yes;Obesity    Intervention  Weight Management/Obesity: Establish reasonable short term and long term weight goals.;Obesity: Provide education and appropriate resources to help participant work on and attain dietary goals.    Admit Weight  201 lb 1 oz (91.2 kg)    Expected Outcomes  Short Term: Continue to assess and modify interventions until short term weight is achieved;Long Term: Adherence to nutrition and physical activity/exercise program aimed toward attainment of established weight goal;Weight Loss: Understanding of general recommendations for a balanced deficit meal plan, which promotes 1-2 lb weight loss per week and includes a negative energy balance of [phone] kcal/d    Diabetes  Yes    Intervention  Provide education about signs/symptoms and action to take for hypo/hyperglycemia.;Provide education about proper nutrition, including hydration, and aerobic/resistive exercise prescription along with prescribed medications to achieve blood glucose in normal ranges: Fasting glucose 65-99 mg/dL    Expected Outcomes  Short Term: Participant verbalizes understanding of the signs/symptoms and immediate care of hyper/hypoglycemia,  proper foot care and importance of medication, aerobic/resistive exercise and nutrition plan for blood glucose control.;Long Term: Attainment of HbA1C < 7%.    Lipids  Yes    Intervention  Provide education and support for participant on nutrition & aerobic/resistive exercise along with prescribed medications to achieve LDL 70mg , HDL >40mg .    Expected Outcomes  Short Term: Participant states understanding of desired cholesterol values and is compliant with medications prescribed. Participant is following exercise prescription and nutrition guidelines.;Long Term: Cholesterol controlled with medications as prescribed, with individualized exercise RX and with personalized nutrition plan. Value goals: LDL < 70mg , HDL > 40 mg.       Core Components/Risk Factors/Patient Goals Review:  Goals and Risk Factor Review    Row Name 01/17/20 1223             Core Components/Risk Factors/Patient Goals Review   Personal Goals Review  Weight Management/Obesity;Diabetes;Lipids       Review  Fred [name] is doing well with exercise and is documenting his exercise on the virtual cardiac rehab [facility] APP. Fred [name] ahs been documenting his vital signs. Fred [name]'s vital signs       Expected Outcomes  Patient will continue to participate in virtual cardiac rehab for nutrtion, exercise and lifestyle modifications          Core Components/Risk Factors/Patient Goals at Discharge (Final Review):  Goals and Risk Factor Review - 01/17/20  1223      Core Components/Risk Factors/Patient Goals Review   Personal Goals Review  Weight Management/Obesity;Diabetes;Lipids    Review  Fred [name] is doing well with exercise and is documenting his exercise on the virtual cardiac rehab [facility] APP. Fred [name] ahs been documenting his vital signs. Fred [name]'s vital signs    Expected Outcomes  Patient will continue to participate in virtual cardiac rehab for nutrtion, exercise and lifestyle modifications       ITP Comments: ITP Comments     Row Name 01/06/20 0919 01/17/20 1219         ITP Comments  Medical Director- Dr. Armanda Magic, MD  30 Day ITP Review. Akeel is doing well with virtual cardiac rehab via the [facility] APP         Comments: See ITP comments.Fred Lighter, RN,BSN 01/19/2020 2:06 PM

## 2020-01-18 ENCOUNTER — Telehealth (HOSPITAL_COMMUNITY): Payer: Self-pay

## 2020-01-18 NOTE — Telephone Encounter (Signed)
Pt insurance is active and benefits verified through [facility] Medicare Co-pay 0, DED $290/$72.77 met, out of pocket $3,290/$72.77 met, co-insurance 20%. no pre-authorization required. Passport, 01/18/2020'@10'$ :04am, REF# [id number]  Will contact patient to see if he is interested in the Cardiac Rehab Program. If interested, patient will need to complete follow up appt. Once completed, patient will be contacted for scheduling upon review by the RN Navigator.

## 2020-01-19 ENCOUNTER — Encounter (HOSPITAL_COMMUNITY): Payer: Self-pay

## 2020-01-19 NOTE — Progress Notes (Signed)
Phone call to Pt to inquire about interest for in person CR program. Pt stated he was interested and would like to enroll in program. Pt is scheduled to start CR program 01/26/2020. Pt understands COVID 19 protocols.

## 2020-01-25 ENCOUNTER — Other Ambulatory Visit: Payer: Self-pay

## 2020-01-25 ENCOUNTER — Inpatient Hospital Stay (HOSPITAL_COMMUNITY)
Admission: RE | Admit: 2020-01-25 | Discharge: 2020-01-25 | Disposition: A | Discharge status: Home / Self Care | Payer: Medicare Other | Source: Ambulatory Visit

## 2020-01-25 NOTE — Progress Notes (Signed)
Nutrition Note: Virtual Visit  Spoke with Jameson on the phone for virtual cardiac rehab. He has kept a log of fasting and post prandial CBGs.His fasting CBGs are 78-140 mg/dl for the past 2 weeks. Post prandial CBGs 185-283 mg/dl with outlier of 015 mg/dl one day. He has started measuring cereal to determine how many carbs he is eating. He is eating the serving size of granola, 1% milk, and 6 oz orange juice. Estimated 80 g carbs. He has been reading labels and has decided he would switch to a lower carb cereal (~30 g per serving). He says he is willing to switch his orange juice to a diet orange soda if he needs to cut back more carbs. We discussed adding protein to breakfast by swapping regular milk to fairlife milk. He is amenable to these changes. Will provide a list of protein rich foods/breakfast ideas. Instructed him to continue keeping blood sugar log for fasting and post prandial.  He says he had A1C lab drawn yesterday but has not received the results yet. Will follow up tomorrow at in person rehab.  Andrey Campanile, MS, RDN, LDN   Andrey Campanile, MS, RDN, LDN

## 2020-01-26 ENCOUNTER — Encounter (HOSPITAL_COMMUNITY)
Admission: RE | Admit: 2020-01-26 | Discharge: 2020-01-26 | Disposition: A | Discharge status: Home / Self Care | Payer: Medicare Other | Source: Ambulatory Visit | Attending: Cardiovascular Disease | Admitting: Cardiovascular Disease

## 2020-01-26 ENCOUNTER — Other Ambulatory Visit: Payer: Self-pay

## 2020-01-26 VITALS — Ht 67.25 in | Wt 201.0 lb

## 2020-01-26 DIAGNOSIS — Z951 Presence of aortocoronary bypass graft: Secondary | ICD-10-CM | POA: Diagnosis not present

## 2020-01-26 DIAGNOSIS — Z7982 Long term (current) use of aspirin: Secondary | ICD-10-CM | POA: Diagnosis not present

## 2020-01-26 DIAGNOSIS — Z79899 Other long term (current) drug therapy: Secondary | ICD-10-CM | POA: Diagnosis not present

## 2020-01-26 DIAGNOSIS — I2510 Atherosclerotic heart disease of native coronary artery without angina pectoris: Secondary | ICD-10-CM | POA: Diagnosis not present

## 2020-01-26 DIAGNOSIS — Z87891 Personal history of nicotine dependence: Secondary | ICD-10-CM | POA: Diagnosis not present

## 2020-01-26 DIAGNOSIS — E119 Type 2 diabetes mellitus without complications: Secondary | ICD-10-CM | POA: Diagnosis not present

## 2020-01-26 DIAGNOSIS — E7800 Pure hypercholesterolemia, unspecified: Secondary | ICD-10-CM | POA: Diagnosis not present

## 2020-01-26 DIAGNOSIS — Z7901 Long term (current) use of anticoagulants: Secondary | ICD-10-CM | POA: Diagnosis not present

## 2020-01-26 DIAGNOSIS — Z794 Long term (current) use of insulin: Secondary | ICD-10-CM | POA: Diagnosis not present

## 2020-01-26 LAB — GLUCOSE, CAPILLARY: Glucose-Capillary: 349 mg/dL — ABNORMAL HIGH (ref 70–99)

## 2020-01-26 NOTE — Progress Notes (Signed)
Nutrition Note  Fred [name] arrived for cardiac rehab appointment for exercise. Upon checking in, his CBG was 349 mg/dl and he was unable to exercise. RD provided counseling. He reports taking his fasting CBG twice this morning because the value was higher than normal (226 then 185 mg/dl sequentially). Upon discussion, he revealed having a steroid shot in his hand for trigger finger the day before. Further discussion revealed there had been no missed meds and carbs had been fairly consistent. He had eaten the exact same thing the night before with the following fasting CBG being 99 mg/dl.  Instructed him to go home and continue checking CBGs while eating his normal, carb modified meals. He verbalizes understanding.  Andrey Campanile, MS, RDN, LDN

## 2020-01-28 ENCOUNTER — Encounter (HOSPITAL_COMMUNITY)
Admission: RE | Admit: 2020-01-28 | Discharge: 2020-01-28 | Disposition: A | Discharge status: Home / Self Care | Payer: Medicare Other | Source: Ambulatory Visit | Attending: Cardiovascular Disease | Admitting: Cardiovascular Disease

## 2020-01-28 ENCOUNTER — Other Ambulatory Visit: Payer: Self-pay

## 2020-01-28 DIAGNOSIS — I2510 Atherosclerotic heart disease of native coronary artery without angina pectoris: Secondary | ICD-10-CM | POA: Diagnosis not present

## 2020-01-28 DIAGNOSIS — Z951 Presence of aortocoronary bypass graft: Secondary | ICD-10-CM

## 2020-01-28 LAB — GLUCOSE, CAPILLARY
Glucose-Capillary: 153 mg/dL — ABNORMAL HIGH (ref 70–99)
Glucose-Capillary: 105 mg/dL — ABNORMAL HIGH (ref 70–99)
Glucose-Capillary: 71 mg/dL (ref 70–99)

## 2020-01-28 NOTE — Progress Notes (Signed)
Daily Session Note  Patient Details  Name: Fred [name] MRN: [id number] Date of Birth: 09/22/1941 Referring Provider:     CARDIAC REHAB PHASE II ORIENTATION from 01/06/2020 in [place]  Referring Provider  Quay Burow, MD      Encounter Date: 01/28/2020  Check In: Session Check In - 01/28/20 0849      Check-In   Supervising physician immediately available to respond to emergencies  [facility] immediately available    Physician(s)  Dr. Cyndia Skeeters    Location  [facility]    Staff Present  Jiles Garter, RN, Bjorn Loser, MS, Exercise Physiologist;Brittany Durene Fruits, BS, ACSM CEP, Exercise Physiologist;Tyara Carol Ada, MS,ACSM CEP, Exercise Physiologist    Virtual Visit  No    Medication changes reported      No    Fall or balance concerns reported     No    Tobacco Cessation  No Change    Warm-up and Cool-down  Performed on first and last piece of equipment    Resistance Training Performed  Yes    VAD Patient?  No    PAD/SET Patient?  No      Pain Assessment   Currently in Pain?  No/denies    Pain Score  0-No pain    Multiple Pain Sites  No       Capillary Blood Glucose: Results for orders placed or performed during the hospital encounter of 01/28/20 (from the past 24 hour(s))  Glucose, capillary     Status: Abnormal   Collection Time: 01/28/20  8:41 AM  Result Value Ref Range   Glucose-Capillary 153 (H) 70 - 99 mg/dL  Glucose, capillary     Status: None   Collection Time: 01/28/20  9:46 AM  Result Value Ref Range   Glucose-Capillary 71 70 - 99 mg/dL  Glucose, capillary     Status: Abnormal   Collection Time: 01/28/20 10:10 AM  Result Value Ref Range   Glucose-Capillary 105 (H) 70 - 99 mg/dL    Exercise Prescription Changes - 01/28/20 1100      Response to Exercise   Blood Pressure (Admit)  102/66    Blood Pressure (Exercise)  118/64    Blood Pressure (Exit)  102/60    Heart Rate (Admit)  75 bpm    Heart Rate (Exercise)  102 bpm    Heart Rate (Exit)  84 bpm    Rating of Perceived Exertion (Exercise)  11    Symptoms  none    Comments  Pt first day of exercise.     Duration  Continue with 30 min of aerobic exercise without signs/symptoms of physical distress.    Intensity  THRR unchanged      Progression   Progression  Continue to progress workloads to maintain intensity without signs/symptoms of physical distress.    Average METs  2.3      Resistance Training   Training Prescription  Yes    Weight  3 lbs.     Reps  10-15    Time  10 Minutes      Interval Training   Interval Training  No      NuStep   Level  2    SPM  85    Minutes  30    METs  2.3      Track   Minutes  --      Home Exercise Plan   Plans to continue exercise at  Home (comment)  Walking   Initial Home Exercises Provided  01/06/20       Social History   Tobacco Use  Smoking Status Former Smoker  . Years: 10.00  . Types: Cigarettes  . Quit date: 12/1962  . Years since quitting: 57.1  Smokeless Tobacco Never Used    Goals Met:  Exercise tolerated well  Goals Unmet:  Not Applicable  Comments: Pt started cardiac rehab today.  Pt tolerated light exercise without difficulty. VSS, telemetry-SR, asymptomatic.  Medication list reconciled. Pt denies barriers to medicaiton compliance.  PSYCHOSOCIAL ASSESSMENT:  PHQ-0. Pt exhibits positive coping skills, hopeful outlook with supportive family. No psychosocial needs identified at this time, no psychosocial interventions necessary.  Pt oriented to exercise equipment and routine.    Understanding verbalized.    Dr. Fransico Him is Medical Director for Cardiac Rehab at [facility].

## 2020-01-30 ENCOUNTER — Ambulatory Visit: Payer: Medicare Other | Attending: Internal Medicine

## 2020-01-30 DIAGNOSIS — Z23 Encounter for immunization: Secondary | ICD-10-CM | POA: Insufficient documentation

## 2020-01-30 NOTE — Progress Notes (Signed)
   Covid-19 Vaccination Clinic  Name:  Fred [name]    MRN: [id number] DOB: [birth year]  01/30/2020  [name] was observed post Covid-19 immunization for 15 minutes without incidence. He was provided with Vaccine Information Sheet and instruction to access the V-Safe system.   [name] was instructed to call 911 with any severe reactions post vaccine: Marland Kitchen Difficulty breathing  . Swelling of your face and throat  . A fast heartbeat  . A bad rash all over your body  . Dizziness and weakness    Immunizations Administered    Name Date Dose VIS Date Route   Pfizer COVID-19 Vaccine 01/30/2020  3:22 PM 0.3 mL 11/19/2019 Intramuscular   Manufacturer: [employer], [employer]   Lot: [id number]   NDC: [address]-1

## 2020-01-31 ENCOUNTER — Encounter (HOSPITAL_COMMUNITY)
Admission: RE | Admit: 2020-01-31 | Discharge: 2020-01-31 | Disposition: A | Discharge status: Home / Self Care | Payer: Medicare Other | Source: Ambulatory Visit | Attending: Cardiovascular Disease | Admitting: Cardiovascular Disease

## 2020-01-31 ENCOUNTER — Other Ambulatory Visit: Payer: Self-pay

## 2020-01-31 DIAGNOSIS — I2510 Atherosclerotic heart disease of native coronary artery without angina pectoris: Secondary | ICD-10-CM | POA: Diagnosis not present

## 2020-01-31 DIAGNOSIS — Z951 Presence of aortocoronary bypass graft: Secondary | ICD-10-CM

## 2020-01-31 LAB — GLUCOSE, CAPILLARY
Glucose-Capillary: 207 mg/dL — ABNORMAL HIGH (ref 70–99)
Glucose-Capillary: 116 mg/dL — ABNORMAL HIGH (ref 70–99)

## 2020-02-02 ENCOUNTER — Encounter (HOSPITAL_COMMUNITY)
Admission: RE | Admit: 2020-02-02 | Discharge: 2020-02-02 | Disposition: A | Discharge status: Home / Self Care | Payer: Medicare Other | Source: Ambulatory Visit | Attending: Cardiovascular Disease | Admitting: Cardiovascular Disease

## 2020-02-02 ENCOUNTER — Other Ambulatory Visit: Payer: Self-pay

## 2020-02-02 DIAGNOSIS — Z951 Presence of aortocoronary bypass graft: Secondary | ICD-10-CM

## 2020-02-02 DIAGNOSIS — I2510 Atherosclerotic heart disease of native coronary artery without angina pectoris: Secondary | ICD-10-CM | POA: Diagnosis not present

## 2020-02-02 LAB — GLUCOSE, CAPILLARY: Glucose-Capillary: 114 mg/dL — ABNORMAL HIGH (ref 70–99)

## 2020-02-02 NOTE — Progress Notes (Signed)
Nutrition Note  Spoke with Fred [name] during exercise. Reviewed simple vs complex carbohydrate education. He is already following guidelines for 60-75 g carbs per meal with addition of protein. His post prandial CBGs continue to spike into 200's. Recommended replacing special k cereal with bran flakes, oatmeal, or Total cereal to increase fiber with breakfast. Pt is amenable. Will continue to monitor his CBGs fasting and post prandial with a glucose and food log.  Andrey Campanile, MS, RDN, LDN

## 2020-02-04 ENCOUNTER — Other Ambulatory Visit: Payer: Self-pay

## 2020-02-04 ENCOUNTER — Encounter (HOSPITAL_COMMUNITY)
Admission: RE | Admit: 2020-02-04 | Discharge: 2020-02-04 | Disposition: A | Discharge status: Home / Self Care | Payer: Medicare Other | Source: Ambulatory Visit | Attending: Cardiovascular Disease | Admitting: Cardiovascular Disease

## 2020-02-04 DIAGNOSIS — I2510 Atherosclerotic heart disease of native coronary artery without angina pectoris: Secondary | ICD-10-CM | POA: Diagnosis not present

## 2020-02-04 DIAGNOSIS — Z951 Presence of aortocoronary bypass graft: Secondary | ICD-10-CM

## 2020-02-04 LAB — GLUCOSE, CAPILLARY
Glucose-Capillary: 104 mg/dL — ABNORMAL HIGH (ref 70–99)
Glucose-Capillary: 153 mg/dL — ABNORMAL HIGH (ref 70–99)

## 2020-02-07 ENCOUNTER — Other Ambulatory Visit: Payer: Self-pay

## 2020-02-07 ENCOUNTER — Encounter (HOSPITAL_COMMUNITY)
Admission: RE | Admit: 2020-02-07 | Discharge: 2020-02-07 | Disposition: A | Discharge status: Home / Self Care | Payer: Medicare Other | Source: Ambulatory Visit | Attending: Cardiovascular Disease | Admitting: Cardiovascular Disease

## 2020-02-07 DIAGNOSIS — Z7901 Long term (current) use of anticoagulants: Secondary | ICD-10-CM | POA: Diagnosis not present

## 2020-02-07 DIAGNOSIS — E119 Type 2 diabetes mellitus without complications: Secondary | ICD-10-CM | POA: Insufficient documentation

## 2020-02-07 DIAGNOSIS — Z79899 Other long term (current) drug therapy: Secondary | ICD-10-CM | POA: Diagnosis not present

## 2020-02-07 DIAGNOSIS — E7800 Pure hypercholesterolemia, unspecified: Secondary | ICD-10-CM | POA: Insufficient documentation

## 2020-02-07 DIAGNOSIS — Z87891 Personal history of nicotine dependence: Secondary | ICD-10-CM | POA: Insufficient documentation

## 2020-02-07 DIAGNOSIS — Z7982 Long term (current) use of aspirin: Secondary | ICD-10-CM | POA: Insufficient documentation

## 2020-02-07 DIAGNOSIS — I2510 Atherosclerotic heart disease of native coronary artery without angina pectoris: Secondary | ICD-10-CM | POA: Insufficient documentation

## 2020-02-07 DIAGNOSIS — Z794 Long term (current) use of insulin: Secondary | ICD-10-CM | POA: Diagnosis not present

## 2020-02-07 DIAGNOSIS — Z951 Presence of aortocoronary bypass graft: Secondary | ICD-10-CM | POA: Insufficient documentation

## 2020-02-07 LAB — GLUCOSE, CAPILLARY: Glucose-Capillary: 158 mg/dL — ABNORMAL HIGH (ref 70–99)

## 2020-02-09 ENCOUNTER — Other Ambulatory Visit: Payer: Self-pay

## 2020-02-09 ENCOUNTER — Encounter (HOSPITAL_COMMUNITY)
Admission: RE | Admit: 2020-02-09 | Discharge: 2020-02-09 | Disposition: A | Discharge status: Home / Self Care | Payer: Medicare Other | Source: Ambulatory Visit | Attending: Cardiovascular Disease | Admitting: Cardiovascular Disease

## 2020-02-09 DIAGNOSIS — Z951 Presence of aortocoronary bypass graft: Secondary | ICD-10-CM

## 2020-02-09 DIAGNOSIS — I2510 Atherosclerotic heart disease of native coronary artery without angina pectoris: Secondary | ICD-10-CM | POA: Diagnosis not present

## 2020-02-09 LAB — GLUCOSE, CAPILLARY: Glucose-Capillary: 210 mg/dL — ABNORMAL HIGH (ref 70–99)

## 2020-02-11 ENCOUNTER — Encounter (HOSPITAL_COMMUNITY)
Admission: RE | Admit: 2020-02-11 | Discharge: 2020-02-11 | Disposition: A | Discharge status: Home / Self Care | Payer: Medicare Other | Source: Ambulatory Visit | Attending: Cardiovascular Disease | Admitting: Cardiovascular Disease

## 2020-02-11 ENCOUNTER — Other Ambulatory Visit: Payer: Self-pay

## 2020-02-11 DIAGNOSIS — I2510 Atherosclerotic heart disease of native coronary artery without angina pectoris: Secondary | ICD-10-CM | POA: Diagnosis not present

## 2020-02-11 DIAGNOSIS — Z951 Presence of aortocoronary bypass graft: Secondary | ICD-10-CM

## 2020-02-14 ENCOUNTER — Encounter (HOSPITAL_COMMUNITY)
Admission: RE | Admit: 2020-02-14 | Discharge: 2020-02-14 | Disposition: A | Discharge status: Home / Self Care | Payer: Medicare Other | Source: Ambulatory Visit | Attending: Cardiovascular Disease | Admitting: Cardiovascular Disease

## 2020-02-14 ENCOUNTER — Other Ambulatory Visit: Payer: Self-pay

## 2020-02-14 DIAGNOSIS — Z951 Presence of aortocoronary bypass graft: Secondary | ICD-10-CM

## 2020-02-14 DIAGNOSIS — I2510 Atherosclerotic heart disease of native coronary artery without angina pectoris: Secondary | ICD-10-CM | POA: Diagnosis not present

## 2020-02-15 NOTE — Progress Notes (Signed)
I have reviewed a Home Exercise Prescription with Fred [name] . Fred [name] is  currently exercising at home.  The patient was advised to walk 2-4 days a week for 30-45 minutes.  Fred [name] and I discussed how to progress Fred [name] exercise prescription.  The patient stated that Fred [name] goals were to continue walking 2-4 days per week for 40 minutes in addition to CR program.  The patient stated that they understand the exercise prescription.  We reviewed exercise guidelines, target heart rate during exercise, RPE Scale, weather conditions, NTG use, endpoints for exercise, warmup and cool down.  Patient is encouraged to come to Fred [name] with any questions. I will continue to follow up with the patient to assist them with progression and safety.    Fred [name] BS, ACSM CEP 02/14/2020 0900

## 2020-02-16 ENCOUNTER — Other Ambulatory Visit: Payer: Self-pay

## 2020-02-16 ENCOUNTER — Encounter (HOSPITAL_COMMUNITY)
Admission: RE | Admit: 2020-02-16 | Discharge: 2020-02-16 | Disposition: A | Discharge status: Home / Self Care | Payer: Medicare Other | Source: Ambulatory Visit | Attending: Cardiovascular Disease | Admitting: Cardiovascular Disease

## 2020-02-16 DIAGNOSIS — I2510 Atherosclerotic heart disease of native coronary artery without angina pectoris: Secondary | ICD-10-CM | POA: Diagnosis not present

## 2020-02-16 DIAGNOSIS — Z951 Presence of aortocoronary bypass graft: Secondary | ICD-10-CM

## 2020-02-17 NOTE — Progress Notes (Signed)
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  aspirin EC 325 MG EC tablet, Take 1 tablet (325 mg total) by mouth daily., Disp: 30 tablet, Rfl: 0 .  B-D UF III MINI PEN NEEDLES 31G X 5 MM MISC, USE TO CHECK BLOOD SUGARS ONCE A DAY, Disp: , Rfl:  .  clotrimazole (LOTRIMIN) 1 % cream, Apply 1 application topically as needed (Groin)., Disp: , Rfl:  .  glimepiride (AMARYL) 4 MG tablet, Take 4 mg by mouth 2 (two) times daily. , Disp: , Rfl:  .  Insulin Glargine, 1 Unit Dial, (TOUJEO SOLOSTAR) 300 UNIT/ML SOPN, Inject 60 mg into the skin daily. , Disp: , Rfl:  .  metFORMIN (GLUCOPHAGE) 1000 MG tablet, Take 1,000 mg by mouth 2 (two) times daily with a meal. , Disp: , Rfl:  .  metoprolol tartrate (LOPRESSOR) 25 MG tablet, Take 0.5 tablets (12.5 mg total) by mouth 2 (two) times daily., Disp: 30 tablet, Rfl: 3 .  Multiple Vitamins-Minerals (MULTIVITAMIN WITH MINERALS) tablet, Take 1 tablet by mouth daily., Disp: , Rfl:  .  simvastatin (ZOCOR) 40 MG tablet, Take 40 mg by mouth daily at 6 PM. , Disp: , Rfl:  .  solifenacin (VESICARE) 5 MG tablet, Take 5 mg by mouth daily. , Disp: , Rfl:   Past Medical History: Past Medical History:  Diagnosis Date  . Coronary artery disease   . Diabetes mellitus without complication (HCC)   . High cholesterol   . Vertigo     Tobacco Use: Social History   Tobacco Use  Smoking Status Former Smoker  . Years: 10.00  . Types: Cigarettes  . Quit date: 12/1962  . Years  since quitting: 57.2  Smokeless Tobacco Never Used    Labs: Recent Hydrographic surveyor    Labs for ITP Cardiac and Pulmonary Rehab Latest Ref Rng & Units 10/12/2019 10/12/2019 10/13/2019 10/13/2019 10/14/2019   Cholestrol 0 - 200 mg/dL - - - - -   LDLCALC 0 - 99 mg/dL - - - - -   HDL >40 mg/dL - - - - -   Trlycerides <150 mg/dL - - - - -   Hemoglobin A1c 4.8 - 5.6 % - - - - -   PHART 7.350 - 7.450 7.325(L) 7.413 7.450 - -   PCO2ART 32.0 - 48.0 mmHg 38.6 34.3 32.5 - -   HCO3 20.0 - 28.0 mmol/L 20.1 21.9 22.5 - -   TCO2 22 - 32 mmol/L 21(L) 23 24 - -   ACIDBASEDEF 0.0 - 2.0 mmol/L 5.0(H) 2.0 1.0 - -   O2SAT % 96.0 91.0 95.0 64.4 64.2      Capillary Blood Glucose: Lab Results  Component Value Date   GLUCAP 210 (H) 02/09/2020   GLUCAP 158 (H) 02/07/2020   GLUCAP 104 (H) 02/04/2020   GLUCAP 153 (H) 02/04/2020   GLUCAP 114 (H) 02/02/2020     Exercise Target Goals: Exercise Program Goal: Individual exercise prescription set using results from initial  6 min walk test and THRR while considering  patient's activity barriers and safety.   Exercise Prescription Goal: Initial exercise prescription builds to 30-45 minutes a day of aerobic activity, 2-3 days per week.  Home exercise guidelines will be given to patient during program as part of exercise prescription that the participant will acknowledge.  Activity Barriers & Risk Stratification: Activity Barriers & Cardiac Risk Stratification - 01/06/20 0930      Activity Barriers & Cardiac Risk Stratification   Activity Barriers  None    Cardiac Risk Stratification  High       6 Minute Walk: 6 Minute Walk    Row Name 01/06/20 0942         6 Minute Walk   Phase  Initial     Distance  1515 feet     Walk Time  6 minutes     # of Rest Breaks  0     MPH  2.87     METS  2.43     RPE  11     Perceived Dyspnea   0     VO2 Peak  8.5     Symptoms  No     Resting HR  76 bpm     Resting BP  112/68     Resting Oxygen Saturation    97 %     Exercise Oxygen Saturation  during 6 min walk  97 %     Max Ex. HR  100 bpm     Max Ex. BP  112/58     2 Minute Post BP  108/58        Oxygen Initial Assessment:   Oxygen Re-Evaluation:   Oxygen Discharge (Final Oxygen Re-Evaluation):   Initial Exercise Prescription: Initial Exercise Prescription - 01/06/20 1000      Date of Initial Exercise RX and Referring Provider   Date  01/06/20    Referring Provider  Nanetta Batty, MD      Track   Minutes  30      Prescription Details   Frequency (times per week)  5-7    Duration  Progress to 30 minutes of continuous aerobic without signs/symptoms of physical distress      Intensity   THRR 40-80% of Max Heartrate  56-113    Ratings of Perceived Exertion  11-13    Perceived Dyspnea  0-4      Progression   Progression  Continue to progress workloads to maintain intensity without signs/symptoms of physical distress.      Resistance Training   Training Prescription  Yes    Weight  --   orange bands   Reps  10-15       Perform Capillary Blood Glucose checks as needed.  Exercise Prescription Changes: Exercise Prescription Changes    Row Name 01/17/20 0924 01/28/20 1100 02/14/20 1100         Response to Exercise   Blood Pressure (Admit)  --  102/66  108/52     Blood Pressure (Exercise)  --  118/64  114/64     Blood Pressure (Exit)  --  102/60  102/60     Heart Rate (Admit)  --  75 bpm  80 bpm     Heart Rate (Exercise)  --  102 bpm  102 bpm     Heart Rate (Exit)  --  84 bpm  84 bpm     Rating of Perceived Exertion (Exercise)  11  11  11  Symptoms  none  none  none     Comments  Virtual cardiac rehab session.  Pt first day of exercise.   --     Duration  --  Continue with 30 min of aerobic exercise without signs/symptoms of physical distress.  Continue with 30 min of aerobic exercise without signs/symptoms of physical distress.     Intensity  --  THRR unchanged  THRR unchanged       Progression    Progression  Continue to progress workloads to maintain intensity without signs/symptoms of physical distress.  Continue to progress workloads to maintain intensity without signs/symptoms of physical distress.  Continue to progress workloads to maintain intensity without signs/symptoms of physical distress.     Average METs  --  2.3  2       Resistance Training   Training Prescription  --  Yes  Yes     Weight  --  3 lbs.   4 lbs.      Reps  --  10-15  10-15     Time  --  10 Minutes  10 Minutes       Interval Training   Interval Training  --  No  No       Recumbant Bike   Level  --  --  3     Minutes  --  --  15     METs  --  --  2.1       NuStep   Level  --  2  3     SPM  --  85  85     Minutes  --  30  30     METs  --  2.3  1.9       Track   Minutes  40  --  --       Home Exercise Plan   Plans to continue exercise at  Home (comment) Walking  Home (comment) Walking  Home (comment) Walking     Initial Home Exercises Provided  01/06/20  01/06/20  01/06/20        Exercise Comments: Exercise Comments    Row Name 01/18/20 0925 01/28/20 1127 02/14/20 0900       Exercise Comments  Patient will be contacted to begin participation in the onsite cardiac rehab program after temporary closure due to COVID-19 pandemic.  Pt first day of exercise. Pt tolerated exercise well.  Reviewed HEP with Pt. Pt understands goals.        Exercise Goals and Review: Exercise Goals    Row Name 01/06/20 0933             Exercise Goals   Increase Physical Activity  Yes       Intervention  Provide advice, education, support and counseling about physical activity/exercise needs.;Develop an individualized exercise prescription for aerobic and resistive training based on initial evaluation findings, risk stratification, comorbidities and participant's personal goals.       Expected Outcomes  Short Term: Attend rehab on a regular basis to increase amount of physical activity.;Long Term: Exercising  regularly at least 3-5 days a week.;Long Term: Add in home exercise to make exercise part of routine and to increase amount of physical activity.       Increase Strength and Stamina  Yes       Intervention  Provide advice, education, support and counseling about physical activity/exercise needs.;Develop an individualized exercise prescription for aerobic and resistive training based on initial evaluation findings, risk  stratification, comorbidities and participant's personal goals.       Expected Outcomes  Short Term: Increase workloads from initial exercise prescription for resistance, speed, and METs.;Short Term: Perform resistance training exercises routinely during rehab and add in resistance training at home;Long Term: Improve cardiorespiratory fitness, muscular endurance and strength as measured by increased METs and functional capacity ( )       Able to understand and use rate of perceived exertion (RPE) scale  Yes       Intervention  Provide education and explanation on how to use RPE scale       Expected Outcomes  Short Term: Able to use RPE daily in rehab to express subjective intensity level;Long Term:  Able to use RPE to guide intensity level when exercising independently       Knowledge and understanding of Target Heart Rate Range (THRR)  Yes       Intervention  Provide education and explanation of THRR including how the numbers were predicted and where they are located for reference       Expected Outcomes  Short Term: Able to state/look up THRR;Long Term: Able to use THRR to govern intensity when exercising independently;Short Term: Able to use daily as guideline for intensity in rehab       Able to check pulse independently  Yes       Intervention  Provide education and demonstration on how to check pulse in carotid and radial arteries.;Review the importance of being able to check your own pulse for safety during independent exercise       Expected Outcomes  Short Term: Able to explain  why pulse checking is important during independent exercise;Long Term: Able to check pulse independently and accurately       Understanding of Exercise Prescription  Yes       Intervention  Provide education, explanation, and written materials on patient's individual exercise prescription       Expected Outcomes  Short Term: Able to explain program exercise prescription;Long Term: Able to explain home exercise prescription to exercise independently          Exercise Goals Re-Evaluation : Exercise Goals Re-Evaluation    Row Name 01/18/20 0926 01/28/20 1124 02/14/20 0900         Exercise Goal Re-Evaluation   Exercise Goals Review  --  Increase Physical Activity;Increase Strength and Stamina;Able to understand and use rate of perceived exertion (RPE) scale;Knowledge and understanding of Target Heart Rate Range (THRR);Understanding of Exercise Prescription  Increase Physical Activity;Increase Strength and Stamina;Able to understand and use rate of perceived exertion (RPE) scale;Knowledge and understanding of Target Heart Rate Range (THRR);Able to check pulse independently;Understanding of Exercise Prescription     Comments  Patient has been actively participating in the virtual cardiac rehab program via the [employer] app and has been progressing well. Patient is walking 20-40 minutes, 5-6 days/week.  Pt first day of exercise in CR program. Pt tolerated exercise well but is unable to perform the treadmill. Pt will continue with just the seated stepper until stamina increases. Pt understands THRR, RPE scale, and exercise Rx.  Reviewed HEP with Pt. Pt is currently wlaking 40 minutes per day 2-4 days per week in addition to CR program. Spoke to Pt about THRR, RPE scale, weather precautions, end points of exercise, and pulse counting. Pt expressed understanding of all.     Expected Outcomes  Patient will transition to the onsite cardiac rehab program.  Will continue to monitor and progress Pt as  tolerated.  Will continue to monitor and porgress Pt as tolerated.        Discharge Exercise Prescription (Final Exercise Prescription Changes): Exercise Prescription Changes - 02/14/20 1100      Response to Exercise   Blood Pressure (Admit)  108/52    Blood Pressure (Exercise)  114/64    Blood Pressure (Exit)  102/60    Heart Rate (Admit)  80 bpm    Heart Rate (Exercise)  102 bpm    Heart Rate (Exit)  84 bpm    Rating of Perceived Exertion (Exercise)  11    Symptoms  none    Duration  Continue with 30 min of aerobic exercise without signs/symptoms of physical distress.    Intensity  THRR unchanged      Progression   Progression  Continue to progress workloads to maintain intensity without signs/symptoms of physical distress.    Average METs  2      Resistance Training   Training Prescription  Yes    Weight  4 lbs.     Reps  10-15    Time  10 Minutes      Interval Training   Interval Training  No      Recumbant Bike   Level  3    Minutes  15    METs  2.1      NuStep   Level  3    SPM  85    Minutes  30    METs  1.9      Home Exercise Plan   Plans to continue exercise at  Home (comment)   Walking   Initial Home Exercises Provided  01/06/20       Nutrition:  Target Goals: Understanding of nutrition guidelines, daily intake of sodium 1500mg , cholesterol 200mg , calories 30% from fat and 7% or less from saturated fats, daily to have 5 or more servings of fruits and vegetables.  Biometrics: Pre Biometrics - 01/06/20 0925      Pre Biometrics   Height  5' 7.25" (1.708 m)    Weight  91.2 kg    Waist Circumference  42 inches    Hip Circumference  43.75 inches    Waist to Hip Ratio  0.96 %    BMI (Calculated)  31.26    Triceps Skinfold  7 mm    % Body Fat  27.2 %    Grip Strength  27.5 kg    Flexibility  14 in    Single Leg Stand  5.12 seconds        Nutrition Therapy Plan and Nutrition Goals: Nutrition Therapy & Goals - 01/26/20 1202      Nutrition  Therapy   Diet  Heart Healthy/carb modified    Drug/Food Interactions  Statins/Certain Fruits      Personal Nutrition Goals   Nutrition Goal  CBG concentrations in the normal range or as close to normal as is safely possible.    Personal Goal #2  Improved blood glucose control as evidenced by pt's A1c trending from 9.0 toward less than 7.0.    Personal Goal #3  Pt able to name foods that affect blood glucose      Intervention Plan   Intervention  Nutrition handout(s) given to patient.;Prescribe, educate and counsel regarding individualized specific dietary modifications aiming towards targeted core components such as weight, hypertension, lipid management, diabetes, heart failure and other comorbidities.    Expected Outcomes  Short Term Goal: A plan has been developed with personal  nutrition goals set during dietitian appointment.;Long Term Goal: Adherence to prescribed nutrition plan.       Nutrition Assessments: Nutrition Assessments - 02/16/20 0936      MEDFICTS Scores   Pre Score  36       Nutrition Goals Re-Evaluation: Nutrition Goals Re-Evaluation    Row Name 01/26/20 1203             Goals   Current Weight  201 lb (91.2 kg)       Nutrition Goal  CBG concentrations in the normal range or as close to normal as is safely possible.         Personal Goal #2 Re-Evaluation   Personal Goal #2  Improved blood glucose control as evidenced by pt's A1c trending from 9.0 toward less than 7.0.         Personal Goal #3 Re-Evaluation   Personal Goal #3  Pt able to name foods that affect blood glucose          Nutrition Goals Re-Evaluation: Nutrition Goals Re-Evaluation    Row Name 01/26/20 1203             Goals   Current Weight  201 lb (91.2 kg)       Nutrition Goal  CBG concentrations in the normal range or as close to normal as is safely possible.         Personal Goal #2 Re-Evaluation   Personal Goal #2  Improved blood glucose control as evidenced by pt's A1c  trending from 9.0 toward less than 7.0.         Personal Goal #3 Re-Evaluation   Personal Goal #3  Pt able to name foods that affect blood glucose          Nutrition Goals Discharge (Final Nutrition Goals Re-Evaluation): Nutrition Goals Re-Evaluation - 01/26/20 1203      Goals   Current Weight  201 lb (91.2 kg)    Nutrition Goal  CBG concentrations in the normal range or as close to normal as is safely possible.      Personal Goal #2 Re-Evaluation   Personal Goal #2  Improved blood glucose control as evidenced by pt's A1c trending from 9.0 toward less than 7.0.      Personal Goal #3 Re-Evaluation   Personal Goal #3  Pt able to name foods that affect blood glucose       Psychosocial: Target Goals: Acknowledge presence or absence of significant depression and/or stress, maximize coping skills, provide positive support system. Participant is able to verbalize types and ability to use techniques and skills needed for reducing stress and depression.  Initial Review & Psychosocial Screening: Initial Psych Review & Screening - 01/06/20 0952      Initial Review   Current issues with  None Identified      Family Dynamics   Good Support System?  Yes   Marcellas has his wife for support     Barriers   Psychosocial barriers to participate in program  There are no identifiable barriers or psychosocial needs.      Screening Interventions   Interventions  Encouraged to exercise       Quality of Life Scores: Quality of Life - 01/06/20 1022      Quality of Life   Select  Quality of Life      Quality of Life Scores   Health/Function Pre  25.93 %    Socioeconomic Pre  28.19 %    Psych/Spiritual Pre  28.57 %    Family Pre  26.4 %    GLOBAL Pre  27.04 %      Scores of 19 and below usually indicate a poorer quality of life in these areas.  A difference of  2-3 points is a clinically meaningful difference.  A difference of 2-3 points in the total score of the Quality of Life Index has  been associated with significant improvement in overall quality of life, self-image, physical symptoms, and general health in studies assessing change in quality of life.  PHQ-9: Recent Review Flowsheet Data    Depression screen [facility] 2/9 01/06/2020   Decreased Interest 0   Down, Depressed, Hopeless 0   PHQ - 2 Score 0     Interpretation of Total Score  Total Score Depression Severity:  1-4 = Minimal depression, 5-9 = Mild depression, 10-14 = Moderate depression, 15-19 = Moderately severe depression, 20-27 = Severe depression   Psychosocial Evaluation and Intervention: Psychosocial Evaluation - 01/28/20 1121      Psychosocial Evaluation & Interventions   Interventions  Encouraged to exercise with the program and follow exercise prescription    Comments  Adrik does not report any psychosocial barriers. He enjoys working with tools and home repairs.    Expected Outcomes  Takota will maintain a positive outlook with good coping skills.    Continue Psychosocial Services   No Follow up required       Psychosocial Re-Evaluation: Psychosocial Re-Evaluation    Row Name 01/17/20 1220 02/11/20 1148           Psychosocial Re-Evaluation   Current issues with  None Identified  None Identified      Comments  --  No psychosocial interventions necessary.      Expected Outcomes  --  Gibson will maintain a positive outlook with good coping skills.      Interventions  Encouraged to attend Cardiac Rehabilitation for the exercise  Encouraged to attend Cardiac Rehabilitation for the exercise      Continue Psychosocial Services   No Follow up required  No Follow up required         Psychosocial Discharge (Final Psychosocial Re-Evaluation): Psychosocial Re-Evaluation - 02/11/20 1148      Psychosocial Re-Evaluation   Current issues with  None Identified    Comments  No psychosocial interventions necessary.    Expected Outcomes  Tawfiq will maintain a positive outlook with good coping skills.     Interventions  Encouraged to attend Cardiac Rehabilitation for the exercise    Continue Psychosocial Services   No Follow up required       Vocational Rehabilitation: Provide vocational rehab assistance to qualifying candidates.   Vocational Rehab Evaluation & Intervention: Vocational Rehab - 01/06/20 0953      Initial Vocational Rehab Evaluation & Intervention   Assessment shows need for Vocational Rehabilitation  No   Bairon is retired and does not need vocational rehab at this time      Education: Education Goals: Education classes will be provided on a weekly basis, covering required topics. Participant will state understanding/return demonstration of topics presented.  Learning Barriers/Preferences: Learning Barriers/Preferences - 01/06/20 1155      Learning Barriers/Preferences   Learning Barriers  Sight;Hearing   Wears reading glasses. Deaf in right ear.   Learning Preferences  Skilled Demonstration       Education Topics: Count Your Pulse:  -Group instruction provided by verbal instruction, demonstration, patient participation and written materials to support subject.  Instructors address  importance of being able to find your pulse and how to count your pulse when at home without a heart monitor.  Patients get hands on experience counting their pulse with staff help and individually.   Heart Attack, Angina, and Risk Factor Modification:  -Group instruction provided by verbal instruction, video, and written materials to support subject.  Instructors address signs and symptoms of angina and heart attacks.    Also discuss risk factors for heart disease and how to make changes to improve heart health risk factors.   Functional Fitness:  -Group instruction provided by verbal instruction, demonstration, patient participation, and written materials to support subject.  Instructors address safety measures for doing things around the house.  Discuss how to get up and down off  the floor, how to pick things up properly, how to safely get out of a chair without assistance, and balance training.   Meditation and Mindfulness:  -Group instruction provided by verbal instruction, patient participation, and written materials to support subject.  Instructor addresses importance of mindfulness and meditation practice to help reduce stress and improve awareness.  Instructor also leads participants through a meditation exercise.    Stretching for Flexibility and Mobility:  -Group instruction provided by verbal instruction, patient participation, and written materials to support subject.  Instructors lead participants through series of stretches that are designed to increase flexibility thus improving mobility.  These stretches are additional exercise for major muscle groups that are typically performed during regular warm up and cool down.   Hands Only CPR:  -Group verbal, video, and participation provides a basic overview of AHA guidelines for community CPR. Role-play of emergencies allow participants the opportunity to practice calling for help and chest compression technique with discussion of AED use.   Hypertension: -Group verbal and written instruction that provides a basic overview of hypertension including the most recent diagnostic guidelines, risk factor reduction with self-care instructions and medication management.    Nutrition I class: Heart Healthy Eating:  -Group instruction provided by PowerPoint slides, verbal discussion, and written materials to support subject matter. The instructor gives an explanation and review of the Therapeutic Lifestyle Changes diet recommendations, which includes a discussion on lipid goals, dietary fat, sodium, fiber, plant stanol/sterol esters, sugar, and the components of a well-balanced, healthy diet.   Nutrition II class: Lifestyle Skills:  -Group instruction provided by PowerPoint slides, verbal discussion, and written  materials to support subject matter. The instructor gives an explanation and review of label reading, grocery shopping for heart health, heart healthy recipe modifications, and ways to make healthier choices when eating out.   Diabetes Question & Answer:  -Group instruction provided by PowerPoint slides, verbal discussion, and written materials to support subject matter. The instructor gives an explanation and review of diabetes co-morbidities, pre- and post-prandial blood glucose goals, pre-exercise blood glucose goals, signs, symptoms, and treatment of hypoglycemia and hyperglycemia, and foot care basics.   Diabetes Blitz:  -Group instruction provided by PowerPoint slides, verbal discussion, and written materials to support subject matter. The instructor gives an explanation and review of the physiology behind type 1 and type 2 diabetes, diabetes medications and rational behind using different medications, pre- and post-prandial blood glucose recommendations and Hemoglobin A1c goals, diabetes diet, and exercise including blood glucose guidelines for exercising safely.    Portion Distortion:  -Group instruction provided by PowerPoint slides, verbal discussion, written materials, and food models to support subject matter. The instructor gives an explanation of serving size versus portion size, changes in  portions sizes over the last 20 years, and what consists of a serving from each food group.   Stress Management:  -Group instruction provided by verbal instruction, video, and written materials to support subject matter.  Instructors review role of stress in heart disease and how to cope with stress positively.     Exercising on Your Own:  -Group instruction provided by verbal instruction, power point, and written materials to support subject.  Instructors discuss benefits of exercise, components of exercise, frequency and intensity of exercise, and end points for exercise.  Also discuss use of  nitroglycerin and activating EMS.  Review options of places to exercise outside of rehab.  Review guidelines for sex with heart disease.   Cardiac Drugs I:  -Group instruction provided by verbal instruction and written materials to support subject.  Instructor reviews cardiac drug classes: antiplatelets, anticoagulants, beta blockers, and statins.  Instructor discusses reasons, side effects, and lifestyle considerations for each drug class.   Cardiac Drugs II:  -Group instruction provided by verbal instruction and written materials to support subject.  Instructor reviews cardiac drug classes: angiotensin converting enzyme inhibitors (ACE-I), angiotensin II receptor blockers (ARBs), nitrates, and calcium channel blockers.  Instructor discusses reasons, side effects, and lifestyle considerations for each drug class.   Anatomy and Physiology of the Circulatory System:  Group verbal and written instruction and models provide basic cardiac anatomy and physiology, with the coronary electrical and arterial systems. Review of: AMI, Angina, Valve disease, Heart Failure, Peripheral Artery Disease, Cardiac Arrhythmia, Pacemakers, and the ICD.   Other Education:  -Group or individual verbal, written, or video instructions that support the educational goals of the cardiac rehab program.   Holiday Eating Survival Tips:  -Group instruction provided by PowerPoint slides, verbal discussion, and written materials to support subject matter. The instructor gives patients tips, tricks, and techniques to help them not only survive but enjoy the holidays despite the onslaught of food that accompanies the holidays.   Knowledge Questionnaire Score: Knowledge Questionnaire Score - 01/06/20 1026      Knowledge Questionnaire Score   Pre Score  20/24       Core Components/Risk Factors/Patient Goals at Admission: Personal Goals and Risk Factors at Admission - 01/06/20 1113      Core Components/Risk  Factors/Patient Goals on Admission    Weight Management  Yes;Obesity    Intervention  Weight Management/Obesity: Establish reasonable short term and long term weight goals.;Obesity: Provide education and appropriate resources to help participant work on and attain dietary goals.    Admit Weight  201 lb 1 oz (91.2 kg)    Expected Outcomes  Short Term: Continue to assess and modify interventions until short term weight is achieved;Long Term: Adherence to nutrition and physical activity/exercise program aimed toward attainment of established weight goal;Weight Loss: Understanding of general recommendations for a balanced deficit meal plan, which promotes 1-2 lb weight loss per week and includes a negative energy balance of [phone] kcal/d    Diabetes  Yes    Intervention  Provide education about signs/symptoms and action to take for hypo/hyperglycemia.;Provide education about proper nutrition, including hydration, and aerobic/resistive exercise prescription along with prescribed medications to achieve blood glucose in normal ranges: Fasting glucose 65-99 mg/dL    Expected Outcomes  Short Term: Participant verbalizes understanding of the signs/symptoms and immediate care of hyper/hypoglycemia, proper foot care and importance of medication, aerobic/resistive exercise and nutrition plan for blood glucose control.;Long Term: Attainment of HbA1C < 7%.    Lipids  Yes  Intervention  Provide education and support for participant on nutrition & aerobic/resistive exercise along with prescribed medications to achieve LDL 70mg , HDL >40mg .    Expected Outcomes  Short Term: Participant states understanding of desired cholesterol values and is compliant with medications prescribed. Participant is following exercise prescription and nutrition guidelines.;Long Term: Cholesterol controlled with medications as prescribed, with individualized exercise RX and with personalized nutrition plan. Value goals: LDL < , HDL > 40  mg.       Core Components/Risk Factors/Patient Goals Review:  Goals and Risk Factor Review    Row Name 01/17/20 1223 01/28/20 1126 02/11/20 1150         Core Components/Risk Factors/Patient Goals Review   Personal Goals Review  Weight Management/Obesity;Diabetes;Lipids  Weight Management/Obesity;Diabetes;Lipids  Weight Management/Obesity;Diabetes;Lipids     Review  Eri is doing well with exercise and is documenting his exercise on the virtual cardiac rehab [facility] APP. Greydon ahs been documenting his vital signs. Wayne's vital signs  Pt with multiple CAD RFs willing to participate in CR exercise.  Jaxan would like to increase his strength and stamina.  Pt with multiple CAD RFs willing to participate in CR exercise.  Garrett feels that he is decreasing his soreness and stiffness.     Expected Outcomes  Patient will continue to participate in virtual cardiac rehab for nutrtion, exercise and lifestyle modifications  Patient will continue to participate in cardiac rehab for nutrtion, exercise and lifestyle modification opportunities.  Patient will continue to participate in cardiac rehab for nutrtion, exercise and lifestyle modification opportunities.        Core Components/Risk Factors/Patient Goals at Discharge (Final Review):  Goals and Risk Factor Review - 02/11/20 1150      Core Components/Risk Factors/Patient Goals Review   Personal Goals Review  Weight Management/Obesity;Diabetes;Lipids    Review  Pt with multiple CAD RFs willing to participate in CR exercise.  Halsey feels that he is decreasing his soreness and stiffness.    Expected Outcomes  Patient will continue to participate in cardiac rehab for nutrtion, exercise and lifestyle modification opportunities.       ITP Comments: ITP Comments    Row Name 01/06/20 0919 01/17/20 1219 01/28/20 1056 02/11/20 1141     ITP Comments  Medical Director- Dr. Armanda Magic, MD  30 Day ITP Review. Hobert is doing well with virtual cardiac  rehab via the [facility] APP  Ellwood started exercise today and tolerated it well.  BP stable though his CBG decreased to 71 at the end of exercise.  Pt's breakfast complete with both carbs and protein as recommended by dietitian.  Jayren continues to tolerate exercise well.  His CBG has stabilized with exercise.  Verle reports that with his participation in exercise he feels less sore and stiff.       Comments: See ITP Comments.

## 2020-02-18 ENCOUNTER — Encounter (HOSPITAL_COMMUNITY)
Admission: RE | Admit: 2020-02-18 | Discharge: 2020-02-18 | Disposition: A | Discharge status: Home / Self Care | Payer: Medicare Other | Source: Ambulatory Visit | Attending: Cardiovascular Disease | Admitting: Cardiovascular Disease

## 2020-02-18 ENCOUNTER — Other Ambulatory Visit: Payer: Self-pay

## 2020-02-18 DIAGNOSIS — Z951 Presence of aortocoronary bypass graft: Secondary | ICD-10-CM

## 2020-02-18 DIAGNOSIS — I2510 Atherosclerotic heart disease of native coronary artery without angina pectoris: Secondary | ICD-10-CM | POA: Diagnosis not present

## 2020-02-21 ENCOUNTER — Encounter (HOSPITAL_COMMUNITY)
Admission: RE | Admit: 2020-02-21 | Discharge: 2020-02-21 | Disposition: A | Discharge status: Home / Self Care | Payer: Medicare Other | Source: Ambulatory Visit | Attending: Cardiovascular Disease | Admitting: Cardiovascular Disease

## 2020-02-21 ENCOUNTER — Other Ambulatory Visit: Payer: Self-pay

## 2020-02-21 DIAGNOSIS — I2510 Atherosclerotic heart disease of native coronary artery without angina pectoris: Secondary | ICD-10-CM | POA: Diagnosis not present

## 2020-02-21 DIAGNOSIS — Z951 Presence of aortocoronary bypass graft: Secondary | ICD-10-CM

## 2020-02-23 ENCOUNTER — Encounter (HOSPITAL_COMMUNITY): Payer: Medicare Other

## 2020-02-23 ENCOUNTER — Ambulatory Visit: Payer: Medicare Other | Attending: Internal Medicine

## 2020-02-23 DIAGNOSIS — Z23 Encounter for immunization: Secondary | ICD-10-CM

## 2020-02-23 NOTE — Progress Notes (Signed)
   Covid-19 Vaccination Clinic  Name:  TAIGE HOUSMAN    MRN: [id number] DOB: [birth year]  02/23/2020  Mr. Depuy was observed post Covid-19 immunization for 15 minutes without incident. He was provided with Vaccine Information Sheet and instruction to access the V-Safe system.   Mr. Mosqueda was instructed to call 911 with any severe reactions post vaccine: Marland Kitchen Difficulty breathing  . Swelling of face and throat  . A fast heartbeat  . A bad rash all over body  . Dizziness and weakness   Immunizations Administered    Name Date Dose VIS Date Route   Pfizer COVID-19 Vaccine 02/23/2020 10:16 AM 0.3 mL 11/19/2019 Intramuscular   Manufacturer: [employer], [employer]   Lot: [id number]   NDC: [address]-8

## 2020-02-25 ENCOUNTER — Encounter (HOSPITAL_COMMUNITY)
Admission: RE | Admit: 2020-02-25 | Discharge: 2020-02-25 | Disposition: A | Discharge status: Home / Self Care | Payer: Medicare Other | Source: Ambulatory Visit | Attending: Cardiovascular Disease | Admitting: Cardiovascular Disease

## 2020-02-25 ENCOUNTER — Other Ambulatory Visit: Payer: Self-pay

## 2020-02-25 DIAGNOSIS — I2510 Atherosclerotic heart disease of native coronary artery without angina pectoris: Secondary | ICD-10-CM | POA: Diagnosis not present

## 2020-02-25 DIAGNOSIS — Z951 Presence of aortocoronary bypass graft: Secondary | ICD-10-CM

## 2020-02-25 MED ORDER — METOPROLOL TARTRATE 25 MG PO TABS: 12.5000 mg | ORAL_TABLET | Freq: Two times a day (BID) | ORAL | 2 refills | Status: DC

## 2020-02-28 ENCOUNTER — Encounter (HOSPITAL_COMMUNITY)
Admission: RE | Admit: 2020-02-28 | Discharge: 2020-02-28 | Disposition: A | Discharge status: Home / Self Care | Payer: Medicare Other | Source: Ambulatory Visit | Attending: Cardiovascular Disease | Admitting: Cardiovascular Disease

## 2020-02-28 ENCOUNTER — Other Ambulatory Visit: Payer: Self-pay

## 2020-02-28 DIAGNOSIS — Z951 Presence of aortocoronary bypass graft: Secondary | ICD-10-CM

## 2020-02-28 DIAGNOSIS — I2510 Atherosclerotic heart disease of native coronary artery without angina pectoris: Secondary | ICD-10-CM | POA: Diagnosis not present

## 2020-03-01 ENCOUNTER — Encounter (HOSPITAL_COMMUNITY)
Admission: RE | Admit: 2020-03-01 | Discharge: 2020-03-01 | Disposition: A | Discharge status: Home / Self Care | Payer: Medicare Other | Source: Ambulatory Visit | Attending: Cardiovascular Disease | Admitting: Cardiovascular Disease

## 2020-03-01 ENCOUNTER — Other Ambulatory Visit: Payer: Self-pay

## 2020-03-01 DIAGNOSIS — I2510 Atherosclerotic heart disease of native coronary artery without angina pectoris: Secondary | ICD-10-CM | POA: Diagnosis not present

## 2020-03-01 DIAGNOSIS — Z951 Presence of aortocoronary bypass graft: Secondary | ICD-10-CM

## 2020-03-03 ENCOUNTER — Other Ambulatory Visit: Payer: Self-pay

## 2020-03-03 ENCOUNTER — Encounter (HOSPITAL_COMMUNITY)
Admission: RE | Admit: 2020-03-03 | Discharge: 2020-03-03 | Disposition: A | Discharge status: Home / Self Care | Payer: Medicare Other | Source: Ambulatory Visit | Attending: Cardiovascular Disease | Admitting: Cardiovascular Disease

## 2020-03-03 DIAGNOSIS — I2510 Atherosclerotic heart disease of native coronary artery without angina pectoris: Secondary | ICD-10-CM | POA: Diagnosis not present

## 2020-03-03 DIAGNOSIS — Z951 Presence of aortocoronary bypass graft: Secondary | ICD-10-CM

## 2020-03-06 ENCOUNTER — Other Ambulatory Visit: Payer: Self-pay

## 2020-03-06 ENCOUNTER — Encounter (HOSPITAL_COMMUNITY)
Admission: RE | Admit: 2020-03-06 | Discharge: 2020-03-06 | Disposition: A | Discharge status: Home / Self Care | Payer: Medicare Other | Source: Ambulatory Visit | Attending: Cardiovascular Disease | Admitting: Cardiovascular Disease

## 2020-03-06 DIAGNOSIS — I2510 Atherosclerotic heart disease of native coronary artery without angina pectoris: Secondary | ICD-10-CM | POA: Diagnosis not present

## 2020-03-06 DIAGNOSIS — Z951 Presence of aortocoronary bypass graft: Secondary | ICD-10-CM

## 2020-03-08 ENCOUNTER — Other Ambulatory Visit: Payer: Self-pay

## 2020-03-08 ENCOUNTER — Encounter (HOSPITAL_COMMUNITY)
Admission: RE | Admit: 2020-03-08 | Discharge: 2020-03-08 | Disposition: A | Discharge status: Home / Self Care | Payer: Medicare Other | Source: Ambulatory Visit | Attending: Cardiovascular Disease | Admitting: Cardiovascular Disease

## 2020-03-08 DIAGNOSIS — Z951 Presence of aortocoronary bypass graft: Secondary | ICD-10-CM

## 2020-03-08 DIAGNOSIS — I2510 Atherosclerotic heart disease of native coronary artery without angina pectoris: Secondary | ICD-10-CM | POA: Diagnosis not present

## 2020-03-10 ENCOUNTER — Other Ambulatory Visit: Payer: Self-pay

## 2020-03-10 ENCOUNTER — Encounter (HOSPITAL_COMMUNITY)
Admission: RE | Admit: 2020-03-10 | Discharge: 2020-03-10 | Disposition: A | Discharge status: Home / Self Care | Payer: Medicare Other | Source: Ambulatory Visit | Attending: Cardiovascular Disease | Admitting: Cardiovascular Disease

## 2020-03-10 DIAGNOSIS — I2510 Atherosclerotic heart disease of native coronary artery without angina pectoris: Secondary | ICD-10-CM | POA: Diagnosis not present

## 2020-03-10 DIAGNOSIS — Z79899 Other long term (current) drug therapy: Secondary | ICD-10-CM | POA: Insufficient documentation

## 2020-03-10 DIAGNOSIS — E119 Type 2 diabetes mellitus without complications: Secondary | ICD-10-CM | POA: Insufficient documentation

## 2020-03-10 DIAGNOSIS — Z7982 Long term (current) use of aspirin: Secondary | ICD-10-CM | POA: Diagnosis not present

## 2020-03-10 DIAGNOSIS — Z87891 Personal history of nicotine dependence: Secondary | ICD-10-CM | POA: Insufficient documentation

## 2020-03-10 DIAGNOSIS — Z7901 Long term (current) use of anticoagulants: Secondary | ICD-10-CM | POA: Insufficient documentation

## 2020-03-10 DIAGNOSIS — Z951 Presence of aortocoronary bypass graft: Secondary | ICD-10-CM | POA: Insufficient documentation

## 2020-03-10 DIAGNOSIS — Z794 Long term (current) use of insulin: Secondary | ICD-10-CM | POA: Diagnosis not present

## 2020-03-10 DIAGNOSIS — E7800 Pure hypercholesterolemia, unspecified: Secondary | ICD-10-CM | POA: Insufficient documentation

## 2020-03-13 ENCOUNTER — Encounter (HOSPITAL_COMMUNITY)
Admission: RE | Admit: 2020-03-13 | Discharge: 2020-03-13 | Disposition: A | Discharge status: Home / Self Care | Payer: Medicare Other | Source: Ambulatory Visit | Attending: Cardiovascular Disease | Admitting: Cardiovascular Disease

## 2020-03-13 ENCOUNTER — Other Ambulatory Visit: Payer: Self-pay

## 2020-03-13 VITALS — Ht 67.25 in | Wt 204.6 lb

## 2020-03-13 DIAGNOSIS — Z951 Presence of aortocoronary bypass graft: Secondary | ICD-10-CM

## 2020-03-13 DIAGNOSIS — I2510 Atherosclerotic heart disease of native coronary artery without angina pectoris: Secondary | ICD-10-CM | POA: Diagnosis not present

## 2020-03-15 ENCOUNTER — Other Ambulatory Visit: Payer: Self-pay

## 2020-03-15 ENCOUNTER — Encounter (HOSPITAL_COMMUNITY)
Admission: RE | Admit: 2020-03-15 | Discharge: 2020-03-15 | Disposition: A | Discharge status: Home / Self Care | Payer: Medicare Other | Source: Ambulatory Visit | Attending: Cardiovascular Disease | Admitting: Cardiovascular Disease

## 2020-03-15 DIAGNOSIS — Z951 Presence of aortocoronary bypass graft: Secondary | ICD-10-CM

## 2020-03-15 DIAGNOSIS — I2510 Atherosclerotic heart disease of native coronary artery without angina pectoris: Secondary | ICD-10-CM | POA: Diagnosis not present

## 2020-03-16 NOTE — Progress Notes (Signed)
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  aspirin EC 325 MG EC tablet, Take 1 tablet (325 mg total) by mouth daily., Disp: 30 tablet, Rfl: 0 .  B-D UF III MINI PEN NEEDLES 31G X 5 MM MISC, USE TO CHECK BLOOD SUGARS ONCE A DAY, Disp: , Rfl:  .  clotrimazole (LOTRIMIN) 1 % cream, Apply 1 application topically as needed (Groin)., Disp: , Rfl:  .  glimepiride (AMARYL) 4 MG tablet, Take 4 mg by mouth 2 (two) times daily. , Disp: , Rfl:  .  Insulin Glargine, 1 Unit Dial, (TOUJEO SOLOSTAR) 300 UNIT/ML SOPN, Inject 60 mg into the skin daily. , Disp: , Rfl:  .  metFORMIN (GLUCOPHAGE) 1000 MG tablet, Take 1,000 mg by mouth 2 (two) times daily with a meal. , Disp: , Rfl:  .  metoprolol tartrate (LOPRESSOR) 25 MG tablet, Take 0.5 tablets (12.5 mg total) by mouth 2 (two) times daily., Disp: 90 tablet, Rfl: 2 .  Multiple Vitamins-Minerals (MULTIVITAMIN WITH MINERALS) tablet, Take 1 tablet by mouth daily., Disp: , Rfl:  .  simvastatin (ZOCOR) 40 MG tablet, Take 40 mg by mouth daily at 6 PM. , Disp: , Rfl:  .  solifenacin (VESICARE) 5 MG tablet, Take 5 mg by mouth daily. , Disp: , Rfl:   Past Medical History: Past Medical History:  Diagnosis Date  . Coronary artery disease   . Diabetes mellitus without complication (HCC)   . High cholesterol   . Vertigo     Tobacco Use: Social History   Tobacco Use  Smoking Status Former Smoker  . Years: 10.00  . Types: Cigarettes  . Quit date: 12/1962  . Years  since quitting: 57.3  Smokeless Tobacco Never Used    Labs: Recent Hydrographic surveyor    Labs for ITP Cardiac and Pulmonary Rehab Latest Ref Rng & Units 10/12/2019 10/12/2019 10/13/2019 10/13/2019 10/14/2019   Cholestrol 0 - 200 mg/dL - - - - -   LDLCALC 0 - 99 mg/dL - - - - -   HDL >40 mg/dL - - - - -   Trlycerides <150 mg/dL - - - - -   Hemoglobin A1c 4.8 - 5.6 % - - - - -   PHART 7.350 - 7.450 7.325(L) 7.413 7.450 - -   PCO2ART 32.0 - 48.0 mmHg 38.6 34.3 32.5 - -   HCO3 20.0 - 28.0 mmol/L 20.1 21.9 22.5 - -   TCO2 22 - 32 mmol/L 21(L) 23 24 - -   ACIDBASEDEF 0.0 - 2.0 mmol/L 5.0(H) 2.0 1.0 - -   O2SAT % 96.0 91.0 95.0 64.4 64.2      Capillary Blood Glucose: Lab Results  Component Value Date   GLUCAP 210 (H) 02/09/2020   GLUCAP 158 (H) 02/07/2020   GLUCAP 104 (H) 02/04/2020   GLUCAP 153 (H) 02/04/2020   GLUCAP 114 (H) 02/02/2020     Exercise Target Goals: Exercise Program Goal: Individual exercise prescription set using results from initial  6 min walk test and THRR while considering  patient's activity barriers and safety.   Exercise Prescription Goal: Initial exercise prescription builds to 30-45 minutes a day of aerobic activity, 2-3 days per week.  Home exercise guidelines will be given to patient during program as part of exercise prescription that the participant will acknowledge.  Activity Barriers & Risk Stratification: Activity Barriers & Cardiac Risk Stratification - 01/06/20 0930      Activity Barriers & Cardiac Risk Stratification   Activity Barriers  None    Cardiac Risk Stratification  High       6 Minute Walk: 6 Minute Walk    Row Name 01/06/20 0942 03/13/20 1034       6 Minute Walk   Phase  Initial  Discharge    Distance  1515 feet  1600 feet    Distance % Change  --  5.61 %    Distance Feet Change  --  85 ft    Walk Time  6 minutes  6 minutes    # of Rest Breaks  0  0    MPH  2.87  3    METS  2.43  2.6    RPE  11  11    Perceived Dyspnea    0  0    VO2 Peak  8.5  9.1    Symptoms  No  No    Resting HR  76 bpm  82 bpm    Resting BP  112/68  104/72    Resting Oxygen Saturation   97 %  --    Exercise Oxygen Saturation  during 6 min walk  97 %  --    Max Ex. HR  100 bpm  100 bpm    Max Ex. BP  112/58  120/60    2 Minute Post BP  108/58  100/64       Oxygen Initial Assessment:   Oxygen Re-Evaluation:   Oxygen Discharge (Final Oxygen Re-Evaluation):   Initial Exercise Prescription: Initial Exercise Prescription - 01/06/20 1000      Date of Initial Exercise RX and Referring Provider   Date  01/06/20    Referring Provider  Nanetta Batty, MD      Track   Minutes  30      Prescription Details   Frequency (times per week)  5-7    Duration  Progress to 30 minutes of continuous aerobic without signs/symptoms of physical distress      Intensity   THRR 40-80% of Max Heartrate  56-113    Ratings of Perceived Exertion  11-13    Perceived Dyspnea  0-4      Progression   Progression  Continue to progress workloads to maintain intensity without signs/symptoms of physical distress.      Resistance Training   Training Prescription  Yes    Weight  --   orange bands   Reps  10-15       Perform Capillary Blood Glucose checks as needed.  Exercise Prescription Changes: Exercise Prescription Changes    Row Name 01/17/20 0924 01/28/20 1100 02/14/20 1100 02/25/20 1341 03/10/20 1339     Response to Exercise   Blood Pressure (Admit)  --  102/66  108/52  110/58  104/56   Blood Pressure (Exercise)  --  118/64  114/64  120/60  132/80   Blood Pressure (Exit)  --  102/60  102/60  120/60  102/58   Heart Rate (Admit)  --  75 bpm  80 bpm  89 bpm  89 bpm   Heart Rate (Exercise)  --  102 bpm  102 bpm  101 bpm  98 bpm   Heart Rate (Exit)  --  84 bpm  84 bpm  95 bpm  88 bpm   Rating of Perceived Exertion (Exercise)  11  11  11  12  12    Perceived Dyspnea (Exercise)  --  --  --  0  0   Symptoms  none  none  none  None  None    Comments  Virtual cardiac rehab session.  Pt first day of exercise.   --  None  Pt tolerated exercise well    Duration  --  Continue with 30 min of aerobic exercise without signs/symptoms of physical distress.  Continue with 30 min of aerobic exercise without signs/symptoms of physical distress.  Continue with 30 min of aerobic exercise without signs/symptoms of physical distress.  Continue with 30 min of aerobic exercise without signs/symptoms of physical distress.   Intensity  --  THRR unchanged  THRR unchanged  THRR unchanged  THRR unchanged     Progression   Progression  Continue to progress workloads to maintain intensity without signs/symptoms of physical distress.  Continue to progress workloads to maintain intensity without signs/symptoms of physical distress.  Continue to progress workloads to maintain intensity without signs/symptoms of physical distress.  Continue to progress workloads to maintain intensity without signs/symptoms of physical distress.  Continue to progress workloads to maintain intensity without signs/symptoms of physical distress.   Average METs  --  2.3  2  2.3  2.2     Resistance Training   Training Prescription  --  Yes  Yes  Yes  Yes   Weight  --  3 lbs.   4 lbs.   4 lbs.   4 lbs.    Reps  --  10-15  10-15  10-15  10-15   Time  --  10 Minutes  10 Minutes  10 Minutes  10 Minutes     Interval Training   Interval Training  --  No  No  No  No     Recumbant Bike   Level  --  --  3  3  3    Minutes  --  --  15  15  15    METs  --  --  2.1  2.2  2.3     NuStep   Level  --  2  3  4  5    SPM  --  85  85  95  95   Minutes  --  30  30  15  15    METs  --  2.3  1.9  2.4  2.4     Track   Minutes  40  --  --  --  --     Home Exercise Plan   Plans to continue exercise at  Home (comment) Walking  Home (comment) Walking  Home (comment) Walking  Home (comment) Walking  Home (comment) Walking   Frequency  --  --  --  Add 2 additional days to program exercise sessions.  Add  2 additional days to program exercise sessions.   Initial Home Exercises Provided  01/06/20  01/06/20  01/06/20  01/06/20  01/06/20      Exercise Comments: Exercise Comments    Row Name 01/18/20 [id number] 01/28/20 1127 02/14/20 0900 03/16/20 1343     Exercise Comments  Patient will be contacted  to begin participation in the onsite cardiac rehab program after temporary closure due to COVID-19 pandemic.  Pt first day of exercise. Pt tolerated exercise well.  Reviewed HEP with Pt. Pt understands goals.  Reveiwed home exercise adherence with pt. Pt is not exercising at home. States he is active, but not exericising. Encouraged pt to try and walk 2 days in addition to cardiac rehab. Pt will be graduating soon and will need a plan to continue exercise. Pt does well with exericse. Will continue to monitor.       Exercise Goals and Review: Exercise Goals    Row Name 01/06/20 0933             Exercise Goals   Increase Physical Activity  Yes       Intervention  Provide advice, education, support and counseling about physical activity/exercise needs.;Develop an individualized exercise prescription for aerobic and resistive training based on initial evaluation findings, risk stratification, comorbidities and participant's personal goals.       Expected Outcomes  Short Term: Attend rehab on a regular basis to increase amount of physical activity.;Long Term: Exercising regularly at least 3-5 days a week.;Long Term: Add in home exercise to make exercise part of routine and to increase amount of physical activity.       Increase Strength and Stamina  Yes       Intervention  Provide advice, education, support and counseling about physical activity/exercise needs.;Develop an individualized exercise prescription for aerobic and resistive training based on initial evaluation findings, risk stratification, comorbidities and participant's personal goals.       Expected Outcomes  Short Term: Increase workloads from  initial exercise prescription for resistance, speed, and METs.;Short Term: Perform resistance training exercises routinely during rehab and add in resistance training at home;Long Term: Improve cardiorespiratory fitness, muscular endurance and strength as measured by increased METs and functional capacity ( )       Able to understand and use rate of perceived exertion (RPE) scale  Yes       Intervention  Provide education and explanation on how to use RPE scale       Expected Outcomes  Short Term: Able to use RPE daily in rehab to express subjective intensity level;Long Term:  Able to use RPE to guide intensity level when exercising independently       Knowledge and understanding of Target Heart Rate Range (THRR)  Yes       Intervention  Provide education and explanation of THRR including how the numbers were predicted and where they are located for reference       Expected Outcomes  Short Term: Able to state/look up THRR;Long Term: Able to use THRR to govern intensity when exercising independently;Short Term: Able to use daily as guideline for intensity in rehab       Able to check pulse independently  Yes       Intervention  Provide education and demonstration on how to check pulse in carotid and radial arteries.;Review the importance of being able to check your own pulse for safety during independent exercise       Expected Outcomes  Short Term: Able to explain why pulse checking is important during independent exercise;Long Term: Able to check pulse independently and accurately       Understanding of Exercise Prescription  Yes       Intervention  Provide education, explanation, and written materials on patient's individual exercise prescription       Expected Outcomes  Short  Term: Able to explain program exercise prescription;Long Term: Able to explain home exercise prescription to exercise independently          Exercise Goals Re-Evaluation : Exercise Goals Re-Evaluation    Row Name  01/18/20 0926 01/28/20 1124 02/14/20 0900 03/16/20 1342       Exercise Goal Re-Evaluation   Exercise Goals Review  --  Increase Physical Activity;Increase Strength and Stamina;Able to understand and use rate of perceived exertion (RPE) scale;Knowledge and understanding of Target Heart Rate Range (THRR);Understanding of Exercise Prescription  Increase Physical Activity;Increase Strength and Stamina;Able to understand and use rate of perceived exertion (RPE) scale;Knowledge and understanding of Target Heart Rate Range (THRR);Able to check pulse independently;Understanding of Exercise Prescription  Increase Physical Activity;Increase Strength and Stamina;Able to understand and use rate of perceived exertion (RPE) scale;Knowledge and understanding of Target Heart Rate Range (THRR);Able to check pulse independently;Understanding of Exercise Prescription    Comments  Patient has been actively participating in the virtual cardiac rehab program via the [employer] app and has been progressing well. Patient is walking 20-40 minutes, 5-6 days/week.  Pt first day of exercise in CR program. Pt tolerated exercise well but is unable to perform the treadmill. Pt will continue with just the seated stepper until stamina increases. Pt understands THRR, RPE scale, and exercise Rx.  Reviewed HEP with Pt. Pt is currently wlaking 40 minutes per day 2-4 days per week in addition to CR program. Spoke to Pt about THRR, RPE scale, weather precautions, end points of exercise, and pulse counting. Pt expressed understanding of all.  Pt is continuing to respond well to exercise. Pt has not implemented home exercise. Will continue to encourage.    Expected Outcomes  Patient will transition to the onsite cardiac rehab program.  Will continue to monitor and progress Pt as tolerated.  Will continue to monitor and porgress Pt as tolerated.  Will continue to monitor and progress pt.       Discharge Exercise Prescription (Final Exercise  Prescription Changes): Exercise Prescription Changes - 03/10/20 1339      Response to Exercise   Blood Pressure (Admit)  104/56    Blood Pressure (Exercise)  132/80    Blood Pressure (Exit)  102/58    Heart Rate (Admit)  89 bpm    Heart Rate (Exercise)  98 bpm    Heart Rate (Exit)  88 bpm    Rating of Perceived Exertion (Exercise)  12    Perceived Dyspnea (Exercise)  0    Symptoms  None    Comments  Pt tolerated exercise well     Duration  Continue with 30 min of aerobic exercise without signs/symptoms of physical distress.    Intensity  THRR unchanged      Progression   Progression  Continue to progress workloads to maintain intensity without signs/symptoms of physical distress.    Average METs  2.2      Resistance Training   Training Prescription  Yes    Weight  4 lbs.     Reps  10-15    Time  10 Minutes      Interval Training   Interval Training  No      Recumbant Bike   Level  3    Minutes  15    METs  2.3      NuStep   Level  5    SPM  95    Minutes  15    METs  2.4  Home Exercise Plan   Plans to continue exercise at  Home (comment)   Walking   Frequency  Add 2 additional days to program exercise sessions.    Initial Home Exercises Provided  01/06/20       Nutrition:  Target Goals: Understanding of nutrition guidelines, daily intake of sodium 1500mg , cholesterol 200mg , calories 30% from fat and 7% or less from saturated fats, daily to have 5 or more servings of fruits and vegetables.  Biometrics: Pre Biometrics - 01/06/20 0925      Pre Biometrics   Height  5' 7.25" (1.708 m)    Weight  91.2 kg    Waist Circumference  42 inches    Hip Circumference  43.75 inches    Waist to Hip Ratio  0.96 %    BMI (Calculated)  31.26    Triceps Skinfold  7 mm    % Body Fat  27.2 %    Grip Strength  27.5 kg    Flexibility  14 in    Single Leg Stand  5.12 seconds      Post Biometrics - 03/13/20 1035       Post  Biometrics   Height  5' 7.25" (1.708 m)     Weight  92.8 kg    Waist Circumference  43 inches    Hip Circumference  44 inches    Waist to Hip Ratio  0.98 %    BMI (Calculated)  31.81    Triceps Skinfold  6 mm    % Body Fat  27.3 %    Grip Strength  46 kg    Flexibility  15 in    Single Leg Stand  6.06 seconds       Nutrition Therapy Plan and Nutrition Goals: Nutrition Therapy & Goals - 01/26/20 1202      Nutrition Therapy   Diet  Heart Healthy/carb modified    Drug/Food Interactions  Statins/Certain Fruits      Personal Nutrition Goals   Nutrition Goal  CBG concentrations in the normal range or as close to normal as is safely possible.    Personal Goal #2  Improved blood glucose control as evidenced by pt's A1c trending from 9.0 toward less than 7.0.    Personal Goal #3  Pt able to name foods that affect blood glucose      Intervention Plan   Intervention  Nutrition handout(s) given to patient.;Prescribe, educate and counsel regarding individualized specific dietary modifications aiming towards targeted core components such as weight, hypertension, lipid management, diabetes, heart failure and other comorbidities.    Expected Outcomes  Short Term Goal: A plan has been developed with personal nutrition goals set during dietitian appointment.;Long Term Goal: Adherence to prescribed nutrition plan.       Nutrition Assessments: Nutrition Assessments - 02/16/20 0936      MEDFICTS Scores   Pre Score  36       Nutrition Goals Re-Evaluation: Nutrition Goals Re-Evaluation    Row Name 01/26/20 1203 03/14/20 0712           Goals   Current Weight  201 lb (91.2 kg)  204 lb (92.5 kg)      Nutrition Goal  CBG concentrations in the normal range or as close to normal as is safely possible.  CBG concentrations in the normal range or as close to normal as is safely possible.        Personal Goal #2 Re-Evaluation   Personal Goal #2  Improved blood  glucose control as evidenced by pt's A1c trending from 9.0 toward less than  7.0.  Improved blood glucose control as evidenced by pt's A1c trending from 9.0 toward less than 7.0.        Personal Goal #3 Re-Evaluation   Personal Goal #3  Pt able to name foods that affect blood glucose  Pt able to name foods that affect blood glucose         Nutrition Goals Re-Evaluation: Nutrition Goals Re-Evaluation    Row Name 01/26/20 1203 03/14/20 0712           Goals   Current Weight  201 lb (91.2 kg)  204 lb (92.5 kg)      Nutrition Goal  CBG concentrations in the normal range or as close to normal as is safely possible.  CBG concentrations in the normal range or as close to normal as is safely possible.        Personal Goal #2 Re-Evaluation   Personal Goal #2  Improved blood glucose control as evidenced by pt's A1c trending from 9.0 toward less than 7.0.  Improved blood glucose control as evidenced by pt's A1c trending from 9.0 toward less than 7.0.        Personal Goal #3 Re-Evaluation   Personal Goal #3  Pt able to name foods that affect blood glucose  Pt able to name foods that affect blood glucose         Nutrition Goals Discharge (Final Nutrition Goals Re-Evaluation): Nutrition Goals Re-Evaluation - 03/14/20 2130      Goals   Current Weight  204 lb (92.5 kg)    Nutrition Goal  CBG concentrations in the normal range or as close to normal as is safely possible.      Personal Goal #2 Re-Evaluation   Personal Goal #2  Improved blood glucose control as evidenced by pt's A1c trending from 9.0 toward less than 7.0.      Personal Goal #3 Re-Evaluation   Personal Goal #3  Pt able to name foods that affect blood glucose       Psychosocial: Target Goals: Acknowledge presence or absence of significant depression and/or stress, maximize coping skills, provide positive support system. Participant is able to verbalize types and ability to use techniques and skills needed for reducing stress and depression.  Initial Review & Psychosocial Screening: Initial Psych Review  & Screening - 01/06/20 0952      Initial Review   Current issues with  None Identified      Family Dynamics   Good Support System?  Yes   Fred [name] has his wife for support     Barriers   Psychosocial barriers to participate in program  There are no identifiable barriers or psychosocial needs.      Screening Interventions   Interventions  Encouraged to exercise       Quality of Life Scores: Quality of Life - 01/06/20 1022      Quality of Life   Select  Quality of Life      Quality of Life Scores   Health/Function Pre  25.93 %    Socioeconomic Pre  28.19 %    Psych/Spiritual Pre  28.57 %    Family Pre  26.4 %    GLOBAL Pre  27.04 %      Scores of 19 and below usually indicate a poorer quality of life in these areas.  A difference of  2-3 points is a clinically meaningful difference.  A difference of  2-3 points in the total score of the Quality of Life Index has been associated with significant improvement in overall quality of life, self-image, physical symptoms, and general health in studies assessing change in quality of life.  PHQ-9: Recent Review Flowsheet Data    Depression screen [facility] 2/9 01/06/2020   Decreased Interest 0   Down, Depressed, Hopeless 0   PHQ - 2 Score 0     Interpretation of Total Score  Total Score Depression Severity:  1-4 = Minimal depression, 5-9 = Mild depression, 10-14 = Moderate depression, 15-19 = Moderately severe depression, 20-27 = Severe depression   Psychosocial Evaluation and Intervention: Psychosocial Evaluation - 01/28/20 1121      Psychosocial Evaluation & Interventions   Interventions  Encouraged to exercise with the program and follow exercise prescription    Comments  Fred [name] does not report any psychosocial barriers. He enjoys working with tools and home repairs.    Expected Outcomes  Fred [name] will maintain a positive outlook with good coping skills.    Continue Psychosocial Services   No Follow up required       Psychosocial  Re-Evaluation: Psychosocial Re-Evaluation    Row Name 01/17/20 1220 02/11/20 1148 03/10/20 1551         Psychosocial Re-Evaluation   Current issues with  None Identified  None Identified  None Identified     Comments  --  No psychosocial interventions necessary.  No psychosocial interventions necessary.     Expected Outcomes  --  Fred [name] will maintain a positive outlook with good coping skills.  Zohan will maintain a positive outlook with good coping skills.     Interventions  Encouraged to attend Cardiac Rehabilitation for the exercise  Encouraged to attend Cardiac Rehabilitation for the exercise  Encouraged to attend Cardiac Rehabilitation for the exercise     Continue Psychosocial Services   No Follow up required  No Follow up required  No Follow up required        Psychosocial Discharge (Final Psychosocial Re-Evaluation): Psychosocial Re-Evaluation - 03/10/20 1551      Psychosocial Re-Evaluation   Current issues with  None Identified    Comments  No psychosocial interventions necessary.    Expected Outcomes  Natasha will maintain a positive outlook with good coping skills.    Interventions  Encouraged to attend Cardiac Rehabilitation for the exercise    Continue Psychosocial Services   No Follow up required       Vocational Rehabilitation: Provide vocational rehab assistance to qualifying candidates.   Vocational Rehab Evaluation & Intervention: Vocational Rehab - 01/06/20 0953      Initial Vocational Rehab Evaluation & Intervention   Assessment shows need for Vocational Rehabilitation  No   Fred [name] is retired and does not need vocational rehab at this time      Education: Education Goals: Education classes will be provided on a weekly basis, covering required topics. Participant will state understanding/return demonstration of topics presented.  Learning Barriers/Preferences: Learning Barriers/Preferences - 01/06/20 1155      Learning Barriers/Preferences   Learning  Barriers  Sight;Hearing   Wears reading glasses. Deaf in right ear.   Learning Preferences  Skilled Demonstration       Education Topics: Count Your Pulse:  -Group instruction provided by verbal instruction, demonstration, patient participation and written materials to support subject.  Instructors address importance of being able to find your pulse and how to count your pulse when at home without a heart monitor.  Patients get hands on  experience counting their pulse with staff help and individually.   Heart Attack, Angina, and Risk Factor Modification:  -Group instruction provided by verbal instruction, video, and written materials to support subject.  Instructors address signs and symptoms of angina and heart attacks.    Also discuss risk factors for heart disease and how to make changes to improve heart health risk factors.   Functional Fitness:  -Group instruction provided by verbal instruction, demonstration, patient participation, and written materials to support subject.  Instructors address safety measures for doing things around the house.  Discuss how to get up and down off the floor, how to pick things up properly, how to safely get out of a chair without assistance, and balance training.   Meditation and Mindfulness:  -Group instruction provided by verbal instruction, patient participation, and written materials to support subject.  Instructor addresses importance of mindfulness and meditation practice to help reduce stress and improve awareness.  Instructor also leads participants through a meditation exercise.    Stretching for Flexibility and Mobility:  -Group instruction provided by verbal instruction, patient participation, and written materials to support subject.  Instructors lead participants through series of stretches that are designed to increase flexibility thus improving mobility.  These stretches are additional exercise for major muscle groups that are typically  performed during regular warm up and cool down.   Hands Only CPR:  -Group verbal, video, and participation provides a basic overview of AHA guidelines for community CPR. Role-play of emergencies allow participants the opportunity to practice calling for help and chest compression technique with discussion of AED use.   Hypertension: -Group verbal and written instruction that provides a basic overview of hypertension including the most recent diagnostic guidelines, risk factor reduction with self-care instructions and medication management.    Nutrition I class: Heart Healthy Eating:  -Group instruction provided by PowerPoint slides, verbal discussion, and written materials to support subject matter. The instructor gives an explanation and review of the Therapeutic Lifestyle Changes diet recommendations, which includes a discussion on lipid goals, dietary fat, sodium, fiber, plant stanol/sterol esters, sugar, and the components of a well-balanced, healthy diet.   Nutrition II class: Lifestyle Skills:  -Group instruction provided by PowerPoint slides, verbal discussion, and written materials to support subject matter. The instructor gives an explanation and review of label reading, grocery shopping for heart health, heart healthy recipe modifications, and ways to make healthier choices when eating out.   Diabetes Question & Answer:  -Group instruction provided by PowerPoint slides, verbal discussion, and written materials to support subject matter. The instructor gives an explanation and review of diabetes co-morbidities, pre- and post-prandial blood glucose goals, pre-exercise blood glucose goals, signs, symptoms, and treatment of hypoglycemia and hyperglycemia, and foot care basics.   Diabetes Blitz:  -Group instruction provided by PowerPoint slides, verbal discussion, and written materials to support subject matter. The instructor gives an explanation and review of the physiology behind  type 1 and type 2 diabetes, diabetes medications and rational behind using different medications, pre- and post-prandial blood glucose recommendations and Hemoglobin A1c goals, diabetes diet, and exercise including blood glucose guidelines for exercising safely.    Portion Distortion:  -Group instruction provided by PowerPoint slides, verbal discussion, written materials, and food models to support subject matter. The instructor gives an explanation of serving size versus portion size, changes in portions sizes over the last 20 years, and what consists of a serving from each food group.   Stress Management:  -Group instruction provided by  verbal instruction, video, and written materials to support subject matter.  Instructors review role of stress in heart disease and how to cope with stress positively.     Exercising on Your Own:  -Group instruction provided by verbal instruction, power point, and written materials to support subject.  Instructors discuss benefits of exercise, components of exercise, frequency and intensity of exercise, and end points for exercise.  Also discuss use of nitroglycerin and activating EMS.  Review options of places to exercise outside of rehab.  Review guidelines for sex with heart disease.   Cardiac Drugs I:  -Group instruction provided by verbal instruction and written materials to support subject.  Instructor reviews cardiac drug classes: antiplatelets, anticoagulants, beta blockers, and statins.  Instructor discusses reasons, side effects, and lifestyle considerations for each drug class.   Cardiac Drugs II:  -Group instruction provided by verbal instruction and written materials to support subject.  Instructor reviews cardiac drug classes: angiotensin converting enzyme inhibitors (ACE-I), angiotensin II receptor blockers (ARBs), nitrates, and calcium channel blockers.  Instructor discusses reasons, side effects, and lifestyle considerations for each drug  class.   Anatomy and Physiology of the Circulatory System:  Group verbal and written instruction and models provide basic cardiac anatomy and physiology, with the coronary electrical and arterial systems. Review of: AMI, Angina, Valve disease, Heart Failure, Peripheral Artery Disease, Cardiac Arrhythmia, Pacemakers, and the ICD.   Other Education:  -Group or individual verbal, written, or video instructions that support the educational goals of the cardiac rehab program.   Holiday Eating Survival Tips:  -Group instruction provided by PowerPoint slides, verbal discussion, and written materials to support subject matter. The instructor gives patients tips, tricks, and techniques to help them not only survive but enjoy the holidays despite the onslaught of food that accompanies the holidays.   Knowledge Questionnaire Score: Knowledge Questionnaire Score - 01/06/20 1026      Knowledge Questionnaire Score   Pre Score  20/24       Core Components/Risk Factors/Patient Goals at Admission: Personal Goals and Risk Factors at Admission - 01/06/20 1113      Core Components/Risk Factors/Patient Goals on Admission    Weight Management  Yes;Obesity    Intervention  Weight Management/Obesity: Establish reasonable short term and long term weight goals.;Obesity: Provide education and appropriate resources to help participant work on and attain dietary goals.    Admit Weight  201 lb 1 oz (91.2 kg)    Expected Outcomes  Short Term: Continue to assess and modify interventions until short term weight is achieved;Long Term: Adherence to nutrition and physical activity/exercise program aimed toward attainment of established weight goal;Weight Loss: Understanding of general recommendations for a balanced deficit meal plan, which promotes 1-2 lb weight loss per week and includes a negative energy balance of [phone] kcal/d    Diabetes  Yes    Intervention  Provide education about signs/symptoms and action to  take for hypo/hyperglycemia.;Provide education about proper nutrition, including hydration, and aerobic/resistive exercise prescription along with prescribed medications to achieve blood glucose in normal ranges: Fasting glucose 65-99 mg/dL    Expected Outcomes  Short Term: Participant verbalizes understanding of the signs/symptoms and immediate care of hyper/hypoglycemia, proper foot care and importance of medication, aerobic/resistive exercise and nutrition plan for blood glucose control.;Long Term: Attainment of HbA1C < 7%.    Lipids  Yes    Intervention  Provide education and support for participant on nutrition & aerobic/resistive exercise along with prescribed medications to achieve LDL 70mg , HDL >40mg .  Expected Outcomes  Short Term: Participant states understanding of desired cholesterol values and is compliant with medications prescribed. Participant is following exercise prescription and nutrition guidelines.;Long Term: Cholesterol controlled with medications as prescribed, with individualized exercise RX and with personalized nutrition plan. Value goals: LDL < 70mg , HDL > 40 mg.       Core Components/Risk Factors/Patient Goals Review:  Goals and Risk Factor Review    Row Name 01/17/20 1223 01/28/20 1126 02/11/20 1150 03/10/20 1552       Core Components/Risk Factors/Patient Goals Review   Personal Goals Review  Weight Management/Obesity;Diabetes;Lipids  Weight Management/Obesity;Diabetes;Lipids  Weight Management/Obesity;Diabetes;Lipids  Weight Management/Obesity;Diabetes;Lipids    Review  Fred [name] is doing well with exercise and is documenting his exercise on the virtual cardiac rehab [facility] APP. Fred [name] ahs been documenting his vital signs. Fred [name]'s vital signs  Pt with multiple CAD RFs willing to participate in CR exercise.  Fred [name] would like to increase his strength and stamina.  Pt with multiple CAD RFs willing to participate in CR exercise.  Fred [name] feels that he is decreasing his  soreness and stiffness.  Pt with multiple CAD RFs willing to participate in CR exercise.  Fred [name] feels that he is increasing his strength.  He is tolerating exercise well.    Expected Outcomes  Patient will continue to participate in virtual cardiac rehab for nutrtion, exercise and lifestyle modifications  Patient will continue to participate in cardiac rehab for nutrtion, exercise and lifestyle modification opportunities.  Patient will continue to participate in cardiac rehab for nutrtion, exercise and lifestyle modification opportunities.  Patient will continue to participate in cardiac rehab for nutrtion, exercise and lifestyle modification opportunities.       Core Components/Risk Factors/Patient Goals at Discharge (Final Review):  Goals and Risk Factor Review - 03/10/20 1552      Core Components/Risk Factors/Patient Goals Review   Personal Goals Review  Weight Management/Obesity;Diabetes;Lipids    Review  Pt with multiple CAD RFs willing to participate in CR exercise.  Fred [name] feels that he is increasing his strength.  He is tolerating exercise well.    Expected Outcomes  Patient will continue to participate in cardiac rehab for nutrtion, exercise and lifestyle modification opportunities.       ITP Comments: ITP Comments    Row Name 01/06/20 0919 01/17/20 1219 01/28/20 1056 02/11/20 1141 03/10/20 1550   ITP Comments  Medical Director- Dr. 05/10/20, MD  30 Day ITP Review. Fred [name] is doing well with virtual cardiac rehab via the [facility] APP  Fred [name] started exercise today and tolerated it well.  BP stable though his CBG decreased to 71 at the end of exercise.  Pt's breakfast complete with both carbs and protein as recommended by dietitian.  Fred [name] continues to tolerate exercise well.  His CBG has stabilized with exercise.  Laker reports that with his participation in exercise he feels less sore and stiff.  30 Day ITP Review.  Fred [name] continues to tolerate exercise well.  He feels that he is  getting stronger.      Comments: See ITP Comments.

## 2020-03-17 ENCOUNTER — Other Ambulatory Visit: Payer: Self-pay

## 2020-03-17 ENCOUNTER — Encounter (HOSPITAL_COMMUNITY)
Admission: RE | Admit: 2020-03-17 | Discharge: 2020-03-17 | Disposition: A | Discharge status: Home / Self Care | Payer: Medicare Other | Source: Ambulatory Visit | Attending: Cardiovascular Disease | Admitting: Cardiovascular Disease

## 2020-03-17 DIAGNOSIS — Z951 Presence of aortocoronary bypass graft: Secondary | ICD-10-CM

## 2020-03-17 DIAGNOSIS — I2510 Atherosclerotic heart disease of native coronary artery without angina pectoris: Secondary | ICD-10-CM | POA: Diagnosis not present

## 2020-03-20 ENCOUNTER — Other Ambulatory Visit: Payer: Self-pay

## 2020-03-20 ENCOUNTER — Encounter (HOSPITAL_COMMUNITY)
Admission: RE | Admit: 2020-03-20 | Discharge: 2020-03-20 | Disposition: A | Discharge status: Home / Self Care | Payer: Medicare Other | Source: Ambulatory Visit | Attending: Cardiovascular Disease | Admitting: Cardiovascular Disease

## 2020-03-20 DIAGNOSIS — Z951 Presence of aortocoronary bypass graft: Secondary | ICD-10-CM

## 2020-03-20 DIAGNOSIS — I2510 Atherosclerotic heart disease of native coronary artery without angina pectoris: Secondary | ICD-10-CM | POA: Diagnosis not present

## 2020-03-20 NOTE — Progress Notes (Signed)
Discharge Progress Report  Patient Details  Name: Fred [name] MRN: [id number] Date of Birth: [birth year] Referring Provider:     CARDIAC REHAB PHASE II ORIENTATION from 01/06/2020 in [place]  Referring Provider  Quay Burow, MD       Number of Visits: 22  Reason for Discharge:  Patient reached a stable level of exercise. Patient independent in their exercise. Patient has met program and personal goals.  Smoking History:  Social History   Tobacco Use  Smoking Status Former Smoker  . Years: 10.00  . Types: Cigarettes  . Quit date: 12/1962  . 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  ADL [facility]:   Initial Exercise Prescription: Initial Exercise Prescription - 01/06/20 1000      Date of Initial Exercise RX and Referring Provider   Date  01/06/20    Referring Provider  Quay Burow, MD      Track   Minutes  30      Prescription Details   Frequency (times per week)  5-7    Duration  Progress to 30 minutes of continuous aerobic without signs/symptoms of physical distress      Intensity   THRR 40-80% of Max Heartrate  56-113    Ratings of Perceived Exertion  11-13    Perceived Dyspnea  0-4      Progression   Progression  Continue to progress workloads to maintain intensity without signs/symptoms of physical distress.      Resistance Training   Training Prescription  Yes    Weight  --   orange bands   Reps  10-15       Discharge Exercise Prescription (Final Exercise Prescription Changes): Exercise Prescription Changes - 03/10/20 1339      Response to Exercise   Blood Pressure (Admit)  104/56    Blood Pressure (Exercise)  132/80    Blood Pressure (Exit)  102/58    Heart Rate (Admit)  89 bpm    Heart Rate (Exercise)  98 bpm    Heart Rate (Exit)  88 bpm    Rating of Perceived Exertion (Exercise)  12    Perceived Dyspnea (Exercise)  0    Symptoms  None     Comments  Pt tolerated exercise well     Duration  Continue with 30 min of aerobic exercise without signs/symptoms of physical distress.    Intensity  THRR unchanged      Progression   Progression  Continue to progress workloads to maintain intensity without signs/symptoms of physical distress.    Average METs  2.2      Resistance Training   Training Prescription  Yes    Weight  4 lbs.     Reps  10-15    Time  10 Minutes      Interval Training   Interval Training  No      Recumbant Bike   Level  3    Minutes  15    METs  2.3      NuStep   Level  5    SPM  95    Minutes  15    METs  2.4      Home Exercise Plan   Plans to continue exercise at  Home (comment)   Walking   Frequency  Add 2 additional days to program exercise sessions.    Initial Home Exercises Provided  01/06/20  Functional Capacity: 6 Minute Walk    Row Name 01/06/20 0942 03/13/20 1034       6 Minute Walk   Phase  Initial  Discharge    Distance  1515 feet  1600 feet    Distance % Change  --  5.61 %    Distance Feet Change  --  85 ft    Walk Time  6 minutes  6 minutes    # of Rest Breaks  0  0    MPH  2.87  3    METS  2.43  2.6    RPE  11  11    Perceived Dyspnea   0  0    VO2 Peak  8.5  9.1    Symptoms  No  No    Resting HR  76 bpm  82 bpm    Resting BP  112/68  104/72    Resting Oxygen Saturation   97 %  --    Exercise Oxygen Saturation  during 6 min walk  97 %  --    Max Ex. HR  100 bpm  100 bpm    Max Ex. BP  112/58  120/60    2 Minute Post BP  108/58  100/64       Psychological, QOL, Others - Outcomes: PHQ 2/9: Depression screen PHQ 2/9 01/06/2020  Decreased Interest 0  Down, Depressed, Hopeless 0  PHQ - 2 Score 0    Quality of Life: Quality of Life - 03/20/20 0737      Quality of Life Scores   Health/Function Pre  25.93 %    Health/Function Post  27.37 %    Health/Function % Change  5.55 %    Socioeconomic Pre  28.19 %    Socioeconomic Post  27.35 %     Socioeconomic % Change   -2.98 %    Psych/Spiritual Pre  28.57 %    Psych/Spiritual Post  28.07 %    Psych/Spiritual % Change  -1.75 %    Family Pre  26.4 %    Family Post  30 %    Family % Change  13.64 %    GLOBAL Pre  27.04 %    GLOBAL Post  27.97 %    GLOBAL % Change  3.44 %       Personal Goals: Goals established at orientation with interventions provided to work toward goal. Personal Goals and Risk Factors at Admission - 01/06/20 1113      Core Components/Risk Factors/Patient Goals on Admission    Weight Management  Yes;Obesity    Intervention  Weight Management/Obesity: Establish reasonable short term and long term weight goals.;Obesity: Provide education and appropriate resources to help participant work on and attain dietary goals.    Admit Weight  201 lb 1 oz (91.2 kg)    Expected Outcomes  Short Term: Continue to assess and modify interventions until short term weight is achieved;Long Term: Adherence to nutrition and physical activity/exercise program aimed toward attainment of established weight goal;Weight Loss: Understanding of general recommendations for a balanced deficit meal plan, which promotes 1-2 lb weight loss per week and includes a negative energy balance of [phone] kcal/d    Diabetes  Yes    Intervention  Provide education about signs/symptoms and action to take for hypo/hyperglycemia.;Provide education about proper nutrition, including hydration, and aerobic/resistive exercise prescription along with prescribed medications to achieve blood glucose in normal ranges: Fasting glucose 65-99 mg/dL    Expected Outcomes  Short Term: Participant verbalizes understanding of the signs/symptoms and immediate care of hyper/hypoglycemia, proper foot care and importance of medication, aerobic/resistive exercise and nutrition plan for blood glucose control.;Long Term: Attainment of HbA1C < 7%.    Lipids  Yes    Intervention  Provide education and support for participant on  nutrition & aerobic/resistive exercise along with prescribed medications to achieve LDL '70mg'$ , HDL >'40mg'$ .    Expected Outcomes  Short Term: Participant states understanding of desired cholesterol values and is compliant with medications prescribed. Participant is following exercise prescription and nutrition guidelines.;Long Term: Cholesterol controlled with medications as prescribed, with individualized exercise RX and with personalized nutrition plan. Value goals: LDL < '70mg'$ , HDL > 40 mg.        Personal Goals Discharge: Goals and Risk Factor Review    Row Name 01/28/20 1126 02/11/20 1150 03/10/20 1552         Core Components/Risk Factors/Patient Goals Review   Personal Goals Review  Weight Management/Obesity;Diabetes;Lipids  Weight Management/Obesity;Diabetes;Lipids  Weight Management/Obesity;Diabetes;Lipids     Review  Pt with multiple CAD RFs willing to participate in CR exercise.  Fred [name] would like to increase his strength and stamina.  Pt with multiple CAD RFs willing to participate in CR exercise.  Fred [name] feels that he is decreasing his soreness and stiffness.  Pt with multiple CAD RFs willing to participate in CR exercise.  Fred [name] feels that he is increasing his strength.  He is tolerating exercise well.     Expected Outcomes  Patient will continue to participate in cardiac rehab for nutrtion, exercise and lifestyle modification opportunities.  Patient will continue to participate in cardiac rehab for nutrtion, exercise and lifestyle modification opportunities.  Patient will continue to participate in cardiac rehab for nutrtion, exercise and lifestyle modification opportunities.        Exercise Goals and Review: Exercise Goals    Row Name 01/06/20 0933             Exercise Goals   Increase Physical Activity  Yes       Intervention  Provide advice, education, support and counseling about physical activity/exercise needs.;Develop an individualized exercise prescription for aerobic and  resistive training based on initial evaluation findings, risk stratification, comorbidities and participant's personal goals.       Expected Outcomes  Short Term: Attend rehab on a regular basis to increase amount of physical activity.;Long Term: Exercising regularly at least 3-5 days a week.;Long Term: Add in home exercise to make exercise part of routine and to increase amount of physical activity.       Increase Strength and Stamina  Yes       Intervention  Provide advice, education, support and counseling about physical activity/exercise needs.;Develop an individualized exercise prescription for aerobic and resistive training based on initial evaluation findings, risk stratification, comorbidities and participant's personal goals.       Expected Outcomes  Short Term: Increase workloads from initial exercise prescription for resistance, speed, and METs.;Short Term: Perform resistance training exercises routinely during rehab and add in resistance training at home;Long Term: Improve cardiorespiratory fitness, muscular endurance and strength as measured by increased METs and functional capacity (6MWT)       Able to understand and use rate of perceived exertion (RPE) scale  Yes       Intervention  Provide education and explanation on how to use RPE scale       Expected Outcomes  Short Term: Able to use RPE daily in rehab to express subjective  intensity level;Long Term:  Able to use RPE to guide intensity level when exercising independently       Knowledge and understanding of Target Heart Rate Range (THRR)  Yes       Intervention  Provide education and explanation of THRR including how the numbers were predicted and where they are located for reference       Expected Outcomes  Short Term: Able to state/look up THRR;Long Term: Able to use THRR to govern intensity when exercising independently;Short Term: Able to use daily as guideline for intensity in rehab       Able to check pulse independently  Yes        Intervention  Provide education and demonstration on how to check pulse in carotid and radial arteries.;Review the importance of being able to check your own pulse for safety during independent exercise       Expected Outcomes  Short Term: Able to explain why pulse checking is important during independent exercise;Long Term: Able to check pulse independently and accurately       Understanding of Exercise Prescription  Yes       Intervention  Provide education, explanation, and written materials on patient's individual exercise prescription       Expected Outcomes  Short Term: Able to explain program exercise prescription;Long Term: Able to explain home exercise prescription to exercise independently          Exercise Goals Re-Evaluation: Exercise Goals Re-Evaluation    Row Name 01/18/20 0926 01/28/20 1124 02/14/20 0900 03/16/20 1342       Exercise Goal Re-Evaluation   Exercise Goals Review  --  Increase Physical Activity;Increase Strength and Stamina;Able to understand and use rate of perceived exertion (RPE) scale;Knowledge and understanding of Target Heart Rate Range (THRR);Understanding of Exercise Prescription  Increase Physical Activity;Increase Strength and Stamina;Able to understand and use rate of perceived exertion (RPE) scale;Knowledge and understanding of Target Heart Rate Range (THRR);Able to check pulse independently;Understanding of Exercise Prescription  Increase Physical Activity;Increase Strength and Stamina;Able to understand and use rate of perceived exertion (RPE) scale;Knowledge and understanding of Target Heart Rate Range (THRR);Able to check pulse independently;Understanding of Exercise Prescription    Comments  Patient has been actively participating in the virtual cardiac rehab program via the [employer] app and has been progressing well. Patient is walking 20-40 minutes, 5-6 days/week.  Pt first day of exercise in CR program. Pt tolerated exercise well but is unable to  perform the treadmill. Pt will continue with just the seated stepper until stamina increases. Pt understands THRR, RPE scale, and exercise Rx.  Reviewed HEP with Pt. Pt is currently wlaking 40 minutes per day 2-4 days per week in addition to CR program. Spoke to Pt about THRR, RPE scale, weather precautions, end points of exercise, and pulse counting. Pt expressed understanding of all.  Pt is continuing to respond well to exercise. Pt has not implemented home exercise. Will continue to encourage.    Expected Outcomes  Patient will transition to the onsite cardiac rehab program.  Will continue to monitor and progress Pt as tolerated.  Will continue to monitor and porgress Pt as tolerated.  Will continue to monitor and progress pt.       Nutrition & Weight - Outcomes: Pre Biometrics - 01/06/20 0925      Pre Biometrics   Height  5' 7.25" (1.708 m)    Weight  91.2 kg    Waist Circumference  42 inches    Hip  Circumference  43.75 inches    Waist to Hip Ratio  0.96 %    BMI (Calculated)  31.26    Triceps Skinfold  7 mm    % Body Fat  27.2 %    Grip Strength  27.5 kg    Flexibility  14 in    Single Leg Stand  5.12 seconds      Post Biometrics - 03/13/20 1035       Post  Biometrics   Height  5' 7.25" (1.708 m)    Weight  92.8 kg    Waist Circumference  43 inches    Hip Circumference  44 inches    Waist to Hip Ratio  0.98 %    BMI (Calculated)  31.81    Triceps Skinfold  6 mm    % Body Fat  27.3 %    Grip Strength  46 kg    Flexibility  15 in    Single Leg Stand  6.06 seconds       Nutrition: Nutrition Therapy & Goals - 01/26/20 1202      Nutrition Therapy   Diet  Heart Healthy/carb modified    Drug/Food Interactions  Statins/Certain Fruits      Personal Nutrition Goals   Nutrition Goal  CBG concentrations in the normal range or as close to normal as is safely possible.    Personal Goal #2  Improved blood glucose control as evidenced by pt's A1c trending from 9.0 toward less  than 7.0.    Personal Goal #3  Pt able to name foods that affect blood glucose      Intervention Plan   Intervention  Nutrition handout(s) given to patient.;Prescribe, educate and counsel regarding individualized specific dietary modifications aiming towards targeted core components such as weight, hypertension, lipid management, diabetes, heart failure and other comorbidities.    Expected Outcomes  Short Term Goal: A plan has been developed with personal nutrition goals set during dietitian appointment.;Long Term Goal: Adherence to prescribed nutrition plan.       Nutrition Discharge: Nutrition Assessments - 02/16/20 0936      MEDFICTS Scores   Pre Score  36       Education Questionnaire Score: Knowledge Questionnaire Score - 03/20/20 0737      Knowledge Questionnaire Score   Pre Score  20/24    Post Score  20/24       Goals reviewed with patient; copy given to patient. Pt graduated from cardiac rehab program today with completion of 22 exercise sessions in Phase II. Pt maintained good attendance and progressed nicely during his participation in rehab as evidenced by increased MET level.   Medication list reconciled. Repeat  PHQ score-  0.  Pt has made significant lifestyle changes and should be commended for his success.  Fred [name] increased his distance on his post exercise walk test. Pt feels he has achieved his goals during cardiac rehab.   Pt plans to continue exercise by walking.Barnet Pall, RN,BSN 04/07/2020 2:15 PM

## 2020-03-21 ENCOUNTER — Ambulatory Visit (INDEPENDENT_AMBULATORY_CARE_PROVIDER_SITE_OTHER): Payer: Medicare Other | Admitting: Internal Medicine

## 2020-03-21 ENCOUNTER — Encounter: Payer: Self-pay | Admitting: Internal Medicine

## 2020-03-21 VITALS — BP 110/58 | HR 77 | Temp 97.7°F | Ht 68.0 in | Wt 204.4 lb

## 2020-03-21 DIAGNOSIS — Z794 Long term (current) use of insulin: Secondary | ICD-10-CM | POA: Diagnosis not present

## 2020-03-21 DIAGNOSIS — E1159 Type 2 diabetes mellitus with other circulatory complications: Secondary | ICD-10-CM | POA: Diagnosis not present

## 2020-03-21 DIAGNOSIS — R739 Hyperglycemia, unspecified: Secondary | ICD-10-CM | POA: Diagnosis not present

## 2020-03-21 DIAGNOSIS — E119 Type 2 diabetes mellitus without complications: Secondary | ICD-10-CM | POA: Insufficient documentation

## 2020-03-21 DIAGNOSIS — E039 Hypothyroidism, unspecified: Secondary | ICD-10-CM

## 2020-03-21 DIAGNOSIS — E1142 Type 2 diabetes mellitus with diabetic polyneuropathy: Secondary | ICD-10-CM

## 2020-03-21 DIAGNOSIS — E1165 Type 2 diabetes mellitus with hyperglycemia: Secondary | ICD-10-CM

## 2020-03-21 LAB — T4, FREE: Free T4: 0.81 ng/dL (ref 0.60–1.60)

## 2020-03-21 LAB — BASIC METABOLIC PANEL
BUN: 23 mg/dL (ref 6–23)
CO2: 26 mEq/L (ref 19–32)
Calcium: 9.4 mg/dL (ref 8.4–10.5)
Chloride: 100 mEq/L (ref 96–112)
Creatinine, Ser: 1.06 mg/dL (ref 0.40–1.50)
GFR: 67.24 mL/min (ref >60.00)
Glucose, Bld: 216 mg/dL — ABNORMAL HIGH (ref 70–99)
Potassium: 4.3 mEq/L (ref 3.5–5.1)
Sodium: 133 mEq/L — ABNORMAL LOW (ref 135–145)

## 2020-03-21 LAB — TSH: TSH: 4.79 u[IU]/mL — ABNORMAL HIGH (ref 0.35–4.50)

## 2020-03-21 MED ORDER — FARXIGA 5 MG PO TABS: 5.0000 mg | ORAL_TABLET | Freq: Every day | ORAL | 4 refills | Status: DC

## 2020-03-21 NOTE — Progress Notes (Signed)
Name: Fred [name]  MRN/ DOB: [id number], 02/20/1941   Age/ Sex: 79 y.o., male    PCP: [name], MD   Reason for Endocrinology Evaluation: Type 2 Diabetes Mellitus     Date of Initial Endocrinology Visit: 03/21/2020     PATIENT IDENTIFIER: Fred [name] is a 79 y.o. male with a past medical history of HTN, T2DM,CAD and Dyslipidemia. The patient presented for initial endocrinology clinic visit on 03/21/2020 for consultative assistance with his diabetes management.    HPI: Fred [name] was    Diagnosed with DM several years ago  Prior Medications tried/Intolerance: As listed Currently checking blood sugars 1 x / day,  before breakfast  Hypoglycemia episodes : yes- rare      Hemoglobin A1c has ranged from 7.8% in 2011, peaking at 9.0% in 2020. Patient required assistance for hypoglycemia: no  Patient has required hospitalization within the last 1 year from hyper or hypoglycemia: no   In terms of diet, the patient eats meals , snacks at night . Avoids sugar-sweetened beverages   Snacks on cookies, or peanut butter   Thyroid History :   He was noted to have an elevated TSH at 10.42 uIU/mL . He was started on LT-4 replacement in 02/2020.    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 Coronary artery disease   . Diabetes mellitus without complication (HCC)   . High cholesterol   . Vertigo    Past Surgical History:  Past Surgical History:  Procedure Laterality Date  . CARDIAC CATHETERIZATION    . CORONARY ARTERY BYPASS  GRAFT N/A 10/12/2019   Procedure: CORONARY ARTERY BYPASS GRAFTING (CABG) x 4 (LIMA to LAD, SVG to OM, SVG to RAMUS INTERMEDIATE, SVG to PDA) with EVH from RIGHT GREATER SAPHENOUS VEIN and LEFT INTERNAL MAMMARY ARTERY HARVEST;  Surgeon: Kerin Perna, MD;  Location: [facility] OR;  Service: Open Heart Surgery;  Laterality: N/A;  . FOOT SURGERY    . KNEE SURGERY    . LEFT HEART CATH AND CORONARY ANGIOGRAPHY N/A 10/11/2019   Procedure: LEFT HEART CATH AND CORONARY ANGIOGRAPHY;  Surgeon: Runell Gess, MD;  Location: [facility] INVASIVE CV LAB;  Service: Cardiovascular;  Laterality: N/A;  . NASAL SEPTUM SURGERY    . TEE WITHOUT CARDIOVERSION N/A 10/12/2019   Procedure: TRANSESOPHAGEAL ECHOCARDIOGRAM (TEE);  Surgeon: [name], Theron Arista, MD;  Location: [facility] OR;  Service: Open Heart Surgery;  Laterality: N/A;      Social History:  reports that he quit smoking about 57 years ago. His smoking use included cigarettes. He quit after 10.00 years of use. He has never used smokeless tobacco. He reports that he does not drink alcohol or use drugs.  Family History: No family history on file.   HOME MEDICATIONS: Allergies as of 03/21/2020   No Known Allergies     Medication List       Accurate as of March 21, 2020  9:06 AM. If you have any questions, ask your nurse or doctor.  Accu-Chek Aviva device by Other route. Use as instructed 2 times daily   aspirin 325 MG EC tablet Take 1 tablet (325 mg total) by mouth daily.   B-D UF III MINI PEN NEEDLES 31G X 5 MM Misc Generic drug: Insulin Pen Needle USE TO CHECK BLOOD SUGARS ONCE A DAY   clotrimazole 1 % cream Commonly known as: LOTRIMIN Apply 1 application topically as needed (Groin).   glimepiride 4 MG tablet Commonly known as: AMARYL Take 4 mg by mouth 2 (two) times daily.   levothyroxine 25 MCG tablet Commonly known as: SYNTHROID Take 25 mcg by mouth every morning.   metFORMIN 1000 MG tablet Commonly known as: GLUCOPHAGE Take 1,000 mg by  mouth 2 (two) times daily with a meal.   metoprolol tartrate 25 MG tablet Commonly known as: LOPRESSOR Take 0.5 tablets (12.5 mg total) by mouth 2 (two) times daily.   multivitamin with minerals tablet Take 1 tablet by mouth daily.   simvastatin 40 MG tablet Commonly known as: ZOCOR Take 40 mg by mouth daily at 6 PM.   solifenacin 5 MG tablet Commonly known as: VESICARE Take 5 mg by mouth daily.   Toujeo SoloStar 300 UNIT/ML Solostar Pen Generic drug: insulin glargine (1 Unit Dial) Inject 60 mg into the skin daily.        ALLERGIES: No Known Allergies   REVIEW OF SYSTEMS: A comprehensive ROS was conducted with the patient and is negative except as per HPI and below:  Review of Systems  Constitutional: Negative for fever and weight loss.  Gastrointestinal: Negative for diarrhea and nausea.      OBJECTIVE:   VITAL SIGNS: BP (!) 110/58 (BP Location: Left Arm, Patient Position: Sitting, Cuff Size: Large)   Pulse 77   Temp 97.7 F (36.5 C)   Ht 5\' 8"  (1.727 m)   Wt 204 lb 6.4 oz (92.7 kg)   SpO2 98%   BMI 31.08 kg/m    PHYSICAL EXAM:  General: Pt appears well and is in NAD  Neck: General: Supple without adenopathy or carotid bruits. Thyroid: Thyroid size normal.  No goiter or nodules appreciated. No thyroid bruit.  Lungs: Clear with good BS bilat with no rales, rhonchi, or wheezes  Heart: RRR with normal S1 and S2 and no gallops; no murmurs; no rub  Abdomen: Normoactive bowel sounds, soft, nontender, without masses or organomegaly palpable  Extremities:  Lower extremities - No pretibial edema. No lesions.  Skin: Normal texture and temperature to palpation. No rash noted.  Neuro: MS is good with appropriate affect, pt is alert and Ox3    DM foot exam: 03/21/2020  The skin of the feet is without sores or ulcerations, plantar callous formation noted The pedal pulses are 2+ on right and 2+ on left. The sensation is decreased  to a screening 5.07, 10 gram  monofilament bilaterally   DATA REVIEWED:  Lab Results  Component Value Date   HGBA1C 9.0 (H) 10/11/2019   HGBA1C (H) 10/24/2010    7.8 (NOTE)                                                                       According to the ADA Clinical Practice Recommendations for 2011, when HbA1c is  used as a screening test:   >=6.5%   Diagnostic of Diabetes Mellitus           (if abnormal result  is confirmed)  5.7-6.4%   Increased risk of developing Diabetes Mellitus  References:Diagnosis and Classification of Diabetes Mellitus,Diabetes XBMW,4132,44(WNUUV 1):S62-S69 and Standards of Medical Care in         Diabetes - 2011,Diabetes OZDG,6440,34  (Suppl 1):S11-S61.   Lab Results  Component Value Date   [facility]  10/24/2010    68        Total Cholesterol/HDL:CHD Risk Coronary Heart Disease Risk Table                     Men   Women  1/2 Average Risk   3.4   3.3  Average Risk       5.0   4.4  2 X Average Risk   9.6   7.1  3 X Average Risk  23.4   11.0        Use the calculated Patient Ratio above and the CHD Risk Table to determine the patient's CHD Risk.        ATP III CLASSIFICATION (LDL):  <100     mg/dL   Optimal  100-129  mg/dL   Near or Above                    Optimal  130-159  mg/dL   Borderline  160-189  mg/dL   High  >190     mg/dL   Very High   CREATININE 1.30 (H) 10/19/2019     Lab Results  Component Value Date   CHOL  10/24/2010    117        ATP III CLASSIFICATION:  <200     mg/dL   Desirable  200-239  mg/dL   Borderline High  >=240    mg/dL   High          HDL 31 (L) 10/24/2010   LDLCALC  10/24/2010    68        Total Cholesterol/HDL:CHD Risk Coronary Heart Disease Risk Table                     Men   Women  1/2 Average Risk   3.4   3.3  Average Risk       5.0   4.4  2 X Average Risk   9.6   7.1  3 X Average Risk  23.4   11.0        Use the calculated Patient Ratio above and the CHD Risk Table to determine the patient's CHD Risk.        ATP III  CLASSIFICATION (LDL):  <100     mg/dL   Optimal  100-129  mg/dL   Near or Above                    Optimal  130-159  mg/dL   Borderline  160-189  mg/dL   High  >190     mg/dL   Very High   TRIG 92 10/24/2010   CHOLHDL 3.8 10/24/2010       Results for CHACE, KLIPPEL (MRN [id number]) as of 03/21/2020 15:38  Ref. Range 03/21/2020 09:32  Sodium Latest Ref Range: 135 - 145 mEq/L 133 (L)  Potassium Latest Ref Range: 3.5 - 5.1 mEq/L 4.3  Chloride Latest Ref Range: 96 - 112 mEq/L 100  CO2 Latest Ref Range: 19 - 32 mEq/L 26  Glucose Latest Ref Range: 70 - 99 mg/dL 846 (H)  BUN Latest Ref Range: 6 - 23 mg/dL 23  Creatinine Latest Ref Range: 0.40 - 1.50 mg/dL 9.62  Calcium Latest Ref Range: 8.4 - 10.5 mg/dL 9.4  GFR Latest Ref Range: >60.00 mL/min 67.24  TSH Latest Ref Range: 0.35 - 4.50 uIU/mL 4.79 (H)  T4,Free(Direct) Latest Ref Range: 0.60 - 1.60 ng/dL 9.52      8/41/3244  W1U 8.8%   BUN/Cr 17/1.01  GFR 71 TSH 10.42 uIU/mL  HDL 32 Tg 138 LDL 71 ASSESSMENT / PLAN / RECOMMENDATIONS:   1) Type 2 Diabetes Mellitus, Poorly controlled, With Neuropathic and  Macrovascular  complications - Most recent A1c of 8.8 %. Goal A1c < 7.5 %.    Plan: GENERAL:  We discussed the importance of lifestyle changes with low-carb diet and avoiding sugar sweetened beverages/foods in optimizing glucose control.  He was provided with low carb options for snacks  We discussed add-on therapy with SGLT-2 inhibitors due to recent cardiovascular history.  We did discuss side effects such as dehydration, and genital infections.  I would also like to stop his glimepiride, due to the increased risk of hypoglycemia in the setting of sulfonylurea and insulin combinations in people > 27 years of age.  MEDICATIONS: - Stop Glimepiride  - Start Farxiga 5 mg daily with Breakfast  - Continue Toujeo 60 units daily  - Continue Metformin 1000 mg twice daily    EDUCATION / INSTRUCTIONS:  BG monitoring  instructions: Patient is instructed to check his blood sugars 1 times a day, fasting.  Call [facility] if: BG persistently < 70 or > 300. . I reviewed the Rule of 15 for the treatment of hypoglycemia in detail with the patient. Literature supplied.   2) Diabetic complications:   Eye: Does not have known diabetic retinopathy.   Neuro/ Feet: Does have known diabetic peripheral neuropathy.  Renal: Patient does not have known baseline CKD.    3) Lipids: Patient is on simvastatin 40 mg daily  4) Hypothyroidism:  -Patient is clinically euthyroid -He has been compliant with L T4 replacement -Recheck TFTs today showed improvement in TSH, its not quite 6-8 weeks yet from the start of LT-4 replacement, so I will not be making any changes  Medication Levothyroxine 25 mcg daily  Follow-up in 3 months    Signed electronically by: Lyndle Herrlich, MD  [facility] Endocrinology  [facility] [address] [place] [place], [place] [address] Phone: [phone] FAX: [phone]   CC: [name], MD [address] [place] [address] Phone: [phone]  Fax: [phone]    Return to Endocrinology clinic as below: Future Appointments  Date Time Provider Department Center  05/03/2020  9:45 AM Azalee Course, PA [facility]

## 2020-03-21 NOTE — Patient Instructions (Signed)
-   Stop Glimepiride  - Start Farxiga 5 mg daily with Breakfast  - Continue Toujeo 60 units daily  - Continue Metformin 1000 mg twice daily   Choose healthy, lower carb lower calorie snacks: toss salad, vegetables, cottage cheese, peanut butter, low fat cheese / string cheese, lower sodium deli meat, tuna salad or chicken salad     HOW TO TREAT LOW BLOOD SUGARS (Blood sugar LESS THAN 70 MG/DL)  Please follow the RULE OF 15 for the treatment of hypoglycemia treatment (when your (blood sugars are less than 70 mg/dL)    STEP 1: Take 15 grams of carbohydrates when your blood sugar is low, which includes:   3-4 GLUCOSE TABS  OR  3-4 OZ OF JUICE OR REGULAR SODA OR  ONE TUBE OF GLUCOSE GEL     STEP 2: RECHECK blood sugar in 15 MINUTES STEP 3: If your blood sugar is still low at the 15 minute recheck --> then, go back to STEP 1 and treat AGAIN with another 15 grams of carbohydrates.

## 2020-05-03 ENCOUNTER — Ambulatory Visit (INDEPENDENT_AMBULATORY_CARE_PROVIDER_SITE_OTHER): Payer: Medicare Other | Admitting: Physician Assistant

## 2020-05-03 ENCOUNTER — Encounter: Payer: Self-pay | Admitting: Physician Assistant

## 2020-05-03 ENCOUNTER — Other Ambulatory Visit: Payer: Self-pay

## 2020-05-03 VITALS — BP 122/78 | HR 70 | Temp 96.4°F | Ht 67.0 in | Wt 201.0 lb

## 2020-05-03 DIAGNOSIS — R079 Chest pain, unspecified: Secondary | ICD-10-CM | POA: Diagnosis not present

## 2020-05-03 DIAGNOSIS — I25709 Atherosclerosis of coronary artery bypass graft(s), unspecified, with unspecified angina pectoris: Secondary | ICD-10-CM | POA: Diagnosis not present

## 2020-05-03 DIAGNOSIS — E785 Hyperlipidemia, unspecified: Secondary | ICD-10-CM | POA: Diagnosis not present

## 2020-05-03 DIAGNOSIS — E119 Type 2 diabetes mellitus without complications: Secondary | ICD-10-CM

## 2020-05-03 MED ORDER — NITROGLYCERIN 0.4 MG SL SUBL: 0.4000 mg | SUBLINGUAL_TABLET | SUBLINGUAL | 3 refills | Status: DC | PRN

## 2020-05-03 NOTE — Patient Instructions (Signed)
Medication Instructions:   Take Nitroglycerin as needed for chest pain. DO NOT EXCEED more than 3 tablets in an episode   Take 500 mg of Tylenol 2 times a day for 3 days. You may take Tylenol as needed DO NOT use more than 4000 mg in a 24 hours   *If you need a refill on your cardiac medications before your next appointment, please call your pharmacy*  Lab Work: NONE ordered at this time of appointment   If you have labs (blood work) drawn today and your tests are completely normal, you will receive your results only by: Marland Kitchen MyChart Message (if you have MyChart) OR . A paper copy in the mail If you have any lab test that is abnormal or we need to change your treatment, we will call you to review the results.  Testing/Procedures: NONE ordered at this time of appointment   Follow-Up: At [facility], you and your health needs are our priority.  As part of our continuing mission to provide you with exceptional heart care, we have created designated Provider Care Teams.  These Care Teams include your primary Cardiologist (physician) and Advanced Practice Providers (APPs -  Physician Assistants and Nurse Practitioners) who all work together to provide you with the care you need, when you need it.  Your next appointment:   3 month(s)  The format for your next appointment:   In Person  Provider:   Nanetta Batty, MD  Other Instructions

## 2020-05-03 NOTE — Progress Notes (Signed)
Cardiology Office Note:    Date:  05/05/2020   ID:  Fred [name], DOB 17-Jun-1941, MRN [id number]  PCP:  [name], MD  Cardiologist:  Nanetta Batty, MD  Electrophysiologist:  None   Referring MD: [name], MD   Chief Complaint  Patient presents with  . Follow-up    seen for Dr. Allyson [name]    History of Present Illness:    Fred [name] is a 79 y.o. 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  Patient was seen by Dr. Allyson [name] in 2020, event monitor ordered for dizziness showed sinus rhythm with occasional PACs, PVCs and short run of PSVT.  Coronary CTA obtained on 09/21/2019 did show three-vessel disease.  Echocardiogram obtained on 09/28/2019 showed normal EF with grade 1 DD.  Patient eventually underwent cardiac catheterization on 10/11/2019 revealing severe left main and three-vessel disease.  He subsequently underwent CABG x4 with LIMA to LAD, SVG to ramus, SVG to OM and SVG to PDA surgery by Dr. [name].  Patient presents today for 22-month cardiology follow-up.  Recently, he did have a recurrent chest discomfort.  He says this is substernal and worsens when he take a deep breath, sneeze or cough.  The symptom sounds more musculoskeletal in nature.  He has been able to walk around recently without any exertional chest discomfort.  EKG continue to show T wave inversion in V1 V2 without other ischemic changes since the previous EKG in November.  Given unchanged EKG and atypical symptoms, I did not recommend any further work-up.  I asked him to take some Tylenol for his chest pain.  If the chest pain worsens in frequency or duration or began to occur more so with physical activity, he is to contact cardiology service.  Otherwise I recommended earlier follow-up with Dr. Allyson [name] in 3 months.  Normally, we would begin to titrate high-dose aspirin down to 81 mg 3 months after the bypass surgery, however given the atypical chest discomfort he has recently, I did not  try to decrease his aspirin.  Otherwise he has no lower extremity edema, orthopnea or PND.  Past Medical History:  Diagnosis Date  . Coronary artery disease   . Diabetes mellitus without complication (HCC)   . High cholesterol   . Vertigo     Past Surgical History:  Procedure Laterality Date  . CARDIAC CATHETERIZATION    . CORONARY ARTERY BYPASS GRAFT N/A 10/12/2019   Procedure: CORONARY ARTERY BYPASS GRAFTING (CABG) x 4 (LIMA to LAD, SVG to OM, SVG to RAMUS INTERMEDIATE, SVG to PDA) with EVH from RIGHT GREATER SAPHENOUS VEIN and LEFT INTERNAL MAMMARY ARTERY HARVEST;  Surgeon: Kerin Perna, MD;  Location: [facility] OR;  Service: Open Heart Surgery;  Laterality: N/A;  . FOOT SURGERY    . KNEE SURGERY    . LEFT HEART CATH AND CORONARY ANGIOGRAPHY N/A 10/11/2019   Procedure: LEFT HEART CATH AND CORONARY ANGIOGRAPHY;  Surgeon: Runell Gess, MD;  Location: [facility] INVASIVE [facility] LAB;  Service: Cardiovascular;  Laterality: N/A;  . NASAL SEPTUM SURGERY    . TEE WITHOUT CARDIOVERSION N/A 10/12/2019   Procedure: TRANSESOPHAGEAL ECHOCARDIOGRAM (TEE);  Surgeon: [name], Theron Arista, MD;  Location: [facility] OR;  Service: Open Heart Surgery;  Laterality: N/A;    Current Medications: Current Meds  Medication Sig  . aspirin EC 325 MG EC tablet Take 1 tablet (325 mg total) by mouth daily.  . B-D UF III MINI PEN NEEDLES 31G X 5  MM MISC USE TO CHECK BLOOD SUGARS ONCE A DAY  . Blood Glucose Monitoring Suppl (ACCU-CHEK AVIVA) device by Other route. Use as instructed 2 times daily  . clotrimazole (LOTRIMIN) 1 % cream Apply 1 application topically as needed (Groin).  . dapagliflozin propanediol (FARXIGA) 5 MG TABS tablet Take 5 mg by mouth daily before breakfast.  . dapagliflozin propanediol (FARXIGA) 5 MG TABS tablet Farxiga 5 mg tablet  TAKE 1 TABLET BY MOUTH DAILY BEFORE BREAKFAST  . levothyroxine (SYNTHROID) 25 MCG tablet levothyroxine 25 mcg tablet  TAKE 1 TABLET BY MOUTH EVERY DAY IN THE MORNING ON AN EMPTY STOMACH    . metFORMIN (GLUCOPHAGE) 1000 MG tablet Take 1,000 mg by mouth 2 (two) times daily with a meal.   . metoprolol tartrate (LOPRESSOR) 50 MG tablet metoprolol tartrate 50 mg tablet  TK 1 T PO ONCE FOR 1 DOSE TK 2 HOURS PRIOR TO YOUR CORONARY CTA  . Multiple Vitamins-Minerals (MULTIVITAMIN WITH MINERALS) tablet Take 1 tablet by mouth daily.  . simvastatin (ZOCOR) 40 MG tablet Take 40 mg by mouth daily at 6 PM.   . solifenacin (VESICARE) 5 MG tablet Take 5 mg by mouth daily.   . tamsulosin (FLOMAX) 0.4 MG CAPS capsule tamsulosin 0.4 mg capsule  TK 1 C PO QD  . TOUJEO MAX SOLOSTAR 300 UNIT/ML Solostar Pen      Allergies:   Patient has no known allergies.   Social History   Socioeconomic History  . Marital status: Married    Spouse name: Not on file  . Number of children: Not on file  . Years of education: 9212  . Highest education level: Some college, no degree  Occupational History  . Occupation: Retired  Tobacco Use  . Smoking status: Former Smoker    Years: 10.00    Types: Cigarettes    Quit date: 12/1962    Years since quitting: 57.4  . Smokeless tobacco: Never Used  Substance and Sexual Activity  . Alcohol use: No  . Drug use: No  . Sexual activity: Not on file  Other Topics Concern  . Not on file  Social History Narrative  . Not on file   Social Determinants of Health   Financial Resource Strain:   . Difficulty of Paying Living Expenses:   Food Insecurity:   . Worried About Programme researcher, broadcasting/film/videounning Out of Food in the Last Year:   . Baristaan Out of Food in the Last Year:   Transportation Needs:   . Freight forwarderLack of Transportation (Medical):   Marland Kitchen. Lack of Transportation (Non-Medical):   Physical Activity:   . Days of Exercise per Week:   . Minutes of Exercise per Session:   Stress:   . Feeling of Stress :   Social Connections:   . Frequency of Communication with Friends and Family:   . Frequency of Social Gatherings with Friends and Family:   . Attends Religious Services:   . Active Member of  Clubs or Organizations:   . Attends BankerClub or Organization Meetings:   Marland Kitchen. Marital Status:      Family History: The patient's family history is not on file.  ROS:   Please see the history of present illness.     All other systems reviewed and are negative.  EKGs/Labs/Other Studies Reviewed:    The following studies were reviewed today:  Echo 10/06/2019 1. Left ventricular ejection fraction, by visual estimation, is 55 to  60%. The left ventricle has normal function. There is mildly increased  left  ventricular hypertrophy.  2. Left ventricular diastolic parameters are consistent with Grade I  diastolic dysfunction (impaired relaxation).  3. The left ventricle has no regional wall motion abnormalities.  4. The aortic valve is tricuspid. Aortic valve regurgitation is mild. No  evidence of aortic valve sclerosis or stenosis.  5. Global right ventricle has normal systolic function.The right  ventricular size is normal. No increase in right ventricular wall  thickness.  6. Left atrial size was normal.  7. Right atrial size was normal.  8. Presence of pericardial fat pad.  9. The mitral valve is grossly normal. No evidence of mitral valve  regurgitation.  10. The tricuspid valve is grossly normal. Tricuspid valve regurgitation  is trivial.  11. There is Mild calcification of the aortic valve.  12. There is Mild thickening of the aortic valve.  13. The pulmonic valve was grossly normal. Pulmonic valve regurgitation is  not visualized.  14. Normal pulmonary artery systolic pressure.  15. The tricuspid regurgitant velocity is 2.23 m/s, and with an assumed  right atrial pressure of 3 mmHg, the estimated right ventricular systolic  pressure is normal at 22.9 mmHg.  16. The inferior vena cava is normal in size with greater than 50%  respiratory variability, suggesting right atrial pressure of 3 mmHg.  17. The interatrial septum 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    CABG x 4  10/12/2019 OPERATION: 1.  Coronary artery bypass grafting x4 (left internal mammary artery to left anterior descending, saphenous vein graft to posterior descending, saphenous vein graft to obtuse marginal 1, saphenous vein graft to ramus intermediate). 2.  Endoscopic harvest of right leg greater saphenous vein.  EKG:  EKG is ordered today.  The ekg ordered today demonstrates normal sinus rhythm with T wave inversion in V1 and V2, unchanged when compared to the previous EKG.  Recent Labs: 10/12/2019: ALT 17 10/13/2019: Magnesium 2.4 10/19/2019: Hemoglobin 10.8; Platelets 375 03/21/2020: BUN 23; Creatinine, Ser 1.06; Potassium 4.3; Sodium 133; TSH 4.79  Recent Lipid Panel    Component Value Date/Time   CHOL  10/24/2010 0846    117        ATP III CLASSIFICATION:  <200     mg/dL   Desirable  496-759  mg/dL   Borderline High  >=163    mg/dL   High          TRIG 92 10/24/2010 0846   HDL 31 (L) 10/24/2010 0846   CHOLHDL 3.8 10/24/2010 0846   VLDL 18 10/24/2010 0846   LDLCALC  10/24/2010 0846    68        Total Cholesterol/HDL:CHD Risk Coronary Heart Disease Risk Table                     Men   Women  1/2 Average Risk   3.4   3.3  Average Risk       5.0   4.4  2 X Average Risk   9.6   7.1  3 X Average Risk  23.4   11.0        Use the calculated Patient Ratio above and the CHD Risk Table to determine the patient's CHD Risk.        ATP III CLASSIFICATION (LDL):  <100     mg/dL   Optimal  846-659  mg/dL   Near or Above                    Optimal  130-159  mg/dL  Borderline  160-189  mg/dL   High  >190     mg/dL   Very High    Physical Exam:    VS:  BP 122/78   Pulse 70   Temp (!) 96.4 F (35.8 C)   Ht 5\' 7"  (1.702 m)   Wt 201 lb (91.2 kg)   SpO2 98%   BMI 31.48 kg/m     Wt Readings from Last 3 Encounters:  05/03/20 201 lb (91.2 kg)  03/21/20 204 lb 6.4 oz (92.7 kg)  03/13/20 204 lb 9.4 oz (92.8 kg)     GEN:  Well nourished, well developed in no acute  distress HEENT: Normal NECK: No JVD; No carotid bruits LYMPHATICS: No lymphadenopathy CARDIAC: RRR, no murmurs, rubs, gallops RESPIRATORY:  Clear to auscultation without rales, wheezing or rhonchi  ABDOMEN: Soft, non-tender, non-distended MUSCULOSKELETAL:  No edema; No deformity  SKIN: Warm and dry NEUROLOGIC:  Alert and oriented x 3 PSYCHIATRIC:  Normal affect   ASSESSMENT:    1. Chest pain, unspecified type   2. Coronary artery disease involving coronary bypass graft of native heart with angina pectoris ([place])   3. Hyperlipidemia LDL goal <70   4. Controlled type 2 diabetes mellitus without complication, without long-term current use of insulin (HCC)    PLAN:    In order of problems listed above:  1. Atypical chest pain: Symptom sounds more musculoskeletal in nature.  It does not occurs with physical activity.  I did not recommend any further work-up  2. CAD s/p CABG: Continue on aspirin.  I did not lower the dose of aspirin to 81 mg daily because of the chest discomfort.  Continue metoprolol  3. Hyperlipidemia: On simvastatin  4. DM2: Managed by primary care provider.   Medication Adjustments/Labs and Tests Ordered: Current medicines are reviewed at length with the patient today.  Concerns regarding medicines are outlined above.  Orders Placed This Encounter  Procedures  . EKG 12-Lead   Meds ordered this encounter  Medications  . DISCONTD: nitroGLYCERIN (NITROSTAT) 0.4 MG SL tablet    Sig: Place 1 tablet (0.4 mg total) under the tongue every 5 (five) minutes as needed for chest pain.    Dispense:  90 tablet    Refill:  3  . nitroGLYCERIN (NITROSTAT) 0.4 MG SL tablet    Sig: Place 1 tablet (0.4 mg total) under the tongue every 5 (five) minutes as needed for chest pain.    Dispense:  25 tablet    Refill:  3    Patient Instructions  Medication Instructions:   Take Nitroglycerin as needed for chest pain. DO NOT EXCEED more than 3 tablets in an episode   Take 500  mg of Tylenol 2 times a day for 3 days. You may take Tylenol as needed DO NOT use more than 4000 mg in a 24 hours   *If you need a refill on your cardiac medications before your next appointment, please call your pharmacy*  Lab Work: NONE ordered at this time of appointment   If you have labs (blood work) drawn today and your tests are completely normal, you will receive your results only by: Marland Kitchen MyChart Message (if you have MyChart) OR . A paper copy in the mail If you have any lab test that is abnormal or we need to change your treatment, we will call you to review the results.  Testing/Procedures: NONE ordered at this time of appointment   Follow-Up: At [facility], you and your health  needs are our priority.  As part of our continuing mission to provide you with exceptional heart care, we have created designated Provider Care Teams.  These Care Teams include your primary Cardiologist (physician) and Advanced Practice Providers (APPs -  Physician Assistants and Nurse Practitioners) who all work together to provide you with the care you need, when you need it.  Your next appointment:   3 month(s)  The format for your next appointment:   In Person  Provider:   Nanetta Batty, MD  Other Instructions      Signed, Azalee Course, PA  05/05/2020 11:06 PM    [facility]

## 2020-05-05 ENCOUNTER — Encounter: Payer: Self-pay | Admitting: Physician Assistant

## 2020-05-19 ENCOUNTER — Telehealth: Payer: Self-pay | Admitting: Cardiovascular Disease

## 2020-05-19 NOTE — Telephone Encounter (Signed)
New Message   Pt is calling and says he is having discomfort in the surgery area. He is wondering if Dr Allyson Sabal can see him sooner then 06/29    Please call

## 2020-05-19 NOTE — Telephone Encounter (Signed)
Pt called to report that he has CABG 10/2019 and he still has odd sensations at his incision site such as "movement".. he says it hurts in the local area to cough and just feels he would like Dr. Allyson Sabal to take a look at him.. he declines edema, color change, no heat... offered him an APP appt but he declined and says he would ike to see. Dr. Allyson Sabal but his schedule is full for some time.. will forward to Dr. Allyson Sabal for review and advice.    Pt asks to leave detailed message on his home number.

## 2020-05-20 NOTE — Telephone Encounter (Signed)
Happy to see Fred [name] back anytime.  JJB

## 2020-05-22 NOTE — Telephone Encounter (Signed)
Spoke with pt, follow up moved up to cancellation.

## 2020-05-26 ENCOUNTER — Other Ambulatory Visit: Payer: Self-pay

## 2020-05-26 ENCOUNTER — Telehealth: Payer: Self-pay

## 2020-05-26 ENCOUNTER — Ambulatory Visit (INDEPENDENT_AMBULATORY_CARE_PROVIDER_SITE_OTHER): Payer: Medicare Other | Admitting: Cardiovascular Disease

## 2020-05-26 ENCOUNTER — Encounter: Payer: Self-pay | Admitting: Cardiovascular Disease

## 2020-05-26 VITALS — BP 112/60 | HR 68 | Ht 67.0 in | Wt 198.4 lb

## 2020-05-26 DIAGNOSIS — R0789 Other chest pain: Secondary | ICD-10-CM

## 2020-05-26 DIAGNOSIS — E782 Mixed hyperlipidemia: Secondary | ICD-10-CM

## 2020-05-26 DIAGNOSIS — Z951 Presence of aortocoronary bypass graft: Secondary | ICD-10-CM | POA: Diagnosis not present

## 2020-05-26 NOTE — Patient Instructions (Signed)
Medication Instructions:  Your Physician recommend you continue on your current medication as directed.    *If you need a refill on your cardiac medications before your next appointment, please call your pharmacy*   Lab Work: None   Testing/Procedures: None   Follow-Up: At [facility], you and your health needs are our priority.  As part of our continuing mission to provide you with exceptional heart care, we have created designated Provider Care Teams.  These Care Teams include your primary Cardiologist (physician) and Advanced Practice Providers (APPs -  Physician Assistants and Nurse Practitioners) who all work together to provide you with the care you need, when you need it.  We recommend signing up for the patient portal called "MyChart".  Sign up information is provided on this After Visit Summary.  MyChart is used to connect with patients for Virtual Visits (Telemedicine).  Patients are able to view lab/test results, encounter notes, upcoming appointments, etc.  Non-urgent messages can be sent to your provider as well.   To learn more about what you can do with MyChart, go to [email or web address].    Your next appointment:   1 year(s)  The format for your next appointment:   In Person  Provider:   Jonathan Berry, MD  Your physician recommends that you schedule a follow-up appointment in 6 months with Hao Meng, PA   

## 2020-05-26 NOTE — Telephone Encounter (Signed)
Contacted Dr. [name] office to make aware that pt was seen in office today by Dr. Allyson Sabal with c/o substernal discomfort post-CABG. MD recommended pt schedule a f/u appointment with Dr. [name] for further evaluations.   Nurse was made aware by Dr. [name] office that someone would contacted pt to schedule appointment.

## 2020-05-26 NOTE — Assessment & Plan Note (Signed)
History of hyperlipidemia on statin therapy with lipid profile performed 01/24/2020 revealing total cholesterol 128, LDL 71 and HDL 32

## 2020-05-26 NOTE — Progress Notes (Signed)
05/26/2020 Fred [name]   July 04, 1941  [id number]  Primary Physician [name], MD Primary Cardiologist: Runell Gess MD Nicholes Calamity, [place]  HPI:  Fred [name] is a 79 y.o.  mildly overweight weight married Caucasian male father of 2, grandfather of 4 grandchildren who is referred by the ER and Dr. Tiburcio Pea for evaluation of dizziness. He retired in 2000 from working at [employer] in [place].I last saw him in the office  11/01/2019.His cardiac risk factors include treated diabetes and hyperlipidemia. He does not smoke. He does have a family history of heart disease with a mother that had CABG and a younger brother who died as well which was found in the evaluation of syncope. He is never had a heart attack or stroke. He denies chest pain or shortness of breath. He is fairly active and routinely walked 3 miles a day up until COVID-19 when he became more sedentary however recently he is try to increase his activity. On 08/30/2019 he went out for his morning run of 3 miles and at the end he felt weak. When driving back home he thought that he may have passed out but he was dizzy. He went to the ER where he was nauseated as well. ER evaluation was unrevealing. He said he felt a little bit of chest discomfort this morning when coming to the office but otherwise specifically denied the symptoms.  I performed event monitoring which showed predominantly sinus rhythm with occasional PACs, PVCs and short runs of PSVT.A coronary CTA did show three-vessel disease. Based on this, I recommended that we proceed with outpatient diagnostic coronary angiography which was done on 10/11/2019 revealing left main/three-vessel disease.  The following day he underwent CABG x4 by Dr. Maren Beach with a LIMA to his LAD, vein to ramus branch, obtuse marginal branch and to the PDA.  He was discharged home on 10/20/2019 and has been doing well since.  Since I saw him in  November 2020 he has done well until recently when he is noticed some sternal pain at his incision site, worse with coughing and certain positions.  Denies angina.   Current Meds  Medication Sig  . aspirin EC 325 MG EC tablet Take 1 tablet (325 mg total) by mouth daily.  . B-D UF III MINI PEN NEEDLES 31G X 5 MM MISC USE TO CHECK BLOOD SUGARS ONCE A DAY  . Blood Glucose Monitoring Suppl (ACCU-CHEK AVIVA) device by Other route. Use as instructed 2 times daily  . clotrimazole (LOTRIMIN) 1 % cream Apply 1 application topically as needed (Groin).  . dapagliflozin propanediol (FARXIGA) 5 MG TABS tablet Take 5 mg by mouth daily before breakfast.  . levothyroxine (SYNTHROID) 25 MCG tablet levothyroxine 25 mcg tablet  TAKE 1 TABLET BY MOUTH EVERY DAY IN THE MORNING ON AN EMPTY STOMACH  . metFORMIN (GLUCOPHAGE) 1000 MG tablet Take 1,000 mg by mouth 2 (two) times daily with a meal.   . metoprolol tartrate (LOPRESSOR) 50 MG tablet metoprolol tartrate 50 mg tablet  TK 1 T PO ONCE FOR 1 DOSE TK 2 HOURS PRIOR TO YOUR CORONARY CTA  . Multiple Vitamins-Minerals (MULTIVITAMIN WITH MINERALS) tablet Take 1 tablet by mouth daily.  . nitroGLYCERIN (NITROSTAT) 0.4 MG SL tablet Place 1 tablet (0.4 mg total) under the tongue every 5 (five) minutes as needed for chest pain.  . simvastatin (ZOCOR) 40 MG tablet Take 40 mg by mouth daily at 6 PM.   . solifenacin (  VESICARE) 5 MG tablet Take 5 mg by mouth daily.   . tamsulosin (FLOMAX) 0.4 MG CAPS capsule tamsulosin 0.4 mg capsule  TK 1 C PO QD  . TOUJEO MAX SOLOSTAR 300 UNIT/ML Solostar Pen   . [DISCONTINUED] dapagliflozin propanediol (FARXIGA) 5 MG TABS tablet Farxiga 5 mg tablet  TAKE 1 TABLET BY MOUTH DAILY BEFORE BREAKFAST     No Known Allergies  Social History   Socioeconomic History  . Marital status: Married    Spouse name: Not on file  . Number of children: Not on file  . Years of education: 11  . Highest education level: Some college, no degree    Occupational History  . Occupation: Retired  Tobacco Use  . Smoking status: Former Smoker    Years: 10.00    Types: Cigarettes    Quit date: 12/1962    Years since quitting: 57.5  . Smokeless tobacco: Never Used  Substance and Sexual Activity  . Alcohol use: No  . Drug use: No  . Sexual activity: Not on file  Other Topics Concern  . Not on file  Social History Narrative  . Not on file   Social Determinants of Health   Financial Resource Strain:   . Difficulty of Paying Living Expenses:   Food Insecurity:   . Worried About Programme researcher, broadcasting/film/video in the Last Year:   . Barista in the Last Year:   Transportation Needs:   . Freight forwarder (Medical):   Marland Kitchen Lack of Transportation (Non-Medical):   Physical Activity:   . Days of Exercise per Week:   . Minutes of Exercise per Session:   Stress:   . Feeling of Stress :   Social Connections:   . Frequency of Communication with Friends and Family:   . Frequency of Social Gatherings with Friends and Family:   . Attends Religious Services:   . Active Member of Clubs or Organizations:   . Attends Banker Meetings:   Marland Kitchen Marital Status:   Intimate Partner Violence:   . Fear of Current or Ex-Partner:   . Emotionally Abused:   Marland Kitchen Physically Abused:   . Sexually Abused:      Review of Systems: General: negative for chills, fever, night sweats or weight changes.  Cardiovascular: negative for chest pain, dyspnea on exertion, edema, orthopnea, palpitations, paroxysmal nocturnal dyspnea or shortness of breath Dermatological: negative for rash Respiratory: negative for cough or wheezing Urologic: negative for hematuria Abdominal: negative for nausea, vomiting, diarrhea, bright red blood per rectum, melena, or hematemesis Neurologic: negative for visual changes, syncope, or dizziness All other systems reviewed and are otherwise negative except as noted above.    Blood pressure 112/60, pulse 68, height 5\' 7"   (1.702 m), weight 198 lb 6.4 oz (90 kg), SpO2 96 %.  General appearance: alert and no distress Neck: no adenopathy, no carotid bruit, no JVD, supple, symmetrical, trachea midline and thyroid not enlarged, symmetric, no tenderness/mass/nodules Lungs: clear to auscultation bilaterally Heart: regular rate and rhythm, S1, S2 normal, no murmur, click, rub or gallop Extremities: extremities normal, atraumatic, no cyanosis or edema Pulses: 2+ and symmetric Skin: Skin color, texture, turgor normal. No rashes or lesions Neurologic: Alert and oriented X 3, normal strength and tone. Normal symmetric reflexes. Normal coordination and gait  EKG sinus rhythm at 68 without ST or T wave changes.  I personally reviewed this EKG.  ASSESSMENT AND PLAN:   Hyperlipidemia History of hyperlipidemia on statin therapy with  lipid profile performed 01/24/2020 revealing total cholesterol 128, LDL 71 and HDL 32  S/P CABG x 4 History of CAD status post cardiac catheterization performed by myself 10/11/2019 revealing left main/three-vessel disease.  The following day he underwent CABG x4 by Dr. Darcey Nora with a LIMA to the LAD, vein to the ramus branch, obtuse marginal branch and PDA.  He was discharged home on 10/20/2019.  He has done well since.  He does complain of some sternal discomfort with coughing and changes of perception which is fairly new.  I am going to get him back to see Dr. Darcey Nora for further evaluation.  He would be fairly unusual for him to have sternal dehiscence this late post bypass surgery.      Lorretta Harp MD FACP,FACC,FAHA, [facility] 05/26/2020 11:37 AM

## 2020-05-26 NOTE — Assessment & Plan Note (Signed)
History of CAD status post cardiac catheterization performed by myself 10/11/2019 revealing left main/three-vessel disease.  The following day he underwent CABG x4 by Dr. Maren Beach with a LIMA to the LAD, vein to the ramus branch, obtuse marginal branch and PDA.  He was discharged home on 10/20/2019.  He has done well since.  He does complain of some sternal discomfort with coughing and changes of perception which is fairly new.  I am going to get him back to see Dr. Maren Beach for further evaluation.  He would be fairly unusual for him to have sternal dehiscence this late post bypass surgery.

## 2020-06-06 ENCOUNTER — Other Ambulatory Visit: Payer: Self-pay | Admitting: Cardiothoracic Surgery

## 2020-06-06 ENCOUNTER — Ambulatory Visit: Payer: Medicare Other | Admitting: Cardiovascular Disease

## 2020-06-06 DIAGNOSIS — Z951 Presence of aortocoronary bypass graft: Secondary | ICD-10-CM

## 2020-06-07 ENCOUNTER — Other Ambulatory Visit: Payer: Self-pay | Admitting: *Deleted

## 2020-06-07 ENCOUNTER — Ambulatory Visit (INDEPENDENT_AMBULATORY_CARE_PROVIDER_SITE_OTHER): Payer: Medicare Other | Admitting: Cardiothoracic Surgery

## 2020-06-07 ENCOUNTER — Ambulatory Visit
Admission: RE | Admit: 2020-06-07 | Discharge: 2020-06-07 | Disposition: A | Discharge status: Home / Self Care | Payer: Medicare Other | Source: Ambulatory Visit | Attending: Cardiothoracic Surgery | Admitting: Cardiothoracic Surgery

## 2020-06-07 ENCOUNTER — Encounter: Payer: Self-pay | Admitting: Cardiothoracic Surgery

## 2020-06-07 ENCOUNTER — Other Ambulatory Visit: Payer: Self-pay

## 2020-06-07 VITALS — BP 135/77 | HR 87 | Resp 20 | Ht 67.0 in | Wt 195.0 lb

## 2020-06-07 DIAGNOSIS — Z951 Presence of aortocoronary bypass graft: Secondary | ICD-10-CM | POA: Diagnosis not present

## 2020-06-07 DIAGNOSIS — Z09 Encounter for follow-up examination after completed treatment for conditions other than malignant neoplasm: Secondary | ICD-10-CM | POA: Insufficient documentation

## 2020-06-07 DIAGNOSIS — R079 Chest pain, unspecified: Secondary | ICD-10-CM

## 2020-06-07 NOTE — Progress Notes (Signed)
PCP is [name], MD Referring Provider is [name], MD  Chief Complaint  Patient presents with  . Coronary Artery Disease    c/o popping sound in chest area x2 weeks/CXR , HX of CABG    HPI: The patient presents for evaluation of chest wall discomfort and some sternal popping sensation 6 months after urgent CABG x4.  He has pain when he sneezes or coughs.  He has sensation of fullness in the epigastrium washing dishes.  On exam there is no sternal instability but there is a 1 finger small subxiphoid hernia-fascial defect.  The patient denies exertional chest pain or shortness of breath.  He had a chest x-ray today which shows sternal wires intact and well aligned.   Past Medical History:  Diagnosis Date  . Coronary artery disease   . Diabetes mellitus without complication (HCC)   . High cholesterol   . Vertigo     Past Surgical History:  Procedure Laterality Date  . CARDIAC CATHETERIZATION    . CORONARY ARTERY BYPASS GRAFT N/A 10/12/2019   Procedure: CORONARY ARTERY BYPASS GRAFTING (CABG) x 4 (LIMA to LAD, SVG to OM, SVG to RAMUS INTERMEDIATE, SVG to PDA) with EVH from RIGHT GREATER SAPHENOUS VEIN and LEFT INTERNAL MAMMARY ARTERY HARVEST;  Surgeon: Kerin Perna, MD;  Location: [facility] OR;  Service: Open Heart Surgery;  Laterality: N/A;  . FOOT SURGERY    . KNEE SURGERY    . LEFT HEART CATH AND CORONARY ANGIOGRAPHY N/A 10/11/2019   Procedure: LEFT HEART CATH AND CORONARY ANGIOGRAPHY;  Surgeon: Runell Gess, MD;  [facility];  Service: Cardiovascular;  Laterality: N/A;  . NASAL SEPTUM SURGERY    . TEE WITHOUT CARDIOVERSION N/A 10/12/2019   Procedure: TRANSESOPHAGEAL ECHOCARDIOGRAM (TEE);  Surgeon: [name], Theron Arista, MD;  Location: [facility] OR;  Service: Open Heart Surgery;  Laterality: N/A;    History reviewed. No pertinent family history.  Social History Social History   Tobacco Use  . Smoking status: Former Smoker    Years: 10.00    Types:  Cigarettes    Quit date: 12/1962    Years since quitting: 57.5  . Smokeless tobacco: Never Used  Substance Use Topics  . Alcohol use: No  . Drug use: No    Current Outpatient Medications  Medication Sig Dispense Refill  . aspirin EC 325 MG EC tablet Take 1 tablet (325 mg total) by mouth daily. 30 tablet 0  . B-D UF III MINI PEN NEEDLES 31G X 5 MM MISC USE TO CHECK BLOOD SUGARS ONCE A DAY    . Blood Glucose Monitoring Suppl (ACCU-CHEK AVIVA) device by Other route. Use as instructed 2 times daily    . clotrimazole (LOTRIMIN) 1 % cream Apply 1 application topically as needed (Groin).    . dapagliflozin propanediol (FARXIGA) 5 MG TABS tablet Take 5 mg by mouth daily before breakfast. 30 tablet 4  . levothyroxine (SYNTHROID) 25 MCG tablet levothyroxine 25 mcg tablet  TAKE 1 TABLET BY MOUTH EVERY DAY IN THE MORNING ON AN EMPTY STOMACH    . metFORMIN (GLUCOPHAGE) 1000 MG tablet Take 1,000 mg by mouth 2 (two) times daily with a meal.     . metoprolol tartrate (LOPRESSOR) 25 MG tablet Take 12.5 mg by mouth 2 (two) times daily.    . Multiple Vitamins-Minerals (MULTIVITAMIN WITH MINERALS) tablet Take 1 tablet by mouth daily.    . nitroGLYCERIN (NITROSTAT) 0.4 MG SL tablet Place 1 tablet (0.4 mg total) under  the tongue every 5 (five) minutes as needed for chest pain. 25 tablet 3  . simvastatin (ZOCOR) 40 MG tablet Take 40 mg by mouth daily at 6 PM.     . solifenacin (VESICARE) 5 MG tablet Take 5 mg by mouth daily.     . tamsulosin (FLOMAX) 0.4 MG CAPS capsule tamsulosin 0.4 mg capsule  TK 1 C PO QD    . TOUJEO MAX SOLOSTAR 300 UNIT/ML Solostar Pen      No current facility-administered medications for this visit.    No Known Allergies  Review of Systems  No other complaints other than chest wall discomfort with movement of his upper extremities, a small subxiphoid hernia and some sensation of popping when he sneezes  BP 135/77   Pulse 87   Resp 20   Ht 5\' 7"  (1.702 m)   Wt 195 lb (88.5 kg)    SpO2 95% Comment: RA  BMI 30.54 kg/m  Physical Exam      Exam    General- alert and comfortable    Neck- no JVD, no cervical adenopathy palpable, no carotid bruit   Lungs- clear without rales, wheezes   Cor- regular rate and rhythm, no murmur , gallop   Abdomen- soft, non-tender.  Small 1 finger fascial defect beneath the xiphoid area.   Extremities - warm, non-tender, minimal edema   Neuro- oriented, appropriate, no focal weakness   Diagnostic Tests: Chest x-ray image personally reviewed as noted above  Impression: Chest wall discomfort. He may have developed some fibrous malunion or sternal separation.  Will need CT scan of chest to evaluate.  Plan: Return for office visit after CT scan of chest.   , MD [facility] [phone]

## 2020-06-21 ENCOUNTER — Other Ambulatory Visit: Payer: Self-pay

## 2020-06-21 ENCOUNTER — Encounter: Payer: Self-pay | Admitting: Cardiothoracic Surgery

## 2020-06-21 ENCOUNTER — Ambulatory Visit (INDEPENDENT_AMBULATORY_CARE_PROVIDER_SITE_OTHER): Payer: Medicare Other | Admitting: Cardiothoracic Surgery

## 2020-06-21 ENCOUNTER — Ambulatory Visit
Admission: RE | Admit: 2020-06-21 | Discharge: 2020-06-21 | Disposition: A | Discharge status: Home / Self Care | Payer: Medicare Other | Source: Ambulatory Visit | Attending: Cardiothoracic Surgery | Admitting: Cardiothoracic Surgery

## 2020-06-21 DIAGNOSIS — Z951 Presence of aortocoronary bypass graft: Secondary | ICD-10-CM

## 2020-06-21 DIAGNOSIS — R0789 Other chest pain: Secondary | ICD-10-CM | POA: Insufficient documentation

## 2020-06-21 DIAGNOSIS — R079 Chest pain, unspecified: Secondary | ICD-10-CM

## 2020-06-21 DIAGNOSIS — G8929 Other chronic pain: Secondary | ICD-10-CM

## 2020-06-21 DIAGNOSIS — M792 Neuralgia and neuritis, unspecified: Secondary | ICD-10-CM | POA: Diagnosis not present

## 2020-06-21 MED ORDER — IOPAMIDOL (ISOVUE-300) INJECTION 61%
75.0000 mL | Freq: Once | INTRAVENOUS | Status: AC | PRN
  Administered 2020-06-21: 75 mL via INTRAVENOUS

## 2020-06-21 MED ORDER — GABAPENTIN 100 MG PO CAPS: 100.0000 mg | ORAL_CAPSULE | Freq: Two times a day (BID) | ORAL | Status: DC

## 2020-06-21 NOTE — Progress Notes (Signed)
PCP is [name], MD Referring Provider is [name], MD  Chief Complaint  Patient presents with  . Routine Post Op    f/u with CT today, s/p CABG 10/12/19 w/ recent sternal popping/discomfort    HPI: Patient returns for follow-up of sternal discomfort associated with coughing and twisting his upper torso.  This started approximately 6 months postop.  On exam the sternum is stable without click or pop.  He has a small epigastric one finger hernia.  His symptoms sound like neuropathic pain.  Today he underwent a CT scan of the chest with contrast to evaluate the sternal closure.  The sternum appears to be well-healed.  Sternal wires are intact.  There is no evidence of fibrous malunion.  The subxiphoid hernia is of no risk to him and too small to surgically repair.   Past Medical History:  Diagnosis Date  . Coronary artery disease   . Diabetes mellitus without complication (HCC)   . High cholesterol   . Vertigo     Past Surgical History:  Procedure Laterality Date  . CARDIAC CATHETERIZATION    . CORONARY ARTERY BYPASS GRAFT N/A 10/12/2019   Procedure: CORONARY ARTERY BYPASS GRAFTING (CABG) x 4 (LIMA to LAD, SVG to OM, SVG to RAMUS INTERMEDIATE, SVG to PDA) with EVH from RIGHT GREATER SAPHENOUS VEIN and LEFT INTERNAL MAMMARY ARTERY HARVEST;  Surgeon: Kerin Perna, MD;  Location: [facility] OR;  Service: Open Heart Surgery;  Laterality: N/A;  . FOOT SURGERY    . KNEE SURGERY    . LEFT HEART CATH AND CORONARY ANGIOGRAPHY N/A 10/11/2019   Procedure: LEFT HEART CATH AND CORONARY ANGIOGRAPHY;  Surgeon: Runell Gess, MD;  Location: [facility];  Service: Cardiovascular;  Laterality: N/A;  . NASAL SEPTUM SURGERY    . TEE WITHOUT CARDIOVERSION N/A 10/12/2019   Procedure: TRANSESOPHAGEAL ECHOCARDIOGRAM (TEE);  Surgeon: [name], Theron Arista, MD;  Location: [facility] OR;  Service: Open Heart Surgery;  Laterality: N/A;    No family history on file.  Social History Social History    Tobacco Use  . Smoking status: Former Smoker    Years: 10.00    Types: Cigarettes    Quit date: 12/1962    Years since quitting: 57.5  . Smokeless tobacco: Never Used  Substance Use Topics  . Alcohol use: No  . Drug use: No    Current Outpatient Medications  Medication Sig Dispense Refill  . aspirin EC 325 MG EC tablet Take 1 tablet (325 mg total) by mouth daily. 30 tablet 0  . B-D UF III MINI PEN NEEDLES 31G X 5 MM MISC USE TO CHECK BLOOD SUGARS ONCE A DAY    . Blood Glucose Monitoring Suppl (ACCU-CHEK AVIVA) device by Other route. Use as instructed 1 time daily    . clotrimazole (LOTRIMIN) 1 % cream Apply 1 application topically as needed (Groin).    . dapagliflozin propanediol (FARXIGA) 5 MG TABS tablet Take 5 mg by mouth daily before breakfast. 30 tablet 4  . levothyroxine (SYNTHROID) 25 MCG tablet levothyroxine 25 mcg tablet  TAKE 1 TABLET BY MOUTH EVERY DAY IN THE MORNING ON AN EMPTY STOMACH    . metFORMIN (GLUCOPHAGE) 1000 MG tablet Take 1,000 mg by mouth 2 (two) times daily with a meal.     . metoprolol tartrate (LOPRESSOR) 25 MG tablet Take 12.5 mg by mouth 2 (two) times daily.    . Multiple Vitamins-Minerals (MULTIVITAMIN WITH MINERALS) tablet Take 1 tablet by mouth daily.    Marland Kitchen  nitroGLYCERIN (NITROSTAT) 0.4 MG SL tablet Place 1 tablet (0.4 mg total) under the tongue every 5 (five) minutes as needed for chest pain. 25 tablet 3  . simvastatin (ZOCOR) 40 MG tablet Take 40 mg by mouth daily at 6 PM.     . solifenacin (VESICARE) 5 MG tablet Take 5 mg by mouth daily.     . tamsulosin (FLOMAX) 0.4 MG CAPS capsule tamsulosin 0.4 mg capsule  TK 1 C PO QD    . TOUJEO MAX SOLOSTAR 300 UNIT/ML Solostar Pen      No current facility-administered medications for this visit.    No Known Allergies  Review of Systems  No recurrent angina no edema no dyspnea on exertion no PND Weight stable No fever No change in appetite  BP 132/74 (BP Location: Right Arm, Patient Position:  Sitting, Cuff Size: Large)   Pulse 69   Temp (!) 97.3 F (36.3 C) (Temporal)   Resp 20   Ht 5\' 7"  (1.702 m)   Wt 199 lb 6.4 oz (90.4 kg)   SpO2 95%   BMI 31.23 kg/m  Physical Exam      Exam    General- alert and comfortable    Neck- no JVD, no cervical adenopathy palpable, no carotid bruit   Lungs- clear without rales, wheezes   Cor- regular rate and rhythm, no murmur , gallop   Abdomen- soft, non-tender   Extremities - warm, non-tender, minimal edema   Neuro- oriented, appropriate, no focal weakness   Diagnostic Tests: CT scan images personally reviewed and discussed with patient.  Findings as noted above.  Sternal closure is intact.  No evidence of infection or fluid collection.  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  I will see him back in 2 months to reassess his symptoms.   , MD [facility] [phone]

## 2020-06-25 ENCOUNTER — Encounter: Payer: Self-pay | Admitting: Internal Medicine

## 2020-06-26 ENCOUNTER — Other Ambulatory Visit: Payer: Self-pay

## 2020-06-26 ENCOUNTER — Encounter: Payer: Self-pay | Admitting: Internal Medicine

## 2020-06-26 ENCOUNTER — Ambulatory Visit (INDEPENDENT_AMBULATORY_CARE_PROVIDER_SITE_OTHER): Payer: Medicare Other | Admitting: Internal Medicine

## 2020-06-26 VITALS — BP 110/64 | HR 64 | Ht 67.0 in | Wt 202.4 lb

## 2020-06-26 DIAGNOSIS — E1165 Type 2 diabetes mellitus with hyperglycemia: Secondary | ICD-10-CM | POA: Diagnosis not present

## 2020-06-26 DIAGNOSIS — E039 Hypothyroidism, unspecified: Secondary | ICD-10-CM

## 2020-06-26 DIAGNOSIS — Z794 Long term (current) use of insulin: Secondary | ICD-10-CM

## 2020-06-26 LAB — POCT GLYCOSYLATED HEMOGLOBIN (HGB A1C): Hemoglobin A1C: 8.8 % — AB (ref 4.0–5.6)

## 2020-06-26 LAB — BASIC METABOLIC PANEL
BUN: 21 mg/dL (ref 6–23)
CO2: 27 mEq/L (ref 19–32)
Calcium: 9.7 mg/dL (ref 8.4–10.5)
Chloride: 102 mEq/L (ref 96–112)
Creatinine, Ser: 1.12 mg/dL (ref 0.40–1.50)
GFR: 63.06 mL/min (ref >60.00)
Glucose, Bld: 132 mg/dL — ABNORMAL HIGH (ref 70–99)
Potassium: 4.8 mEq/L (ref 3.5–5.1)
Sodium: 138 mEq/L (ref 135–145)

## 2020-06-26 LAB — GLUCOSE, POCT (MANUAL RESULT ENTRY): POC Glucose: 189 mg/dl — AB (ref 70–99)

## 2020-06-26 LAB — T4, FREE: Free T4: 0.84 ng/dL (ref 0.60–1.60)

## 2020-06-26 LAB — TSH: TSH: 4.32 u[IU]/mL (ref 0.35–4.50)

## 2020-06-26 MED ORDER — DAPAGLIFLOZIN PROPANEDIOL 10 MG PO TABS
10.0000 mg | ORAL_TABLET | Freq: Every day | ORAL | 3 refills | Status: DC
Start: 2020-06-26 — End: 2021-04-16

## 2020-06-26 NOTE — Patient Instructions (Signed)
°-   Continue Farxiga 5 mg daily with Breakfast  - Continue Toujeo 60 units daily  - Continue Metformin 1000 mg twice daily     HOW TO TREAT LOW BLOOD SUGARS (Blood sugar LESS THAN 70 MG/DL)  Please follow the RULE OF 15 for the treatment of hypoglycemia treatment (when your (blood sugars are less than 70 mg/dL)    STEP 1: Take 15 grams of carbohydrates when your blood sugar is low, which includes:   3-4 GLUCOSE TABS  OR  3-4 OZ OF JUICE OR REGULAR SODA OR  ONE TUBE OF GLUCOSE GEL     STEP 2: RECHECK blood sugar in 15 MINUTES STEP 3: If your blood sugar is still low at the 15 minute recheck --> then, go back to STEP 1 and treat AGAIN with another 15 grams of carbohydrates.

## 2020-06-26 NOTE — Progress Notes (Signed)
Name: Fred [name]  Age/ Sex: 79 y.o., male   MRN/ DOB: [id number], Aug 21, 1941     PCP: [name], MD   Reason for Endocrinology Evaluation: Type 2 Diabetes Mellitus  Initial Endocrine Consultative Visit: 03/21/2020    PATIENT IDENTIFIER: [name] is a 79 y.o. male with a past medical history of  HTN, T2DM,CAD and Dyslipidemia. The patient has followed with Endocrinology clinic since 03/21/2020 for consultative assistance with management of his diabetes.  DIABETIC HISTORY:  Fred [name] was diagnosed with DM many years ago, he was initially on oral glycemic agents, but insulin had to be added due to  persistent hyperglycemiaand. His hemoglobin A1c has ranged from 7.8% in 2011, peaking at 9.0% in 2020.  Thyroid History :   He was noted to have an elevated TSH at 10.42 uIU/mL . He was started on LT-4 replacement in 02/2020.  SUBJECTIVE:   During the last visit (03/21/2020): A1c 8.8% . [name] was started,continued toujeo and metformin and stopped Glimepiride.    Today (06/26/2020): Fred [name] is here for a follow up on diabetes and hypothyroidism.   He checks his blood sugars 1 times daily, preprandial to breakfast. The patient has not had hypoglycemic episodes since the last clinic visit.  Denies recent UTI 's or genital infection  Denies nausea or diarrhea.  Endorses constipation  He endorses abnormal sensation at the feet      HOME DIABETES REGIMEN:  [name] 5 mg daily with Breakfast  Toujeo 60 units daily  Metformin 1000 mg twice daily      Statin: Yes ACE-I/ARB: no    METER DOWNLOAD SUMMARY: Did not bring     DIABETIC COMPLICATIONS: Microvascular complications:    Denies: CKD, retinopathy , neuropathy   Last eye exam: Completed 2021  Macrovascular complications:   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)  Denies: PVD, CVA  HISTORY:  Past Medical History:  Past Medical History:  Diagnosis Date  . Coronary artery disease   . Diabetes mellitus  without complication (HCC)   . High cholesterol   . Vertigo     Past Surgical History:  Past Surgical History:  Procedure Laterality Date  . CARDIAC CATHETERIZATION    . CORONARY ARTERY BYPASS GRAFT N/A 10/12/2019   Procedure: CORONARY ARTERY BYPASS GRAFTING (CABG) x 4 (LIMA to LAD, SVG to OM, SVG to RAMUS INTERMEDIATE, SVG to PDA) with EVH from RIGHT GREATER SAPHENOUS VEIN and LEFT INTERNAL MAMMARY ARTERY HARVEST;  Surgeon: Kerin Perna, MD;  Location: [facility] OR;  Service: Open Heart Surgery;  Laterality: N/A;  . FOOT SURGERY    . KNEE SURGERY    . LEFT HEART CATH AND CORONARY ANGIOGRAPHY N/A 10/11/2019   Procedure: LEFT HEART CATH AND CORONARY ANGIOGRAPHY;  Surgeon: Runell Gess, MD;  Location: [facility];  Service: Cardiovascular;  Laterality: N/A;  . NASAL SEPTUM SURGERY    . TEE WITHOUT CARDIOVERSION N/A 10/12/2019   Procedure: TRANSESOPHAGEAL ECHOCARDIOGRAM (TEE);  Surgeon: [name], Theron Arista, MD;  Location: [facility] OR;  Service: Open Heart Surgery;  Laterality: N/A;     Social History:  reports that he quit smoking about 57 years ago. His smoking use included cigarettes. He quit after 10.00 years of use. He has never used smokeless tobacco. He reports that he does not drink alcohol and does not use drugs.  Family History: No family history on file.   HOME MEDICATIONS: Allergies as of 06/26/2020   No Known Allergies  Medication List       Accurate as of June 26, 2020  8:30 AM. If you have any questions, ask your nurse or doctor.        Accu-Chek Aviva device by Other route. Use as instructed 1 time daily   aspirin 325 MG EC tablet Take 1 tablet (325 mg total) by mouth daily.   B-D UF III MINI PEN NEEDLES 31G X 5 MM Misc Generic drug: Insulin Pen Needle USE TO CHECK BLOOD SUGARS ONCE A DAY   clotrimazole 1 % cream Commonly known as: LOTRIMIN Apply 1 application topically as needed (Groin).   Farxiga 5 MG Tabs tablet Generic drug: dapagliflozin  propanediol Take 5 mg by mouth daily before breakfast.   gabapentin 100 MG capsule Commonly known as: Neurontin Take 1 capsule (100 mg total) by mouth 2 (two) times daily.   levothyroxine 25 MCG tablet Commonly known as: SYNTHROID levothyroxine 25 mcg tablet  TAKE 1 TABLET BY MOUTH EVERY DAY IN THE MORNING ON AN EMPTY STOMACH   metFORMIN 1000 MG tablet Commonly known as: GLUCOPHAGE Take 1,000 mg by mouth 2 (two) times daily with a meal.   metoprolol tartrate 25 MG tablet Commonly known as: LOPRESSOR Take 12.5 mg by mouth 2 (two) times daily.   multivitamin with minerals tablet Take 1 tablet by mouth daily.   nitroGLYCERIN 0.4 MG SL tablet Commonly known as: NITROSTAT Place 1 tablet (0.4 mg total) under the tongue every 5 (five) minutes as needed for chest pain.   simvastatin 40 MG tablet Commonly known as: ZOCOR Take 40 mg by mouth daily at 6 PM.   solifenacin 5 MG tablet Commonly known as: VESICARE Take 5 mg by mouth daily.   tamsulosin 0.4 MG Caps capsule Commonly known as: FLOMAX tamsulosin 0.4 mg capsule  TK 1 C PO QD   Toujeo Max SoloStar 300 UNIT/ML Solostar Pen Generic drug: insulin glargine (2 Unit Dial)        OBJECTIVE:   Vital Signs: BP 110/64 (BP Location: Left Arm, Patient Position: Sitting, Cuff Size: Large)   Pulse 64   Ht 5\' 7"  (1.702 m)   Wt 202 lb 6.4 oz (91.8 kg)   SpO2 98%   BMI 31.70 kg/m   Wt Readings from Last 3 Encounters:  06/26/20 202 lb 6.4 oz (91.8 kg)  06/21/20 199 lb 6.4 oz (90.4 kg)  06/07/20 195 lb (88.5 kg)     Exam: General: Pt appears well and is in NAD  Neck: General: Supple without adenopathy. Thyroid: Thyroid size normal.  No goiter or nodules appreciated. No thyroid bruit.  Lungs: Clear with good BS bilat with no rales, rhonchi, or wheezes  Heart: RRR with normal S1 and S2 and no gallops; no murmurs; no rub  Abdomen: Normoactive bowel sounds, soft, nontender, without masses or organomegaly palpable    Extremities: No pretibial edema. No tremor. Normal strength and motion throughout. See detailed diabetic foot exam below.  Skin: Normal texture and temperature to palpation. No rash noted. No Acanthosis nigricans/skin tags. No lipohypertrophy.  Neuro: MS is good with appropriate affect, pt is alert and Ox3    DM foot exam: 03/21/2020  The skin of the feet is without sores or ulcerations, plantar callous formation noted The pedal pulses are 2+ on right and 2+ on left. The sensation is decreased  to a screening 5.07, 10 gram monofilament bilaterally   DATA REVIEWED:  Lab Results  Component Value Date   HGBA1C 9.0 (H) 10/11/2019  HGBA1C (H) 10/24/2010    7.8 (NOTE)                                                                       According to the ADA Clinical Practice Recommendations for 2011, when HbA1c is used as a screening test:   >=6.5%   Diagnostic of Diabetes Mellitus           (if abnormal result  is confirmed)  5.7-6.4%   Increased risk of developing Diabetes Mellitus  References:Diagnosis and Classification of Diabetes Mellitus,Diabetes Care,2011,34(Suppl 1):S62-S69 and Standards of Medical Care in         Diabetes - 2011,Diabetes Care,2011,34  (Suppl 1):S11-S61.   Results for KADEN, DAUGHDRILL (MRN [id number]) as of 06/27/2020 10:38  Ref. Range 06/26/2020 08:59  Sodium Latest Ref Range: 135 - 145 mEq/L 138  Potassium Latest Ref Range: 3.5 - 5.1 mEq/L 4.8  Chloride Latest Ref Range: 96 - 112 mEq/L 102  CO2 Latest Ref Range: 19 - 32 mEq/L 27  Glucose Latest Ref Range: 70 - 99 mg/dL 242 (H)  BUN Latest Ref Range: 6 - 23 mg/dL 21  Creatinine Latest Ref Range: 0.40 - 1.50 mg/dL 3.53  Calcium Latest Ref Range: 8.4 - 10.5 mg/dL 9.7  GFR Latest Ref Range: >60.00 mL/min 63.06    ASSESSMENT / PLAN / RECOMMENDATIONS:   1) Type 2 Diabetes Mellitus, Poorly controlled, With Neuropathic and  Macrovascular  complications - Most recent A1c of 8.8 %. Goal A1c < 7.5 %.   -Slight  decreased in his A1c , pt with post-prandial hyperglycemia. I have encouraged him to start checking at bedtime as well.  - Will increase his [name] as below, BM normal  Plan: MEDICATIONS:  Increase farxiga 10 mg daily   Continue Toujeo 60 units daily   Continue Metformin 1000 mg BID   EDUCATION / INSTRUCTIONS:  BG monitoring instructions: Patient is instructed to check his blood sugars 2 times a day, fasting and bedtime .  Call [facility] [facility] if: BG persistently < 70 or > 300. . I reviewed the Rule of 15 for the treatment of hypoglycemia in detail with the patient. Literature supplied.   2) Diabetic complications:   Eye: Does not have known diabetic retinopathy.   Neuro/ Feet: Does  have known diabetic peripheral neuropathy .   Renal: Patient does not have known baseline CKD. He   is not on an ACEI/ARB at present.      3) Hypothyroidism:  -Patient is clinically euthyroid -He has been compliant with L T4 replacement - TFT's normal.     Medication Continue Levothyroxine 25 mcg daily     F/U in 4 months     Signed electronically by: Lyndle Herrlich, MD  [facility] Endocrinology  [facility] [address] [place] [place], [place] [address] Phone: [phone] FAX: [phone]   CC: [name], MD [address] [facility] [place] [address] Phone: [phone]  Fax: [phone]  Return to Endocrinology clinic as below: Future Appointments  Date Time Provider Department Center  08/23/2020 11:00 AM Kerin Perna, MD [facility]

## 2020-06-27 MED ORDER — LEVOTHYROXINE SODIUM 25 MCG PO TABS: 25.0000 ug | ORAL_TABLET | Freq: Every day | ORAL | 3 refills | Status: DC

## 2020-06-29 ENCOUNTER — Encounter: Payer: Self-pay | Admitting: Internal Medicine

## 2020-07-31 ENCOUNTER — Other Ambulatory Visit: Payer: Self-pay

## 2020-07-31 ENCOUNTER — Telehealth: Payer: Self-pay | Admitting: Internal Medicine

## 2020-07-31 MED ORDER — GLUCOSE BLOOD VI STRP: ORAL_STRIP | 2 refills | Status: DC

## 2020-07-31 MED ORDER — ACCU-CHEK FASTCLIX LANCETS MISC: 2 refills | Status: DC

## 2020-07-31 NOTE — Telephone Encounter (Signed)
Rx sent 

## 2020-07-31 NOTE — Telephone Encounter (Signed)
Medication Refill Request  Did you call your pharmacy and request this refill first? Yes  . If patient has not contacted pharmacy first, instruct them to do so for future refills.  . Remind them that contacting the pharmacy for their refill is the quickest method to get the refill.  . Refill policy also stated that it will take anywhere between 24-72 hours to receive the refill.    Name of medication? Test strips (accu check aviva plus) and lancets (accu check fast click)  Is this a 90 day supply? yes  Name and location of pharmacy?  [facility] - [place], [place] - [address] [place], [address] Phone:  [phone]  Fax:  [phone]       . Is the request for diabetes test strips? yes . If yes, what brand? accu chek aviva plus

## 2020-08-04 ENCOUNTER — Ambulatory Visit: Payer: Medicare Other | Admitting: Cardiovascular Disease

## 2020-08-23 ENCOUNTER — Ambulatory Visit (INDEPENDENT_AMBULATORY_CARE_PROVIDER_SITE_OTHER): Payer: Medicare Other | Admitting: Cardiothoracic Surgery

## 2020-08-23 ENCOUNTER — Other Ambulatory Visit: Payer: Self-pay

## 2020-08-23 ENCOUNTER — Encounter: Payer: Self-pay | Admitting: Cardiothoracic Surgery

## 2020-08-23 VITALS — BP 123/72 | HR 68 | Temp 97.6°F | Resp 20 | Ht 67.0 in | Wt 200.0 lb

## 2020-08-23 DIAGNOSIS — M792 Neuralgia and neuritis, unspecified: Secondary | ICD-10-CM | POA: Diagnosis not present

## 2020-08-23 DIAGNOSIS — Z951 Presence of aortocoronary bypass graft: Secondary | ICD-10-CM | POA: Diagnosis not present

## 2020-08-23 DIAGNOSIS — K439 Ventral hernia without obstruction or gangrene: Secondary | ICD-10-CM | POA: Insufficient documentation

## 2020-08-23 NOTE — Progress Notes (Signed)
PCP is Fred Blamer, MD Referring Provider is Fred Blamer, MD  Chief Complaint  Patient presents with  . Coronary Artery Disease    2 month f/u new medication eval, HX of CABG     HPI: Return visit for evaluation of sternal neuropathic pain and small epigastric hernia after urgent CABG a year ago.  He declined Neurontin. He actually is doing very well now and [place] using [place]aw [place] land and had no shortness of breath or chest pain.  If he feels a sneeze or cough he holds pressure against the epigastrium to reduce discomfort from the small 2 finger epigastric hernia. Past Medical History:  Diagnosis Date  . Coronary artery disease   . Diabetes mellitus without complication (HCC)   . High cholesterol   . Vertigo     Past Surgical History:  Procedure Laterality Date  . CARDIAC CATHETERIZATION    . CORONARY ARTERY BYPASS GRAFT N/A 10/12/2019   Procedure: CORONARY ARTERY BYPASS GRAFTING (CABG) x 4 (LIMA to LAD, SVG to OM, SVG to RAMUS INTERMEDIATE, SVG to PDA) with EVH from RIGHT GREATER SAPHENOUS VEIN and LEFT INTERNAL MAMMARY ARTERY HARVEST;  Surgeon: Kerin Perna, MD;  Location: [facility] OR;  Service: Open Heart Surgery;  Laterality: N/A;  . FOOT SURGERY    . KNEE SURGERY    . LEFT HEART CATH AND CORONARY ANGIOGRAPHY N/A 10/11/2019   Procedure: LEFT HEART CATH AND CORONARY ANGIOGRAPHY;  Surgeon: Runell Gess, MD;  [facility]: [facility];  Service: Cardiovascular;  Laterality: N/A;  . NASAL SEPTUM SURGERY    . TEE WITHOUT CARDIOVERSION N/A 10/12/2019   Procedure: TRANSESOPHAGEAL ECHOCARDIOGRAM (TEE);  Surgeon: [name], Theron Arista, MD;  Location: [facility] OR;  Service: Open Heart Surgery;  Laterality: N/A;    History reviewed. No pertinent family history.  Social History Social History   Tobacco Use  . Smoking status: Former Smoker    Years: 10.00    Types: Cigarettes    Quit date: 12/1962    Years since quitting: 57.7  . Smokeless tobacco:  Never Used  Substance Use Topics  . Alcohol use: No  . Drug use: No    Current Outpatient Medications  Medication Sig Dispense Refill  . Accu-Chek FastClix Lancets MISC Use Accu Chek Fastclix to check blood sugar once daily. 100 each 2  . aspirin EC 325 MG EC tablet Take 1 tablet (325 mg total) by mouth daily. 30 tablet 0  . B-D UF III MINI PEN NEEDLES 31G X 5 MM MISC USE TO CHECK BLOOD SUGARS ONCE A DAY    . Blood Glucose Monitoring Suppl (ACCU-CHEK AVIVA) device by Other route. Use as instructed 1 time daily    . clotrimazole (LOTRIMIN) 1 % cream Apply 1 application topically as needed (Groin).    . dapagliflozin propanediol (FARXIGA) 10 MG TABS tablet Take 1 tablet (10 mg total) by mouth daily before breakfast. 90 tablet 3  . glucose blood test strip Use Accu Chek Aviva test strips to check blood sugar once daily. 100 each 2  . levothyroxine (SYNTHROID) 25 MCG tablet Take 1 tablet (25 mcg total) by mouth daily before breakfast. 90 tablet 3  . metFORMIN (GLUCOPHAGE) 1000 MG tablet Take 1,000 mg by mouth 2 (two) times daily with a meal.     . metoprolol tartrate (LOPRESSOR) 25 MG tablet Take 12.5 mg by mouth 2 (two) times daily.    . Multiple Vitamins-Minerals (MULTIVITAMIN WITH MINERALS) tablet Take  1 tablet by mouth daily.    . nitroGLYCERIN (NITROSTAT) 0.4 MG SL tablet Place 1 tablet (0.4 mg total) under the tongue every 5 (five) minutes as needed for chest pain. 25 tablet 3  . simvastatin (ZOCOR) 40 MG tablet Take 40 mg by mouth daily at 6 PM.     . solifenacin (VESICARE) 5 MG tablet Take 5 mg by mouth daily.     . tamsulosin (FLOMAX) 0.4 MG CAPS capsule tamsulosin 0.4 mg capsule  TK 1 C PO QD    . TOUJEO MAX SOLOSTAR 300 UNIT/ML Solostar Pen      No current facility-administered medications for this visit.    No Known Allergies  Review of Systems  No complaints except for mild chest wall discomfort and the small epigastric hernia  BP 123/72   Pulse 68   Temp 97.6 F (36.4  C) (Skin)   Resp 20   Ht 5\' 7"  (1.702 m)   Wt 200 lb (90.7 kg)   SpO2 95% Comment: RA  BMI 31.32 kg/m  Physical Exam Alert and comfortable Lungs clear No JVD Heart rhythm regular without murmur Abdomen obese Small epigastric to finger hernia underneath the xiphoid easily reducible No peripheral edema  Diagnostic Tests:  None Impression: The patient has a minimally symptomatic epigastric hernia.  It is not significantly enlarged in a year.  Repairing it would probably be unsuccessful due to his abdominal obesity and the best approach is just conservative observation.  He will return in 6 months for a final evaluation or call if it becomes more of a problem in the interim.  Plan: Return in 6 months for wound check.   , MD [facility] [phone]

## 2020-09-12 ENCOUNTER — Telehealth: Payer: Self-pay | Admitting: Internal Medicine

## 2020-09-12 MED ORDER — ACCU-CHEK FASTCLIX LANCETS MISC: 2 refills | Status: DC

## 2020-09-12 MED ORDER — GLUCOSE BLOOD VI STRP: ORAL_STRIP | 2 refills | Status: DC

## 2020-09-12 NOTE — Telephone Encounter (Signed)
Patient called stating he is testing twice a day and his supplies are for once a day. He would like additional supplies for his accuchek lancets and test strips called in for enough for testing 2x/day  [facility], [place] - [address] [place], [address]  [address] [place], [address], [place] [address]  Phone:  [phone] Fax:  [phone]

## 2020-09-12 NOTE — Telephone Encounter (Signed)
Refills sent with updated direction.

## 2020-09-13 ENCOUNTER — Other Ambulatory Visit: Payer: Self-pay

## 2020-09-13 NOTE — Telephone Encounter (Signed)
New message     1. Which medications need to be refilled? (please list name of each medication and dose if known) Accu-Chek FastClix Lancets MISC - testing blood sugar twice a day due to quantity   2. Which pharmacy/location (including street and city if local pharmacy) is medication to be sent to?[facility] mail order   3. Do they need a 30 day or 90 day supply? 90 days supply

## 2020-09-14 ENCOUNTER — Ambulatory Visit: Payer: Medicare Other

## 2020-09-15 MED ORDER — ACCU-CHEK FASTCLIX LANCETS MISC: 6 refills | Status: DC

## 2020-09-15 NOTE — Telephone Encounter (Signed)
Lancet refills sent

## 2020-10-02 ENCOUNTER — Other Ambulatory Visit: Payer: Self-pay | Admitting: Internal Medicine

## 2020-10-02 ENCOUNTER — Other Ambulatory Visit: Payer: Self-pay | Admitting: Cardiovascular Disease

## 2020-10-30 ENCOUNTER — Other Ambulatory Visit: Payer: Self-pay

## 2020-10-30 ENCOUNTER — Encounter: Payer: Self-pay | Admitting: Internal Medicine

## 2020-10-30 ENCOUNTER — Ambulatory Visit (INDEPENDENT_AMBULATORY_CARE_PROVIDER_SITE_OTHER): Payer: Medicare Other | Admitting: Internal Medicine

## 2020-10-30 VITALS — BP 136/80 | HR 70 | Ht 67.0 in | Wt 197.5 lb

## 2020-10-30 DIAGNOSIS — Z794 Long term (current) use of insulin: Secondary | ICD-10-CM

## 2020-10-30 DIAGNOSIS — E1159 Type 2 diabetes mellitus with other circulatory complications: Secondary | ICD-10-CM

## 2020-10-30 DIAGNOSIS — E1142 Type 2 diabetes mellitus with diabetic polyneuropathy: Secondary | ICD-10-CM

## 2020-10-30 DIAGNOSIS — E1165 Type 2 diabetes mellitus with hyperglycemia: Secondary | ICD-10-CM | POA: Diagnosis not present

## 2020-10-30 DIAGNOSIS — E039 Hypothyroidism, unspecified: Secondary | ICD-10-CM

## 2020-10-30 LAB — BASIC METABOLIC PANEL
BUN: 22 mg/dL (ref 6–23)
CO2: 28 mEq/L (ref 19–32)
Calcium: 9.3 mg/dL (ref 8.4–10.5)
Chloride: 104 mEq/L (ref 96–112)
Creatinine, Ser: 1.11 mg/dL (ref 0.40–1.50)
GFR: 62.72 mL/min (ref >60.00)
Glucose, Bld: 192 mg/dL — ABNORMAL HIGH (ref 70–99)
Potassium: 4.6 mEq/L (ref 3.5–5.1)
Sodium: 139 mEq/L (ref 135–145)

## 2020-10-30 LAB — POCT GLYCOSYLATED HEMOGLOBIN (HGB A1C): Hemoglobin A1C: 7.5 % — AB (ref 4.0–5.6)

## 2020-10-30 MED ORDER — ACCU-CHEK GUIDE VI STRP: 1.0000 | ORAL_STRIP | Freq: Every day | 12 refills | Status: DC

## 2020-10-30 MED ORDER — INSULIN PEN NEEDLE 32G X 4 MM MISC: 1.0000 | Freq: Every day | 6 refills | Status: DC

## 2020-10-30 MED ORDER — TOUJEO MAX SOLOSTAR 300 UNIT/ML ~~LOC~~ SOPN
48.0000 [IU] | PEN_INJECTOR | Freq: Every day | SUBCUTANEOUS | 6 refills | Status: DC
Start: 2020-10-30 — End: 2021-10-04

## 2020-10-30 NOTE — Patient Instructions (Signed)
-   Decrease Toujeo to 48 units daily  - Continue Farxiga 10 mg daily with Breakfast  - Continue Metformin 1000 mg twice daily     HOW TO TREAT LOW BLOOD SUGARS (Blood sugar LESS THAN 70 MG/DL)  Please follow the RULE OF 15 for the treatment of hypoglycemia treatment (when your (blood sugars are less than 70 mg/dL)    STEP 1: Take 15 grams of carbohydrates when your blood sugar is low, which includes:   3-4 GLUCOSE TABS  OR  3-4 OZ OF JUICE OR REGULAR SODA OR  ONE TUBE OF GLUCOSE GEL     STEP 2: RECHECK blood sugar in 15 MINUTES STEP 3: If your blood sugar is still low at the 15 minute recheck --> then, go back to STEP 1 and treat AGAIN with another 15 grams of carbohydrates.

## 2020-10-30 NOTE — Progress Notes (Signed)
Name: Fred [name]  Age/ Sex: 79 y.o., male   MRN/ DOB: [id number], 05/23/1941     PCP: [name], MD   Reason for Endocrinology Evaluation: Type 2 Diabetes Mellitus  Initial Endocrine Consultative Visit: 03/21/2020    PATIENT IDENTIFIER: [name] is a 79 y.o. male with a past medical history of  HTN, T2DM,CAD and Dyslipidemia. The patient has followed with Endocrinology clinic since 03/21/2020 for consultative assistance with management of his diabetes.  DIABETIC HISTORY:  [name] was diagnosed with DM many years ago, he was initially on oral glycemic agents, but insulin had to be added due to  persistent hyperglycemiaand. His hemoglobin A1c has ranged from 7.8% in 2011, peaking at 9.0% in 2020.  Thyroid History :   He was noted to have an elevated TSH at 10.42 uIU/mL . He was started on LT-4 replacement in 02/2020.   SUBJECTIVE:   During the last visit (06/26/2020): A1c 8.8% . We increased Farxiga ,continued toujeo and metformin   Today (10/30/2020): [name] is here for a follow up on diabetes and hypothyroidism.   He checks his blood sugars 2 times daily, preprandial . The patient has had hypoglycemic episodes since the last clinic visit.  Denies recent UTI 's or genital infection  Denies nausea or diarrhea.    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 Coronary artery disease   . Diabetes mellitus without complication (HCC)   . High cholesterol   . Vertigo    Past Surgical History:  Past Surgical History:  Procedure Laterality Date  . CARDIAC CATHETERIZATION    . CORONARY ARTERY BYPASS GRAFT N/A 10/12/2019   Procedure: CORONARY ARTERY BYPASS GRAFTING (CABG) x 4 (LIMA to LAD, SVG to OM, SVG to RAMUS INTERMEDIATE, SVG to PDA) with EVH from RIGHT GREATER SAPHENOUS VEIN and LEFT INTERNAL MAMMARY ARTERY HARVEST;  Surgeon: Kerin Perna, MD;  Location: [facility] OR;  Service: Open Heart Surgery;  Laterality: N/A;  . FOOT SURGERY    . KNEE SURGERY    . LEFT HEART CATH AND CORONARY ANGIOGRAPHY N/A 10/11/2019   Procedure: LEFT HEART CATH AND CORONARY ANGIOGRAPHY;  Surgeon: Runell Gess, MD;  Location: [facility];  Service: Cardiovascular;  Laterality: N/A;  . NASAL SEPTUM SURGERY    . TEE WITHOUT CARDIOVERSION N/A 10/12/2019   Procedure: TRANSESOPHAGEAL ECHOCARDIOGRAM (TEE);  Surgeon: [name], Theron Arista, MD;  Location: [facility] OR;  Service: Open Heart Surgery;  Laterality: N/A;    Social History:  reports that he quit smoking about 57 years ago. His smoking use included cigarettes. He quit after 10.00 years of use. He has never used smokeless tobacco. He reports that he does not drink alcohol and does not use drugs.  Family History:  No family history on file.   HOME MEDICATIONS: Allergies as of 10/30/2020   No Known Allergies     Medication List       Accurate as of October 30, 2020 12:50 PM. If you have any questions, ask your nurse or doctor.        Accu-Chek Aviva device by Other route. Use as instructed 1 time daily   Accu-Chek FastClix Lancets Misc Use 1-4 times a day as needed/directed   Accu-Chek Guide test strip Generic drug: glucose blood 1 each by Other route daily. Use as instructed What changed: See the new instructions. Changed  by: Scarlette Shorts, MD   aspirin 325 MG EC tablet Take 1 tablet (325 mg total) by mouth daily.   clotrimazole 1 % cream Commonly known as: LOTRIMIN Apply 1 application topically as needed (Groin).   dapagliflozin propanediol 10 MG Tabs tablet Commonly known as: Farxiga Take 1 tablet (10 mg total) by mouth daily before breakfast.   Insulin Pen Needle 32G X 4 MM Misc 1 Device by Does not apply route daily. What changed:   medication strength  See the new instructions. Changed by: Scarlette Shorts, MD   levothyroxine 25 MCG tablet Commonly known as: SYNTHROID Take 1 tablet (25 mcg total) by mouth daily before breakfast.   metFORMIN 1000 MG tablet Commonly known as: GLUCOPHAGE Take 1,000 mg by mouth 2 (two) times daily with a meal.   metoprolol tartrate 25 MG tablet Commonly known as: LOPRESSOR TAKE ONE-HALF TABLET BY  MOUTH TWICE DAILY   multivitamin with minerals tablet Take 1 tablet by mouth daily.   nitroGLYCERIN 0.4 MG SL tablet Commonly known as: NITROSTAT Place 1 tablet (0.4 mg total) under the tongue every 5 (five) minutes as needed for chest pain.   simvastatin 40 MG tablet Commonly known as: ZOCOR Take 40 mg by mouth daily at 6 PM.   solifenacin 5 MG tablet Commonly known as: VESICARE Take 5 mg by mouth daily.   tamsulosin 0.4 MG Caps capsule Commonly known as: FLOMAX tamsulosin 0.4 mg capsule  TK 1 C PO QD   Toujeo Max SoloStar 300 UNIT/ML Solostar Pen Generic drug: insulin glargine (2 Unit Dial) Inject 48 Units into the skin daily.        OBJECTIVE:   Vital Signs: BP 136/80   Pulse 70   Ht 5\' 7"  (1.702 m)   Wt 197 lb 8 oz (89.6 kg)   SpO2 98%   BMI 30.93 kg/m   Wt Readings from Last 3 Encounters:  10/30/20 197 lb 8 oz (89.6 kg)  08/23/20 200 lb (90.7 kg)  06/26/20 202 lb 6.4 oz (91.8 kg)     Exam: General: Pt appears well and is in NAD  Lungs: Clear with good BS bilat with no rales, rhonchi, or wheezes  Heart: RRR with  normal S1 and S2 and no gallops; no murmurs; no rub  Extremities: No pretibial edema.   Neuro: MS is good with appropriate affect, pt is alert and Ox3    DM foot exam: 03/21/2020  The skin of the feet is without sores or ulcerations, plantar callous formation noted The pedal pulses are 2+ on right and 2+ on left. The sensation is decreased  to a screening 5.07, 10 gram monofilament bilaterally   DATA REVIEWED:  Lab Results  Component Value Date   HGBA1C 8.8 (A) 06/26/2020   HGBA1C 9.0 (H) 10/11/2019   Results for Fred [name], Fred [name] (MRN Fred [name]) as of 10/30/2020  12:50  Ref. Range 10/30/2020 08:18  Hemoglobin A1C Latest Ref Range: 4.0 - 5.6 % 7.5 (A)  Results for Fred [name], Fred [name] (MRN [id number]) as of 10/31/2020 13:49  Ref. Range 10/30/2020 08:51  Sodium Latest Ref Range: 135 - 145 mEq/L 139  Potassium Latest Ref Range: 3.5 - 5.1 mEq/L 4.6  Chloride Latest Ref Range: 96 - 112 mEq/L 104  CO2 Latest Ref Range: 19 - 32 mEq/L 28  Glucose Latest Ref Range: 70 - 99 mg/dL 003 (H)  BUN Latest Ref Range: 6 - 23 mg/dL 22  Creatinine Latest Ref Range: 0.40 - 1.50 mg/dL 7.04  Calcium Latest Ref Range: 8.4 - 10.5 mg/dL 9.3  GFR Latest Ref Range: >60.00 mL/min 62.72  MICROALB/CREAT RATIO Latest Ref Range: 0.0 - 30.0 mg/g 1.0  Creatinine,U Latest Units: mg/dL 88.8  Microalb, Ur Latest Ref Range: 0.0 - 1.9 mg/dL <9.1     ASSESSMENT / PLAN / RECOMMENDATIONS:   1) Type 2 Diabetes Mellitus, Poorly controlled, With Neuropathic and  Macrovascular  complications - Most recent A1c of 7.5 %. Goal A1c < 7.5 %.     - A1c at goal 7.5 %  - In review of his glucose meter he has been noted with hypoglycemia in the mornings and severe hyperglycemia at bed time up t0 289 mg/dL. His hyperglycemia is due to dietary indiscretions at bedtime snack ( cookies, candy and dessert) he was advise to portion control this.  - I am going to reduce basal insulin to reduce risk of hypoglycemia in the morning  - BMP  normal     MEDICATIONS:  Continue Farxiga 10 mg daily   Decrease  Toujeo 48 units daily   Continue Metformin 1000 mg BID   EDUCATION / INSTRUCTIONS:  BG monitoring instructions: Patient is instructed to check his blood sugars 2 times a day, fasting and bedtime .  Call [place] [facility] if: BG persistently < 70  . I reviewed the Rule of 15 for the treatment of hypoglycemia in detail with the patient. Literature supplied.   2) Diabetic complications:   Eye: Does not have known diabetic retinopathy.   Neuro/ Feet: Does  have known diabetic peripheral neuropathy .   Renal: Patient does not have known baseline CKD. He   is not on an ACEI/ARB at present. MA/Cr ratio is normal (10/2020)     3) Hypothyroidism:  -Patient is clinically euthyroid -He has been compliant with L T4 replacement - TFT's normal.     Medication Continue Levothyroxine 25 mcg daily     F/U in 4 months     Signed electronically by: Lyndle Herrlich, MD  [facility]  [facility] [address] [place] [place], [place] [address] Phone: [phone] FAX: [phone]   CC: [name], MD [address] [place] [address] Phone: [phone]  Fax: [phone]  Return to Endocrinology clinic as below: Future Appointments  Date Time Provider Department Center  11/07/2020  9:15 AM Runell Gess, MD [facility]  03/02/2021  8:30 AM Raquel Racey, Konrad Dolores, MD [facility] None

## 2020-10-31 ENCOUNTER — Telehealth: Payer: Self-pay | Admitting: Internal Medicine

## 2020-10-31 ENCOUNTER — Other Ambulatory Visit: Payer: Self-pay | Admitting: *Deleted

## 2020-10-31 LAB — MICROALBUMIN / CREATININE URINE RATIO
Creatinine,U: 71.2 mg/dL
Microalb Creat Ratio: 1 mg/g (ref 0.0–30.0)
Microalb, Ur: <0.7 mg/dL (ref 0.0–1.9)

## 2020-10-31 MED ORDER — ACCU-CHEK GUIDE VI STRP
ORAL_STRIP | 3 refills | Status: DC
Start: 2020-10-31 — End: 2020-11-01

## 2020-10-31 NOTE — Telephone Encounter (Signed)
Pharmacy:  [facility] Please sent ACCU -CHEK GUIDE test strip to [facility]. Was sent 10/30/20 to [facility] in error. Pt ph [phone]

## 2020-10-31 NOTE — Telephone Encounter (Signed)
Rx resent to [facility] per patient request.

## 2020-11-01 MED ORDER — ACCU-CHEK GUIDE VI STRP
ORAL_STRIP | 3 refills | Status: DC
Start: 2020-11-01 — End: 2021-09-12

## 2020-11-01 NOTE — Telephone Encounter (Signed)
Received a fax from [facility] for clarification. I have resent test strips as DAW

## 2020-11-07 ENCOUNTER — Encounter: Payer: Self-pay | Admitting: Cardiovascular Disease

## 2020-11-07 ENCOUNTER — Other Ambulatory Visit: Payer: Self-pay

## 2020-11-07 ENCOUNTER — Ambulatory Visit (INDEPENDENT_AMBULATORY_CARE_PROVIDER_SITE_OTHER): Payer: Medicare Other | Admitting: Cardiovascular Disease

## 2020-11-07 VITALS — BP 106/52 | HR 63 | Ht 67.0 in | Wt 197.0 lb

## 2020-11-07 DIAGNOSIS — I25118 Atherosclerotic heart disease of native coronary artery with other forms of angina pectoris: Secondary | ICD-10-CM

## 2020-11-07 DIAGNOSIS — Z951 Presence of aortocoronary bypass graft: Secondary | ICD-10-CM

## 2020-11-07 DIAGNOSIS — E782 Mixed hyperlipidemia: Secondary | ICD-10-CM

## 2020-11-07 NOTE — Assessment & Plan Note (Signed)
History of CAD status post cardiac catheterization which I performed as an outpatient 10/11/2019 revealing left main/three-vessel disease with preserved LV function.  He underwent CABG x4 by Dr. Maren Beach the following day with a LIMA to his LAD, vein to ramus branch, obtuse marginal branch and PDA.  He was discharged home 10/20/2019 and has been doing well since.  He was walking 3 miles a day prior to his operation and now he is up to walking 1 mile a day.

## 2020-11-07 NOTE — Assessment & Plan Note (Signed)
History of hyperlipidemia on high-dose statin therapy with lipid profile performed 01/24/2020 revealing total cholesterol 128, LDL 71 and HDL 32.

## 2020-11-07 NOTE — Progress Notes (Signed)
11/07/2020 TEKOA AMON   Jan 28, 1941  [id number]  Primary Physician [name], MD Primary Cardiologist: Runell Gess MD Nicholes Calam[place], [place]  HPI:  Fred [name] is a 79 y.o.  mildly overweight weight married Caucasian male father of 2, grandfather of 4 grandchildren who is referred by the ER and Dr. Tiburcio Pea for evaluation of dizziness. He retired in 2000 from working at [employer] in [place].I last saw him in the office  05/26/2020.His cardiac risk factors include treated diabetes and hyperlipidemia. He does not smoke. He does have a family history of heart disease with a mother that had CABG and a younger brother who died as well which was found in the evaluation of syncope. He is never had a heart attack or stroke. He denies chest pain or shortness of breath. He is fairly active and routinely walked 3 miles a day up until COVID-19 when he became more sedentary however recently he is try to increase his activity. On 08/30/2019 he went out for his morning run of 3 miles and at the end he felt weak. When driving back home he thought that he may have passed out but he was dizzy. He went to the ER where he was nauseated as well. ER evaluation was unrevealing. He said he felt a little bit of chest discomfort this morning when coming to the office but otherwise specifically denied the symptoms.  I performed event monitoring whichshowed predominantly sinus rhythm with occasional PACs, PVCs and short runs of PSVT.A coronary CTA did show three-vessel disease. Based on this, I recommended that we proceed with outpatient diagnostic coronary angiographywhich was done on 10/11/2019 revealing left main/three-vessel disease. The following day he underwent CABG x4 by Dr. Maren Beach with a LIMA to his LAD, vein to ramus branch, obtuse marginal branch and to the PDA. He was discharged home on 10/20/2019 and has been doing well since.  Since I saw him  in in the office 6 months ago he continues to do well.  He is up to walking 1 mile a day and his goal is to reach 3 miles a day which was his distance prior to bypass.  Was having some sternal pain and saw Dr. Maren Beach for this but this is not anginal.   Current Meds  Medication Sig  . Accu-Chek FastClix Lancets MISC Use 1-4 times a day as needed/directed  . ACCU-CHEK GUIDE test strip Use as instructed to check blood sugar once daily  . aspirin EC 325 MG EC tablet Take 1 tablet (325 mg total) by mouth daily.  . Blood Glucose Monitoring Suppl (ACCU-CHEK AVIVA) device by Other route. Use as instructed 1 time daily  . clotrimazole (LOTRIMIN) 1 % cream Apply 1 application topically as needed (Groin).  . dapagliflozin propanediol (FARXIGA) 10 MG TABS tablet Take 1 tablet (10 mg total) by mouth daily before breakfast.  . Insulin Pen Needle 32G X 4 MM MISC 1 Device by Does not apply route daily.  Marland Kitchen levothyroxine (SYNTHROID) 25 MCG tablet Take 1 tablet (25 mcg total) by mouth daily before breakfast.  . metFORMIN (GLUCOPHAGE) 1000 MG tablet Take 1,000 mg by mouth 2 (two) times daily with a meal.   . metoprolol tartrate (LOPRESSOR) 25 MG tablet TAKE ONE-HALF TABLET BY  MOUTH TWICE DAILY  . Multiple Vitamins-Minerals (MULTIVITAMIN WITH MINERALS) tablet Take 1 tablet by mouth daily.  . nitroGLYCERIN (NITROSTAT) 0.4 MG SL tablet Place 1 tablet (0.4 mg total) under the tongue  every 5 (five) minutes as needed for chest pain.  . simvastatin (ZOCOR) 40 MG tablet Take 40 mg by mouth daily at 6 PM.   . solifenacin (VESICARE) 5 MG tablet Take 5 mg by mouth daily.   . tamsulosin (FLOMAX) 0.4 MG CAPS capsule tamsulosin 0.4 mg capsule  TK 1 C PO QD  . TOUJEO MAX SOLOSTAR 300 UNIT/ML Solostar Pen Inject 48 Units into the skin daily.     No Known Allergies  Social History   Socioeconomic History  . Marital status: Married    Spouse name: Not on file  . Number of children: Not on file  . Years of education: 10   . Highest education level: Some college, no degree  Occupational History  . Occupation: Retired  Tobacco Use  . Smoking status: Former Smoker    Years: 10.00    Types: Cigarettes    Quit date: 12/1962    Years since quitting: 57.9  . Smokeless tobacco: Never Used  Substance and Sexual Activity  . Alcohol use: No  . Drug use: No  . Sexual activity: Not on file  Other Topics Concern  . Not on file  Social History Narrative  . Not on file   Social Determinants of Health   Financial Resource Strain:   . Difficulty of Paying Living Expenses: Not on file  Food Insecurity:   . Worried About Programme researcher, broadcasting/film/video in the Last Year: Not on file  . Ran Out of Food in the Last Year: Not on file  Transportation Needs:   . Lack of Transportation (Medical): Not on file  . Lack of Transportation (Non-Medical): Not on file  Physical Activity:   . Days of Exercise per Week: Not on file  . Minutes of Exercise per Session: Not on file  Stress:   . Feeling of Stress : Not on file  Social Connections:   . Frequency of Communication with Friends and Family: Not on file  . Frequency of Social Gatherings with Friends and Family: Not on file  . Attends Religious Services: Not on file  . Active Member of Clubs or Organizations: Not on file  . Attends Banker Meetings: Not on file  . Marital Status: Not on file  Intimate Partner Violence:   . Fear of Current or Ex-Partner: Not on file  . Emotionally Abused: Not on file  . Physically Abused: Not on file  . Sexually Abused: Not on file     Review of Systems: General: negative for chills, fever, night sweats or weight changes.  Cardiovascular: negative for chest pain, dyspnea on exertion, edema, orthopnea, palpitations, paroxysmal nocturnal dyspnea or shortness of breath Dermatological: negative for rash Respiratory: negative for cough or wheezing Urologic: negative for hematuria Abdominal: negative for nausea, vomiting, diarrhea,  bright red blood per rectum, melena, or hematemesis Neurologic: negative for visual changes, syncope, or dizziness All other systems reviewed and are otherwise negative except as noted above.    Blood pressure (!) 106/52, pulse 63, height 5\' 7"  (1.702 m), weight 197 lb (89.4 kg).  General appearance: alert and no distress Neck: no adenopathy, no carotid bruit, no JVD, supple, symmetrical, trachea midline and thyroid not enlarged, symmetric, no tenderness/mass/nodules Lungs: clear to auscultation bilaterally Heart: regular rate and rhythm, S1, S2 normal, no murmur, click, rub or gallop Extremities: extremities normal, atraumatic, no cyanosis or edema Pulses: 2+ and symmetric Skin: Skin color, texture, turgor normal. No rashes or lesions Neurologic: Alert and oriented X 3,  normal strength and tone. Normal symmetric reflexes. Normal coordination and gait  EKG sinus rhythm at 63 without ST or T wave changes.  I personally reviewed this EKG.  ASSESSMENT AND PLAN:   Hyperlipidemia History of hyperlipidemia on high-dose statin therapy with lipid profile performed 01/24/2020 revealing total cholesterol 128, LDL 71 and HDL 32.  S/P CABG x 4 History of CAD status post cardiac catheterization which I performed as an outpatient 10/11/2019 revealing left main/three-vessel disease with preserved LV function.  He underwent CABG x4 by Dr. Maren Beach the following day with a LIMA to his LAD, vein to ramus branch, obtuse marginal branch and PDA.  He was discharged home 10/20/2019 and has been doing well since.  He was walking 3 miles a day prior to his operation and now he is up to walking 1 mile a day.      Runell Gess MD FACP,FACC,FAHA, [facility] 11/07/2020 9:59 AM

## 2020-11-07 NOTE — Patient Instructions (Signed)

## 2020-11-26 ENCOUNTER — Other Ambulatory Visit: Payer: Self-pay | Admitting: Internal Medicine

## 2021-02-14 ENCOUNTER — Other Ambulatory Visit: Payer: Self-pay | Admitting: *Deleted

## 2021-02-14 DIAGNOSIS — Z951 Presence of aortocoronary bypass graft: Secondary | ICD-10-CM

## 2021-02-14 DIAGNOSIS — K439 Ventral hernia without obstruction or gangrene: Secondary | ICD-10-CM

## 2021-03-01 IMAGING — CT CT HEART MORP W/ CTA COR W/ SCORE W/ CA W/CM &/OR W/O CM
4 of 7 series · 8 of 20 positions shown, 9 images · IV contrast (APPLIED)
Comparison: None.
COMPARISON: None.
COMPARISON: None.

Addendum:
EXAM:
OVER-READ INTERPRETATION  CT CHEST

The following report is an over-read performed by radiologist Dr.
Haghighat Dind [REDACTED] on 09/21/2019. This
over-read does not include interpretation of cardiac or coronary
anatomy or pathology. The coronary calcium score/coronary CTA
interpretation by the cardiologist is attached.
TECHNIQUE: The patient was scanned on a Phillips Force scanner.
CLINICAL DATA: 78 year old male with family history of CAD
requiring CABG with abnormal coronary CT

[Series 6: best diast 71 % · axial · 0.39mm/px · z∈[+1600,+1643]mm · 2 of 318 slices shown]
[im 106/318  vessel]
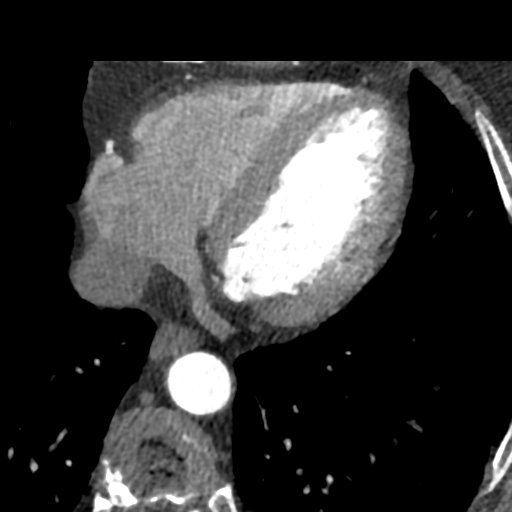
[im 212/318  vessel]
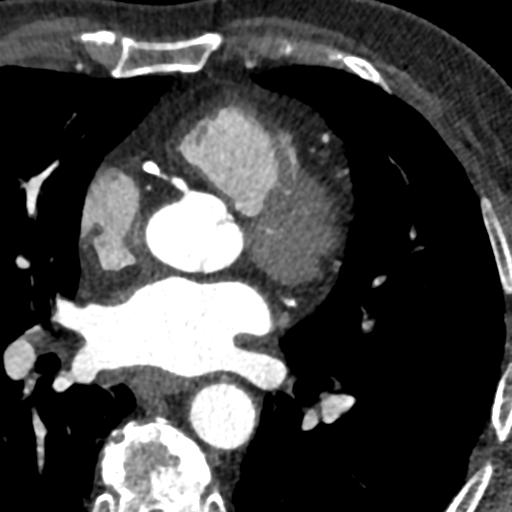

[Series 7: best syst · axial · 0.39mm/px · z∈[+1600,+1643]mm · 2 of 318 slices shown, 3 images]
[im 106/318  vessel]
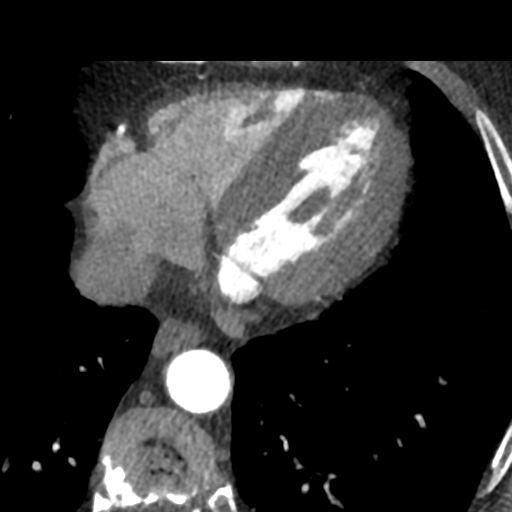
[im 106/318  lung]
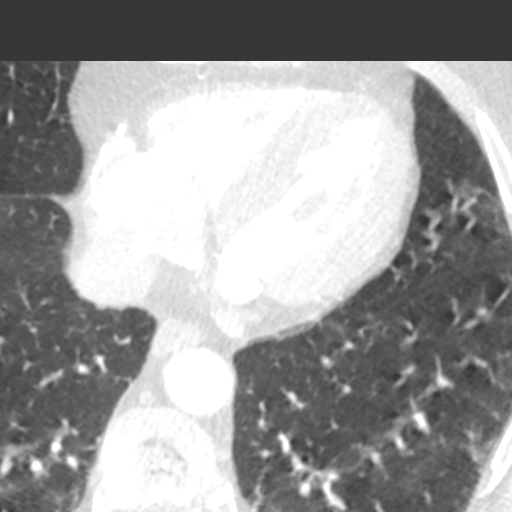
[im 212/318  vessel]
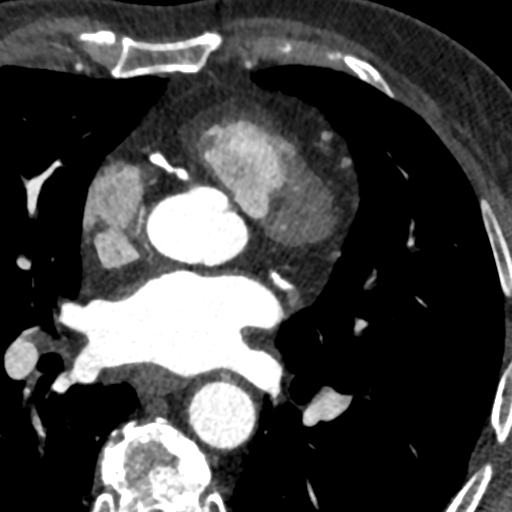

[Series 8: ts diast sharp 71 % · axial · 0.39mm/px · z∈[+1600,+1643]mm · 2 of 318 slices shown]
[im 106/318  lung]
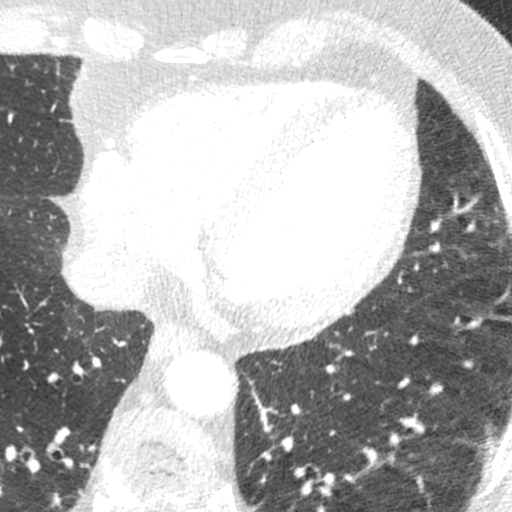
[im 212/318  lung]
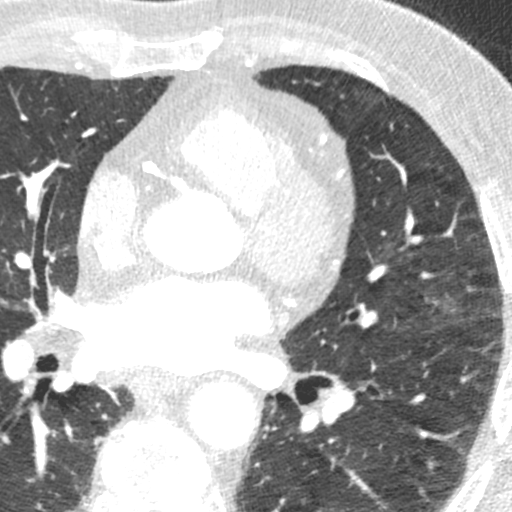

[Series 9: ts syst sharp · axial · 0.39mm/px · z∈[+1600,+1643]mm · 2 of 318 slices shown]
[im 106/318  lung]
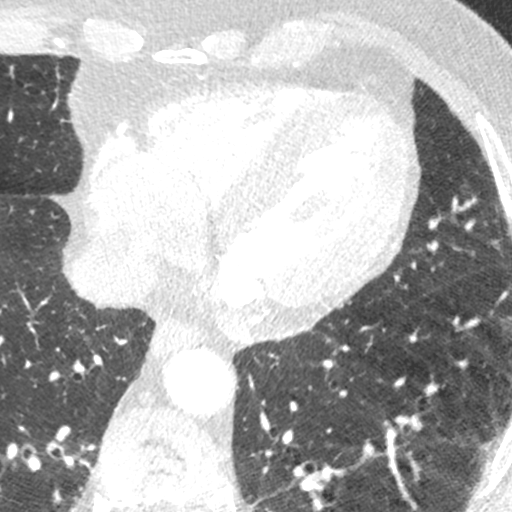
[im 212/318  lung]
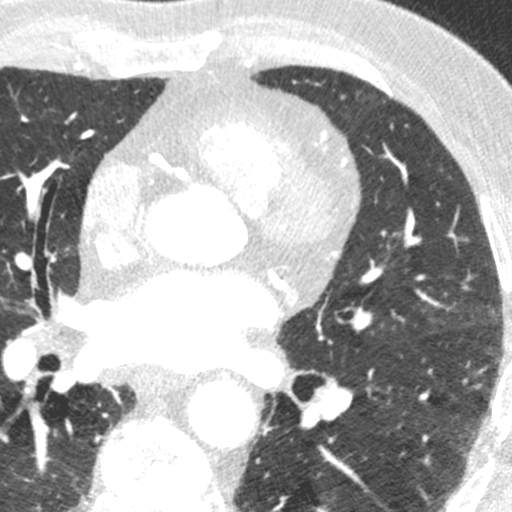

[8 of 20 positions shown; findings below may reference images not displayed]

FINDINGS: Within the visualized portions of the thorax there are no suspicious
appearing pulmonary nodules or masses, there is no acute
consolidative airspace disease, no pleural effusions, no
pneumothorax and no lymphadenopathy. Visualized portions of the
upper abdomen are unremarkable. There are no aggressive appearing
lytic or blastic lesions noted in the visualized portions of the
skeleton.
IMPRESSION: 1. No significant incidental noncardiac findings are noted.

EXAM:
Cardiac/Coronary  CTA
FINDINGS: A 100 kV prospective scan was triggered in the descending thoracic
aorta at 111 HU's. Axial non-contrast 3 mm slices were carried out
through the heart. The data set was analyzed on a dedicated work
station and scored using the Agatson method. Gantry rotation speed
was 250 msecs and collimation was .6 mm. 0.8 mg of sl NTG was given.
The 3D data set was reconstructed in 5% intervals of the 67-82 % of
the R-R cycle. Diastolic phases were analyzed on a dedicated work
station using MPR, MIP and VRT modes. The patient received 80 cc of
contrast.

Aorta:  Normal size.  No calcifications.  No dissection.

Aortic Valve:  Trileaflet.  No calcifications.

Coronary Arteries:  Normal coronary origin.  Right dominance.

RCA is a large dominant artery that gives rise to PDA and PLA. There
is dense calcified plaque throughout the proximal to mid vessel with
proximal lesion up to 75% just following first area of proximal
calcification.

Left main is a large artery that gives rise to LAD and LCX arteries.

LAD is a large vessel that gives rise to moderate size diagonal.
There is proximal dense calcified plaque with small caliber mid and
distal vessel. Potential severe lesion proximally.

LCX is a non-dominant artery that gives rise to one moderate sized
OM1 branch. There is dense calcification of the proximal vessel.
Unable to accurately estimate level of stenosis.

Other findings:

3 right sided and 2 left sided pulmonary veins drainage into the
left atrium.

Normal left atrial appendage without a thrombus.

Normal size of the pulmonary artery.
IMPRESSION: 1. Coronary calcium score of 7077. This was 81 percentile for age
and sex matched control.

2. Normal coronary origin with right dominance.

3. 3 vessel coronary artery disease with what appears to be flow
limiting stenosis in proximal LAD, RCA. Dense calcified plaque
present. Unable to accurately estimate circumflex stenosis due to
dense calcification proximally. CAD-RADS -4.

4.  No significant aortic atherosclerosis.

5. Recommend cardiac catheterization but prior to this will send for
FFR analysis.

EXAM:
CT FFR ANALYSIS
FINDINGS: FFRct analysis was performed on the original cardiac CT angiogram
dataset. Diagrammatic representation of the FFRct analysis is
provided in a separate PDF document in PACS. This dictation was
created using the PDF document and an interactive 3D model of the
results. 3D model is not available in the EMR/PACS. Normal FFR range
is >0.80.

1. Left Main:  No significant stenosis.

2. LAD: There is significant flow limiting stenosis (FFR 0.68-0.64)
approximately 8 mm distal to moderate sized first diagonal branch.
3. LCX: FFR is 0.79 (just below normal of 0.8) after the origin of
left circumflex.
4. RCA: There is significant flow limiting stenosis of proximal RCA
(FFR 0.66).
IMPRESSION: 1. CT FFR analysis demonstrates significant 3 vessel flow limiting
CAD.

*** End of Addendum ***
Addendum:
EXAM:
OVER-READ INTERPRETATION  CT CHEST

The following report is an over-read performed by radiologist Dr.
Haghighat Dind [REDACTED] on 09/21/2019. This
over-read does not include interpretation of cardiac or coronary
anatomy or pathology. The coronary calcium score/coronary CTA
interpretation by the cardiologist is attached.
FINDINGS: Within the visualized portions of the thorax there are no suspicious
appearing pulmonary nodules or masses, there is no acute
consolidative airspace disease, no pleural effusions, no
pneumothorax and no lymphadenopathy. Visualized portions of the
upper abdomen are unremarkable. There are no aggressive appearing
lytic or blastic lesions noted in the visualized portions of the
skeleton.
IMPRESSION: 1. No significant incidental noncardiac findings are noted.

EXAM:
Cardiac/Coronary  CTA
FINDINGS: A 100 kV prospective scan was triggered in the descending thoracic
aorta at 111 HU's. Axial non-contrast 3 mm slices were carried out
through the heart. The data set was analyzed on a dedicated work
station and scored using the Agatson method. Gantry rotation speed
was 250 msecs and collimation was .6 mm. 0.8 mg of sl NTG was given.
The 3D data set was reconstructed in 5% intervals of the 67-82 % of
the R-R cycle. Diastolic phases were analyzed on a dedicated work
station using MPR, MIP and VRT modes. The patient received 80 cc of
contrast.

Aorta:  Normal size.  No calcifications.  No dissection.

Aortic Valve:  Trileaflet.  No calcifications.

Coronary Arteries:  Normal coronary origin.  Right dominance.

RCA is a large dominant artery that gives rise to PDA and PLA. There
is dense calcified plaque throughout the proximal to mid vessel with
proximal lesion up to 75% just following first area of proximal
calcification.

Left main is a large artery that gives rise to LAD and LCX arteries.

LAD is a large vessel that gives rise to moderate size diagonal.
There is proximal dense calcified plaque with small caliber mid and
distal vessel. Potential severe lesion proximally.

LCX is a non-dominant artery that gives rise to one moderate sized
OM1 branch. There is dense calcification of the proximal vessel.
Unable to accurately estimate level of stenosis.

Other findings:

3 right sided and 2 left sided pulmonary veins drainage into the
left atrium.

Normal left atrial appendage without a thrombus.

Normal size of the pulmonary artery.
IMPRESSION: 1. Coronary calcium score of 7077. This was 81 percentile for age
and sex matched control.

2. Normal coronary origin with right dominance.

3. 3 vessel coronary artery disease with what appears to be flow
limiting stenosis in proximal LAD, RCA. Dense calcified plaque
present. Unable to accurately estimate circumflex stenosis due to
dense calcification proximally. CAD-RADS -4.

4.  No significant aortic atherosclerosis.

5. Recommend cardiac catheterization but prior to this will send for
FFR analysis.

*** End of Addendum ***
EXAM:
OVER-READ INTERPRETATION  CT CHEST

The following report is an over-read performed by radiologist Dr.
Haghighat Dind [REDACTED] on 09/21/2019. This
over-read does not include interpretation of cardiac or coronary
anatomy or pathology. The coronary calcium score/coronary CTA
interpretation by the cardiologist is attached.
FINDINGS: Within the visualized portions of the thorax there are no suspicious
appearing pulmonary nodules or masses, there is no acute
consolidative airspace disease, no pleural effusions, no
pneumothorax and no lymphadenopathy. Visualized portions of the
upper abdomen are unremarkable. There are no aggressive appearing
lytic or blastic lesions noted in the visualized portions of the
skeleton.
IMPRESSION: 1. No significant incidental noncardiac findings are noted.

## 2021-03-02 ENCOUNTER — Ambulatory Visit: Payer: Medicare Other | Admitting: Internal Medicine

## 2021-03-02 NOTE — Progress Notes (Deleted)
Name: Fred [name]  Age/ Sex: 80 y.o., male   MRN/ DOB: [id number], [birth year]     PCP: [name], MD   Reason for Endocrinology Evaluation: Type 2 Diabetes Mellitus  Initial Endocrine Consultative Visit: 03/21/2020    PATIENT IDENTIFIER: [name] is a 80 y.o. male with a past medical history of  HTN, T2DM,CAD and Dyslipidemia. The patient has followed with Endocrinology clinic since 03/21/2020 for consultative assistance with management of his diabetes.  DIABETIC HISTORY:  Mr. Tillery was diagnosed with DM many years ago, he was initially on oral glycemic agents, but insulin had to be added due to  persistent hyperglycemiaand. His hemoglobin A1c has ranged from 7.8% in 2011, peaking at 9.0% in 2020.  Thyroid History :   He was noted to have an elevated TSH at 10.42 uIU/mL . He was started on LT-4 replacement in 02/2020.   SUBJECTIVE:   During the last visit (10/30/2020): A1c 7.5% . We continued  Farxiga , and metformin and decreased Toujeo   Today (03/02/2021): [name] is here for a follow up on diabetes and hypothyroidism.   He checks his blood sugars 2 times daily, preprandial . The patient has had hypoglycemic episodes since the last clinic visit.  Denies recent UTI 's or genital infection  Denies nausea or diarrhea.    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 Coronary artery disease   . Diabetes mellitus without complication (HCC)   . High cholesterol   . Vertigo    Past Surgical History:  Past Surgical History:  Procedure Laterality Date  . CARDIAC CATHETERIZATION    . CORONARY ARTERY BYPASS GRAFT N/A 10/12/2019   Procedure: CORONARY ARTERY BYPASS GRAFTING (CABG) x 4 (LIMA to LAD, SVG to OM, SVG to RAMUS INTERMEDIATE, SVG to PDA) with EVH from RIGHT GREATER SAPHENOUS VEIN and LEFT INTERNAL MAMMARY ARTERY HARVEST;  Surgeon: Kerin Perna, MD;  Location: [facility] OR;  Service: Open Heart Surgery;  Laterality: N/A;  . FOOT SURGERY    . KNEE SURGERY    . LEFT HEART CATH AND CORONARY ANGIOGRAPHY N/A 10/11/2019   Procedure: LEFT HEART CATH AND CORONARY ANGIOGRAPHY;  Surgeon: Runell Gess, MD;  [facility];  Service: Cardiovascular;  Laterality: N/A;  . NASAL SEPTUM SURGERY    . TEE WITHOUT CARDIOVERSION N/A 10/12/2019   Procedure: TRANSESOPHAGEAL ECHOCARDIOGRAM (TEE);  Surgeon: [name], Theron Arista, MD;  Location: [facility] OR;  Service: Open Heart Surgery;  Laterality: N/A;    Social History:  reports that he quit smoking about 58 years ago. His smoking use included cigarettes. He quit after 10.00 years of use. He has never used smokeless tobacco. He reports that he does not drink alcohol and does not use drugs.  Family History: No family history on file.   HOME MEDICATIONS: Allergies as of 03/02/2021   No Known Allergies     Medication List       Accurate as of March 02, 2021  7:34 AM. If you have any questions, ask your nurse or doctor.        Accu-Chek Aviva device by Other route. Use as instructed 1 time daily   Accu-Chek FastClix Lancets Misc USE 1 TO 4 TIMES DAILY AS  NEEDED/DIRECTED   Accu-Chek Guide test strip Generic drug: glucose blood Use as instructed to check blood sugar once daily   aspirin 325 MG EC tablet Take 1 tablet (325  mg total) by mouth daily.   clotrimazole 1 % cream Commonly known as: LOTRIMIN Apply 1 application topically as needed (Groin).   dapagliflozin propanediol 10 MG Tabs tablet Commonly known as: Farxiga Take 1 tablet (10 mg total) by mouth daily before breakfast.   Insulin Pen Needle 32G X 4 MM Misc 1 Device by Does not apply route daily.   levothyroxine 25 MCG tablet Commonly known as: SYNTHROID Take 1 tablet (25 mcg total) by mouth daily before breakfast.   metFORMIN 1000 MG tablet Commonly known as: GLUCOPHAGE Take 1,000 mg by mouth 2 (two) times daily with a meal.   metoprolol tartrate 25 MG tablet Commonly known as: LOPRESSOR TAKE ONE-HALF TABLET BY  MOUTH TWICE DAILY   multivitamin with minerals tablet Take 1 tablet by mouth daily.   nitroGLYCERIN 0.4 MG SL tablet Commonly known as: NITROSTAT Place 1 tablet (0.4 mg total) under the tongue every 5 (five) minutes as needed for chest pain.   simvastatin 40 MG tablet Commonly known as: ZOCOR Take 40 mg by mouth daily at 6 PM.   solifenacin 5 MG tablet Commonly known as: VESICARE Take 5 mg by mouth daily.   tamsulosin 0.4 MG Caps capsule Commonly known as: FLOMAX tamsulosin 0.4 mg capsule  TK 1 C PO QD   Toujeo Max SoloStar 300 UNIT/ML Solostar Pen Generic drug: insulin glargine (2 Unit Dial) Inject 48 Units into the skin daily.        OBJECTIVE:   Vital Signs: There were no vitals taken for this visit.  Wt Readings from Last 3 Encounters:  11/07/20 197 lb (89.4 kg)  10/30/20 197 lb 8 oz (89.6 kg)  08/23/20 200 lb (90.7 kg)     Exam: General: Pt appears well and is in NAD  Lungs: Clear with good BS bilat with no rales, rhonchi, or wheezes  Heart: RRR with normal S1 and S2 and no gallops; no murmurs; no rub  Extremities: No pretibial edema.   Neuro: MS is good with appropriate affect, pt is alert and Ox3    DM foot exam: 03/21/2020  The skin of the feet is without sores or ulcerations, plantar  callous formation noted The pedal pulses are 2+ on right and 2+ on left. The sensation is decreased  to a screening 5.07, 10 gram monofilament bilaterally   DATA REVIEWED:  Lab Results  Component Value Date   HGBA1C 8.8 (A) 06/26/2020   HGBA1C 9.0 (H) 10/11/2019   Results for KEMANI, DEMARAIS (MRN [id number]) as of 10/30/2020 12:50  Ref. Range 10/30/2020 08:18  Hemoglobin A1C Latest Ref Range: 4.0 - 5.6 % 7.5 (A)  Results for MARQUIN, PATINO (MRN [id number]) as of 10/31/2020 13:49  Ref. Range 10/30/2020 08:51  Sodium Latest Ref Range: 135 - 145 mEq/L 139  Potassium Latest Ref Range:  3.5 - 5.1 mEq/L 4.6  Chloride Latest Ref Range: 96 - 112 mEq/L 104  CO2 Latest Ref Range: 19 - 32 mEq/L 28  Glucose Latest Ref Range: 70 - 99 mg/dL 474 (H)  BUN Latest Ref Range: 6 - 23 mg/dL 22  Creatinine Latest Ref Range: 0.40 - 1.50 mg/dL 2.59  Calcium Latest Ref Range: 8.4 - 10.5 mg/dL 9.3  GFR Latest Ref Range: >60.00 mL/min 62.72  MICROALB/CREAT RATIO Latest Ref Range: 0.0 - 30.0 mg/g 1.0  Creatinine,U Latest Units: mg/dL 56.3  Microalb, Ur Latest Ref Range: 0.0 - 1.9 mg/dL <8.7     ASSESSMENT / PLAN / RECOMMENDATIONS:   1) Type 2 Diabetes Mellitus, Poorly controlled, With Neuropathic and  Macrovascular  complications - Most recent A1c of 7.5 %. Goal A1c < 7.5 %.     - A1c at goal 7.5 %  - In review of his glucose meter he has been noted with hypoglycemia in the mornings and severe hyperglycemia at bed time up t0 289 mg/dL. His hyperglycemia is due to dietary indiscretions at bedtime snack ( cookies, candy and dessert) he was advise to portion control this.  - I am going to reduce basal insulin to reduce risk of hypoglycemia in the morning  - BMP normal     MEDICATIONS:  Continue Farxiga 10 mg daily   Decrease  Toujeo 48 units daily   Continue Metformin 1000 mg BID   EDUCATION / INSTRUCTIONS:  BG monitoring instructions: Patient is instructed to check his blood sugars 2 times a  day, fasting and bedtime .  Call [facility] if: BG persistently < 70  . I reviewed the Rule of 15 for the treatment of hypoglycemia in detail with the patient. Literature supplied.   2) Diabetic complications:   Eye: Does not have known diabetic retinopathy.   Neuro/ Feet: Does  have known diabetic peripheral neuropathy .   Renal: Patient does not have known baseline CKD. He   is not on an ACEI/ARB at present. MA/Cr ratio is normal (10/2020)     3) Hypothyroidism:  -Patient is clinically euthyroid -He has been compliant with L T4 replacement - TFT's normal.     Medication Continue Levothyroxine 25 mcg daily     F/U in 4 months     Signed electronically by: Lyndle Herrlich, MD  [facility] Endocrinology  [facility] [address] [place] [place], [place] [address] Phone: [phone] FAX: [phone]   CC: [name], MD [address] [place] [address] Phone: [phone]  Fax: [phone]  Return to Endocrinology clinic as below: Future Appointments  Date Time Provider Department Center  03/02/2021  8:30 AM Stephan Draughn, Konrad Dolores, MD [facility] None  04/06/2021  2:00 PM [facility] 1 [facility]  04/06/2021  3:30 PM Lightfoot, Eliezer Lofts, MD [facility]

## 2021-03-23 ENCOUNTER — Encounter: Payer: Medicare Other | Admitting: Thoracic Surgery (Cardiothoracic Vascular Surgery)

## 2021-03-23 ENCOUNTER — Other Ambulatory Visit: Payer: Medicare Other

## 2021-03-29 ENCOUNTER — Encounter: Payer: Medicare Other | Admitting: Cardiothoracic Surgery

## 2021-04-06 ENCOUNTER — Encounter: Payer: Medicare Other | Admitting: Thoracic Surgery (Cardiothoracic Vascular Surgery)

## 2021-04-06 ENCOUNTER — Other Ambulatory Visit: Payer: Medicare Other

## 2021-04-12 ENCOUNTER — Ambulatory Visit
Admission: RE | Admit: 2021-04-12 | Discharge: 2021-04-12 | Disposition: A | Discharge status: Home / Self Care | Payer: Medicare Other | Source: Ambulatory Visit | Attending: Thoracic Surgery (Cardiothoracic Vascular Surgery) | Admitting: Thoracic Surgery (Cardiothoracic Vascular Surgery)

## 2021-04-12 ENCOUNTER — Other Ambulatory Visit: Payer: Self-pay

## 2021-04-12 DIAGNOSIS — K439 Ventral hernia without obstruction or gangrene: Secondary | ICD-10-CM

## 2021-04-12 DIAGNOSIS — Z951 Presence of aortocoronary bypass graft: Secondary | ICD-10-CM

## 2021-04-12 MED ORDER — IOPAMIDOL (ISOVUE-300) INJECTION 61%
75.0000 mL | Freq: Once | INTRAVENOUS | Status: AC | PRN
  Administered 2021-04-12: 75 mL via INTRAVENOUS

## 2021-04-13 ENCOUNTER — Encounter: Payer: Self-pay | Admitting: Thoracic Surgery (Cardiothoracic Vascular Surgery)

## 2021-04-13 ENCOUNTER — Ambulatory Visit (INDEPENDENT_AMBULATORY_CARE_PROVIDER_SITE_OTHER): Payer: Medicare Other | Admitting: Thoracic Surgery (Cardiothoracic Vascular Surgery)

## 2021-04-13 ENCOUNTER — Other Ambulatory Visit: Payer: Self-pay | Admitting: Internal Medicine

## 2021-04-13 VITALS — BP 123/72 | HR 82 | Resp 20 | Ht 67.0 in | Wt 197.0 lb

## 2021-04-13 DIAGNOSIS — K439 Ventral hernia without obstruction or gangrene: Secondary | ICD-10-CM

## 2021-04-13 NOTE — Progress Notes (Signed)
      [address]       [place] [address]             [phone]        Fred [name] [facility] Medical Record #[id number] Date of Birth: [birth year]  Referring: [name], MD Primary Care: [name], MD Primary Cardiologist:[name], MD  Reason for visit:   follow-up  History of Present Illness:     80yo male s/p emergency CABG by Dr. [name] in Sept of 2021 presents for 6 month follow-up to discuss his lower incisional hernia.  He has no complaints.  He denies any pain.  Physical Exam: BP 123/72   Pulse 82   Resp 20   Ht 5\' 7"  (1.702 m)   Wt 197 lb (89.4 kg)   SpO2 97% Comment: RA  BMI 30.85 kg/m   Alert NAD Incision clear.  The lower border on the incision has a 4-5cm hernia.  No erythema.  Sternum stable Abdomen soft, ND    Diagnostic Studies & Laboratory data: CT chest: hernia at the lower border of the sternotomy.  Fat within the hernia    Assessment / Plan:   80 yo male with incision hernia.  No plains for repair at this point Will follow-up PRN   96 04/13/2021 12:45 PM

## 2021-07-07 ENCOUNTER — Other Ambulatory Visit: Payer: Self-pay | Admitting: Internal Medicine

## 2021-09-05 ENCOUNTER — Other Ambulatory Visit: Payer: Self-pay | Admitting: Cardiovascular Disease

## 2021-09-11 ENCOUNTER — Other Ambulatory Visit: Payer: Self-pay | Admitting: Internal Medicine

## 2021-09-30 ENCOUNTER — Other Ambulatory Visit: Payer: Self-pay | Admitting: Internal Medicine

## 2021-10-04 ENCOUNTER — Other Ambulatory Visit: Payer: Self-pay

## 2021-10-04 ENCOUNTER — Ambulatory Visit (INDEPENDENT_AMBULATORY_CARE_PROVIDER_SITE_OTHER): Payer: Medicare Other | Admitting: Internal Medicine

## 2021-10-04 ENCOUNTER — Encounter: Payer: Self-pay | Admitting: Internal Medicine

## 2021-10-04 VITALS — BP 130/80 | HR 65 | Ht 67.0 in | Wt 192.0 lb

## 2021-10-04 DIAGNOSIS — E1159 Type 2 diabetes mellitus with other circulatory complications: Secondary | ICD-10-CM | POA: Diagnosis not present

## 2021-10-04 DIAGNOSIS — Z23 Encounter for immunization: Secondary | ICD-10-CM

## 2021-10-04 DIAGNOSIS — Z794 Long term (current) use of insulin: Secondary | ICD-10-CM | POA: Diagnosis not present

## 2021-10-04 DIAGNOSIS — E1165 Type 2 diabetes mellitus with hyperglycemia: Secondary | ICD-10-CM | POA: Diagnosis not present

## 2021-10-04 DIAGNOSIS — E039 Hypothyroidism, unspecified: Secondary | ICD-10-CM

## 2021-10-04 DIAGNOSIS — E1142 Type 2 diabetes mellitus with diabetic polyneuropathy: Secondary | ICD-10-CM | POA: Diagnosis not present

## 2021-10-04 LAB — POCT GLYCOSYLATED HEMOGLOBIN (HGB A1C): Hemoglobin A1C: 8 % — AB (ref 4.0–5.6)

## 2021-10-04 MED ORDER — LEVOTHYROXINE SODIUM 25 MCG PO TABS: 25.0000 ug | ORAL_TABLET | Freq: Every day | ORAL | 3 refills | Status: DC

## 2021-10-04 MED ORDER — DAPAGLIFLOZIN PROPANEDIOL 10 MG PO TABS: 10.0000 mg | ORAL_TABLET | Freq: Every day | ORAL | 3 refills | Status: DC

## 2021-10-04 MED ORDER — TOUJEO MAX SOLOSTAR 300 UNIT/ML ~~LOC~~ SOPN: 44.0000 [IU] | PEN_INJECTOR | Freq: Every day | SUBCUTANEOUS | 6 refills | Status: DC

## 2021-10-04 NOTE — Progress Notes (Signed)
Name: Fred [name]  Age/ Sex: 80 y.o., male   MRN/ DOB: [id number], February 27, 1941     PCP: [name], MD   Reason for Endocrinology Evaluation: Type 2 Diabetes Mellitus  Initial Endocrine Consultative Visit: 03/21/2020    PATIENT IDENTIFIER: Fred [name] is a 80 y.o. male with a past medical history of  HTN, T2DM,CAD and Dyslipidemia. The patient has followed with Endocrinology clinic since 03/21/2020 for consultative assistance with management of his diabetes.  DIABETIC HISTORY:  Fred [name] was diagnosed with DM many years ago, he was initially on oral glycemic agents, but insulin had to be added due to  persistent hyperglycemiaand. His hemoglobin A1c has ranged from 7.8% in 2011, peaking at 9.0% in 2020.  Thyroid History :    He was noted to have an elevated TSH at 10.42 uIU/mL . He was started on LT-4 replacement in 02/2020.   SUBJECTIVE:   During the last visit (10/30/2020): A1c 7.5 % . We continued  Farxiga ,and Metformin and adjusted continued toujeo   Today (10/04/2021): Fred [name] is here for a follow up on diabetes and hypothyroidism.   He checks his blood sugars 2 times daily, preprandial . The patient has not had hypoglycemic episodes since the last clinic visit, but had tight BG's as low as 71 mg/dL    He has contacted a program to reverse diabetes ( [facility]) and started a detox program through them for 3 weeks, they help his morning metformin   Denies recent UTI 's or genital infection  Denies nausea or diarrhea.   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) Denies: PVD, CVA  HISTORY:  Past Medical History:  Past Medical History:  Diagnosis Date   Coronary artery disease    Diabetes mellitus without complication (HCC)    High cholesterol    Vertigo    Past Surgical History:  Past Surgical History:  Procedure Laterality Date   CARDIAC CATHETERIZATION     CORONARY ARTERY BYPASS GRAFT N/A 10/12/2019   Procedure: CORONARY ARTERY BYPASS GRAFTING (CABG) x 4 (LIMA to LAD, SVG to OM, SVG to RAMUS INTERMEDIATE, SVG to PDA) with EVH from RIGHT GREATER SAPHENOUS VEIN and LEFT INTERNAL MAMMARY ARTERY HARVEST;  Surgeon: Kerin Perna, MD;  Location: [facility] OR;  Service: Open Heart Surgery;  Laterality: N/A;   FOOT SURGERY     KNEE SURGERY     LEFT HEART CATH AND CORONARY ANGIOGRAPHY N/A 10/11/2019   Procedure: LEFT HEART CATH AND CORONARY ANGIOGRAPHY;  Surgeon: Runell Gess, MD;  [facility]: [facility];  Service: Cardiovascular;  Laterality: N/A;   NASAL SEPTUM SURGERY     TEE WITHOUT CARDIOVERSION N/A 10/12/2019   Procedure: TRANSESOPHAGEAL ECHOCARDIOGRAM (TEE);  Surgeon: [name], Theron Arista, MD;  Location: [facility] OR;  Service: Open Heart Surgery;  Laterality: N/A;   Social History:  reports that he quit smoking about 58 years ago. His smoking use included cigarettes. He has  never used smokeless tobacco. He reports that he does not drink alcohol and does not use drugs. Family History: No family history on file.   HOME MEDICATIONS: Allergies as of 10/04/2021   No Known Allergies      Medication List        Accurate as of October 04, 2021 11:31 AM. If you have any questions, ask your nurse or doctor.          Accu-Chek Aviva device by Other route. Use as instructed 1 time daily   Accu-Chek FastClix Lancets Misc USE 1 TO 4 TIMES DAILY AS  NEEDED/DIRECTED   Accu-Chek Guide test strip Generic drug: glucose blood USE AS DIRECTED ONCE DAILY  TO CHECK BLOOD  SUGAR   aspirin 325 MG EC tablet Take 1 tablet (325 mg total) by mouth daily.   clotrimazole 1 % cream Commonly known as: LOTRIMIN Apply 1 application topically as needed (Groin).   Farxiga 10 MG Tabs tablet Generic drug: dapagliflozin propanediol TAKE 1 TABLET BY MOUTH  DAILY BEFORE BREAKFAST   Insulin Pen Needle 32G X 4 MM Misc 1 Device by Does not apply route daily.   levothyroxine 25 MCG tablet Commonly known as: SYNTHROID Take 1 tablet (25 mcg total) by mouth daily before breakfast.   metFORMIN 1000 MG tablet Commonly known as: GLUCOPHAGE Take 1,000 mg by mouth 2 (two) times daily with a meal.   metoprolol tartrate 25 MG tablet Commonly known as: LOPRESSOR TAKE ONE-HALF TABLET BY  MOUTH TWICE DAILY   multivitamin with minerals tablet Take 1 tablet by mouth daily.   nitroGLYCERIN 0.4 MG SL tablet Commonly known as: NITROSTAT Place 1 tablet (0.4 mg total) under the tongue every 5 (five) minutes as needed for chest pain.   simvastatin 40 MG tablet Commonly known as: ZOCOR Take 40 mg by mouth daily at 6 PM.   solifenacin 5 MG tablet Commonly known as: VESICARE Take 5 mg by mouth daily.   tamsulosin 0.4 MG Caps capsule Commonly known as: FLOMAX tamsulosin 0.4 mg capsule  TK 1 C PO QD   Toujeo Max SoloStar 300 UNIT/ML Solostar Pen Generic drug: insulin glargine (2 Unit Dial) Inject 48 Units into the skin daily.         OBJECTIVE:   Vital Signs: BP 130/80 (BP Location: Left Arm, Patient Position: Sitting, Cuff Size: Small)   Pulse 65   Ht 5\' 7"  (1.702 m)   Wt 192 lb (87.1 kg)   SpO2 99%   BMI 30.07 kg/m   Wt Readings from Last 3 Encounters:  10/04/21 192 lb (87.1 kg)  04/13/21 197 lb (89.4 kg)  11/07/20 197 lb (89.4 kg)     Exam: General: Pt appears well and is in NAD  Lungs: Clear with good BS bilat with no rales, rhonchi, or wheezes  Heart: RRR   Extremities: No pretibial edema.   Neuro: MS is good with appropriate affect, pt is alert and  Ox3    DM foot exam: 10/04/2021   The skin of the feet is without sores or ulcerations, plantar callous formation noted The pedal pulses are 2+ on right and 2+ on left. The sensation is intact  to a screening 5.07, 10 gram monofilament bilaterally   DATA REVIEWED:  Lab Results  Component Value Date   HGBA1C 8.8 (A) 06/26/2020   HGBA1C 9.0 (H) 10/11/2019   Results for Fred [name], Fred [name] (MRN [name]) as of 10/30/2020 12:50  Ref. Range 10/30/2020 08:18  Hemoglobin A1C Latest Ref  Range: 4.0 - 5.6 % 7.5 (A)  Results for Fred [name], Fred [name] (MRN [id number]) as of 10/31/2020 13:49  Ref. Range 10/30/2020 08:51  Sodium Latest Ref Range: 135 - 145 mEq/L 139  Potassium Latest Ref Range: 3.5 - 5.1 mEq/L 4.6  Chloride Latest Ref Range: 96 - 112 mEq/L 104  CO2 Latest Ref Range: 19 - 32 mEq/L 28  Glucose Latest Ref Range: 70 - 99 mg/dL 528 (H)  BUN Latest Ref Range: 6 - 23 mg/dL 22  Creatinine Latest Ref Range: 0.40 - 1.50 mg/dL 4.13  Calcium Latest Ref Range: 8.4 - 10.5 mg/dL 9.3  GFR Latest Ref Range: >60.00 mL/min 62.72  MICROALB/CREAT RATIO Latest Ref Range: 0.0 - 30.0 mg/g 1.0  Creatinine,U Latest Units: mg/dL 24.4  Microalb, Ur Latest Ref Range: 0.0 - 1.9 mg/dL <0.1   U2V 8.7 % on 01/13/3663 at PCP office    ASSESSMENT / PLAN / RECOMMENDATIONS:   1) Type 2 Diabetes Mellitus, Poorly controlled, With Neuropathic and  Macrovascular  complications - Most recent A1c of 8.0 %. Goal A1c < 7.5 %.     - A1c was 8.7 %  in September while at [facility] office, he started a detox program to reverse diabetes and his BG's have been trending down.  - He has been noted with tight BG's as low as 71 mg/dL will reduce basal insulin as below  - [facility] as asked him to hold his morning metformin which I explained to him I don;t agree with. We discussed the risks and benefits of Metformin and insulin and I explained to him that I would prefer reducing the insulin as this is associated with  hypoglycemia which is more dangerous for him.   - I have asked him to increased Metformin BID and will reduce insulin as below     MEDICATIONS: Continue Farxiga 10 mg daily  Decrease  Toujeo 44 units daily  Continue Metformin 1000 mg BID   EDUCATION / INSTRUCTIONS: BG monitoring instructions: Patient is instructed to check his blood sugars 2 times a day, fasting and bedtime . Call [facility] [facility] if: BG persistently < 70  I reviewed the Rule of 15 for the treatment of hypoglycemia in detail with the patient. Literature supplied.   2) Diabetic complications:  Eye: Does not have known diabetic retinopathy.  Neuro/ Feet: Does  have known diabetic peripheral neuropathy .  Renal: Patient does not have known baseline CKD. He   is not on an ACEI/ARB at present. MA/Cr ratio is normal (10/2020)     3) Hypothyroidism:   -Patient is clinically euthyroid -He has been compliant with L T4 replacement - TFT's normal, will check next visit      Medication Continue Levothyroxine 25 mcg daily  4) Peripheral Neuropathy :  - Pt may use OTC capsaicin cream   F/U in 4 months    I spent 25 minutes preparing to see the patient by review of recent labs, imaging and procedures, obtaining and reviewing separately obtained history, communicating with the patient, ordering medications, and documenting clinical information in the EHR including the differential Dx, treatment, and any further evaluation and other management   Signed electronically by: Lyndle Herrlich, MD  [facility] Endocrinology  [facility] [address] [place] [place], [place] [address] Phone: [phone] FAX: [phone]   CC: [name], MD [address] [place] [address] Phone: [phone]  Fax: [phone]  Return to Endocrinology clinic as  below: Future Appointments  Date Time Provider Department Center  11/07/2021 10:00 AM Runell Gess, MD  [facility]

## 2021-10-04 NOTE — Patient Instructions (Signed)
-   Decrease Toujeo to 44 units daily  - Continue Farxiga 10 mg daily with Breakfast  - Continue Metformin 1000 mg twice daily    HOW TO TREAT LOW BLOOD SUGARS (Blood sugar LESS THAN 70 MG/DL) Please follow the RULE OF 15 for the treatment of hypoglycemia treatment (when your (blood sugars are less than 70 mg/dL)   STEP 1: Take 15 grams of carbohydrates when your blood sugar is low, which includes:  3-4 GLUCOSE TABS  OR 3-4 OZ OF JUICE OR REGULAR SODA OR ONE TUBE OF GLUCOSE GEL    STEP 2: RECHECK blood sugar in 15 MINUTES STEP 3: If your blood sugar is still low at the 15 minute recheck --> then, go back to STEP 1 and treat AGAIN with another 15 grams of carbohydrates.

## 2021-10-05 ENCOUNTER — Telehealth: Payer: Self-pay | Admitting: Internal Medicine

## 2021-10-05 MED ORDER — TOUJEO MAX SOLOSTAR 300 UNIT/ML ~~LOC~~ SOPN
44.0000 [IU] | PEN_INJECTOR | Freq: Every day | SUBCUTANEOUS | 6 refills | Status: DC
Start: 2021-10-05 — End: 2022-06-03

## 2021-10-05 MED ORDER — DAPAGLIFLOZIN PROPANEDIOL 10 MG PO TABS: 10.0000 mg | ORAL_TABLET | Freq: Every day | ORAL | 3 refills | Status: DC

## 2021-10-05 MED ORDER — LEVOTHYROXINE SODIUM 25 MCG PO TABS: 25.0000 ug | ORAL_TABLET | Freq: Every day | ORAL | 3 refills | Status: DC

## 2021-10-05 NOTE — Telephone Encounter (Signed)
Patient called to advise that [place], levothyroxine, and Toujeo needed to be sent to [facility] not [facility].  Please correct

## 2021-10-05 NOTE — Telephone Encounter (Signed)
Scripts sent to [facility].

## 2021-11-07 ENCOUNTER — Ambulatory Visit (INDEPENDENT_AMBULATORY_CARE_PROVIDER_SITE_OTHER): Payer: Medicare Other | Admitting: Cardiovascular Disease

## 2021-11-07 ENCOUNTER — Other Ambulatory Visit: Payer: Self-pay

## 2021-11-07 ENCOUNTER — Encounter: Payer: Self-pay | Admitting: Cardiovascular Disease

## 2021-11-07 DIAGNOSIS — Z951 Presence of aortocoronary bypass graft: Secondary | ICD-10-CM

## 2021-11-07 DIAGNOSIS — E782 Mixed hyperlipidemia: Secondary | ICD-10-CM | POA: Diagnosis not present

## 2021-11-07 MED ORDER — ASPIRIN EC 81 MG PO TBEC: 81.0000 mg | DELAYED_RELEASE_TABLET | Freq: Every day | ORAL | Status: AC

## 2021-11-07 NOTE — Assessment & Plan Note (Signed)
History of hyperlipidemia on statin therapy with lipid profile performed 02/05/2021 revealing total cholesterol 145, LDL of 85 and HDL 35.

## 2021-11-07 NOTE — Assessment & Plan Note (Signed)
History of CAD status post cardiac catheterization by myself 10/11/2019 revealing left main/three-vessel disease.  The following day he underwent CABG x4 by Dr. Maren Beach with a LIMA to his LAD, vein to ramus branch, obtuse marginal branch and PDA.  He is done well since.  He denies chest pain or shortness of breath.

## 2021-11-07 NOTE — Progress Notes (Signed)
11/07/2021 Fred [name]   [birth year]  EW:[id number]  Primary Physician Fred Frees, MD Primary Cardiologist: Fred Harp MD Fred [name], [place]  HPI:  Fred [name] is a 80 y.o.  mildly overweight weight married Caucasian male father of 2, grandfather of 4 grandchildren who is referred by the ER and Dr. Kenton [name] for evaluation of dizziness.  He retired in 2000 from working at [employer] in [employer].  I last saw him in the office 11/07/2020. His cardiac risk factors include treated diabetes and hyperlipidemia.  He does not smoke.  He does have a family history of heart disease with a mother that had CABG and a younger brother who died as well which was found in the evaluation of syncope.  He is never had a heart attack or stroke.  He denies chest pain or shortness of breath.  He is fairly active and routinely walked 3 miles a day up until COVID-19 when he became more sedentary however recently he is try to increase his activity.  On 08/30/2019 he went out for his morning run of 3 miles and at the end he felt weak.  When driving back home he thought that he may have passed out but he was dizzy.  He went to the ER where he was nauseated as well.  ER evaluation was unrevealing.  He said he felt a little bit of chest discomfort this morning when coming to the office but otherwise specifically denied the symptoms.   I performed event monitoring which showed predominantly sinus rhythm with occasional PACs, PVCs and short runs of PSVT. A coronary CTA did show three-vessel disease.  Based on this, I recommended that we proceed with outpatient diagnostic coronary angiography which was done on 10/11/2019 revealing left main/three-vessel disease.  The following day he underwent CABG x4 by Dr. Darcey [name] with a LIMA to his LAD, vein to ramus branch, obtuse marginal branch and to the PDA.  He was discharged home on 10/20/2019 and has been doing well since.   Since I saw him in  in the office a year ago he is done well.  He did see Dr. Kipp [name] in the office over the summer for a incisional hernia that Dr. Kipp [name] thought could be managed conservatively.  He does not walk as much as he used to.  He denies chest pain or shortness of breath.   Current Meds  Medication Sig   Accu-Chek FastClix Lancets MISC USE 1 TO 4 TIMES DAILY AS  NEEDED/DIRECTED   ACCU-CHEK GUIDE test strip USE AS DIRECTED ONCE DAILY  TO CHECK BLOOD SUGAR   aspirin EC 325 MG EC tablet Take 1 tablet (325 mg total) by mouth daily.   Blood Glucose Monitoring Suppl (ACCU-CHEK AVIVA) device by Other route. Use as instructed 1 time daily   dapagliflozin propanediol (FARXIGA) 10 MG TABS tablet Take 1 tablet (10 mg total) by mouth daily before breakfast.   Insulin Pen Needle 32G X 4 MM MISC 1 Device by Does not apply route daily.   levothyroxine (SYNTHROID) 25 MCG tablet Take 1 tablet (25 mcg total) by mouth daily before breakfast.   metoprolol tartrate (LOPRESSOR) 25 MG tablet TAKE ONE-HALF TABLET BY  MOUTH TWICE DAILY   Multiple Vitamins-Minerals (MULTIVITAMIN WITH MINERALS) tablet Take 1 tablet by mouth daily.   simvastatin (ZOCOR) 40 MG tablet Take 40 mg by mouth daily at 6 PM.    solifenacin (VESICARE) 5 MG tablet Take 5 mg by  mouth daily.    tamsulosin (FLOMAX) 0.4 MG CAPS capsule tamsulosin 0.4 mg capsule  TK 1 C PO QD   TOUJEO MAX SOLOSTAR 300 UNIT/ML Solostar Pen Inject 44 Units into the skin daily.     No Known Allergies  Social History   Socioeconomic History   Marital status: Married    Spouse name: Not on file   Number of children: Not on file   Years of education: 12   Highest education level: Some college, no degree  Occupational History   Occupation: Retired  Tobacco Use   Smoking status: Former    Years: 10.00    Types: Cigarettes    Quit date: 12/1962    Years since quitting: 58.9   Smokeless tobacco: Never  Substance and Sexual Activity   Alcohol use: No   Drug use: No    Sexual activity: Not on file  Other Topics Concern   Not on file  Social History Narrative   Not on file   Social Determinants of Health   Financial Resource Strain: Not on file  Food Insecurity: Not on file  Transportation Needs: Not on file  Physical Activity: Not on file  Stress: Not on file  Social Connections: Not on file  Intimate Partner Violence: Not on file     Review of Systems: General: negative for chills, fever, night sweats or weight changes.  Cardiovascular: negative for chest pain, dyspnea on exertion, edema, orthopnea, palpitations, paroxysmal nocturnal dyspnea or shortness of breath Dermatological: negative for rash Respiratory: negative for cough or wheezing Urologic: negative for hematuria Abdominal: negative for nausea, vomiting, diarrhea, bright red blood per rectum, melena, or hematemesis Neurologic: negative for visual changes, syncope, or dizziness All other systems reviewed and are otherwise negative except as noted above.    Blood pressure (!) 104/52, pulse (!) 55, height 5\' 7"  (1.702 m), weight 196 lb 3.2 oz (89 kg), SpO2 97 %.  General appearance: alert and no distress Neck: no adenopathy, no carotid bruit, no JVD, supple, symmetrical, trachea midline, and thyroid not enlarged, symmetric, no tenderness/mass/nodules Lungs: clear to auscultation bilaterally Heart: regular rate and rhythm, S1, S2 normal, no murmur, click, rub or gallop Extremities: extremities normal, atraumatic, no cyanosis or edema Pulses: 2+ and symmetric Skin: Skin color, texture, turgor normal. No rashes or lesions Neurologic: Grossly normal  EKG sinus bradycardia 55 without ST or T wave changes.  I personally reviewed this EKG.  ASSESSMENT AND PLAN:   Hyperlipidemia History of hyperlipidemia on statin therapy with lipid profile performed 02/05/2021 revealing total cholesterol 145, LDL of 85 and HDL 35.  S/P CABG x 4 History of CAD status post cardiac catheterization by  myself 10/11/2019 revealing left main/three-vessel disease.  The following day he underwent CABG x4 by Dr. 13/01/2019 with a LIMA to his LAD, vein to ramus branch, obtuse marginal branch and PDA.  He is done well since.  He denies chest pain or shortness of breath.     Maren Beach MD FACP,FACC,FAHA, [facility] 11/07/2021 10:18 AM

## 2021-11-07 NOTE — Addendum Note (Signed)
Addended by: Bernita Buffy on: 11/07/2021 10:23 AM   Modules accepted: Orders

## 2021-11-07 NOTE — Patient Instructions (Signed)

## 2021-12-14 ENCOUNTER — Other Ambulatory Visit: Payer: Self-pay | Admitting: Cardiovascular Disease

## 2022-02-04 ENCOUNTER — Ambulatory Visit: Payer: Medicare Other | Admitting: Internal Medicine

## 2022-02-04 ENCOUNTER — Encounter: Payer: Self-pay | Admitting: Internal Medicine

## 2022-02-04 ENCOUNTER — Other Ambulatory Visit: Payer: Self-pay

## 2022-02-04 VITALS — BP 122/74 | HR 64 | Ht 67.0 in | Wt 193.0 lb

## 2022-02-04 DIAGNOSIS — Z794 Long term (current) use of insulin: Secondary | ICD-10-CM

## 2022-02-04 DIAGNOSIS — E1159 Type 2 diabetes mellitus with other circulatory complications: Secondary | ICD-10-CM | POA: Diagnosis not present

## 2022-02-04 DIAGNOSIS — E1165 Type 2 diabetes mellitus with hyperglycemia: Secondary | ICD-10-CM | POA: Diagnosis not present

## 2022-02-04 DIAGNOSIS — E1142 Type 2 diabetes mellitus with diabetic polyneuropathy: Secondary | ICD-10-CM

## 2022-02-04 LAB — POCT GLYCOSYLATED HEMOGLOBIN (HGB A1C): Hemoglobin A1C: 9.5 % — AB (ref 4.0–5.6)

## 2022-02-04 MED ORDER — TRULICITY 0.75 MG/0.5ML ~~LOC~~ SOAJ: 0.7500 mg | SUBCUTANEOUS | 3 refills | Status: DC

## 2022-02-04 NOTE — Patient Instructions (Addendum)
-   Continue Toujeo 44 units daily  - Continue Farxiga 10 mg daily with Breakfast  - Continue Metformin 1000 mg twice daily  - Start Trulicity [id number] mg weekly    HOW TO TREAT LOW BLOOD SUGARS (Blood sugar LESS THAN 70 MG/DL) Please follow the RULE OF 15 for the treatment of hypoglycemia treatment (when your (blood sugars are less than 70 mg/dL)   STEP 1: Take 15 grams of carbohydrates when your blood sugar is low, which includes:  3-4 GLUCOSE TABS  OR 3-4 OZ OF JUICE OR REGULAR SODA OR ONE TUBE OF GLUCOSE GEL    STEP 2: RECHECK blood sugar in 15 MINUTES STEP 3: If your blood sugar is still low at the 15 minute recheck --> then, go back to STEP 1 and treat AGAIN with another 15 grams of carbohydrates.

## 2022-02-04 NOTE — Progress Notes (Signed)
Name: Fred [name]  Age/ Sex: 81 y.o., male   MRN/ DOB: [id number], [birth year]     PCP: [name], MD   Reason for Endocrinology Evaluation: Type 2 Diabetes Mellitus  Initial Endocrine Consultative Visit: 03/21/2020    PATIENT IDENTIFIER: Fred [name] is a 81 y.o. male with a past medical history of  HTN, T2DM,CAD and Dyslipidemia. The patient has followed with Endocrinology clinic since 03/21/2020 for consultative assistance with management of his diabetes.  DIABETIC HISTORY:  Fred [name] was diagnosed with DM many years ago, he was initially on oral glycemic agents, but insulin had to be added due to  persistent hyperglycemiaand. His hemoglobin A1c has ranged from 7.8% in 2011, peaking at 9.0% in 2020.     Thyroid History :    He was noted to have an elevated TSH at 10.42 uIU/mL . He was started on LT-4 replacement in 02/2020.   SUBJECTIVE:   During the last visit (10/04/2021): A1c 8.0 % . We continued  Farxiga , Metformin and adjusted toujeo   Today (02/04/2022): Fred [name] is here for a follow up on diabetes and hypothyroidism.   He checks his blood sugars 2 times daily, preprandial . The patient has not had hypoglycemic episodes since the last clinic visit, but had tight BG's as low as 71 mg/dL    He has contacted a program to reverse diabetes ( [facility]) and started a detox program through them  He has lost his wife 12/2021 Denies recent UTI 's or genital infection  Denies nausea or diarrhea, but has constipation Continues with tingling and numbness of the feet      HOME DIABETES REGIMEN:  Farxiga 10 mg daily with Breakfast  Toujeo 44 units daily  Metformin 1000 mg twice daily - Holds it if BG less then 150 mg/dL       Statin: Yes ACE-I/ARB: no    GLUCOSE LOG:  113-189 mg/dL    DIABETIC COMPLICATIONS: Microvascular complications:   Mild tingling of feet Denies: CKD, retinopathy Last eye exam: Completed 2021      Macrovascular complications:  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) Denies: PVD, CVA  HISTORY:  Past Medical History:  Past Medical History:  Diagnosis Date   Coronary artery disease    Diabetes mellitus without complication (HCC)    High cholesterol    Vertigo    Past Surgical History:  Past Surgical History:  Procedure Laterality Date   CARDIAC CATHETERIZATION     CORONARY ARTERY BYPASS GRAFT N/A 10/12/2019   Procedure: CORONARY ARTERY BYPASS GRAFTING (CABG) x 4 (LIMA to LAD, SVG to OM, SVG to RAMUS INTERMEDIATE, SVG to PDA) with EVH from RIGHT GREATER SAPHENOUS VEIN and LEFT INTERNAL MAMMARY ARTERY HARVEST;  Surgeon: Kerin Perna, MD;  Location: [facility] OR;  Service: Open Heart Surgery;  Laterality: N/A;   FOOT SURGERY     KNEE SURGERY     LEFT HEART CATH AND CORONARY ANGIOGRAPHY N/A 10/11/2019   Procedure: LEFT HEART CATH AND CORONARY ANGIOGRAPHY;  Surgeon: Runell Gess, MD;  Location: [facility] INVASIVE CV LAB;  Service: Cardiovascular;  Laterality: N/A;   NASAL SEPTUM SURGERY     TEE WITHOUT CARDIOVERSION N/A 10/12/2019   Procedure: TRANSESOPHAGEAL ECHOCARDIOGRAM (TEE);  Surgeon: [name], Theron Arista, MD;  Location[facility];  Service: Open Heart Surgery;  Laterality: N/A;   Social History:  reports that he quit smoking about 59 years ago. His smoking use included cigarettes. He has never used smokeless tobacco.  He reports that he does not drink alcohol and does not use drugs. Family History: No family history on file.   HOME MEDICATIONS: Allergies as of 02/04/2022   No Known Allergies      Medication List        Accurate as of February 04, 2022 11:11 AM. If you have any questions, ask your nurse or doctor.          Accu-Chek Aviva device by Other route. Use as instructed 1 time daily   Accu-Chek FastClix Lancets Misc USE 1 TO 4 TIMES DAILY AS  NEEDED/DIRECTED   Accu-Chek Guide test strip Generic drug: glucose blood USE AS DIRECTED ONCE DAILY  TO CHECK BLOOD SUGAR    aspirin EC 81 MG tablet Take 1 tablet (81 mg total) by mouth daily.   clotrimazole 1 % cream Commonly known as: LOTRIMIN Apply 1 application topically as needed (Groin).   dapagliflozin propanediol 10 MG Tabs tablet Commonly known as: Farxiga Take 1 tablet (10 mg total) by mouth daily before breakfast.   Insulin Pen Needle 32G X 4 MM Misc 1 Device by Does not apply route daily.   levothyroxine 25 MCG tablet Commonly known as: SYNTHROID Take 1 tablet (25 mcg total) by mouth daily before breakfast.   metFORMIN 1000 MG tablet Commonly known as: GLUCOPHAGE Take 1,000 mg by mouth 2 (two) times daily with a meal.   metoprolol tartrate 25 MG tablet Commonly known as: LOPRESSOR TAKE ONE-HALF TABLET BY  MOUTH TWICE DAILY   multivitamin with minerals tablet Take 1 tablet by mouth daily.   nitroGLYCERIN 0.4 MG SL tablet Commonly known as: NITROSTAT Place 1 tablet (0.4 mg total) under the tongue every 5 (five) minutes as needed for chest pain.   simvastatin 40 MG tablet Commonly known as: ZOCOR Take 40 mg by mouth daily at 6 PM.   solifenacin 5 MG tablet Commonly known as: VESICARE Take 5 mg by mouth daily.   tamsulosin 0.4 MG Caps capsule Commonly known as: FLOMAX tamsulosin 0.4 mg capsule  TK 1 C PO QD   Toujeo Max SoloStar 300 UNIT/ML Solostar Pen Generic drug: insulin glargine (2 Unit Dial) Inject 44 Units into the skin daily.   Trulicity 0.75 MG/0.5ML Sopn Generic drug: Dulaglutide Inject 0.75 mg into the skin once a week. Started by: Scarlette Shorts, MD         OBJECTIVE:   Vital Signs: BP 122/74 (BP Location: Left Arm, Patient Position: Sitting, Cuff Size: Small)    Pulse 64    Ht 5\' 7"  (1.702 m)    Wt 193 lb (87.5 kg)    SpO2 98%    BMI 30.23 kg/m   Wt Readings from Last 3 Encounters:  02/04/22 193 lb (87.5 kg)  11/07/21 196 lb 3.2 oz (89 kg)  10/04/21 192 lb (87.1 kg)     Exam: General: Pt appears well and is in NAD  Lungs: Clear with good  BS bilat with no rales, rhonchi, or wheezes  Heart: RRR   Extremities: No pretibial edema.   Neuro: MS is good with appropriate affect, pt is alert and Ox3    DM foot exam: 10/04/2021   The skin of the feet is without sores or ulcerations, plantar callous formation noted The pedal pulses are 2+ on right and 2+ on left. The sensation is intact  to a screening 5.07, 10 gram monofilament bilaterally   DATA REVIEWED:   Latest Reference Range & Units 02/04/22 09:15  Hemoglobin A1C  4.0 - 5.6 % 9.5 !  !: Data is abnormal  A1c 8.7 % on 08/14/2021 at PCP office    ASSESSMENT / PLAN / RECOMMENDATIONS:   1) Type 2 Diabetes Mellitus, Poorly controlled, With Neuropathic and  Macrovascular  complications - Most recent A1c of 9.5  %. Goal A1c < 7.5 %.    Poorly controlled diabetes due to dietary indiscretion and medication nonadherence -He continues to hold his metformin if his BG <150 mg/dl , I have again discussed this with him and discouraged him from holding medications when his BG's are normal  -No hx of pancreatitis, will start Trulicity , discussed GI side effects  - Farxiga cost-prohibitive - pt assistance papers provided    MEDICATIONS: Continue Farxiga 10 mg daily  Decrease  Toujeo 44 units daily  Continue Metformin 1000 mg BID  Start Trulicity 0.75 mg weekly   EDUCATION / INSTRUCTIONS: BG monitoring instructions: Patient is instructed to check his blood sugars 2 times a day, fasting and bedtime . Call [facility] if: BG persistently < 70  I reviewed the Rule of 15 for the treatment of hypoglycemia in detail with the patient. Literature supplied.   2) Diabetic complications:  Eye: Does not have known diabetic retinopathy.  Neuro/ Feet: Does  have known diabetic peripheral neuropathy .  Renal: Patient does not have known baseline CKD. He   is not on an ACEI/ARB at present. MA/Cr ratio is normal (10/2020)    F/U in 4 months     Signed electronically  by: Lyndle Herrlich, MD  [facility] Endocrinology  [facility] [address] [place] [place], [place] [address] Phone: [phone] FAX: [phone]   CC: [name], MD [address] [place] [address] Phone: [phone]  Fax: [phone]  Return to Endocrinology clinic as below: Future Appointments  Date Time Provider Department Center  06/03/2022  9:10 AM Mackenzye Mackel, Konrad Dolores, MD [id number] None

## 2022-02-05 ENCOUNTER — Telehealth: Payer: Self-pay | Admitting: Internal Medicine

## 2022-02-05 NOTE — Telephone Encounter (Signed)
Patient dropped of PT Assistance - AZ&ME completed paperwork.

## 2022-02-06 ENCOUNTER — Other Ambulatory Visit: Payer: Self-pay | Admitting: Internal Medicine

## 2022-02-08 ENCOUNTER — Telehealth: Payer: Self-pay

## 2022-02-08 NOTE — Telephone Encounter (Signed)
AZ&ME patient assistance application has been filled out and faxed. ?

## 2022-02-13 ENCOUNTER — Other Ambulatory Visit: Payer: Self-pay | Admitting: Internal Medicine

## 2022-03-05 ENCOUNTER — Other Ambulatory Visit: Payer: Self-pay

## 2022-03-05 ENCOUNTER — Other Ambulatory Visit: Payer: Self-pay | Admitting: Family Medicine

## 2022-03-05 ENCOUNTER — Ambulatory Visit
Admission: RE | Admit: 2022-03-05 | Discharge: 2022-03-05 | Disposition: A | Discharge status: Home / Self Care | Payer: Self-pay | Source: Ambulatory Visit | Attending: Family Medicine | Admitting: Family Medicine

## 2022-03-05 DIAGNOSIS — M545 Low back pain, unspecified: Secondary | ICD-10-CM

## 2022-03-11 ENCOUNTER — Telehealth: Payer: Self-pay

## 2022-03-11 NOTE — Telephone Encounter (Signed)
Appeal form for the AZ&ME application has been faxed per patient request.  ?

## 2022-06-03 ENCOUNTER — Ambulatory Visit (INDEPENDENT_AMBULATORY_CARE_PROVIDER_SITE_OTHER): Payer: Medicare Other | Admitting: Internal Medicine

## 2022-06-03 ENCOUNTER — Encounter: Payer: Self-pay | Admitting: Internal Medicine

## 2022-06-03 VITALS — BP 120/72 | HR 70 | Ht 67.0 in | Wt 196.8 lb

## 2022-06-03 DIAGNOSIS — E039 Hypothyroidism, unspecified: Secondary | ICD-10-CM | POA: Diagnosis not present

## 2022-06-03 DIAGNOSIS — E1142 Type 2 diabetes mellitus with diabetic polyneuropathy: Secondary | ICD-10-CM

## 2022-06-03 DIAGNOSIS — Z794 Long term (current) use of insulin: Secondary | ICD-10-CM

## 2022-06-03 DIAGNOSIS — E1165 Type 2 diabetes mellitus with hyperglycemia: Secondary | ICD-10-CM | POA: Diagnosis not present

## 2022-06-03 DIAGNOSIS — E1159 Type 2 diabetes mellitus with other circulatory complications: Secondary | ICD-10-CM | POA: Diagnosis not present

## 2022-06-03 LAB — POCT GLYCOSYLATED HEMOGLOBIN (HGB A1C): Hemoglobin A1C: 8.1 % — AB (ref 4.0–5.6)

## 2022-06-03 MED ORDER — DAPAGLIFLOZIN PROPANEDIOL 10 MG PO TABS: 10.0000 mg | ORAL_TABLET | Freq: Every day | ORAL | 3 refills | Status: DC

## 2022-06-03 MED ORDER — INSULIN PEN NEEDLE 32G X 4 MM MISC: 1.0000 | Freq: Every day | 3 refills | Status: DC

## 2022-06-03 MED ORDER — TRULICITY 1.5 MG/0.5ML ~~LOC~~ SOAJ: 1.5000 mg | SUBCUTANEOUS | 3 refills | Status: DC

## 2022-06-03 MED ORDER — METFORMIN HCL 1000 MG PO TABS: 1000.0000 mg | ORAL_TABLET | Freq: Two times a day (BID) | ORAL | 3 refills | Status: DC

## 2022-06-03 MED ORDER — LEVOTHYROXINE SODIUM 25 MCG PO TABS: 25.0000 ug | ORAL_TABLET | Freq: Every day | ORAL | 3 refills | Status: DC

## 2022-06-03 MED ORDER — TOUJEO MAX SOLOSTAR 300 UNIT/ML ~~LOC~~ SOPN: 42.0000 [IU] | PEN_INJECTOR | Freq: Every day | SUBCUTANEOUS | 6 refills | Status: DC

## 2022-09-18 ENCOUNTER — Other Ambulatory Visit: Payer: Self-pay

## 2022-09-18 MED ORDER — DAPAGLIFLOZIN PROPANEDIOL 10 MG PO TABS: 10.0000 mg | ORAL_TABLET | Freq: Every day | ORAL | 3 refills | Status: DC

## 2022-12-04 ENCOUNTER — Encounter: Payer: Self-pay | Admitting: Internal Medicine

## 2022-12-04 ENCOUNTER — Ambulatory Visit (INDEPENDENT_AMBULATORY_CARE_PROVIDER_SITE_OTHER): Payer: Medicare Other | Admitting: Internal Medicine

## 2022-12-04 ENCOUNTER — Telehealth: Payer: Self-pay | Admitting: Internal Medicine

## 2022-12-04 VITALS — BP 120/68 | HR 76 | Ht 67.0 in | Wt 196.0 lb

## 2022-12-04 DIAGNOSIS — E1165 Type 2 diabetes mellitus with hyperglycemia: Secondary | ICD-10-CM | POA: Diagnosis not present

## 2022-12-04 DIAGNOSIS — E1159 Type 2 diabetes mellitus with other circulatory complications: Secondary | ICD-10-CM

## 2022-12-04 DIAGNOSIS — M25511 Pain in right shoulder: Secondary | ICD-10-CM | POA: Diagnosis not present

## 2022-12-04 DIAGNOSIS — E785 Hyperlipidemia, unspecified: Secondary | ICD-10-CM

## 2022-12-04 DIAGNOSIS — E1142 Type 2 diabetes mellitus with diabetic polyneuropathy: Secondary | ICD-10-CM

## 2022-12-04 DIAGNOSIS — Z794 Long term (current) use of insulin: Secondary | ICD-10-CM

## 2022-12-04 DIAGNOSIS — M25512 Pain in left shoulder: Secondary | ICD-10-CM

## 2022-12-04 LAB — LIPID PANEL
Cholesterol: 151 mg/dL (ref 0–200)
HDL: 32.7 mg/dL — ABNORMAL LOW (ref >39.00)
NonHDL: 117.88
Total CHOL/HDL Ratio: 5
Triglycerides: 397 mg/dL — ABNORMAL HIGH (ref 0.0–149.0)
VLDL: 79.4 mg/dL — ABNORMAL HIGH (ref 0.0–40.0)

## 2022-12-04 LAB — BASIC METABOLIC PANEL
BUN: 25 mg/dL — ABNORMAL HIGH (ref 6–23)
CO2: 28 mEq/L (ref 19–32)
Calcium: 9.7 mg/dL (ref 8.4–10.5)
Chloride: 101 mEq/L (ref 96–112)
Creatinine, Ser: 1.08 mg/dL (ref 0.40–1.50)
GFR: 63.87 mL/min (ref >60.00)
Glucose, Bld: 250 mg/dL — ABNORMAL HIGH (ref 70–99)
Potassium: 4.3 mEq/L (ref 3.5–5.1)
Sodium: 138 mEq/L (ref 135–145)

## 2022-12-04 LAB — TSH: TSH: 2.93 u[IU]/mL (ref 0.35–5.50)

## 2022-12-04 LAB — POCT GLYCOSYLATED HEMOGLOBIN (HGB A1C): Hemoglobin A1C: 8.5 % — AB (ref 4.0–5.6)

## 2022-12-04 LAB — LDL CHOLESTEROL, DIRECT: Direct LDL: 51 mg/dL

## 2022-12-04 MED ORDER — TRULICITY 3 MG/0.5ML ~~LOC~~ SOAJ: 3.0000 mg | SUBCUTANEOUS | 3 refills | Status: DC

## 2022-12-04 NOTE — Telephone Encounter (Signed)
Patient dropped off completed Patient Assistance - Farxiga with financials attached.  Placed in MD box at front desk

## 2022-12-04 NOTE — Patient Instructions (Signed)
-   Continue Toujeo 42 units daily  - Continue Farxiga 10 mg daily with Breakfast  - Continue Metformin 1000 mg twice daily  - Increase  Trulicity 3 mg weekly    HOW TO TREAT LOW BLOOD SUGARS (Blood sugar LESS THAN 70 MG/DL) Please follow the RULE OF 15 for the treatment of hypoglycemia treatment (when your (blood sugars are less than 70 mg/dL)   STEP 1: Take 15 grams of carbohydrates when your blood sugar is low, which includes:  3-4 GLUCOSE TABS  OR 3-4 OZ OF JUICE OR REGULAR SODA OR ONE TUBE OF GLUCOSE GEL    STEP 2: RECHECK blood sugar in 15 MINUTES STEP 3: If your blood sugar is still low at the 15 minute recheck --> then, go back to STEP 1 and treat AGAIN with another 15 grams of carbohydrates.

## 2022-12-04 NOTE — Progress Notes (Unsigned)
Name: Fred [name]  Age/ Sex: 81 y.o., male   MRN/ DOB: [id number], [birth year]     PCP: [name], MD   Reason for Endocrinology Evaluation: Type 2 Diabetes Mellitus  Initial Endocrine Consultative Visit: 03/21/2020    PATIENT IDENTIFIER: Fred [name] is a 81 y.o. male with a past medical history of  HTN, T2DM,CAD and Dyslipidemia. The patient has followed with Endocrinology clinic since 03/21/2020 for consultative assistance with management of his diabetes.  DIABETIC HISTORY:  Mr. Guastella was diagnosed with DM many years ago, he was initially on oral glycemic agents, but insulin had to be added due to  persistent hyperglycemiaand. His hemoglobin A1c has ranged from 7.8% in 2011, peaking at 9.0% in 2020.  Started  Trulicity 01/2022   Thyroid History :    He was noted to have an elevated TSH at 10.42 uIU/mL . He was started on LT-4 replacement in 02/2020.   SUBJECTIVE:   During the last visit (06/03/2022): A1c 8.1%     Today (12/04/2022): [name] is here for a follow up on diabetes and hypothyroidism.   He checks his blood sugars multiple times daily through  dexcom. The patient has not had hypoglycemic episodes since the last clinic visit.  Denies nausea, vomiting or diarrhea   He continues with eating pies, candy   He is c/o bilateral should pains for months, worse on the right, no injury , takes tylenol 650 mg Bid without relief   HOME DIABETES REGIMEN:  Farxiga 10 mg daily with Breakfast - through pt assistance  Toujeo 42 units daily  Metformin 1000 mg twice daily Trulicity 1.5 mg weekly ( Sunday)       Statin: Yes ACE-I/ARB: no   CONTINUOUS GLUCOSE MONITORING RECORD INTERPRETATION    Dates of Recording: 12/14-12/27/2023  Sensor description:dexcom  Results statistics:   CGM use % of time 93  Average and SD 196/57  Time in range    46    %  % Time Above 180 36  % Time above 250 18  % Time Below target 0   Glycemic patterns summary:  BG's optimal overnight and high during the day   Hyperglycemic episodes  postprandial   Hypoglycemic episodes occurred n/a  Overnight periods: optimal     DIABETIC COMPLICATIONS: Microvascular complications:   Mild tingling of feet Denies: CKD, retinopathy Last eye exam: Completed 2023     Macrovascular complications:  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) Denies: PVD, CVA  HISTORY:  Past Medical History:  Past Medical History:  Diagnosis Date   Coronary artery disease    Diabetes mellitus without complication (HCC)    High cholesterol    Vertigo    Past Surgical History:  Past Surgical History:  Procedure Laterality Date   CARDIAC CATHETERIZATION     CORONARY ARTERY BYPASS GRAFT N/A 10/12/2019   Procedure: CORONARY ARTERY BYPASS GRAFTING (CABG) x 4 (LIMA to LAD, SVG to OM, SVG to RAMUS INTERMEDIATE, SVG to PDA) with EVH from RIGHT GREATER SAPHENOUS VEIN and LEFT INTERNAL MAMMARY ARTERY HARVEST;  Surgeon: Kerin Perna, MD;  Location: [facility];  Service: Open Heart Surgery;  Laterality: N/A;   FOOT SURGERY     KNEE SURGERY     LEFT HEART CATH AND CORONARY ANGIOGRAPHY N/A 10/11/2019   Procedure: LEFT HEART CATH AND CORONARY ANGIOGRAPHY;  Surgeon: Runell Gess, MD;  Location: [facility] INVASIVE CV LAB;  Service: Cardiovascular;  Laterality: N/A;   NASAL SEPTUM SURGERY  TEE WITHOUT CARDIOVERSION N/A 10/12/2019   Procedure: TRANSESOPHAGEAL ECHOCARDIOGRAM (TEE);  Surgeon: [name], Theron Arista, MD;  Location: [facility] OR;  Service: Open Heart Surgery;  Laterality: N/A;   Social History:  reports that he quit smoking about 60 years ago. His smoking use included cigarettes. He has never used smokeless tobacco. He reports that he does not drink alcohol and does not use drugs. Family History: No family history on file.   HOME MEDICATIONS: Allergies as of 12/04/2022   No Known Allergies      Medication List        Accurate as of December 04, 2022  9:34 AM. If you have any questions, ask  your nurse or doctor.          Accu-Chek Aviva device by Other route. Use as instructed 1 time daily   Accu-Chek FastClix Lancets Misc USE 1 TO 4 TIMES DAILY AS  NEEDED/DIRECTED   Accu-Chek Guide test strip Generic drug: glucose blood CHECK BLOOD SUGAR DAILY AS  DIRECTED   aspirin EC 81 MG tablet Take 1 tablet (81 mg total) by mouth daily.   clotrimazole 1 % cream Commonly known as: LOTRIMIN Apply 1 application  topically as needed (Groin).   dapagliflozin propanediol 10 MG Tabs tablet Commonly known as: Farxiga Take 1 tablet (10 mg total) by mouth daily before breakfast.   Insulin Pen Needle 32G X 4 MM Misc 1 Device by Does not apply route daily.   levothyroxine 25 MCG tablet Commonly known as: SYNTHROID Take 1 tablet (25 mcg total) by mouth daily before breakfast.   metFORMIN 1000 MG tablet Commonly known as: GLUCOPHAGE Take 1 tablet (1,000 mg total) by mouth 2 (two) times daily with a meal.   metoprolol tartrate 25 MG tablet Commonly known as: LOPRESSOR TAKE ONE-HALF TABLET BY  MOUTH TWICE DAILY   multivitamin with minerals tablet Take 1 tablet by mouth daily.   nitroGLYCERIN 0.4 MG SL tablet Commonly known as: NITROSTAT Place 1 tablet (0.4 mg total) under the tongue every 5 (five) minutes as needed for chest pain.   simvastatin 40 MG tablet Commonly known as: ZOCOR Take 40 mg by mouth daily at 6 PM.   solifenacin 5 MG tablet Commonly known as: VESICARE Take 5 mg by mouth daily.   tamsulosin 0.4 MG Caps capsule Commonly known as: FLOMAX tamsulosin 0.4 mg capsule  TK 1 C PO QD   Toujeo Max SoloStar 300 UNIT/ML Solostar Pen Generic drug: insulin glargine (2 Unit Dial) Inject 42 Units into the skin daily.   Trulicity 1.5 MG/0.5ML Sopn Generic drug: Dulaglutide Inject 1.5 mg into the skin once a week.         OBJECTIVE:   Vital Signs: BP 120/68   Pulse 76   Ht 5\' 7"  (1.702 m)   Wt 196 lb (88.9 kg)   SpO2 97%   BMI 30.70 kg/m   Wt  Readings from Last 3 Encounters:  12/04/22 196 lb (88.9 kg)  06/03/22 196 lb 12.8 oz (89.3 kg)  02/04/22 193 lb (87.5 kg)     Exam: General: Pt appears well and is in NAD  Lungs: Clear with good BS bilat with no rales, rhonchi, or wheezes  Heart: RRR   Extremities: No pretibial edema.   Neuro: MS is good with appropriate affect, pt is alert and Ox3    DM foot exam: 12/04/2022 The skin of the feet is without sores or ulcerations, plantar callous formation noted The pedal pulses are 2+ on right  and 2+ on left. The sensation is intact  to a screening 5.07, 10 gram monofilament bilaterally   DATA REVIEWED:  8.1 Abnormal   9.5 Abnormal   8.0 Abnormal   7.5 Abnormal   8.8 Abnormal   9.0 High      03/05/2022 BUN/CR 24/0.970 GFR 78 LDL 67 TSH 3.150   ASSESSMENT / PLAN / RECOMMENDATIONS:   1) Type 2 Diabetes Mellitus, Sub-OPtimally controlled, With Neuropathic and  Macrovascular  complications - Most recent A1c of 8.5  %. Goal A1c < 7.5 %.      -Patient continues with hyperglycemia due to dietary indiscretion, as he continues to eat pies and candy bars, patient was counseled about reducing amount of carbohydrates and avoiding sweets but he believes this is difficult -In reviewing his Dexcom download, the patient has been noted with optimal BG's at night but hyperglycemia is noted with meals, this was reviewed with the patient and this is why we recommend reducing carbohydrate intake, I did explain to the patient that if he continues with worsening glycemic control we may have to add a prandial insulin to take with each meal - Will increase trulicity since he is tolerating it well  -Patient was provided with patient assistance forms for [name] as it has been cost prohibitive  MEDICATIONS: Continue Farxiga 10 mg daily  Continue Toujeo 42 units daily  Continue Metformin 1000 mg BID  Increase Trulicity 3.0 mg weekly   EDUCATION / INSTRUCTIONS: BG monitoring instructions: Patient  is instructed to check his blood sugars 2 times a day, fasting and bedtime . Call [facility] if: BG persistently < 70  I reviewed the Rule of 15 for the treatment of hypoglycemia in detail with the patient. Literature supplied.   2) Diabetic complications:  Eye: Does not have known diabetic retinopathy.  Neuro/ Feet: Does  have known diabetic peripheral neuropathy .  Renal: Patient does not have known baseline CKD. He   is not on an ACEI/ARB at present. MA/Cr ratio is normal 02/2022)  2) Hypothyroidism:  - TSH at 3.150 uIU/mL on 03/05/2022 - NO changes    Medication  Levothyroxine 25 mcg daily   3) Peripheral Neuropathy: -Patient has mild symptoms of neuropathy of the feet.  We discussed gabapentin, we discussed risk of drowsiness and lower extremity swelling -We have opted to hold off on this treatment since it is mild and tolerable, patient may try OTC topical remedies such as capsaicin cream  F/U in 6 months     Signed electronically by: Lyndle Herrlich, MD  [facility]  [facility] [address] [place], [place] [address] Phone: [phone] FAX: [phone]   CC: [name], MD [address] [place] [address] Phone: [phone]  Fax: [phone]  Return to Endocrinology clinic as below: Future Appointments  Date Time Provider Department Center  12/17/2022  9:00 AM Runell Gess, MD [facility] None

## 2022-12-05 MED ORDER — LEVOTHYROXINE SODIUM 25 MCG PO TABS: 25.0000 ug | ORAL_TABLET | Freq: Every day | ORAL | 3 refills | Status: DC

## 2022-12-06 NOTE — Telephone Encounter (Signed)
Forms have been completed and faxed.

## 2022-12-11 NOTE — Progress Notes (Unsigned)
    Benito Mccreedy D.[place] Phone: [phone]   Assessment and Plan:     There are no diagnoses linked to this encounter.  ***   Pertinent previous records reviewed include ***   Follow Up: ***     Subjective:   I, Yaqueline Gutter, am serving as a Education administrator for Doctor Glennon Mac  Chief Complaint: bilateral shoulder pain   HPI:   12/12/2022 Patient is a 82 year old male complaining of bilateral shoulder pain. Patient states  Relevant Historical Information: ***  Additional pertinent review of systems negative.   Current Outpatient Medications:    Accu-Chek FastClix Lancets MISC, USE 1 TO 4 TIMES DAILY AS  NEEDED/DIRECTED, Disp: 408 each, Rfl: 3   ACCU-CHEK GUIDE test strip, CHECK BLOOD SUGAR DAILY AS  DIRECTED, Disp: 100 strip, Rfl: 3   aspirin 81 MG tablet, Take 1 tablet (81 mg total) by mouth daily., Disp: , Rfl:    Blood Glucose Monitoring Suppl (ACCU-CHEK AVIVA) device, by Other route. Use as instructed 1 time daily, Disp: , Rfl:    clotrimazole (LOTRIMIN) 1 % cream, Apply 1 application  topically as needed (Groin)., Disp: , Rfl:    dapagliflozin propanediol (FARXIGA) 10 MG TABS tablet, Take 1 tablet (10 mg total) by mouth daily before breakfast., Disp: 90 tablet, Rfl: 3   Dulaglutide (TRULICITY) 3 XB/9.3JQ SOPN, Inject 3 mg as directed once a week., Disp: 6 mL, Rfl: 3   Insulin Pen Needle 32G X 4 MM MISC, 1 Device by Does not apply route daily., Disp: 100 each, Rfl: 3   levothyroxine (SYNTHROID) 25 MCG tablet, Take 1 tablet (25 mcg total) by mouth daily before breakfast., Disp: 90 tablet, Rfl: 3   metFORMIN (GLUCOPHAGE) 1000 MG tablet, Take 1 tablet (1,000 mg total) by mouth 2 (two) times daily with a meal., Disp: 180 tablet, Rfl: 3   metoprolol tartrate (LOPRESSOR) 25 MG tablet, TAKE ONE-HALF TABLET BY  MOUTH TWICE DAILY, Disp: 60 tablet, Rfl: 5   Multiple Vitamins-Minerals (MULTIVITAMIN WITH  MINERALS) tablet, Take 1 tablet by mouth daily., Disp: , Rfl:    nitroGLYCERIN (NITROSTAT) 0.4 MG SL tablet, Place 1 tablet (0.4 mg total) under the tongue every 5 (five) minutes as needed for chest pain., Disp: 25 tablet, Rfl: 3   simvastatin (ZOCOR) 40 MG tablet, Take 40 mg by mouth daily at 6 PM. , Disp: , Rfl:    solifenacin (VESICARE) 5 MG tablet, Take 5 mg by mouth daily. , Disp: , Rfl:    tamsulosin (FLOMAX) 0.4 MG CAPS capsule, tamsulosin 0.4 mg capsule  TK 1 C PO QD, Disp: , Rfl:    TOUJEO MAX SOLOSTAR 300 UNIT/ML Solostar Pen, Inject 42 Units into the skin daily., Disp: 15 mL, Rfl: 6   Objective:     There were no vitals filed for this visit.    There is no height or weight on file to calculate BMI.    Physical Exam:    ***   Electronically signed by:  Benito Mccreedy D.[facility] 12:24 PM 12/11/22

## 2022-12-12 ENCOUNTER — Ambulatory Visit (INDEPENDENT_AMBULATORY_CARE_PROVIDER_SITE_OTHER): Payer: Medicare Other | Admitting: Sports Medicine

## 2022-12-12 ENCOUNTER — Ambulatory Visit (INDEPENDENT_AMBULATORY_CARE_PROVIDER_SITE_OTHER): Payer: Medicare Other

## 2022-12-12 VITALS — BP 120/68 | HR 66 | Ht 67.0 in | Wt 196.0 lb

## 2022-12-12 DIAGNOSIS — M7551 Bursitis of right shoulder: Secondary | ICD-10-CM | POA: Diagnosis not present

## 2022-12-12 DIAGNOSIS — M25511 Pain in right shoulder: Secondary | ICD-10-CM | POA: Diagnosis not present

## 2022-12-12 DIAGNOSIS — M7552 Bursitis of left shoulder: Secondary | ICD-10-CM

## 2022-12-12 DIAGNOSIS — M25512 Pain in left shoulder: Secondary | ICD-10-CM | POA: Diagnosis not present

## 2022-12-12 DIAGNOSIS — G8929 Other chronic pain: Secondary | ICD-10-CM

## 2022-12-12 NOTE — Patient Instructions (Signed)
PT referral  3-4 week follow up

## 2022-12-17 ENCOUNTER — Ambulatory Visit: Payer: Medicare Other | Attending: Cardiovascular Disease | Admitting: Cardiovascular Disease

## 2022-12-17 ENCOUNTER — Encounter: Payer: Self-pay | Admitting: Cardiovascular Disease

## 2022-12-17 ENCOUNTER — Telehealth: Payer: Self-pay

## 2022-12-17 VITALS — BP 108/56 | HR 69 | Ht 67.0 in | Wt 193.6 lb

## 2022-12-17 DIAGNOSIS — Z951 Presence of aortocoronary bypass graft: Secondary | ICD-10-CM

## 2022-12-17 DIAGNOSIS — E782 Mixed hyperlipidemia: Secondary | ICD-10-CM

## 2022-12-17 NOTE — Telephone Encounter (Signed)
I spoke with [employer] rep and she states that write on cover page that patient has [place] and not commercial insurance and to send a copy of the card.

## 2022-12-17 NOTE — Progress Notes (Signed)
12/17/2022 Fred [name]   [birth year]  [id number]  Primary Physician [name], MD Primary Cardiologist: Runell Gess MD Nicholes [place], [place]  HPI:  Fred [name] is a 82 y.o.  mildly overweight weight married Caucasian male father of 2, grandfather of 4 grandchildren who is referred by the ER and Dr. Tiburcio Pea for evaluation of dizziness.  He retired in 2000 from working at [employer] in [place].  I last saw him in the office 11/07/2021. His cardiac risk factors include treated diabetes and hyperlipidemia.  He does not smoke.  He does have a family history of heart disease with a mother that had CABG and a younger brother who died as well which was found in the evaluation of syncope.  He is never had a heart attack or stroke.  He denies chest pain or shortness of breath.  He is fairly active and routinely walked 3 miles a day up until COVID-19 when he became more sedentary however recently he is try to increase his activity.  On 08/30/2019 he went out for his morning run of 3 miles and at the end he felt weak.  When driving back home he thought that he may have passed out but he was dizzy.  He went to the ER where he was nauseated as well.  ER evaluation was unrevealing.  He said he felt a little bit of chest discomfort this morning when coming to the office but otherwise specifically denied the symptoms.   I performed event monitoring which showed predominantly sinus rhythm with occasional PACs, PVCs and short runs of PSVT. A coronary CTA did show three-vessel disease.  Based on this, I recommended that we proceed with outpatient diagnostic coronary angiography which was done on 10/11/2019 revealing left main/three-vessel disease.  The following day he underwent CABG x4 by Dr. Maren Beach with a LIMA to his LAD, vein to ramus branch, obtuse marginal branch and to the PDA.  He was discharged home on 10/20/2019 and has been doing well since.   Since I saw him in  in the office a year ago he is done well.  He is really asymptomatic.  He denies chest pain or shortness of breath.  He was walking 3 miles a day prior to his surgery and he has yet to get back to that level unfortunately.  Current Meds  Medication Sig   Accu-Chek FastClix Lancets MISC USE 1 TO 4 TIMES DAILY AS  NEEDED/DIRECTED   ACCU-CHEK GUIDE test strip CHECK BLOOD SUGAR DAILY AS  DIRECTED   aspirin 81 MG tablet Take 1 tablet (81 mg total) by mouth daily.   Blood Glucose Monitoring Suppl (ACCU-CHEK AVIVA) device by Other route. Use as instructed 1 time daily   clotrimazole (LOTRIMIN) 1 % cream Apply 1 application  topically as needed (Groin).   dapagliflozin propanediol (FARXIGA) 10 MG TABS tablet Take 1 tablet (10 mg total) by mouth daily before breakfast.   Dulaglutide (TRULICITY) 3 MG/0.5ML SOPN Inject 3 mg as directed once a week.   Insulin Pen Needle 32G X 4 MM MISC 1 Device by Does not apply route daily.   levothyroxine (SYNTHROID) 25 MCG tablet Take 1 tablet (25 mcg total) by mouth daily before breakfast.   metFORMIN (GLUCOPHAGE) 1000 MG tablet Take 1 tablet (1,000 mg total) by mouth 2 (two) times daily with a meal.   metoprolol tartrate (LOPRESSOR) 25 MG tablet TAKE ONE-HALF TABLET BY  MOUTH TWICE DAILY   Multiple  Vitamins-Minerals (MULTIVITAMIN WITH MINERALS) tablet Take 1 tablet by mouth daily.   nitroGLYCERIN (NITROSTAT) 0.4 MG SL tablet Place 1 tablet (0.4 mg total) under the tongue every 5 (five) minutes as needed for chest pain.   simvastatin (ZOCOR) 40 MG tablet Take 40 mg by mouth daily at 6 PM.    solifenacin (VESICARE) 5 MG tablet Take 5 mg by mouth daily.    tamsulosin (FLOMAX) 0.4 MG CAPS capsule tamsulosin 0.4 mg capsule  TK 1 C PO QD   TOUJEO MAX SOLOSTAR 300 UNIT/ML Solostar Pen Inject 42 Units into the skin daily.     No Known Allergies  Social History   Socioeconomic History   Marital status: Widowed    Spouse name: Not on file   Number of children: Not on  file   Years of education: 12   Highest education level: Some college, no degree  Occupational History   Occupation: Retired  Tobacco Use   Smoking status: Former    Years: 10.00    Types: Cigarettes    Quit date: 12/1962    Years since quitting: 60.0   Smokeless tobacco: Never  Substance and Sexual Activity   Alcohol use: No   Drug use: No   Sexual activity: Not on file  Other Topics Concern   Not on file  Social History Narrative   Not on file   Social Determinants of Health   Financial Resource Strain: Not on file  Food Insecurity: Not on file  Transportation Needs: Not on file  Physical Activity: Not on file  Stress: Not on file  Social Connections: Not on file  Intimate Partner Violence: Not on file     Review of Systems: General: negative for chills, fever, night sweats or weight changes.  Cardiovascular: negative for chest pain, dyspnea on exertion, edema, orthopnea, palpitations, paroxysmal nocturnal dyspnea or shortness of breath Dermatological: negative for rash Respiratory: negative for cough or wheezing Urologic: negative for hematuria Abdominal: negative for nausea, vomiting, diarrhea, bright red blood per rectum, melena, or hematemesis Neurologic: negative for visual changes, syncope, or dizziness All other systems reviewed and are otherwise negative except as noted above.    Blood pressure (!) 108/56, pulse 69, height 5\' 7"  (1.702 m), weight 193 lb 9.6 oz (87.8 kg), SpO2 96 %.  General appearance: alert and no distress Neck: no adenopathy, no carotid bruit, no JVD, supple, symmetrical, trachea midline, and thyroid not enlarged, symmetric, no tenderness/mass/nodules Lungs: clear to auscultation bilaterally Heart: regular rate and rhythm, S1, S2 normal, no murmur, click, rub or gallop Extremities: extremities normal, atraumatic, no cyanosis or edema Pulses: 2+ and symmetric Skin: Skin color, texture, turgor normal. No rashes or lesions Neurologic:  Grossly normal  EKG sinus rhythm at 69 without ST or T wave changes.  Personally reviewed this EKG.  ASSESSMENT AND PLAN:   Hyperlipidemia History of hyperlipidemia on statin therapy with lipid profile performed 12/04/2022 revealing total cholesterol 151, LDL 67 and HDL 32.  S/P CABG x 4 History of CAD status post coronary artery bypass grafting by Dr. Dahlia Byes 10/12/2019 after cardiac catheterization performed by myself the day before showed left main/three-vessel disease.  He has done well since.  He denies chest pain or shortness of breath.     Lorretta Harp MD FACP,FACC,FAHA, [facility] 12/17/2022 9:44 AM

## 2022-12-17 NOTE — Assessment & Plan Note (Signed)
History of hyperlipidemia on statin therapy with lipid profile performed 12/04/2022 revealing total cholesterol 151, LDL 67 and HDL 32.

## 2022-12-17 NOTE — Patient Instructions (Addendum)
Medication Instructions:  Your physician recommends that you continue on your current medications as directed. Please refer to the Current Medication list given to you today.  *If you need a refill on your cardiac medications before your next appointment, please call your pharmacy*   Follow-Up: At [facility], you and your health needs are our priority.  As part of our continuing mission to provide you with exceptional heart care, we have created designated Provider Care Teams.  These Care Teams include your primary Cardiologist (physician) and Advanced Practice Providers (APPs -  Physician Assistants and Nurse Practitioners) who all work together to provide you with the care you need, when you need it.  We recommend signing up for the patient portal called "MyChart".  Sign up information is provided on this After Visit Summary.  MyChart is used to connect with patients for Virtual Visits (Telemedicine).  Patients are able to view lab/test results, encounter notes, upcoming appointments, etc.  Non-urgent messages can be sent to your provider as well.   To learn more about what you can do with MyChart, go to [email or web address].    Your next appointment:   12 month(s)  The format for your next appointment:   In Person  Provider:   Jonathan Berry, MD   

## 2022-12-17 NOTE — Telephone Encounter (Signed)
Patient is requesting a letter stating that he only has the one insurance to send to [employer].  Patient called and sent his insurance card but they would not accept that.

## 2022-12-17 NOTE — Assessment & Plan Note (Signed)
History of CAD status post coronary artery bypass grafting by Dr. Dahlia Byes 10/12/2019 after cardiac catheterization performed by myself the day before showed left main/three-vessel disease.  He has done well since.  He denies chest pain or shortness of breath.

## 2022-12-24 NOTE — Therapy (Signed)
OUTPATIENT PHYSICAL THERAPY EVALUATION   Patient Name: Fred [name] MRN: [id number] DOB:19-Oct-1941, 82 y.o., male Today's Date: 12/25/2022  END OF SESSION:  PT End of Session - 12/25/22 0809     Visit Number 1    Number of Visits 9    Date for PT Re-Evaluation 02/19/23    Authorization Type UHC MCR    Progress Note Due on Visit 10    PT Start Time 0800    PT Stop Time 0845    PT Time Calculation (min) 45 min    Activity Tolerance Patient tolerated treatment well    Behavior During Therapy WFL for tasks assessed/performed             Past Medical History:  Diagnosis Date   Coronary artery disease    Diabetes mellitus without complication ([place])    High cholesterol    Vertigo    Past Surgical History:  Procedure Laterality Date   CARDIAC CATHETERIZATION     CORONARY ARTERY BYPASS GRAFT N/A 10/12/2019   Procedure: CORONARY ARTERY BYPASS GRAFTING (CABG) x 4 (LIMA to LAD, SVG to OM, SVG to RAMUS INTERMEDIATE, SVG to PDA) with EVH from [place] and LEFT INTERNAL MAMMARY ARTERY HARVEST;  Surgeon: Ivin Poot, MD;  Location: [facility];  Service: Open Heart Surgery;  Laterality: N/A;   FOOT SURGERY     KNEE SURGERY     LEFT HEART CATH AND CORONARY ANGIOGRAPHY N/A 10/11/2019   Procedure: LEFT HEART CATH AND CORONARY ANGIOGRAPHY;  Surgeon: Lorretta Harp, MD;  Location: [facility];  Service: Cardiovascular;  Laterality: N/A;   NASAL SEPTUM SURGERY     TEE WITHOUT CARDIOVERSION N/A 10/12/2019   Procedure: TRANSESOPHAGEAL ECHOCARDIOGRAM (TEE);  Surgeon: Prescott Gum, Collier Salina, MD;  Location: [facility];  Service: Open Heart Surgery;  Laterality: N/A;   Patient Active Problem List   Diagnosis Date Noted   Epigastric hernia 08/23/2020   Chronic chest wall pain 06/21/2020   Neuropathic pain of chest 06/21/2020   Postop check 06/07/2020   Diabetes mellitus ([place]) 03/21/2020   Type 2 diabetes mellitus with hyperglycemia, with long-term current use of insulin  ([place]) 03/21/2020   Acquired hypothyroidism 03/21/2020   Type 2 diabetes mellitus with diabetic polyneuropathy, with long-term current use of insulin ([place]) 03/21/2020   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   CAD in native artery 10/11/2019   Coronary artery disease 10/05/2019   Hyperlipidemia 09/07/2019   Dizziness 09/07/2019   Family history of heart disease 09/07/2019   Sudden hearing loss, right 10/26/2018    PCP: Shirline Frees, MD  REFERRING PROVIDER: Glennon Mac, DO  REFERRING DIAG: Chronic pain of both shoulders; Subacromial bursitis of both shoulders  THERAPY DIAG:  Chronic right shoulder pain  Chronic left shoulder pain  Muscle weakness (generalized)  Rationale for Evaluation and Treatment: Rehabilitation  ONSET DATE: ongoing for months   SUBJECTIVE:        SUBJECTIVE STATEMENT: Patient reports he is having some pain in his shoulders, especially the right shoulder. He feels like the motion is limited as well. This has been ongoing for months without any mechanism of injury. He reports that lifting his arms up or reaching behind his back will aggravate the shoulders the most. He did have a shot in his right shoulder the other day, didn't help much and his shoulder is still painful. He does have trouble sleeping on his side because of shoulder pain. Patient denies any clicking or popping of  the shoulders.  Patient is right handed.  PERTINENT HISTORY: See PMH above  PAIN:  Are you having pain? Yes:  NPRS scale: 0/10 (reports 10/10 pain when moving the shoulder) Pain location: Bilateral shoulder (right worse than left) Pain description: Intermittent, intense, sharp Aggravating factors: Shoulder movement, overhead, behind back Relieving factors: Rest  PRECAUTIONS: None  WEIGHT BEARING RESTRICTIONS: No  FALLS:  Has patient fallen in last 6 months? 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  PATIENT SURVEYS:  FOTO 50% functional status  COGNITION: Overall cognitive status: Within functional limits for tasks assessed     SENSATION: WFL  POSTURE: Rounded shoulder and forward head posture  UPPER EXTREMITY ROM:   Active ROM Right eval Left eval  Shoulder flexion 110 140  Shoulder extension    Shoulder abduction    Shoulder adduction    Shoulder internal rotation Reach to PSIS Reach L1  Shoulder external rotation Reach to C5 Reach T2  (Blank rows = not tested)  UPPER EXTREMITY MMT:  MMT Right eval Left eval  Shoulder flexion 4- 4  Shoulder extension 4 4  Shoulder abduction 4- 4  Shoulder adduction    Shoulder internal rotation 5 5  Shoulder external rotation 4 4  Middle trapezius    Lower trapezius    Elbow flexion 5 5  Elbow extension 5 5  Wrist flexion    Wrist extension    Wrist ulnar deviation    Wrist radial deviation    Wrist pronation    Wrist supination    Grip strength (lbs)    (Blank rows = not tested)  JOINT MOBILITY TESTING:  Gross hypomobility bilateral GHJ  PALPATION:  Tender at bilateral AC joint, periacromial region, infraspinatus    TODAY'S TREATMENT:      [facility] Adult PT Treatment:                                                DATE: 12/25/2022 Therapeutic Exercise: Supine dowel shoulder flexion x 10 Seated table slide shoulder flexion x 10 Row with green x 15 ER with green x 10  PATIENT EDUCATION: Education details: Exam findings, POC, HEP Person educated: Patient Education method: Explanation, Demonstration, Tactile cues, Verbal cues, and Handouts Education comprehension: verbalized understanding, returned demonstration, verbal cues required, tactile cues required, and needs further education  HOME EXERCISE PROGRAM: Access Code: [email or web address]     ASSESSMENT: CLINICAL IMPRESSION: Patient is a 82 y.o. male who was seen today for physical therapy evaluation and treatment for chronic bilateral shoulder pain. Patient's symptoms likely combination of rotator cuff related pain and arthritic related pain of the GHJ and ACJ. He demonstrates limitations in shoulder A/PROM and strength with pain at end range.   OBJECTIVE IMPAIRMENTS: decreased activity tolerance, decreased ROM, decreased strength, postural dysfunction, and pain.   ACTIVITY LIMITATIONS: carrying, lifting, dressing, reach over head, and hygiene/grooming  PARTICIPATION LIMITATIONS: meal prep, cleaning, shopping, community activity, and yard work  PERSONAL FACTORS: Age, Fitness, Past/current experiences, Time since onset of injury/illness/exacerbation, and 3+ comorbidities: see PMH above  are also affecting patient's functional outcome.   REHAB POTENTIAL: Good  CLINICAL DECISION MAKING: Stable/uncomplicated  EVALUATION COMPLEXITY: Low  GOALS: Goals reviewed with patient? Yes  SHORT TERM GOALS: Target date: 01/22/2023  Patient will be I with initial HEP in order to progress with therapy. Baseline: Goal status: INITIAL  2.  PT will review FOTO with patient by 3rd visit in order to understand expected progress and outcome with therapy. Baseline:  Goal status: INITIAL  3.  Patient will report pain with shoulder movement/activity </= 6/10 in order to reduce functional limitations Baseline: patient reports 10/10 pain Goal status: INITIAL  LONG TERM GOALS: Target date: 02/19/2023  Patient will be I with final HEP to maintain progress from PT. Baseline:  Goal status: INITIAL  2.  Patient will report >/= 63% status on FOTO to indicate improved functional ability. Baseline: 50% Goal status: INITIAL  3.  Patient will demonstrate right shoulder flexion >/= 130 deg to improve reaching into overhead cabinet Baseline: 110 deg Goal status: INITIAL  4.  Patient will  exhibit shoulder IR reach to L1 to improve dressing ability Baseline: PSIS Goal status: INITIAL  5. Patient will demonstrate bilateral shoulder strength >/= 4+/5 MMT to improve performing household tasks with less pain  Baseline: strength deficits noted above  Goal status: INITIAL   PLAN: PT FREQUENCY: 1-2x/week  PT DURATION: 8 weeks  PLANNED INTERVENTIONS: Therapeutic exercises, Therapeutic activity, Neuromuscular re-education, Balance training, Gait training, Patient/Family education, Self Care, Joint mobilization, Joint manipulation, Aquatic Therapy, Dry Needling, Spinal manipulation, Spinal mobilization, Cryotherapy, Moist heat, Taping, Ionotophoresis 4mg /ml Dexamethasone, Manual therapy, and Re-evaluation  PLAN FOR NEXT SESSION: Review HEP and progress PRN, manual/stretching for shoulder motion, progress AAROM>AROM as tolerated, progress rotator cuff strengthening as able   Hilda Blades, PT, DPT, LAT, ATC 12/25/22  10:44 AM Phone: [phone] Fax: [phone]

## 2022-12-25 ENCOUNTER — Encounter: Payer: Self-pay | Admitting: Physical Therapy

## 2022-12-25 ENCOUNTER — Other Ambulatory Visit: Payer: Self-pay

## 2022-12-25 ENCOUNTER — Ambulatory Visit: Payer: Medicare Other | Attending: Sports Medicine | Admitting: Physical Therapy

## 2022-12-25 DIAGNOSIS — M7551 Bursitis of right shoulder: Secondary | ICD-10-CM | POA: Diagnosis not present

## 2022-12-25 DIAGNOSIS — G8929 Other chronic pain: Secondary | ICD-10-CM | POA: Diagnosis present

## 2022-12-25 DIAGNOSIS — M25512 Pain in left shoulder: Secondary | ICD-10-CM | POA: Diagnosis present

## 2022-12-25 DIAGNOSIS — M6281 Muscle weakness (generalized): Secondary | ICD-10-CM | POA: Insufficient documentation

## 2022-12-25 DIAGNOSIS — M7552 Bursitis of left shoulder: Secondary | ICD-10-CM | POA: Diagnosis not present

## 2022-12-25 DIAGNOSIS — M25511 Pain in right shoulder: Secondary | ICD-10-CM | POA: Diagnosis not present

## 2022-12-25 NOTE — Patient Instructions (Signed)
Access Code: [email or web address] URL: [email or web address] Date: 12/25/2022 Prepared by: Hilda Blades  Exercises - Supine Shoulder Flexion Extension AAROM with Dowel  - 2 x daily - 20 reps - Seated Shoulder Flexion Towel Slide at Table Top  - 2 x daily - 10 reps - 5 seconds hold - Standing Row with Anchored Resistance  - 1 x daily - 2 sets - 10 reps - Shoulder External Rotation with Anchored Resistance  - 1 x daily - 2 sets - 10 reps

## 2023-01-01 ENCOUNTER — Ambulatory Visit: Payer: Medicare Other | Admitting: Physical Therapy

## 2023-01-01 DIAGNOSIS — G8929 Other chronic pain: Secondary | ICD-10-CM

## 2023-01-01 DIAGNOSIS — M25511 Pain in right shoulder: Secondary | ICD-10-CM | POA: Diagnosis not present

## 2023-01-01 DIAGNOSIS — M6281 Muscle weakness (generalized): Secondary | ICD-10-CM

## 2023-01-01 NOTE — Therapy (Signed)
OUTPATIENT PHYSICAL THERAPY TREATMENT NOTE   Patient Name: Fred [name] MRN: [id number] DOB:1941-02-20, 82 y.o., male Today's Date: 01/01/2023  PCP: [name], MD   REFERRING PROVIDER: Richardean Sale, DO  END OF SESSION:   PT End of Session - 01/01/23 0928     Visit Number 2    Number of Visits 9    Date for PT Re-Evaluation 02/19/23    Authorization Type UHC MCR    Progress Note Due on Visit 10    PT Start Time 0930    PT Stop Time 1018    PT Time Calculation (min) 48 min             Past Medical History:  Diagnosis Date   Coronary artery disease    Diabetes mellitus without complication ([facility])    High cholesterol    Vertigo    Past Surgical History:  Procedure Laterality Date   CARDIAC CATHETERIZATION     CORONARY ARTERY BYPASS GRAFT N/A 10/12/2019   Procedure: CORONARY ARTERY BYPASS GRAFTING (CABG) x 4 (LIMA to LAD, SVG to OM, SVG to RAMUS INTERMEDIATE, SVG to PDA) with EVH from RIGHT GREATER SAPHENOUS VEIN and LEFT INTERNAL MAMMARY ARTERY HARVEST;  Surgeon: Kerin Perna, MD;  [facility]: [facility];  Service: Open Heart Surgery;  Laterality: N/A;   FOOT SURGERY     KNEE SURGERY     LEFT HEART CATH AND CORONARY ANGIOGRAPHY N/A 10/11/2019   Procedure: LEFT HEART CATH AND CORONARY ANGIOGRAPHY;  Surgeon: Runell Gess, MD;  Location: [facility];  Service: Cardiovascular;  Laterality: N/A;   NASAL SEPTUM SURGERY     TEE WITHOUT CARDIOVERSION N/A 10/12/2019   Procedure: TRANSESOPHAGEAL ECHOCARDIOGRAM (TEE);  Surgeon: [name], Theron Arista, MD;  Location: [facility];  Service: Open Heart Surgery;  Laterality: N/A;   Patient Active Problem List   Diagnosis Date Noted   Epigastric hernia 08/23/2020   Chronic chest wall pain 06/21/2020   Neuropathic pain of chest 06/21/2020   Postop check 06/07/2020   Diabetes mellitus ([facility]) 03/21/2020   Type 2 diabetes mellitus with hyperglycemia, with long-term current use of insulin ([facility]) 03/21/2020   Acquired  hypothyroidism 03/21/2020   Type 2 diabetes mellitus with diabetic polyneuropathy, with long-term current use of insulin ([facility]) 03/21/2020   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   CAD in native artery 10/11/2019   Coronary artery disease 10/05/2019   Hyperlipidemia 09/07/2019   Dizziness 09/07/2019   Family history of heart disease 09/07/2019   Sudden hearing loss, right 10/26/2018    REFERRING DIAG: Chronic pain of both shoulders; Subacromial bursitis of both shoulders  THERAPY DIAG:  Chronic right shoulder pain  Chronic left shoulder pain  Muscle weakness (generalized)  Rationale for Evaluation and Treatment Rehabilitation  PERTINENT HISTORY: See PMH above  PRECAUTIONS: None  SUBJECTIVE:  SUBJECTIVE STATEMENT:  I did the exercises a couple of times. 10/10 pain when I just took my jacket off behind my back. I have an appt with a sports medicine doctor tomorrow for follow up. The injection did not help much.    PAIN:  Are you having pain? 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   PATIENT SURVEYS:  FOTO 50% functional status   COGNITION: Overall cognitive status: Within functional limits for tasks assessed                                  SENSATION: WFL   POSTURE: Rounded shoulder and forward head posture    UPPER EXTREMITY ROM:    Active ROM Right eval Left eval  Shoulder flexion 110 140  Shoulder extension      Shoulder abduction      Shoulder adduction      Shoulder internal rotation Reach to PSIS Reach L1  Shoulder external rotation Reach to C5 Reach T2  (Blank rows = not tested)   UPPER EXTREMITY MMT:   MMT Right eval Left eval  Shoulder flexion 4- 4  Shoulder extension 4 4  Shoulder abduction 4- 4  Shoulder adduction      Shoulder internal rotation 5 5  Shoulder external rotation 4 4  Middle trapezius      Lower trapezius      Elbow flexion 5 5  Elbow extension 5 5  Wrist flexion      Wrist extension      Wrist ulnar deviation      Wrist radial deviation      Wrist pronation      Wrist supination      Grip strength (lbs)      (Blank rows = not tested)   JOINT MOBILITY TESTING:  Gross hypomobility bilateral GHJ   PALPATION:  Tender at bilateral AC joint, periacromial region, infraspinatus               TODAY'S TREATMENT:      [facility] Adult PT Treatment:                                                DATE: 01/01/23 Therapeutic Exercise: Pulleys flexion and scaption Standing row green x 15 Standing bilat ER x 15 Seated table slides flexion x 15 bilat Supine pullovers with dowel x 15 Supine AAROM Shoulder ER  Manual Therapy: Passive ROM right shoulder flexion, abduction, ER, IR to tolerance , all with end range pain.   Modalities: Trial of HMP for pain management to right shoulder x 10 minutes.    [facility] Adult PT Treatment:                                                DATE: 12/25/2022  Therapeutic Exercise: Supine dowel shoulder flexion x 10 Seated table slide shoulder flexion x 10 Row with green x 15 ER with green x 10   PATIENT EDUCATION: Education details: Exam findings, POC, HEP Person educated: Patient Education method: Explanation, Demonstration, Tactile cues, Verbal cues, and Handouts Education comprehension: verbalized understanding, returned  demonstration, verbal cues required, tactile cues required, and needs further education   HOME EXERCISE PROGRAM: Access Code: [email or web address] URL: [email or web address] Date: 01/01/2023 Prepared by: Hessie Diener  Exercises - Supine Shoulder Flexion Extension AAROM with Dowel  - 2 x daily - 20 reps - Seated Shoulder Flexion Towel Slide at Table Top  - 2 x daily - 10 reps - 5 seconds hold - Standing Row with Anchored Resistance  - 1 x daily - 2 sets - 10 reps - Shoulder External Rotation with Anchored Resistance  - 1 x daily - 2 sets - 10 reps     ASSESSMENT: CLINICAL IMPRESSION: Patient is a 82 y.o. male who was seen today for physical therapy  treatment for chronic bilateral shoulder pain. He reports min compliance with HEP. Reviewed HEP and progressed AAROM. He has pain and end range right shoulder motions. He had difficulty removing his coat due to shoulder pain.  No changes to HEP today. Trial of HMP for pain management strategies. No pain at end of session. Encouraged daily HEP to allow for maximal outcomes and ability to progress weekly. Pt verbalized understanding.       OBJECTIVE IMPAIRMENTS: decreased activity tolerance, decreased ROM, decreased strength, postural dysfunction, and pain.    ACTIVITY LIMITATIONS: carrying, lifting, dressing, reach over head, and hygiene/grooming   PARTICIPATION LIMITATIONS: meal prep, cleaning, shopping, community activity, and yard work   PERSONAL FACTORS: Age, Fitness, Past/current experiences, Time since onset of injury/illness/exacerbation, and 3+ comorbidities: see PMH above  are also affecting patient's functional outcome.    REHAB POTENTIAL: Good   CLINICAL DECISION MAKING: Stable/uncomplicated   EVALUATION COMPLEXITY: Low     GOALS: Goals reviewed with patient? Yes   SHORT TERM GOALS: Target date: 01/22/2023   Patient will be I with initial HEP in order to progress with therapy. Baseline: Goal status: INITIAL   2.  PT  will review FOTO with patient by 3rd visit in order to understand expected progress and outcome with therapy. Baseline:  Goal status: INITIAL   3.  Patient will report pain with shoulder movement/activity </= 6/10 in order to reduce functional limitations Baseline: patient reports 10/10 pain Goal status: INITIAL   LONG TERM GOALS: Target date: 02/19/2023   Patient will be I with final HEP to maintain progress from PT. Baseline:  Goal status: INITIAL   2.  Patient will report >/= 63% status on FOTO to indicate improved functional ability. Baseline: 50% Goal status: INITIAL   3.  Patient will demonstrate right shoulder flexion >/= 130 deg to improve reaching into overhead cabinet Baseline: 110 deg Goal status: INITIAL   4.  Patient will exhibit shoulder IR reach to L1 to improve dressing ability Baseline: PSIS Goal status: INITIAL   5. Patient will demonstrate bilateral shoulder strength >/= 4+/5 MMT to improve performing household tasks with less pain            Baseline: strength deficits noted above            Goal status: INITIAL     PLAN: PT FREQUENCY: 1-2x/week   PT DURATION: 8 weeks   PLANNED INTERVENTIONS: Therapeutic exercises, Therapeutic activity, Neuromuscular re-education, Balance training, Gait  training, Patient/Family education, Self Care, Joint mobilization, Joint manipulation, Aquatic Therapy, Dry Needling, Spinal manipulation, Spinal mobilization, Cryotherapy, Moist heat, Taping, Ionotophoresis 4mg /ml Dexamethasone, Manual therapy, and Re-evaluation   PLAN FOR NEXT SESSION: Review HEP and progress PRN, manual/stretching for shoulder motion, progress AAROM>AROM as tolerated, progress rotator cuff strengthening as able   , PTA 01/01/23 10:30 AM Phone: [phone] Fax: [phone]

## 2023-01-01 NOTE — Progress Notes (Unsigned)
Fred [name] D.[place] [name] Phone: [phone]   Assessment and Plan:     There are no diagnoses linked to this encounter.  ***   Pertinent previous records reviewed include ***   Follow Up: ***     Subjective:   I, Fred [name], am serving as a Education administrator for Doctor Fred [name]   Chief Complaint: bilateral shoulder pain    HPI:    12/12/2022 Patient is a 82 year old male complaining of bilateral shoulder pain. Patient states  bilateral should pains for months, worse on the right, no injury , takes tylenol 2 x 650 mg Bid without relief, no radiating pain ,  decreased ROM due to pain    01/02/2023 Patient states   Relevant Historical Information: History of CABG, DM type II with insulin use, acquired hypothyroidism  Additional pertinent review of systems negative.   Current Outpatient Medications:    Accu-Chek FastClix Lancets MISC, USE 1 TO 4 TIMES DAILY AS  NEEDED/DIRECTED, Disp: 408 each, Rfl: 3   ACCU-CHEK GUIDE test strip, CHECK BLOOD SUGAR DAILY AS  DIRECTED, Disp: 100 strip, Rfl: 3   aspirin 81 MG tablet, Take 1 tablet (81 mg total) by mouth daily., Disp: , Rfl:    Blood Glucose Monitoring Suppl (ACCU-CHEK AVIVA) device, by Other route. Use as instructed 1 time daily, Disp: , Rfl:    clotrimazole (LOTRIMIN) 1 % cream, Apply 1 application  topically as needed (Groin)., Disp: , Rfl:    dapagliflozin propanediol (FARXIGA) 10 MG TABS tablet, Take 1 tablet (10 mg total) by mouth daily before breakfast., Disp: 90 tablet, Rfl: 3   Dulaglutide (TRULICITY) 3 HY/8.5OY SOPN, Inject 3 mg as directed once a week., Disp: 6 mL, Rfl: 3   Insulin Pen Needle 32G X 4 MM MISC, 1 Device by Does not apply route daily., Disp: 100 each, Rfl: 3   levothyroxine (SYNTHROID) 25 MCG tablet, Take 1 tablet (25 mcg total) by mouth daily before breakfast., Disp: 90 tablet, Rfl: 3   metFORMIN (GLUCOPHAGE) 1000 MG tablet,  Take 1 tablet (1,000 mg total) by mouth 2 (two) times daily with a meal., Disp: 180 tablet, Rfl: 3   metoprolol tartrate (LOPRESSOR) 25 MG tablet, TAKE ONE-HALF TABLET BY  MOUTH TWICE DAILY, Disp: 60 tablet, Rfl: 5   Multiple Vitamins-Minerals (MULTIVITAMIN WITH MINERALS) tablet, Take 1 tablet by mouth daily., Disp: , Rfl:    nitroGLYCERIN (NITROSTAT) 0.4 MG SL tablet, Place 1 tablet (0.4 mg total) under the tongue every 5 (five) minutes as needed for chest pain., Disp: 25 tablet, Rfl: 3   simvastatin (ZOCOR) 40 MG tablet, Take 40 mg by mouth daily at 6 PM. , Disp: , Rfl:    solifenacin (VESICARE) 5 MG tablet, Take 5 mg by mouth daily. , Disp: , Rfl:    tamsulosin (FLOMAX) 0.4 MG CAPS capsule, tamsulosin 0.4 mg capsule  TK 1 C PO QD, Disp: , Rfl:    TOUJEO MAX SOLOSTAR 300 UNIT/ML Solostar Pen, Inject 42 Units into the skin daily., Disp: 15 mL, Rfl: 6   Objective:     There were no vitals filed for this visit.    There is no height or weight on file to calculate BMI.    Physical Exam:    ***   Electronically signed by:  Fred [name] D.[name] 11:48 AM 01/01/23

## 2023-01-02 ENCOUNTER — Ambulatory Visit: Payer: Self-pay

## 2023-01-02 ENCOUNTER — Ambulatory Visit (INDEPENDENT_AMBULATORY_CARE_PROVIDER_SITE_OTHER): Payer: Medicare Other | Admitting: Sports Medicine

## 2023-01-02 VITALS — HR 65 | Ht 67.0 in | Wt 193.0 lb

## 2023-01-02 DIAGNOSIS — M7552 Bursitis of left shoulder: Secondary | ICD-10-CM

## 2023-01-02 DIAGNOSIS — M7551 Bursitis of right shoulder: Secondary | ICD-10-CM

## 2023-01-02 DIAGNOSIS — G8929 Other chronic pain: Secondary | ICD-10-CM | POA: Diagnosis not present

## 2023-01-02 DIAGNOSIS — M25512 Pain in left shoulder: Secondary | ICD-10-CM

## 2023-01-02 DIAGNOSIS — M25511 Pain in right shoulder: Secondary | ICD-10-CM

## 2023-01-02 DIAGNOSIS — M19011 Primary osteoarthritis, right shoulder: Secondary | ICD-10-CM

## 2023-01-02 NOTE — Patient Instructions (Addendum)
Good to see you  Increase the amount you are doing your HEP  Continue PT  Continue tylenol  4 week follow up

## 2023-01-08 ENCOUNTER — Other Ambulatory Visit: Payer: Self-pay

## 2023-01-08 ENCOUNTER — Encounter: Payer: Self-pay | Admitting: Physical Therapy

## 2023-01-08 ENCOUNTER — Ambulatory Visit: Payer: Medicare Other | Admitting: Physical Therapy

## 2023-01-08 DIAGNOSIS — M25511 Pain in right shoulder: Secondary | ICD-10-CM | POA: Diagnosis not present

## 2023-01-08 DIAGNOSIS — M6281 Muscle weakness (generalized): Secondary | ICD-10-CM

## 2023-01-08 DIAGNOSIS — G8929 Other chronic pain: Secondary | ICD-10-CM

## 2023-01-08 NOTE — Patient Instructions (Signed)
Access Code: [email or web address] URL: [email or web address] Date: 01/08/2023 Prepared by: Hilda Blades  Exercises - Supine Shoulder Flexion Extension AAROM with Dowel  - 2 x daily - 20 reps - Seated Shoulder Flexion Towel Slide at Table Top  - 2 x daily - 10 reps - 5 seconds hold - Standing Row with Anchored Resistance  - 1 x daily - 2 sets - 10 reps - Shoulder External Rotation with Anchored Resistance  - 1 x daily - 2 sets - 10 reps - Standing shoulder flexion wall slides  - 2 x daily - 10 reps - 5 seconds hold

## 2023-01-08 NOTE — Therapy (Signed)
OUTPATIENT PHYSICAL THERAPY TREATMENT NOTE   Patient Name: Fred [name] MRN: [id number] DOB:11/30/1941, 82 y.o., male Today's Date: 01/08/2023  PCP: [name], MD   REFERRING PROVIDER: Richardean Sale, DO  END OF SESSION:   PT End of Session - 01/08/23 0851     Visit Number 3    Number of Visits 9    Date for PT Re-Evaluation 02/19/23    Authorization Type [employer] MCR    Progress Note Due on Visit 10    PT Start Time 0845    PT Stop Time 0927    PT Time Calculation (min) 42 min    Activity Tolerance Patient tolerated treatment well    Behavior During Therapy WFL for tasks assessed/performed              Past Medical History:  Diagnosis Date   Coronary artery disease    Diabetes mellitus without complication (HCC)    High cholesterol    Vertigo    Past Surgical History:  Procedure Laterality Date   CARDIAC CATHETERIZATION     CORONARY ARTERY BYPASS GRAFT N/A 10/12/2019   Procedure: CORONARY ARTERY BYPASS GRAFTING (CABG) x 4 (LIMA to LAD, SVG to OM, SVG to RAMUS INTERMEDIATE, SVG to PDA) with EVH from RIGHT GREATER SAPHENOUS VEIN and LEFT INTERNAL MAMMARY ARTERY HARVEST;  Surgeon: Kerin Perna, MD;  Location: [facility] OR;  Service: Open Heart Surgery;  Laterality: N/A;   FOOT SURGERY     KNEE SURGERY     LEFT HEART CATH AND CORONARY ANGIOGRAPHY N/A 10/11/2019   Procedure: LEFT HEART CATH AND CORONARY ANGIOGRAPHY;  Surgeon: Runell Gess, MD;  Location: [facility];  Service: Cardiovascular;  Laterality: N/A;   NASAL SEPTUM SURGERY     TEE WITHOUT CARDIOVERSION N/A 10/12/2019   Procedure: TRANSESOPHAGEAL ECHOCARDIOGRAM (TEE);  Surgeon: [name], Theron Arista, MD;  Location: [facility] OR;  Service: Open Heart Surgery;  Laterality: N/A;   Patient Active Problem List   Diagnosis Date Noted   Epigastric hernia 08/23/2020   Chronic chest wall pain 06/21/2020   Neuropathic pain of chest 06/21/2020   Postop check 06/07/2020   Diabetes mellitus (HCC) 03/21/2020   Type  2 diabetes mellitus with hyperglycemia, with long-term current use of insulin (HCC) 03/21/2020   Acquired hypothyroidism 03/21/2020   Type 2 diabetes mellitus with diabetic polyneuropathy, with long-term current use of insulin (HCC) 03/21/2020   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   CAD in native artery 10/11/2019   Coronary artery disease 10/05/2019   Hyperlipidemia 09/07/2019   Dizziness 09/07/2019   Family history of heart disease 09/07/2019   Sudden hearing loss, right 10/26/2018    REFERRING DIAG: Chronic pain of both shoulders; Subacromial bursitis of both shoulders  THERAPY DIAG:  Chronic right shoulder pain  Chronic left shoulder pain  Muscle weakness (generalized)  Rationale for Evaluation and Treatment Rehabilitation  PERTINENT HISTORY: See PMH above  PRECAUTIONS: None   SUBJECTIVE:  SUBJECTIVE STATEMENT:  Patient states he is noticing a little less pain in his right shoulder when taking his jacket off. He had to work on some smoke detectors the other day and it went surprisingly well.  PAIN:  Are you having pain? Yes:  NPRS scale: 0/10 (reports 8/10 pain when moving the shoulder) Pain location: Bilateral shoulder (right worse than left) Pain description: Intermittent, intense, sharp Aggravating factors: Shoulder movement, overhead, behind back Relieving factors: Rest   OBJECTIVE: (objective measures completed at initial evaluation unless otherwise dated) PATIENT SURVEYS:  FOTO 50% functional status   POSTURE: Rounded shoulder and forward head posture   UPPER EXTREMITY ROM:    Active ROM Right eval Left eval Rt / Lt 01/08/2023  Shoulder flexion 110 140 120 / 140  Shoulder extension       Shoulder abduction       Shoulder adduction       Shoulder internal rotation Reach to PSIS Reach L1    Shoulder external rotation Reach to C5 Reach T2   (Blank rows = not tested)   UPPER EXTREMITY MMT:   MMT Right eval Left eval  Shoulder flexion 4- 4  Shoulder extension 4 4  Shoulder abduction 4- 4  Shoulder adduction      Shoulder internal rotation 5 5  Shoulder external rotation 4 4  Middle trapezius      Lower trapezius      Elbow flexion 5 5  Elbow extension 5 5  Wrist flexion      Wrist extension      Wrist ulnar deviation      Wrist radial deviation      Wrist pronation      Wrist supination      Grip strength (lbs)      (Blank rows = not tested)   JOINT MOBILITY TESTING:  Gross hypomobility bilateral GHJ   PALPATION:  Tender at bilateral AC joint, periacromial region, infraspinatus               TODAY'S TREATMENT:      [facility] Adult PT Treatment:                                                DATE: 01/08/23 Therapeutic Exercise: UBE L1 x 4 min (fwd/bwd) while taking subjective Overhead pulleys shoulder flexion x 2 min Supine shoulder dowel flexion with 2# 2 x 10 Supine horizontal abduction with yellow 2 x 10 Sidelying abduction with 1# 2 x 15 with right Sidelying ER with 1# 2 x 15 with right Row with green 2 x 15 Extension with green 2 x 15 Standing wall slide shoulder flexion 2 x 10 Standing scaption with 1# 2 x 10   [facility] Adult PT Treatment:                                                DATE: 01/01/23 Therapeutic Exercise: Pulleys flexion and scaption Standing row green x 15 Standing bilat ER x 15 Seated table slides flexion x 15 bilat Supine pullovers with dowel x 15 Supine AAROM Shoulder ER Manual Therapy: Passive ROM right shoulder flexion, abduction, ER, IR to tolerance , all with end range pain.  Modalities: Trial of HMP for  pain management to right shoulder x 10 minutes.   [place] Adult PT Treatment:                                                DATE: 12/25/2022 Therapeutic Exercise: Supine dowel shoulder flexion x 10 Seated table slide  shoulder flexion x 10 Row with green x 15 ER with green x 10   PATIENT EDUCATION: Education details: HEP Person educated: Patient Education method: Consulting civil engineer, Demonstration, Corporate treasurer cues, Verbal cues Education comprehension: verbalized understanding, returned demonstration, verbal cues required, tactile cues required, and needs further education   HOME EXERCISE PROGRAM: Access Code: [email or web address]     ASSESSMENT: CLINICAL IMPRESSION: Patient tolerated therapy well with no adverse effects. Therapy focused on progressing shoulder mobility and strengthening with good tolerance. Cueing provided throughout session for proper exercise technique and shoulder control. He does continue to report pain at end ranges of motion but does demonstrate improved shoulder motion this visit. He does continue to report majority of pain when reaching behind back and remains limited. Updated his HEP to progress overhead stretching. Patient would benefit from continued skilled PT to progress his mobility and strength in order to reduce pain and maximize functional ability.      OBJECTIVE IMPAIRMENTS: decreased activity tolerance, decreased ROM, decreased strength, postural dysfunction, and pain.    ACTIVITY LIMITATIONS: carrying, lifting, dressing, reach over head, and hygiene/grooming   PARTICIPATION LIMITATIONS: meal prep, cleaning, shopping, community activity, and yard work   PERSONAL FACTORS: Age, Fitness, Past/current experiences, Time since onset of injury/illness/exacerbation, and 3+ comorbidities: see PMH above  are also affecting patient's functional outcome.      GOALS: Goals reviewed with patient? Yes   SHORT TERM GOALS: Target date: 01/22/2023   Patient will be I with initial HEP in order to progress with therapy. Baseline: Goal status: INITIAL   2.  PT will review FOTO with patient by 3rd visit in order to understand expected progress and outcome with therapy. Baseline:  01/08/2023:  reviewed Goal status: MET   3.  Patient will report pain with shoulder movement/activity </= 6/10 in order to reduce functional limitations Baseline: patient reports 10/10 pain Goal status: INITIAL   LONG TERM GOALS: Target date: 02/19/2023   Patient will be I with final HEP to maintain progress from PT. Baseline:  Goal status: INITIAL   2.  Patient will report >/= 63% status on FOTO to indicate improved functional ability. Baseline: 50% Goal status: INITIAL   3.  Patient will demonstrate right shoulder flexion >/= 130 deg to improve reaching into overhead cabinet Baseline: 110 deg Goal status: INITIAL   4.  Patient will exhibit shoulder IR reach to L1 to improve dressing ability Baseline: PSIS Goal status: INITIAL   5. Patient will demonstrate bilateral shoulder strength >/= 4+/5 MMT to improve performing household tasks with less pain            Baseline: strength deficits noted above            Goal status: INITIAL     PLAN: PT FREQUENCY: 1-2x/week   PT DURATION: 8 weeks   PLANNED INTERVENTIONS: Therapeutic exercises, Therapeutic activity, Neuromuscular re-education, Balance training, Gait training, Patient/Family education, Self Care, Joint mobilization, Joint manipulation, Aquatic Therapy, Dry Needling, Spinal manipulation, Spinal mobilization, Cryotherapy, Moist heat, Taping, Ionotophoresis 4mg /ml Dexamethasone, Manual therapy, and Re-evaluation   PLAN  FOR NEXT SESSION: Review HEP and progress PRN, manual/stretching for shoulder motion, progress AAROM>AROM as tolerated, work on reaching behind back, progress rotator cuff strengthening as able   Hilda Blades, PT, DPT, LAT, ATC 01/08/23  9:39 AM Phone: [phone] Fax: [phone]

## 2023-01-12 ENCOUNTER — Other Ambulatory Visit: Payer: Self-pay | Admitting: Cardiovascular Disease

## 2023-01-15 ENCOUNTER — Encounter: Payer: Self-pay | Admitting: Physical Therapy

## 2023-01-15 ENCOUNTER — Ambulatory Visit: Payer: Medicare Other | Attending: Sports Medicine | Admitting: Physical Therapy

## 2023-01-15 DIAGNOSIS — G8929 Other chronic pain: Secondary | ICD-10-CM | POA: Diagnosis present

## 2023-01-15 DIAGNOSIS — M25512 Pain in left shoulder: Secondary | ICD-10-CM | POA: Insufficient documentation

## 2023-01-15 DIAGNOSIS — M6281 Muscle weakness (generalized): Secondary | ICD-10-CM | POA: Diagnosis present

## 2023-01-15 DIAGNOSIS — M25511 Pain in right shoulder: Secondary | ICD-10-CM | POA: Insufficient documentation

## 2023-01-15 NOTE — Therapy (Signed)
OUTPATIENT PHYSICAL THERAPY TREATMENT NOTE   Patient Name: Fred [name] MRN: [id number] DOB:[birth year], 82 y.o., male Today's Date: 01/15/2023  PCP: [name], MD   REFERRING PROVIDER: Richardean Sale, DO  END OF SESSION:   PT End of Session - 01/15/23 0932     Visit Number 4    Number of Visits 9    Date for PT Re-Evaluation 02/19/23    Authorization Type [employer] MCR    Progress Note Due on Visit 10    PT Start Time 0930    PT Stop Time 1015    PT Time Calculation (min) 45 min              Past Medical History:  Diagnosis Date   Coronary artery disease    Diabetes mellitus without complication (HCC)    High cholesterol    Vertigo    Past Surgical History:  Procedure Laterality Date   CARDIAC CATHETERIZATION     CORONARY ARTERY BYPASS GRAFT N/A 10/12/2019   Procedure: CORONARY ARTERY BYPASS GRAFTING (CABG) x 4 (LIMA to LAD, SVG to OM, SVG to RAMUS INTERMEDIATE, SVG to PDA) with EVH from RIGHT GREATER SAPHENOUS VEIN and LEFT INTERNAL MAMMARY ARTERY HARVEST;  Surgeon: Kerin Perna, MD;  Location: [facility] OR;  Service: Open Heart Surgery;  Laterality: N/A;   FOOT SURGERY     KNEE SURGERY     LEFT HEART CATH AND CORONARY ANGIOGRAPHY N/A 10/11/2019   Procedure: LEFT HEART CATH AND CORONARY ANGIOGRAPHY;  Surgeon: Runell Gess, MD;  Location: [facility];  Service: Cardiovascular;  Laterality: N/A;   NASAL SEPTUM SURGERY     TEE WITHOUT CARDIOVERSION N/A 10/12/2019   Procedure: TRANSESOPHAGEAL ECHOCARDIOGRAM (TEE);  Surgeon: [name], Theron Arista, MD;  Location: [facility] OR;  Service: Open Heart Surgery;  Laterality: N/A;   Patient Active Problem List   Diagnosis Date Noted   Epigastric hernia 08/23/2020   Chronic chest wall pain 06/21/2020   Neuropathic pain of chest 06/21/2020   Postop check 06/07/2020   Diabetes mellitus (HCC) 03/21/2020   Type 2 diabetes mellitus with hyperglycemia, with long-term current use of insulin (HCC) 03/21/2020   Acquired  hypothyroidism 03/21/2020   Type 2 diabetes mellitus with diabetic polyneuropathy, with long-term current use of insulin (HCC) 03/21/2020   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   CAD in native artery 10/11/2019   Coronary artery disease 10/05/2019   Hyperlipidemia 09/07/2019   Dizziness 09/07/2019   Family history of heart disease 09/07/2019   Sudden hearing loss, right 10/26/2018    REFERRING DIAG: Chronic pain of both shoulders; Subacromial bursitis of both shoulders  THERAPY DIAG:  Chronic right shoulder pain  Chronic left shoulder pain  Muscle weakness (generalized)  Rationale for Evaluation and Treatment Rehabilitation  PERTINENT HISTORY: See PMH above  PRECAUTIONS: None   SUBJECTIVE:  SUBJECTIVE STATEMENT:  PI was able to get the job done standing on a ladder and reaching over head. Pain can still be 8-10/10 if I push it.   PAIN:  Are you having pain? Yes:  NPRS scale: 0/10 (reports 8/10 pain when moving the shoulder) Pain location: Bilateral shoulder (right worse than left) Pain description: Intermittent, intense, sharp Aggravating factors: Shoulder movement, overhead, behind back Relieving factors: Rest   OBJECTIVE: (objective measures completed at initial evaluation unless otherwise dated) PATIENT SURVEYS:  FOTO 50% functional status   POSTURE: Rounded shoulder and forward head posture   UPPER EXTREMITY ROM:    Active ROM Right eval Left eval Rt / Lt 01/08/2023 RT/LT 01/15/23  Shoulder flexion 110 140 120 / 140 130/145  Shoulder extension        Shoulder abduction        Shoulder adduction        Shoulder internal rotation Reach to PSIS Reach L1  RT-Reach to PSIS   Shoulder external rotation Reach to C5 Reach T2  Reach T2 bilat  (Blank rows = not tested)   UPPER EXTREMITY MMT:   MMT  Right eval Left eval  Shoulder flexion 4- 4  Shoulder extension 4 4  Shoulder abduction 4- 4  Shoulder adduction      Shoulder internal rotation 5 5  Shoulder external rotation 4 4  Middle trapezius      Lower trapezius      Elbow flexion 5 5  Elbow extension 5 5  Wrist flexion      Wrist extension      Wrist ulnar deviation      Wrist radial deviation      Wrist pronation      Wrist supination      Grip strength (lbs)      (Blank rows = not tested)   JOINT MOBILITY TESTING:  Gross hypomobility bilateral GHJ   PALPATION:  Tender at bilateral AC joint, periacromial region, infraspinatus               TODAY'S TREATMENT:      [facility] Adult PT Treatment:                                                DATE: 01/15/23 Therapeutic Exercise: UBE L1.5 x 4 min  Pulleys Scaption slides 10 x  2 each Lower trap setting 10 x 2 each- wall slides  IR AAROM with dowel Assisted IR stretch behind back 10 sec x 2 Seated horizontal abduction red band 10 x 2    [facility] Adult PT Treatment:                                                DATE: 01/08/23 Therapeutic Exercise: UBE L1 x 4 min (fwd/bwd) while taking subjective Overhead pulleys shoulder flexion x 2 min Supine shoulder dowel flexion with 2# 2 x 10 Supine horizontal abduction with yellow 2 x 10 Sidelying abduction with 1# 2 x 15 with right Sidelying ER with 1# 2 x 15 with right Row with green 2 x 15 Extension with green 2 x 15 Standing wall slide shoulder flexion 2 x 10 Standing scaption with 1# 2 x 10   [facility] Adult  PT Treatment:                                                DATE: 01/01/23 Therapeutic Exercise: Pulleys flexion and scaption Standing row green x 15 Standing bilat ER x 15 Seated table slides flexion x 15 bilat Supine pullovers with dowel x 15 Supine AAROM Shoulder ER Manual Therapy: Passive ROM right shoulder flexion, abduction, ER, IR to tolerance , all with end range pain.  Modalities: Trial of HMP for pain  management to right shoulder x 10 minutes.   [facility] Adult PT Treatment:                                                DATE: 12/25/2022 Therapeutic Exercise: Supine dowel shoulder flexion x 10 Seated table slide shoulder flexion x 10 Row with green x 15 ER with green x 10   PATIENT EDUCATION: Education details: HEP Person educated: Patient Education method: Consulting civil engineer, Demonstration, Tactile cues, Verbal cues Education comprehension: verbalized understanding, returned demonstration, verbal cues required, tactile cues required, and needs further education   HOME EXERCISE PROGRAM: Access Code: [email or web address] Added 01/15/23 - Standing Bilateral Shoulder Internal Rotation AAROM with Dowel  - 1 x daily - 7 x weekly - 2 sets - 10 reps - 5 hold - Standing Shoulder Internal Rotation Stretch Behind Back  - 1 x daily - 7 x weekly - 1 sets - 5 reps - 10 hold - Standing Shoulder Horizontal Abduction with Resistance  - 1 x daily - 7 x weekly - 2 sets - 10 reps Added 01/15/23  - Standing Bilateral Shoulder Internal Rotation AAROM with Dowel  - 1 x daily - 7 x weekly - 2 sets - 10 reps - 5 hold - Standing Shoulder Internal Rotation Stretch Behind Back  - 1 x daily - 7 x weekly - 1 sets - 5 reps - 10 hold - Standing Shoulder Horizontal Abduction with Resistance  - 1 x daily - 7 x weekly - 2 sets - 10 reps    ASSESSMENT: CLINICAL IMPRESSION: Patient tolerated therapy well with no adverse effects. Therapy focused on progressing shoulder mobility and strengthening with good tolerance. IR reach still limited to PSIS on right UE however improved to lower lumbar after session. His bilateral shoulder Flexion AROM continues to improve as well. He reports increased pain with OH working but was able to complete over head work recently with moderate difficulty.   Patient would benefit from continued skilled PT to progress his mobility and strength in order to reduce pain and maximize functional ability.      OBJECTIVE  IMPAIRMENTS: decreased activity tolerance, decreased ROM, decreased strength, postural dysfunction, and pain.    ACTIVITY LIMITATIONS: carrying, lifting, dressing, reach over head, and hygiene/grooming   PARTICIPATION LIMITATIONS: meal prep, cleaning, shopping, community activity, and yard work   PERSONAL FACTORS: Age, Fitness, Past/current experiences, Time since onset of injury/illness/exacerbation, and 3+ comorbidities: see PMH above  are also affecting patient's functional outcome.      GOALS: Goals reviewed with patient? Yes   SHORT TERM GOALS: Target date: 01/22/2023   Patient will be I with initial HEP in order to progress with therapy. Baseline: Goal status: ONGOING   2.  PT will review FOTO with patient by 3rd visit in order to understand expected progress and outcome with therapy. Baseline:  01/08/2023: reviewed Goal status: MET   3.  Patient will report pain with shoulder movement/activity </= 6/10 in order to reduce functional limitations Baseline: patient reports 10/10 pain Goal status: ONGOING   LONG TERM GOALS: Target date: 02/19/2023   Patient will be I with final HEP to maintain progress from PT. Baseline:  Goal status: INITIAL   2.  Patient will report >/= 63% status on FOTO to indicate improved functional ability. Baseline: 50% Goal status: INITIAL   3.  Patient will demonstrate right shoulder flexion >/= 130 deg to improve reaching into overhead cabinet Baseline: 110 deg Goal status: INITIAL   4.  Patient will exhibit shoulder IR reach to L1 to improve dressing ability Baseline: PSIS Goal status: INITIAL   5. Patient will demonstrate bilateral shoulder strength >/= 4+/5 MMT to improve performing household tasks with less pain            Baseline: strength deficits noted above            Goal status: INITIAL     PLAN: PT FREQUENCY: 1-2x/week   PT DURATION: 8 weeks   PLANNED INTERVENTIONS: Therapeutic exercises, Therapeutic activity, Neuromuscular  re-education, Balance training, Gait training, Patient/Family education, Self Care, Joint mobilization, Joint manipulation, Aquatic Therapy, Dry Needling, Spinal manipulation, Spinal mobilization, Cryotherapy, Moist heat, Taping, Ionotophoresis 4mg /ml Dexamethasone, Manual therapy, and Re-evaluation   PLAN FOR NEXT SESSION: Review HEP and progress PRN, manual/stretching for shoulder motion, progress AAROM>AROM as tolerated, work on reaching behind back, progress rotator cuff strengthening as able   Hessie Diener, PTA 01/15/23 1:39 PM Phone: [phone] Fax: [phone]

## 2023-01-22 ENCOUNTER — Emergency Department (HOSPITAL_COMMUNITY)
Admission: EM | Admit: 2023-01-22 | Discharge: 2023-01-22 | Disposition: A | Discharge status: Home / Self Care | Payer: Medicare Other | Attending: Emergency Medicine | Admitting: Emergency Medicine

## 2023-01-22 ENCOUNTER — Ambulatory Visit: Payer: Medicare Other | Admitting: Physical Therapy

## 2023-01-22 ENCOUNTER — Encounter (HOSPITAL_COMMUNITY): Payer: Self-pay

## 2023-01-22 ENCOUNTER — Other Ambulatory Visit: Payer: Self-pay

## 2023-01-22 ENCOUNTER — Emergency Department (HOSPITAL_COMMUNITY): Payer: Medicare Other

## 2023-01-22 ENCOUNTER — Encounter: Payer: Self-pay | Admitting: Physical Therapy

## 2023-01-22 DIAGNOSIS — Z794 Long term (current) use of insulin: Secondary | ICD-10-CM | POA: Insufficient documentation

## 2023-01-22 DIAGNOSIS — S61012A Laceration without foreign body of left thumb without damage to nail, initial encounter: Secondary | ICD-10-CM | POA: Insufficient documentation

## 2023-01-22 DIAGNOSIS — Z7982 Long term (current) use of aspirin: Secondary | ICD-10-CM | POA: Diagnosis not present

## 2023-01-22 DIAGNOSIS — Z23 Encounter for immunization: Secondary | ICD-10-CM | POA: Diagnosis not present

## 2023-01-22 DIAGNOSIS — W312XXA Contact with powered woodworking and forming machines, initial encounter: Secondary | ICD-10-CM | POA: Diagnosis not present

## 2023-01-22 DIAGNOSIS — G8929 Other chronic pain: Secondary | ICD-10-CM

## 2023-01-22 DIAGNOSIS — E119 Type 2 diabetes mellitus without complications: Secondary | ICD-10-CM | POA: Diagnosis not present

## 2023-01-22 DIAGNOSIS — M25511 Pain in right shoulder: Secondary | ICD-10-CM | POA: Diagnosis not present

## 2023-01-22 DIAGNOSIS — Z7984 Long term (current) use of oral hypoglycemic drugs: Secondary | ICD-10-CM | POA: Diagnosis not present

## 2023-01-22 DIAGNOSIS — S61412A Laceration without foreign body of left hand, initial encounter: Secondary | ICD-10-CM

## 2023-01-22 DIAGNOSIS — M6281 Muscle weakness (generalized): Secondary | ICD-10-CM

## 2023-01-22 MED ORDER — ACETAMINOPHEN 325 MG PO TABS
650.0000 mg | ORAL_TABLET | Freq: Once | ORAL | Status: AC
  Administered 2023-01-22: 650 mg via ORAL
  Filled 2023-01-22: qty 2

## 2023-01-22 MED ORDER — TETANUS-DIPHTH-ACELL PERTUSSIS 5-2.5-18.5 LF-MCG/0.5 IM SUSY
0.5000 mL | PREFILLED_SYRINGE | Freq: Once | INTRAMUSCULAR | Status: AC
  Administered 2023-01-22: 0.5 mL via INTRAMUSCULAR
  Filled 2023-01-22: qty 0.5

## 2023-01-22 MED ORDER — LIDOCAINE HCL (PF) 1 % IJ SOLN
30.0000 mL | Freq: Once | INTRAMUSCULAR | Status: AC
  Administered 2023-01-22: 30 mL
  Filled 2023-01-22: qty 30

## 2023-01-22 MED ORDER — CEPHALEXIN 500 MG PO CAPS: 500.0000 mg | ORAL_CAPSULE | Freq: Four times a day (QID) | ORAL | 0 refills | Status: AC

## 2023-01-22 NOTE — Discharge Instructions (Signed)
You had 6 stitches placed to your left thumb, these will need to be removed within 7 to 10 days.  You are also placed on a short course of antibiotics, please take 1 tablet 4 times a day for the next 7 days.  Please follow-up with your primary care physician as needed.

## 2023-01-22 NOTE — Therapy (Signed)
OUTPATIENT PHYSICAL THERAPY TREATMENT NOTE   Patient Name: Fred [name] MRN: [id number] DOB:1941/10/20, 82 y.o., male Today's Date: 01/22/2023  PCP: Shirline Frees, MD   REFERRING PROVIDER: Glennon Mac, DO   END OF SESSION:   PT End of Session - 01/22/23 0935     Visit Number 5    Number of Visits 9    Date for PT Re-Evaluation 02/19/23    Authorization Type [employer] MCR    Progress Note Due on Visit 10    PT Start Time 0930    PT Stop Time 1010    PT Time Calculation (min) 40 min    Activity Tolerance Patient tolerated treatment well    Behavior During Therapy WFL for tasks assessed/performed               Past Medical History:  Diagnosis Date   Coronary artery disease    Diabetes mellitus without complication ([place])    High cholesterol    Vertigo    Past Surgical History:  Procedure Laterality Date   CARDIAC CATHETERIZATION     CORONARY ARTERY BYPASS GRAFT N/A 10/12/2019   Procedure: CORONARY ARTERY BYPASS GRAFTING (CABG) x 4 (LIMA to LAD, SVG to OM, SVG to RAMUS INTERMEDIATE, SVG to PDA) with EVH from [place] and LEFT INTERNAL MAMMARY ARTERY HARVEST;  Surgeon: Ivin Poot, MD;  Location: [facility];  Service: Open Heart Surgery;  Laterality: N/A;   FOOT SURGERY     KNEE SURGERY     LEFT HEART CATH AND CORONARY ANGIOGRAPHY N/A 10/11/2019   Procedure: LEFT HEART CATH AND CORONARY ANGIOGRAPHY;  Surgeon: Lorretta Harp, MD;  Location: [facility];  Service: Cardiovascular;  Laterality: N/A;   NASAL SEPTUM SURGERY     TEE WITHOUT CARDIOVERSION N/A 10/12/2019   Procedure: TRANSESOPHAGEAL ECHOCARDIOGRAM (TEE);  Surgeon: Prescott Gum, Collier Salina, MD;  Location: [facility];  Service: Open Heart Surgery;  Laterality: N/A;   Patient Active Problem List   Diagnosis Date Noted   Epigastric hernia 08/23/2020   Chronic chest wall pain 06/21/2020   Neuropathic pain of chest 06/21/2020   Postop check 06/07/2020   Diabetes mellitus ([place]) 03/21/2020    Type 2 diabetes mellitus with hyperglycemia, with long-term current use of insulin ([place]) 03/21/2020   Acquired hypothyroidism 03/21/2020   Type 2 diabetes mellitus with diabetic polyneuropathy, with long-term current use of insulin ([place]) 03/21/2020   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   CAD in native artery 10/11/2019   Coronary artery disease 10/05/2019   Hyperlipidemia 09/07/2019   Dizziness 09/07/2019   Family history of heart disease 09/07/2019   Sudden hearing loss, right 10/26/2018    REFERRING DIAG: Chronic pain of both shoulders; Subacromial bursitis of both shoulders  THERAPY DIAG:  Chronic right shoulder pain  Chronic left shoulder pain  Muscle weakness (generalized)  Rationale for Evaluation and Treatment Rehabilitation  PERTINENT HISTORY: See PMH above  PRECAUTIONS: None   SUBJECTIVE:  SUBJECTIVE STATEMENT:  Patient reports he thinks the pain is getting a little less.   PAIN:  Are you having pain? Yes:  NPRS scale: 0/10 (reports 7/10 pain when moving the shoulder) Pain location: Bilateral shoulder (right worse than left) Pain description: Intermittent, intense, sharp Aggravating factors: Shoulder movement, overhead, behind back Relieving factors: Rest   OBJECTIVE: (objective measures completed at initial evaluation unless otherwise dated) PATIENT SURVEYS:  FOTO 50% functional status   POSTURE: Rounded shoulder and forward head posture   UPPER EXTREMITY ROM:    Active ROM Right eval Left eval Rt / Lt 01/08/2023 RT/LT 01/15/23  Shoulder flexion 110 140 120 / 140 130/145  Shoulder extension        Shoulder abduction        Shoulder adduction        Shoulder internal rotation Reach to PSIS Reach L1  RT-Reach to PSIS   Shoulder external rotation Reach to C5 Reach T2  Reach T2 bilat   (Blank rows = not tested)   UPPER EXTREMITY MMT:   MMT Right eval Left eval Rt / Lt 01/22/2023  Shoulder flexion 4- 4 4 / 4+  Shoulder extension 4 4   Shoulder abduction 4- 4 4 / 4+  Shoulder adduction       Shoulder internal rotation 5 5   Shoulder external rotation 4 4 4 $ / 4  Middle trapezius       Lower trapezius       Elbow flexion 5 5   Elbow extension 5 5   Wrist flexion       Wrist extension       Wrist ulnar deviation       Wrist radial deviation       Wrist pronation       Wrist supination       Grip strength (lbs)       (Blank rows = not tested)   JOINT MOBILITY TESTING:  Gross hypomobility bilateral GHJ   PALPATION:  Tender at bilateral AC joint, periacromial region, infraspinatus               TODAY'S TREATMENT:      [facility] Adult PT Treatment:                                                DATE: 01/22/23 Therapeutic Exercise: UBE L1 x 4 min (fwd/bwd) while taking subjective Overhead pulleys shoulder flexion x 2 min Supine shoulder dowel flexion with 4# 2 x 10 Standing wall slide shoulder flexion 2 x 15 Supine horizontal abduction with red 2 x 15 Row with back 2 x 15 Extension with green 2 x 15 ER with red 2 x 15 Standing scaption with 2# 2 x 15 Extension with dowel x 10 IR dowel slide up back x 10   [facility] Adult PT Treatment:                                                DATE: 01/15/23 Therapeutic Exercise: UBE L1.5 x 4 min  Pulleys Scaption slides 10 x  2 each Lower trap setting 10 x 2 each- wall slides  IR AAROM with dowel Assisted IR stretch behind back 10 sec x 2  Seated horizontal abduction red band 10 x 2   [facility] Adult PT Treatment:                                                DATE: 01/08/23 Therapeutic Exercise: UBE L1 x 4 min (fwd/bwd) while taking subjective Overhead pulleys shoulder flexion x 2 min Supine shoulder dowel flexion with 2# 2 x 10 Supine horizontal abduction with yellow 2 x 10 Sidelying abduction with 1# 2 x 15 with  right Sidelying ER with 1# 2 x 15 with right Row with green 2 x 15 Extension with green 2 x 15 Standing wall slide shoulder flexion 2 x 10 Standing scaption with 1# 2 x 10   PATIENT EDUCATION: Education details: HEP Person educated: Patient Education method: Consulting civil engineer, Demonstration, Corporate treasurer cues, Verbal cues Education comprehension: verbalized understanding, returned demonstration, verbal cues required, tactile cues required, and needs further education   HOME EXERCISE PROGRAM: Access Code: [id number]    ASSESSMENT: CLINICAL IMPRESSION: Patient tolerated therapy well with no adverse effects. He demonstrates improved strength this visit. Therapy continues to focus on progressing shoulder mobility and strengthening with good tolerance. He does continue to report pain at end range elevation but otherwise did not report pain with strengthening exercises, though he does report muscle fatigue. He seems to be improving does report pain with using the shoulder overhead and limitations reaching behind his back. No changes to HEP this visit. Patient would benefit from continued skilled PT to progress his mobility and strength in order to reduce pain and maximize functional ability.      OBJECTIVE IMPAIRMENTS: decreased activity tolerance, decreased ROM, decreased strength, postural dysfunction, and pain.    ACTIVITY LIMITATIONS: carrying, lifting, dressing, reach over head, and hygiene/grooming   PARTICIPATION LIMITATIONS: meal prep, cleaning, shopping, community activity, and yard work   PERSONAL FACTORS: Age, Fitness, Past/current experiences, Time since onset of injury/illness/exacerbation, and 3+ comorbidities: see PMH above  are also affecting patient's functional outcome.      GOALS: Goals reviewed with patient? Yes   SHORT TERM GOALS: Target date: 01/22/2023   Patient will be I with initial HEP in order to progress with therapy. Baseline: 01/22/23: progressing Goal status:  ONGOING   2.  PT will review FOTO with patient by 3rd visit in order to understand expected progress and outcome with therapy. Baseline:  01/08/2023: reviewed Goal status: MET   3.  Patient will report pain with shoulder movement/activity </= 6/10 in order to reduce functional limitations Baseline: patient reports 10/10 pain 01/22/23: 7/10 pain Goal status: ONGOING   LONG TERM GOALS: Target date: 02/19/2023   Patient will be I with final HEP to maintain progress from PT. Baseline:  Goal status: INITIAL   2.  Patient will report >/= 63% status on FOTO to indicate improved functional ability. Baseline: 50% Goal status: INITIAL   3.  Patient will demonstrate right shoulder flexion >/= 130 deg to improve reaching into overhead cabinet Baseline: 110 deg Goal status: INITIAL   4.  Patient will exhibit shoulder IR reach to L1 to improve dressing ability Baseline: PSIS Goal status: INITIAL   5. Patient will demonstrate bilateral shoulder strength >/= 4+/5 MMT to improve performing household tasks with less pain            Baseline: strength deficits noted above  Goal status: INITIAL     PLAN: PT FREQUENCY: 1-2x/week   PT DURATION: 8 weeks   PLANNED INTERVENTIONS: Therapeutic exercises, Therapeutic activity, Neuromuscular re-education, Balance training, Gait training, Patient/Family education, Self Care, Joint mobilization, Joint manipulation, Aquatic Therapy, Dry Needling, Spinal manipulation, Spinal mobilization, Cryotherapy, Moist heat, Taping, Ionotophoresis 44m/ml Dexamethasone, Manual therapy, and Re-evaluation   PLAN FOR NEXT SESSION: Review HEP and progress PRN, manual/stretching for shoulder motion, progress AAROM>AROM as tolerated, work on reaching behind back, progress rotator cuff strengthening as able   JHessie Diener PTA 01/22/23 10:15 AM Phone: [phone]: [phone]

## 2023-01-22 NOTE — ED Notes (Signed)
ED Provider at bedside. 

## 2023-01-22 NOTE — ED Notes (Addendum)
Pt verbalized understanding of discharge instructions. Opportunity for questions provided.  Pt given additional dressing supplies.

## 2023-01-22 NOTE — ED Provider Notes (Signed)
[facility] Provider Note   CSN: [id number] Arrival date & time: 01/22/23  1509     History DM, CAD Chief Complaint  Patient presents with   Laceration    Fred [name] is a 82 y.o. male.  82 year old male with a PMH of diabetes, vertigo presents to the ED with a chief complaint of left thumb laceration x 1 hour ago.  Patient reports he was using his table saw when suddenly he missed the lock and ran his left thumb onto the table saw.  Large laceration to the left DIP.  Currently on no blood thinners, bleeding has stopped with pressure dressing.  Last tetanus immunization is unknown. Worsening with movement. No other injury reported.   The history is provided by the patient.  Laceration Associated symptoms: no fever        Home Medications Prior to Admission medications   Medication Sig Start Date End Date Taking? Authorizing Provider  cephALEXin (KEFLEX) 500 MG capsule Take 1 capsule (500 mg total) by mouth 4 (four) times daily for 7 days. 01/22/23 01/29/23 Yes Ali Mclaurin, Beverley Fiedler, PA-C  Accu-Chek FastClix Lancets MISC USE 1 TO 4 TIMES DAILY AS  NEEDED/DIRECTED 02/13/22   Shamleffer, Melanie Crazier, MD  ACCU-CHEK GUIDE test strip CHECK BLOOD SUGAR DAILY AS  DIRECTED 02/06/22   Shamleffer, Melanie Crazier, MD  aspirin 81 MG tablet Take 1 tablet (81 mg total) by mouth daily. 11/07/21   Lorretta Harp, MD  Blood Glucose Monitoring Suppl (ACCU-CHEK AVIVA) device by Other route. Use as instructed 1 time daily    [provider]  clotrimazole (LOTRIMIN) 1 % cream Apply 1 application  topically as needed (Groin).    [provider]  dapagliflozin propanediol (FARXIGA) 10 MG TABS tablet Take 1 tablet (10 mg total) by mouth daily before breakfast. 09/18/22   Shamleffer, Melanie Crazier, MD  Dulaglutide (TRULICITY) 3 0000000 SOPN Inject 3 mg as directed once a week. 12/04/22   Shamleffer, Melanie Crazier, MD  Insulin Pen Needle 32G  X 4 MM MISC 1 Device by Does not apply route daily. 06/03/22   Shamleffer, Melanie Crazier, MD  levothyroxine (SYNTHROID) 25 MCG tablet Take 1 tablet (25 mcg total) by mouth daily before breakfast. 12/05/22   Shamleffer, Melanie Crazier, MD  metFORMIN (GLUCOPHAGE) 1000 MG tablet Take 1 tablet (1,000 mg total) by mouth 2 (two) times daily with a meal. 06/03/22   Shamleffer, Melanie Crazier, MD  metoprolol tartrate (LOPRESSOR) 25 MG tablet TAKE ONE-HALF TABLET BY MOUTH  TWICE DAILY 01/14/23   Lorretta Harp, MD  Multiple Vitamins-Minerals (MULTIVITAMIN WITH MINERALS) tablet Take 1 tablet by mouth daily.    [provider]  nitroGLYCERIN (NITROSTAT) 0.4 MG SL tablet Place 1 tablet (0.4 mg total) under the tongue every 5 (five) minutes as needed for chest pain. 05/03/20   Almyra Deforest, PA  simvastatin (ZOCOR) 40 MG tablet Take 40 mg by mouth daily at 6 PM.     [provider]  solifenacin (VESICARE) 5 MG tablet Take 5 mg by mouth daily.     [provider]  tamsulosin (FLOMAX) 0.4 MG CAPS capsule tamsulosin 0.4 mg capsule  TK 1 C PO QD    [provider]  TOUJEO MAX SOLOSTAR 300 UNIT/ML Solostar Pen Inject 42 Units into the skin daily. 06/03/22   Shamleffer, Melanie Crazier, MD      Allergies    Patient has no known allergies.    Review of  Systems   Review of Systems  Constitutional:  Negative for fever.  Skin:  Positive for wound.  All other systems reviewed and are negative.   Physical Exam Updated Vital Signs BP 122/83   Pulse 92   Temp 98.3 F (36.8 C)   Resp 16   Ht 5' 7"$  (1.702 m)   Wt 82.6 kg   SpO2 95%   BMI 28.51 kg/m  Physical Exam Vitals and nursing note reviewed.  Constitutional:      Appearance: Normal appearance.  HENT:     Head: Normocephalic and atraumatic.     Mouth/Throat:     Mouth: Mucous membranes are moist.  Eyes:     Pupils: Pupils are equal, round, and reactive to light.  Cardiovascular:     Rate and Rhythm: Normal rate.      Pulses:          Radial pulses are 2+ on the left side.  Pulmonary:     Effort: Pulmonary effort is normal.  Abdominal:     General: Abdomen is flat.  Musculoskeletal:     Left hand: Laceration present.     Cervical back: Normal range of motion and neck supple.     Comments: Large laceration to the left thumb at the DIP.Please see photos attached. Pulses present, capillary refill is intact, sensation decreased at the DIP but good ROM.   Skin:    General: Skin is warm and dry.     Findings: Erythema present.  Neurological:     Mental Status: He is alert and oriented to person, place, and time.        ED Results / Procedures / Treatments   Labs (all labs ordered are listed, but only abnormal results are displayed) Labs Reviewed - No data to display  EKG None  Radiology DG Finger Thumb Left  Result Date: 01/22/2023 CLINICAL DATA:  Left thumb laceration. EXAM: LEFT THUMB 2+V COMPARISON:  None Available. FINDINGS: There is no evidence of fracture or dislocation. Moderate degenerative changes seen involving the first carpometacarpal joint. No radiopaque foreign body is noted. Soft tissues are unremarkable. IMPRESSION: No fracture or dislocation is noted.  No radiopaque foreign body. Electronically Signed   By: Marijo Conception M.D.   On: 01/22/2023 15:55    Procedures .Marland KitchenLaceration Repair  Date/Time: 01/22/2023 4:32 PM  Performed by: Janeece Fitting, PA-C Authorized by: Janeece Fitting, PA-C   Consent:    Consent obtained:  Verbal   Consent given by:  Patient   Risks discussed:  Infection and pain   Alternatives discussed:  No treatment Universal protocol:    Patient identity confirmed:  Verbally with patient Anesthesia:    Anesthesia method:  Local infiltration   Local anesthetic:  Lidocaine 1% w/o epi Laceration details:    Location:  Finger   Finger location:  L thumb   Length (cm):  5   Depth (mm):  1 Treatment:    Area cleansed with:  Saline   Amount of cleaning:   Extensive Skin repair:    Repair method:  Sutures   Suture size:  3-0   Suture technique:  Simple interrupted   Number of sutures:  6 Approximation:    Approximation:  Close Repair type:    Repair type:  Simple Post-procedure details:    Dressing:  Splint for protection   Procedure completion:  Tolerated well, no immediate complications     Medications Ordered in ED Medications  Tdap (BOOSTRIX) injection 0.5 mL (0.5 mLs  Intramuscular Given 01/22/23 1604)  acetaminophen (TYLENOL) tablet 650 mg (650 mg Oral Given 01/22/23 1603)  lidocaine (PF) (XYLOCAINE) 1 % injection 30 mL (30 mLs Infiltration Given 01/22/23 1604)    ED Course/ Medical Decision Making/ A&P                             Medical Decision Making Amount and/or Complexity of Data Reviewed Radiology: ordered.  Risk OTC drugs. Prescription drug management.   Patient presents to the ED status post left hand injury while using a table saw via EMS.  Last tetanus immunization is unknown.  During evaluation pulses are present, capillary refills intact.  There is about a 5 cm laceration to his left thumb at the DIP.  Good flexion and good extension.  X-ray without any foreign body, no blood thinners on board.  No fracture.  He is diabetic at baseline, we discussed antibiotic treatment to help prevent any infection.  I personally repaired his wound with 6 stitches to the left thumb, he was placed in a splint.  We discussed appropriate follow-up with hand specialist on-call.    Portions of this note were generated with [employer]. Dictation errors may occur despite best attempts at proofreading.   Final Clinical Impression(s) / ED Diagnoses Final diagnoses:  Laceration of left hand without foreign body, initial encounter    Rx / DC Orders ED Discharge Orders          Ordered    cephALEXin (KEFLEX) 500 MG capsule  4 times daily        01/22/23 1631              Janeece Fitting, PA-C 01/22/23  [place], [place], DO 01/22/23 1802

## 2023-01-22 NOTE — ED Notes (Signed)
Pt c/o laceration to anterior L thumb.  Pt sts he was cutting wood to make a wheelchair ramp and touched the saw.

## 2023-01-22 NOTE — ED Triage Notes (Signed)
Pt arrives via EMS from home. Pt was using a table saw and cut his left thumb. Bleeding currently controlled with dressing.

## 2023-01-29 ENCOUNTER — Encounter: Payer: Self-pay | Admitting: Physical Therapy

## 2023-01-29 ENCOUNTER — Ambulatory Visit: Payer: Medicare Other | Admitting: Physical Therapy

## 2023-01-29 DIAGNOSIS — M25511 Pain in right shoulder: Secondary | ICD-10-CM | POA: Diagnosis not present

## 2023-01-29 DIAGNOSIS — M6281 Muscle weakness (generalized): Secondary | ICD-10-CM

## 2023-01-29 DIAGNOSIS — G8929 Other chronic pain: Secondary | ICD-10-CM

## 2023-01-29 NOTE — Therapy (Signed)
OUTPATIENT PHYSICAL THERAPY TREATMENT NOTE   Patient Name: Fred [name] MRN: [id number] DOB:[birth year], 82 y.o., male Today's Date: 01/29/2023  PCP: Shirline Frees, MD   REFERRING PROVIDER: Glennon Mac, DO   END OF SESSION:   PT End of Session - 01/29/23 0852     Visit Number 6    Number of Visits 9    Date for PT Re-Evaluation 02/19/23    Authorization Type U[employer] MCR    Progress Note Due on Visit 10    PT Start Time [id number]    PT Stop Time 0928    PT Time Calculation (min) 41 min               Past Medical History:  Diagnosis Date   Coronary artery disease    Diabetes mellitus without complication ([place])    High cholesterol    Vertigo    Past Surgical History:  Procedure Laterality Date   CARDIAC CATHETERIZATION     CORONARY ARTERY BYPASS GRAFT N/A 10/12/2019   Procedure: CORONARY ARTERY BYPASS GRAFTING (CABG) x 4 (LIMA to LAD, SVG to OM, SVG to RAMUS INTERMEDIATE, SVG to PDA) with EVH from [place] and LEFT [place];  Surgeon: Ivin Poot, MD;  Location: [facility];  Service: Open Heart Surgery;  Laterality: N/A;   FOOT SURGERY     KNEE SURGERY     LEFT HEART CATH AND CORONARY ANGIOGRAPHY N/A 10/11/2019   Procedure: LEFT HEART CATH AND CORONARY ANGIOGRAPHY;  Surgeon: Lorretta Harp, MD;  Location: [facility];  Service: Cardiovascular;  Laterality: N/A;   NASAL SEPTUM SURGERY     TEE WITHOUT CARDIOVERSION N/A 10/12/2019   Procedure: TRANSESOPHAGEAL ECHOCARDIOGRAM (TEE);  Surgeon: Prescott Gum, Collier Salina, MD;  Location: [facility];  Service: Open Heart Surgery;  Laterality: N/A;   Patient Active Problem List   Diagnosis Date Noted   Epigastric hernia 08/23/2020   Chronic chest wall pain 06/21/2020   Neuropathic pain of chest 06/21/2020   Postop check 06/07/2020   Diabetes mellitus ([place]) 03/21/2020   Type 2 diabetes mellitus with hyperglycemia, with long-term current use of insulin ([place]) 03/21/2020   Acquired  hypothyroidism 03/21/2020   Type 2 diabetes mellitus with diabetic polyneuropathy, with long-term current use of insulin ([place]) 03/21/2020   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   CAD in native artery 10/11/2019   Coronary artery disease 10/05/2019   Hyperlipidemia 09/07/2019   Dizziness 09/07/2019   Family history of heart disease 09/07/2019   Sudden hearing loss, right 10/26/2018    REFERRING DIAG: Chronic pain of both shoulders; Subacromial bursitis of both shoulders  THERAPY DIAG:  Chronic right shoulder pain  Chronic left shoulder pain  Muscle weakness (generalized)  Rationale for Evaluation and Treatment Rehabilitation  PERTINENT HISTORY: See PMH above  PRECAUTIONS: None   SUBJECTIVE:  SUBJECTIVE STATEMENT:  Patient reports he thinks there might be some improvement. I still have times when pain reaches a 7/10.   PAIN:  Are you having pain? Yes:  NPRS scale: 0/10 (reports 7/10 pain when moving the shoulder) Pain location: Bilateral shoulder (right worse than left) Pain description: Intermittent, intense, sharp Aggravating factors: Shoulder movement, overhead, behind back Relieving factors: Rest   OBJECTIVE: (objective measures completed at initial evaluation unless otherwise dated) PATIENT SURVEYS:  FOTO 50% functional status, target 63% 01/29/23: FOTO status: 55%    POSTURE: Rounded shoulder and forward head posture   UPPER EXTREMITY ROM:    Active ROM Right eval Left eval Rt / Lt 01/08/2023 RT/LT 01/15/23 RT 01/29/23  Shoulder flexion 110 140 120 / 140 130/145 135  Shoulder extension         Shoulder abduction       135  Shoulder adduction         Shoulder internal rotation Reach to PSIS Reach L1  RT-Reach to PSIS  Reach to L5  Shoulder external rotation Reach to C5 Reach T2  Reach T2 bilat  Reach T2  (Blank rows = not tested)   UPPER EXTREMITY MMT:   MMT Right eval Left eval Rt / Lt 01/22/2023  Shoulder flexion 4- 4 4 / 4+  Shoulder extension 4 4   Shoulder abduction 4- 4 4 / 4+  Shoulder adduction       Shoulder internal rotation 5 5   Shoulder external rotation 4 4 4 $ / 4  Middle trapezius       Lower trapezius       Elbow flexion 5 5   Elbow extension 5 5   Wrist flexion       Wrist extension       Wrist ulnar deviation       Wrist radial deviation       Wrist pronation       Wrist supination       Grip strength (lbs)       (Blank rows = not tested)   JOINT MOBILITY TESTING:  Gross hypomobility bilateral GHJ   PALPATION:  Tender at bilateral AC joint, periacromial region, infraspinatus               TODAY'S TREATMENT:      [facility] Adult PT Treatment:                                                DATE: 01/29/23 Therapeutic Exercise: UBE L1 x 4 min (fwd/bwd) while taking subjective Overhead pulleys shoulder flexion x 2 min Supine shoulder dowel flexion with 5# 2 x 10 Standing wall slide shoulder flexion 1 x 15 Standing wall slide shoulder abduction 1 x 15 Supine horizontal abduction with red 2 x 15 Row with back 2 x 15 Extension with green 2 x 15 Bilat ER with green  2 x 15, seated Standing scaption with 2# 2 x 15 Extension with dowel x 10 IR dowel slide up back x 10   [facility] Adult PT Treatment:                                                DATE: 01/22/23 Therapeutic Exercise: UBE  L1 x 4 min (fwd/bwd) while taking subjective Overhead pulleys shoulder flexion x 2 min Supine shoulder dowel flexion with 4# 2 x 10 Standing wall slide shoulder flexion 2 x 15 Supine horizontal abduction with red 2 x 15 Row with back 2 x 15 Extension with green 2 x 15 ER with red 2 x 15 Standing scaption with 2# 2 x 15 Extension with dowel x 10 IR dowel slide up back x 10   [facility] Adult PT Treatment:                                                DATE:  01/15/23 Therapeutic Exercise: UBE L1.5 x 4 min  Pulleys Scaption slides 10 x  2 each Lower trap setting 10 x 2 each- wall slides  IR AAROM with dowel Assisted IR stretch behind back 10 sec x 2 Seated horizontal abduction red band 10 x 2   [facility] Adult PT Treatment:                                                DATE: 01/08/23 Therapeutic Exercise: UBE L1 x 4 min (fwd/bwd) while taking subjective Overhead pulleys shoulder flexion x 2 min Supine shoulder dowel flexion with 2# 2 x 10 Supine horizontal abduction with yellow 2 x 10 Sidelying abduction with 1# 2 x 15 with right Sidelying ER with 1# 2 x 15 with right Row with green 2 x 15 Extension with green 2 x 15 Standing wall slide shoulder flexion 2 x 10 Standing scaption with 1# 2 x 10   PATIENT EDUCATION: Education details: HEP Person educated: Patient Education method: Consulting civil engineer, Demonstration, Corporate treasurer cues, Verbal cues Education comprehension: verbalized understanding, returned demonstration, verbal cues required, tactile cues required, and needs further education   HOME EXERCISE PROGRAM: Access Code: [id number]    ASSESSMENT: CLINICAL IMPRESSION: Patient tolerated therapy well with no adverse effects. He demonstrates improved ROM and improved FOTO score.  Therapy continues to focus on progressing shoulder mobility and strengthening with good tolerance. He does continue to report pain at end range elevation but otherwise did not report pain with strengthening exercises, though he does report muscle fatigue. Patient would benefit from continued skilled PT to progress his mobility and strength in order to reduce pain and maximize functional ability.      OBJECTIVE IMPAIRMENTS: decreased activity tolerance, decreased ROM, decreased strength, postural dysfunction, and pain.    ACTIVITY LIMITATIONS: carrying, lifting, dressing, reach over head, and hygiene/grooming   PARTICIPATION LIMITATIONS: meal prep, cleaning, shopping,  community activity, and yard work   PERSONAL FACTORS: Age, Fitness, Past/current experiences, Time since onset of injury/illness/exacerbation, and 3+ comorbidities: see PMH above  are also affecting patient's functional outcome.      GOALS: Goals reviewed with patient? Yes   SHORT TERM GOALS: Target date: 01/22/2023   Patient will be I with initial HEP in order to progress with therapy. Baseline: 01/22/23: progressing Goal status: ONGOING   2.  PT will review FOTO with patient by 3rd visit in order to understand expected progress and outcome with therapy. Baseline:  01/08/2023: reviewed Goal status: MET   3.  Patient will report pain with shoulder movement/activity </= 6/10 in order to reduce functional limitations  Baseline: patient reports 10/10 pain 01/22/23: 7/10 pain at worst  Goal status: ONGOING    LONG TERM GOALS: Target date: 02/19/2023   Patient will be I with final HEP to maintain progress from PT. Baseline:  Goal status: INITIAL   2.  Patient will report >/= 63% status on FOTO to indicate improved functional ability. Baseline: 50% 01/29/23: 55% Goal status: ONGOING   3.  Patient will demonstrate right shoulder flexion >/= 130 deg to improve reaching into overhead cabinet Baseline: 110 deg 01/29/23: 135 Goal status: MET   4.  Patient will exhibit shoulder IR reach to L1 to improve dressing ability Baseline: PSIS 01/29/23: L5 Goal status: ONGOING   5. Patient will demonstrate bilateral shoulder strength >/= 4+/5 MMT to improve performing household tasks with less pain            Baseline: strength deficits noted above            Goal status: INITIAL     PLAN: PT FREQUENCY: 1-2x/week   PT DURATION: 8 weeks   PLANNED INTERVENTIONS: Therapeutic exercises, Therapeutic activity, Neuromuscular re-education, Balance training, Gait training, Patient/Family education, Self Care, Joint mobilization, Joint manipulation, Aquatic Therapy, Dry Needling, Spinal manipulation,  Spinal mobilization, Cryotherapy, Moist heat, Taping, Ionotophoresis 42m/ml Dexamethasone, Manual therapy, and Re-evaluation   PLAN FOR NEXT SESSION: Review HEP and progress PRN, manual/stretching for shoulder motion, progress AAROM>AROM as tolerated, work on reaching behind back, progress rotator cuff strengthening as able   JHessie Diener PTA 01/29/23 10:46 AM Phone: [phone]: [phone]

## 2023-01-29 NOTE — Progress Notes (Unsigned)
Fred [name] Fred [name] Fred [name] Phone: [phone]   Assessment and Plan:     There are no diagnoses linked to this encounter.  ***   Pertinent previous records reviewed include ***   Follow Up: ***     Subjective:   I, Fred [name], am serving as a Education administrator for Doctor Fred [name]   Chief Complaint: bilateral shoulder pain    HPI:    12/12/2022 Patient is a 82 year old male complaining of bilateral shoulder pain. Patient states  bilateral should pains for months, worse on the right, no injury , takes tylenol 2 x 650 mg Bid without relief, no radiating pain ,  decreased ROM due to pain    01/02/2023 Patient states that shoulder are about the same , has been going to PT    01/30/2023 Patient states    Relevant Historical Information: History of CABG, DM type II with insulin use, acquired hypothyroidism  Additional pertinent review of systems negative.   Current Outpatient Medications:    Accu-Chek FastClix Lancets MISC, USE 1 TO 4 TIMES DAILY AS  NEEDED/DIRECTED, Disp: 408 each, Rfl: 3   ACCU-CHEK GUIDE test strip, CHECK BLOOD SUGAR DAILY AS  DIRECTED, Disp: 100 strip, Rfl: 3   aspirin 81 MG tablet, Take 1 tablet (81 mg total) by mouth daily., Disp: , Rfl:    Blood Glucose Monitoring Suppl (ACCU-CHEK AVIVA) device, by Other route. Use as instructed 1 time daily, Disp: , Rfl:    cephALEXin (KEFLEX) 500 MG capsule, Take 1 capsule (500 mg total) by mouth 4 (four) times daily for 7 days., Disp: 28 capsule, Rfl: 0   clotrimazole (LOTRIMIN) 1 % cream, Apply 1 application  topically as needed (Groin)., Disp: , Rfl:    dapagliflozin propanediol (FARXIGA) 10 MG TABS tablet, Take 1 tablet (10 mg total) by mouth daily before breakfast., Disp: 90 tablet, Rfl: 3   Dulaglutide (TRULICITY) 3 0000000 SOPN, Inject 3 mg as directed once a week., Disp: 6 mL, Rfl: 3   Insulin Pen Needle 32G X 4 MM MISC, 1 Device by  Does not apply route daily., Disp: 100 each, Rfl: 3   levothyroxine (SYNTHROID) 25 MCG tablet, Take 1 tablet (25 mcg total) by mouth daily before breakfast., Disp: 90 tablet, Rfl: 3   metFORMIN (GLUCOPHAGE) 1000 MG tablet, Take 1 tablet (1,000 mg total) by mouth 2 (two) times daily with a meal., Disp: 180 tablet, Rfl: 3   metoprolol tartrate (LOPRESSOR) 25 MG tablet, TAKE ONE-HALF TABLET BY MOUTH  TWICE DAILY, Disp: 90 tablet, Rfl: 3   Multiple Vitamins-Minerals (MULTIVITAMIN WITH MINERALS) tablet, Take 1 tablet by mouth daily., Disp: , Rfl:    nitroGLYCERIN (NITROSTAT) 0.4 MG SL tablet, Place 1 tablet (0.4 mg total) under the tongue every 5 (five) minutes as needed for chest pain., Disp: 25 tablet, Rfl: 3   simvastatin (ZOCOR) 40 MG tablet, Take 40 mg by mouth daily at 6 PM. , Disp: , Rfl:    solifenacin (VESICARE) 5 MG tablet, Take 5 mg by mouth daily. , Disp: , Rfl:    tamsulosin (FLOMAX) 0.4 MG CAPS capsule, tamsulosin 0.4 mg capsule  TK 1 C PO QD, Disp: , Rfl:    TOUJEO MAX SOLOSTAR 300 UNIT/ML Solostar Pen, Inject 42 Units into the skin daily., Disp: 15 mL, Rfl: 6   Objective:     There were no vitals filed for this visit.    There is  no height or weight on file to calculate BMI.    Physical Exam:    ***   Electronically signed by:  Fred [name] D.[name] 4:09 PM 01/29/23

## 2023-01-30 ENCOUNTER — Ambulatory Visit (INDEPENDENT_AMBULATORY_CARE_PROVIDER_SITE_OTHER): Payer: Medicare Other | Admitting: Sports Medicine

## 2023-01-30 VITALS — BP 124/82 | HR 76 | Ht 67.0 in | Wt 184.0 lb

## 2023-01-30 DIAGNOSIS — M25511 Pain in right shoulder: Secondary | ICD-10-CM

## 2023-01-30 DIAGNOSIS — G8929 Other chronic pain: Secondary | ICD-10-CM | POA: Diagnosis not present

## 2023-01-30 DIAGNOSIS — M19011 Primary osteoarthritis, right shoulder: Secondary | ICD-10-CM

## 2023-01-30 DIAGNOSIS — M25512 Pain in left shoulder: Secondary | ICD-10-CM

## 2023-01-30 DIAGNOSIS — M7552 Bursitis of left shoulder: Secondary | ICD-10-CM

## 2023-01-30 DIAGNOSIS — M7551 Bursitis of right shoulder: Secondary | ICD-10-CM | POA: Diagnosis not present

## 2023-01-30 NOTE — Patient Instructions (Addendum)
Good to see you  Continue PT and tylenol  4 week follow up

## 2023-02-05 ENCOUNTER — Ambulatory Visit: Payer: Medicare Other | Admitting: Physical Therapy

## 2023-02-05 ENCOUNTER — Encounter: Payer: Self-pay | Admitting: Physical Therapy

## 2023-02-05 DIAGNOSIS — M25511 Pain in right shoulder: Secondary | ICD-10-CM | POA: Diagnosis not present

## 2023-02-05 DIAGNOSIS — G8929 Other chronic pain: Secondary | ICD-10-CM

## 2023-02-05 DIAGNOSIS — M6281 Muscle weakness (generalized): Secondary | ICD-10-CM

## 2023-02-05 NOTE — Therapy (Signed)
OUTPATIENT PHYSICAL THERAPY TREATMENT NOTE   Patient Name: Fred [name] MRN: [id number] DOB:[birth year], 82 y.o., male Today's Date: 02/05/2023  PCP: Shirline Frees, MD   REFERRING PROVIDER: Glennon Mac, DO   END OF SESSION:   PT End of Session - 02/05/23 0948     Visit Number 7    Number of Visits 9    Date for PT Re-Evaluation 02/19/23    Authorization Type UHC MCR    Progress Note Due on Visit 10    PT Start Time 0945    PT Stop Time 1015    PT Time Calculation (min) 30 min    Activity Tolerance Patient tolerated treatment well    Behavior During Therapy WFL for tasks assessed/performed               Past Medical History:  Diagnosis Date   Coronary artery disease    Diabetes mellitus without complication ([place])    High cholesterol    Vertigo    Past Surgical History:  Procedure Laterality Date   CARDIAC CATHETERIZATION     CORONARY ARTERY BYPASS GRAFT N/A 10/12/2019   Procedure: CORONARY ARTERY BYPASS GRAFTING (CABG) x 4 (LIMA to LAD, SVG to OM, SVG to RAMUS INTERMEDIATE, SVG to PDA) with EVH from [place] and LEFT INTERNAL [place];  Surgeon: Ivin Poot, MD;  Location: [facility];  Service: Open Heart Surgery;  Laterality: N/A;   FOOT SURGERY     KNEE SURGERY     LEFT HEART CATH AND CORONARY ANGIOGRAPHY N/A 10/11/2019   Procedure: LEFT HEART CATH AND CORONARY ANGIOGRAPHY;  Surgeon: Lorretta Harp, MD;  Location: [facility];  Service: Cardiovascular;  Laterality: N/A;   NASAL SEPTUM SURGERY     TEE WITHOUT CARDIOVERSION N/A 10/12/2019   Procedure: TRANSESOPHAGEAL ECHOCARDIOGRAM (TEE);  Surgeon: Prescott Gum, Collier Salina, MD;  Location: [facility];  Service: Open Heart Surgery;  Laterality: N/A;   Patient Active Problem List   Diagnosis Date Noted   Epigastric hernia 08/23/2020   Chronic chest wall pain 06/21/2020   Neuropathic pain of chest 06/21/2020   Postop check 06/07/2020   Diabetes mellitus ([place]) 03/21/2020    Type 2 diabetes mellitus with hyperglycemia, with long-term current use of insulin ([place]) 03/21/2020   Acquired hypothyroidism 03/21/2020   Type 2 diabetes mellitus with diabetic polyneuropathy, with long-term current use of insulin ([place]) 03/21/2020   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   CAD in native artery 10/11/2019   Coronary artery disease 10/05/2019   Hyperlipidemia 09/07/2019   Dizziness 09/07/2019   Family history of heart disease 09/07/2019   Sudden hearing loss, right 10/26/2018    REFERRING DIAG: Chronic pain of both shoulders; Subacromial bursitis of both shoulders  THERAPY DIAG:  Chronic right shoulder pain  Chronic left shoulder pain  Muscle weakness (generalized)  Rationale for Evaluation and Treatment Rehabilitation  PERTINENT HISTORY: See PMH above  PRECAUTIONS: None   SUBJECTIVE:  SUBJECTIVE STATEMENT:  Patient states he thinks that his shoulder is improving. Reports he got the stitches removed from the cut on his thumb.   PAIN:  Are you having pain? Yes:  NPRS scale: 0/10 (reports 7/10 pain when moving the shoulder) Pain location: Bilateral shoulder (right worse than left) Pain description: Intermittent, intense, sharp Aggravating factors: Shoulder movement, overhead, behind back Relieving factors: Rest   OBJECTIVE: (objective measures completed at initial evaluation unless otherwise dated) PATIENT SURVEYS:  FOTO 50% functional status, target 63% 01/29/23: FOTO status: 55%    POSTURE: Rounded shoulder and forward head posture   UPPER EXTREMITY ROM:    Active ROM Right eval Left eval Rt / Lt 01/08/2023 RT/LT 01/15/23 RT 01/29/23  Shoulder flexion 110 140 120 / 140 130/145 135  Shoulder extension         Shoulder abduction       135  Shoulder adduction         Shoulder internal  rotation Reach to PSIS Reach L1  RT-Reach to PSIS  Reach to L5  Shoulder external rotation Reach to C5 Reach T2  Reach T2 bilat Reach T2  (Blank rows = not tested)   UPPER EXTREMITY MMT:   MMT Right eval Left eval Rt / Lt 01/22/2023  Shoulder flexion 4- 4 4 / 4+  Shoulder extension 4 4   Shoulder abduction 4- 4 4 / 4+  Shoulder adduction       Shoulder internal rotation 5 5   Shoulder external rotation '4 4 4 '$ / 4  Middle trapezius       Lower trapezius       Elbow flexion 5 5   Elbow extension 5 5   Wrist flexion       Wrist extension       Wrist ulnar deviation       Wrist radial deviation       Wrist pronation       Wrist supination       Grip strength (lbs)       (Blank rows = not tested)   JOINT MOBILITY TESTING:  Gross hypomobility bilateral GHJ   PALPATION:  Tender at bilateral AC joint, periacromial region, infraspinatus               TODAY'S TREATMENT:      [facility] Adult PT Treatment:                                                DATE: 02/05/23 Therapeutic Exercise: Nustep UEs/LEs x 4 min Standing wall slide shoulder flexion 1 x 15 Standing wall slide shoulder abduction 1 x 15 Standing shoulder ER green TB 2x10 Standing "W" 2x10 Standing shoulder ER at 90 deg abd 2x10 Standing shoulder horizontal abd green TB 2x10 Standing PNF 2 green TB 2x10 Standing sleeper stretch 3x10 sec Standing shoulder IR with dowel x10    [facility] Adult PT Treatment:                                                DATE: 01/29/23 Therapeutic Exercise: UBE L1 x 4 min (fwd/bwd) while taking subjective Overhead pulleys shoulder flexion x 2 min Supine shoulder dowel flexion with 5# 2 x  10 Standing wall slide shoulder flexion 1 x 15 Standing wall slide shoulder abduction 1 x 15 Supine horizontal abduction with red 2 x 15 Row with back 2 x 15 Extension with green 2 x 15 Bilat ER with green  2 x 15, seated Standing scaption with 2# 2 x 15 Extension with dowel x 10 IR dowel slide up back  x 10   [facility] Adult PT Treatment:                                                DATE: 01/22/23 Therapeutic Exercise: UBE L1 x 4 min (fwd/bwd) while taking subjective Overhead pulleys shoulder flexion x 2 min Supine shoulder dowel flexion with 4# 2 x 10 Standing wall slide shoulder flexion 2 x 15 Supine horizontal abduction with red 2 x 15 Row with back 2 x 15 Extension with green 2 x 15 ER with red 2 x 15 Standing scaption with 2# 2 x 15 Extension with dowel x 10 IR dowel slide up back x 10   [facility] Adult PT Treatment:                                                DATE: 01/15/23 Therapeutic Exercise: UBE L1.5 x 4 min  Pulleys Scaption slides 10 x  2 each Lower trap setting 10 x 2 each- wall slides  IR AAROM with dowel Assisted IR stretch behind back 10 sec x 2 Seated horizontal abduction red band 10 x 2   [facility] Adult PT Treatment:                                                DATE: 01/08/23 Therapeutic Exercise: UBE L1 x 4 min (fwd/bwd) while taking subjective Overhead pulleys shoulder flexion x 2 min Supine shoulder dowel flexion with 2# 2 x 10 Supine horizontal abduction with yellow 2 x 10 Sidelying abduction with 1# 2 x 15 with right Sidelying ER with 1# 2 x 15 with right Row with green 2 x 15 Extension with green 2 x 15 Standing wall slide shoulder flexion 2 x 10 Standing scaption with 1# 2 x 10   PATIENT EDUCATION: Education details: HEP Person educated: Patient Education method: Consulting civil engineer, Demonstration, Corporate treasurer cues, Verbal cues Education comprehension: verbalized understanding, returned demonstration, verbal cues required, tactile cues required, and needs further education   HOME EXERCISE PROGRAM: Access Code: [id number]    ASSESSMENT: CLINICAL IMPRESSION: Focused on continuing to improve shoulder ROM and strength. Worked on scapular strengthening and stability. Progressed to green TB with good pt tolerance. Pt is demonstrating increase in shoulder IR on  right.     OBJECTIVE IMPAIRMENTS: decreased activity tolerance, decreased ROM, decreased strength, postural dysfunction, and pain.    ACTIVITY LIMITATIONS: carrying, lifting, dressing, reach over head, and hygiene/grooming   PARTICIPATION LIMITATIONS: meal prep, cleaning, shopping, community activity, and yard work   PERSONAL FACTORS: Age, Fitness, Past/current experiences, Time since onset of injury/illness/exacerbation, and 3+ comorbidities: see PMH above  are also affecting patient's functional outcome.      GOALS: Goals reviewed with patient? Yes  SHORT TERM GOALS: Target date: 01/22/2023   Patient will be I with initial HEP in order to progress with therapy. Baseline: 01/22/23: progressing Goal status: ONGOING   2.  PT will review FOTO with patient by 3rd visit in order to understand expected progress and outcome with therapy. Baseline:  01/08/2023: reviewed Goal status: MET   3.  Patient will report pain with shoulder movement/activity </= 6/10 in order to reduce functional limitations Baseline: patient reports 10/10 pain 01/22/23: 7/10 pain at worst  Goal status: ONGOING    LONG TERM GOALS: Target date: 02/19/2023   Patient will be I with final HEP to maintain progress from PT. Baseline:  Goal status: INITIAL   2.  Patient will report >/= 63% status on FOTO to indicate improved functional ability. Baseline: 50% 01/29/23: 55% Goal status: ONGOING   3.  Patient will demonstrate right shoulder flexion >/= 130 deg to improve reaching into overhead cabinet Baseline: 110 deg 01/29/23: 135 Goal status: MET   4.  Patient will exhibit shoulder IR reach to L1 to improve dressing ability Baseline: PSIS 01/29/23: L5 Goal status: ONGOING   5. Patient will demonstrate bilateral shoulder strength >/= 4+/5 MMT to improve performing household tasks with less pain            Baseline: strength deficits noted above            Goal status: INITIAL     PLAN: PT FREQUENCY:  1-2x/week   PT DURATION: 8 weeks   PLANNED INTERVENTIONS: Therapeutic exercises, Therapeutic activity, Neuromuscular re-education, Balance training, Gait training, Patient/Family education, Self Care, Joint mobilization, Joint manipulation, Aquatic Therapy, Dry Needling, Spinal manipulation, Spinal mobilization, Cryotherapy, Moist heat, Taping, Ionotophoresis '4mg'$ /ml Dexamethasone, Manual therapy, and Re-evaluation   PLAN FOR NEXT SESSION: Review HEP and progress PRN, manual/stretching for shoulder motion, progress AAROM>AROM as tolerated, work on reaching behind back, progress rotator cuff strengthening as able   [facility] April Gordy Levan, PT 02/05/23 9:49 AM Phone: [phone] Fax: [phone]

## 2023-02-08 ENCOUNTER — Other Ambulatory Visit: Payer: Self-pay | Admitting: Internal Medicine

## 2023-02-12 ENCOUNTER — Ambulatory Visit: Payer: Medicare Other | Attending: Sports Medicine | Admitting: Physical Therapy

## 2023-02-12 DIAGNOSIS — M25511 Pain in right shoulder: Secondary | ICD-10-CM | POA: Insufficient documentation

## 2023-02-12 DIAGNOSIS — G8929 Other chronic pain: Secondary | ICD-10-CM | POA: Diagnosis present

## 2023-02-12 DIAGNOSIS — M25512 Pain in left shoulder: Secondary | ICD-10-CM | POA: Diagnosis present

## 2023-02-12 DIAGNOSIS — M6281 Muscle weakness (generalized): Secondary | ICD-10-CM | POA: Insufficient documentation

## 2023-02-12 NOTE — Therapy (Signed)
OUTPATIENT PHYSICAL THERAPY TREATMENT NOTE   Patient Name: Fred [name] MRN: [id number] DOB:[birth year], 82 y.o., male Today's Date: 02/12/2023  PCP: Shirline Frees, MD   REFERRING PROVIDER: Glennon Mac, DO   END OF SESSION:   PT End of Session - 02/12/23 0849     Visit Number 8    Number of Visits 9    Date for PT Re-Evaluation 02/19/23    Authorization Type U[employer] MCR    PT Start Time 0848    PT Stop Time 0930    PT Time Calculation (min) 42 min               Past Medical History:  Diagnosis Date   Coronary artery disease    Diabetes mellitus without complication ([place])    High cholesterol    Vertigo    Past Surgical History:  Procedure Laterality Date   CARDIAC CATHETERIZATION     CORONARY ARTERY BYPASS GRAFT N/A 10/12/2019   Procedure: CORONARY ARTERY BYPASS GRAFTING (CABG) x 4 (LIMA to LAD, SVG to OM, SVG to RAMUS INTERMEDIATE, SVG to PDA) with EVH from [place] and LEFT INTERNAL MAMMARY ARTERY HARVEST;  Surgeon: Ivin Poot, MD;  Location: [facility];  Service: Open Heart Surgery;  Laterality: N/A;   FOOT SURGERY     KNEE SURGERY     LEFT HEART CATH AND CORONARY ANGIOGRAPHY N/A 10/11/2019   Procedure: LEFT HEART CATH AND CORONARY ANGIOGRAPHY;  Surgeon: Lorretta Harp, MD;  Location: [facility];  Service: Cardiovascular;  Laterality: N/A;   NASAL SEPTUM SURGERY     TEE WITHOUT CARDIOVERSION N/A 10/12/2019   Procedure: TRANSESOPHAGEAL ECHOCARDIOGRAM (TEE);  Surgeon: Prescott Gum, Collier Salina, MD;  Location: [facility];  Service: Open Heart Surgery;  Laterality: N/A;   Patient Active Problem List   Diagnosis Date Noted   Epigastric hernia 08/23/2020   Chronic chest wall pain 06/21/2020   Neuropathic pain of chest 06/21/2020   Postop check 06/07/2020   Diabetes mellitus ([place]) 03/21/2020   Type 2 diabetes mellitus with hyperglycemia, with long-term current use of insulin ([place]) 03/21/2020   Acquired hypothyroidism 03/21/2020   Type 2  diabetes mellitus with diabetic polyneuropathy, with long-term current use of insulin ([place]) 03/21/2020   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   CAD in native artery 10/11/2019   Coronary artery disease 10/05/2019   Hyperlipidemia 09/07/2019   Dizziness 09/07/2019   Family history of heart disease 09/07/2019   Sudden hearing loss, right 10/26/2018    REFERRING DIAG: Chronic pain of both shoulders; Subacromial bursitis of both shoulders  THERAPY DIAG:  Chronic right shoulder pain  Chronic left shoulder pain  Muscle weakness (generalized)  Rationale for Evaluation and Treatment Rehabilitation  PERTINENT HISTORY: See PMH above  PRECAUTIONS: None   SUBJECTIVE:  SUBJECTIVE STATEMENT:  Patient states he thinks that his shoulder is improving. Reports he got the stitches removed from the cut on his thumb.   PAIN:  Are you having pain? Yes:  NPRS scale: 0/10 (reports 7/10 pain when moving the shoulder) Pain location: Bilateral shoulder (right worse than left) Pain description: Intermittent, intense, sharp Aggravating factors: Shoulder movement, overhead, behind back Relieving factors: Rest   OBJECTIVE: (objective measures completed at initial evaluation unless otherwise dated) PATIENT SURVEYS:  FOTO 50% functional status, target 63% 01/29/23: FOTO status: 55%    POSTURE: Rounded shoulder and forward head posture   UPPER EXTREMITY ROM:    Active ROM Right eval Left eval Rt / Lt 01/08/2023 RT/LT 01/15/23 RT 01/29/23 RT  02/12/23  Shoulder flexion 110 140 120 / 140 130/145 135   Shoulder extension          Shoulder abduction       135   Shoulder adduction          Shoulder internal rotation Reach to PSIS Reach L1  RT-Reach to PSIS  Reach to L5 Reaches L1  Shoulder external rotation Reach to C5 Reach T2  Reach T2  bilat Reach T2   (Blank rows = not tested)   UPPER EXTREMITY MMT:   MMT Right eval Left eval Rt / Lt 01/22/2023  Shoulder flexion 4- 4 4 / 4+  Shoulder extension 4 4   Shoulder abduction 4- 4 4 / 4+  Shoulder adduction       Shoulder internal rotation 5 5   Shoulder external rotation '4 4 4 '$ / 4  Middle trapezius       Lower trapezius       Elbow flexion 5 5   Elbow extension 5 5   Wrist flexion       Wrist extension       Wrist ulnar deviation       Wrist radial deviation       Wrist pronation       Wrist supination       Grip strength (lbs)       (Blank rows = not tested)   JOINT MOBILITY TESTING:  Gross hypomobility bilateral GHJ   PALPATION:  Tender at bilateral AC joint, periacromial region, infraspinatus               TODAY'S TREATMENT:      [facility] Adult PT Treatment:                                                DATE: 02/12/23 Therapeutic Exercise: UBE Row Ext IR green ER  green Standing shoulder horizontal abd green TB 2x10 Doorway stretch Sleeper stretch on wall Towel stretch for IR IR UE ranger x15  Standing alternating diagonals green band   Therapeutic Activity: Donning and doffing jacket - min discomfort    [facility] Adult PT Treatment:                                                DATE: 02/05/23 Therapeutic Exercise: Nustep UEs/LEs x 4 min Standing wall slide shoulder flexion 1 x 15 Standing wall slide shoulder abduction 1 x 15 Standing shoulder ER green TB 2x10 Standing "W" 2x10  Standing shoulder ER at 90 deg abd 2x10 Standing shoulder horizontal abd green TB 2x10 Standing PNF 2 green TB 2x10 Standing sleeper stretch 3x10 sec Standing shoulder IR with dowel x10    [facility] Adult PT Treatment:                                                DATE: 01/29/23 Therapeutic Exercise: UBE L1 x 4 min (fwd/bwd) while taking subjective Overhead pulleys shoulder flexion x 2 min Supine shoulder dowel flexion with 5# 2 x 10 Standing wall slide shoulder flexion  1 x 15 Standing wall slide shoulder abduction 1 x 15 Supine horizontal abduction with red 2 x 15 Row with back 2 x 15 Extension with green 2 x 15 Bilat ER with green  2 x 15, seated Standing scaption with 2# 2 x 15 Extension with dowel x 10 IR dowel slide up back x 10   [facility] Adult PT Treatment:                                                DATE: 01/22/23 Therapeutic Exercise: UBE L1 x 4 min (fwd/bwd) while taking subjective Overhead pulleys shoulder flexion x 2 min Supine shoulder dowel flexion with 4# 2 x 10 Standing wall slide shoulder flexion 2 x 15 Supine horizontal abduction with red 2 x 15 Row with back 2 x 15 Extension with green 2 x 15 ER with red 2 x 15 Standing scaption with 2# 2 x 15 Extension with dowel x 10 IR dowel slide up back x 10   [facility] Adult PT Treatment:                                                DATE: 01/15/23 Therapeutic Exercise: UBE L1.5 x 4 min  Pulleys Scaption slides 10 x  2 each Lower trap setting 10 x 2 each- wall slides  IR AAROM with dowel Assisted IR stretch behind back 10 sec x 2 Seated horizontal abduction red band 10 x 2   [facility] Adult PT Treatment:                                                DATE: 01/08/23 Therapeutic Exercise: UBE L1 x 4 min (fwd/bwd) while taking subjective Overhead pulleys shoulder flexion x 2 min Supine shoulder dowel flexion with 2# 2 x 10 Supine horizontal abduction with yellow 2 x 10 Sidelying abduction with 1# 2 x 15 with right Sidelying ER with 1# 2 x 15 with right Row with green 2 x 15 Extension with green 2 x 15 Standing wall slide shoulder flexion 2 x 10 Standing scaption with 1# 2 x 10   PATIENT EDUCATION: Education details: HEP Person educated: Patient Education method: Consulting civil engineer, Demonstration, Corporate treasurer cues, Verbal cues Education comprehension: verbalized understanding, returned demonstration, verbal cues required, tactile cues required, and needs further education   HOME EXERCISE  PROGRAM: Access Code: [id number]    ASSESSMENT: CLINICAL  IMPRESSION: Focused on continuing to improve shoulder ROM and strength. He reports less intensity of pain reaching up but still significant pain with reaching back to doff coat. Upon performing the activity, pt was able to easily doff his coat with only min discomfort. He has unbuttoned his wrist buttons on his coat to allow easier doffing. Buttoned the buttons today and he was able to doff coat with min discomfort. His IR reach is to his upper lumbar with discomfort.       OBJECTIVE IMPAIRMENTS: decreased activity tolerance, decreased ROM, decreased strength, postural dysfunction, and pain.    ACTIVITY LIMITATIONS: carrying, lifting, dressing, reach over head, and hygiene/grooming   PARTICIPATION LIMITATIONS: meal prep, cleaning, shopping, community activity, and yard work   PERSONAL FACTORS: Age, Fitness, Past/current experiences, Time since onset of injury/illness/exacerbation, and 3+ comorbidities: see PMH above  are also affecting patient's functional outcome.      GOALS: Goals reviewed with patient? Yes   SHORT TERM GOALS: Target date: 01/22/2023   Patient will be I with initial HEP in order to progress with therapy. Baseline: 01/22/23: progressing 02/12/23: occasional compliance  Goal status: ONGOING   2.  PT will review FOTO with patient by 3rd visit in order to understand expected progress and outcome with therapy. Baseline:  01/08/2023: reviewed Goal status: MET   3.  Patient will report pain with shoulder movement/activity </= 6/10 in order to reduce functional limitations Baseline: patient reports 10/10 pain 01/22/23: 7/10 pain at worst  02/12/23: 7/10 still with reaching back, reaching up is better , although able to reach back with min discomfort to doff coat  Goal status: ONGOING    LONG TERM GOALS: Target date: 02/19/2023   Patient will be I with final HEP to maintain progress from PT. Baseline:  02/12/23:  02/12/23 Goal status: ONGOING   2.  Patient will report >/= 63% status on FOTO to indicate improved functional ability. Baseline: 50% 01/29/23: 55% Goal status: ONGOING   3.  Patient will demonstrate right shoulder flexion >/= 130 deg to improve reaching into overhead cabinet Baseline: 110 deg 01/29/23: 135 Goal status: MET   4.  Patient will exhibit shoulder IR reach to L1 to improve dressing ability Baseline: PSIS 01/29/23: L5 02/12/23: L1 Goal status: MET   5. Patient will demonstrate bilateral shoulder strength >/= 4+/5 MMT to improve performing household tasks with less pain            Baseline: strength deficits noted above            Goal status: INITIAL     PLAN: PT FREQUENCY: 1-2x/week   PT DURATION: 8 weeks   PLANNED INTERVENTIONS: Therapeutic exercises, Therapeutic activity, Neuromuscular re-education, Balance training, Gait training, Patient/Family education, Self Care, Joint mobilization, Joint manipulation, Aquatic Therapy, Dry Needling, Spinal manipulation, Spinal mobilization, Cryotherapy, Moist heat, Taping, Ionotophoresis '4mg'$ /ml Dexamethasone, Manual therapy, and Re-evaluation   PLAN FOR NEXT SESSION: check LTGs, FOTO, review and DC   Dorene Ar, PTA 02/12/23 12:53 PM Phone: [phone] Fax: [phone]

## 2023-02-17 NOTE — Therapy (Signed)
OUTPATIENT PHYSICAL THERAPY TREATMENT NOTE  DISCHARGE   Patient Name: Fred [name] MRN: [id number] DOB:[birth year], 82 y.o., male Today's Date: 02/19/2023  PCP: Shirline Frees, MD   REFERRING PROVIDER: Glennon Mac, DO   END OF SESSION:   PT End of Session - 02/19/23 0922     Visit Number 9    Number of Visits 9    Date for PT Re-Evaluation 02/19/23    Authorization Type UHC MCR    Progress Note Due on Visit 10    PT Start Time 0845    PT Stop Time 0930    PT Time Calculation (min) 45 min    Activity Tolerance Patient tolerated treatment well    Behavior During Therapy WFL for tasks assessed/performed                Past Medical History:  Diagnosis Date   Coronary artery disease    Diabetes mellitus without complication ([place])    High cholesterol    Vertigo    Past Surgical History:  Procedure Laterality Date   CARDIAC CATHETERIZATION     CORONARY ARTERY BYPASS GRAFT N/A 10/12/2019   Procedure: CORONARY ARTERY BYPASS GRAFTING (CABG) x 4 (LIMA to LAD, SVG to OM, SVG to RAMUS INTERMEDIATE, SVG to PDA) with EVH from [place] and LEFT INTERNAL MAMMARY ARTERY HARVEST;  Surgeon: Ivin Poot, MD;  Location: [facility];  Service: Open Heart Surgery;  Laterality: N/A;   FOOT SURGERY     KNEE SURGERY     LEFT HEART CATH AND CORONARY ANGIOGRAPHY N/A 10/11/2019   Procedure: LEFT HEART CATH AND CORONARY ANGIOGRAPHY;  Surgeon: Lorretta Harp, MD;  Location: [facility];  Service: Cardiovascular;  Laterality: N/A;   NASAL SEPTUM SURGERY     TEE WITHOUT CARDIOVERSION N/A 10/12/2019   Procedure: TRANSESOPHAGEAL ECHOCARDIOGRAM (TEE);  Surgeon: Prescott Gum, Collier Salina, MD;  Location: [facility];  Service: Open Heart Surgery;  Laterality: N/A;   Patient Active Problem List   Diagnosis Date Noted   Epigastric hernia 08/23/2020   Chronic chest wall pain 06/21/2020   Neuropathic pain of chest 06/21/2020   Postop check 06/07/2020   Diabetes mellitus ([place])  03/21/2020   Type 2 diabetes mellitus with hyperglycemia, with long-term current use of insulin ([place]) 03/21/2020   Acquired hypothyroidism 03/21/2020   Type 2 diabetes mellitus with diabetic polyneuropathy, with long-term current use of insulin ([place]) 03/21/2020   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   CAD in native artery 10/11/2019   Coronary artery disease 10/05/2019   Hyperlipidemia 09/07/2019   Dizziness 09/07/2019   Family history of heart disease 09/07/2019   Sudden hearing loss, right 10/26/2018    REFERRING DIAG: Chronic pain of both shoulders; Subacromial bursitis of both shoulders  THERAPY DIAG:  Chronic right shoulder pain  Chronic left shoulder pain  Muscle weakness (generalized)  Rationale for Evaluation and Treatment Rehabilitation  PERTINENT HISTORY: See PMH above  PRECAUTIONS: None   SUBJECTIVE:  SUBJECTIVE STATEMENT:  Patient states he shoulder is doing good, still has pain at the top of overhead reach and when reaching behind his back.  PAIN:  Are you having pain? Yes:  NPRS scale: 0/10 (reports 5/10 pain when moving the shoulder) Pain location: Bilateral shoulder (right worse than left) Pain description: Intermittent, intense, sharp Aggravating factors: Shoulder movement, overhead, behind back Relieving factors: Rest   OBJECTIVE: (objective measures completed at initial evaluation unless otherwise dated) PATIENT SURVEYS:  FOTO 50% functional status, target 63% 01/29/23: FOTO status: 55%  02/19/2023: 57%   POSTURE: Rounded shoulder and forward head posture   UPPER EXTREMITY ROM:    Active ROM Right eval Left eval Rt / Lt 01/08/2023 RT/LT 01/15/23 RT 01/29/23 RT  02/12/23 Rt / Lt 02/19/2023  Shoulder flexion 110 140 120 / 140 130/145 135  135 / 142  Shoulder extension            Shoulder abduction       135    Shoulder adduction           Shoulder internal rotation Reach to PSIS Reach L1  RT-Reach to PSIS  Reach to L5 Reaches L1 L1 / T12  Shoulder external rotation Reach to C5 Reach T2  Reach T2 bilat Reach T2    (Blank rows = not tested)   UPPER EXTREMITY MMT:   MMT Right eval Left eval Rt / Lt 01/22/2023 Rt / Lt  Shoulder flexion 4- 4 4 / 4+ 4+  /5  Shoulder extension 4 4    Shoulder abduction 4- 4 4 / 4+   Shoulder adduction        Shoulder internal rotation 5 5    Shoulder external rotation '4 4 4 '$ / 4 4+ / 4+  Middle trapezius        Lower trapezius        Elbow flexion 5 5    Elbow extension 5 5    Wrist flexion        Wrist extension        Wrist ulnar deviation        Wrist radial deviation        Wrist pronation        Wrist supination        Grip strength (lbs)        (Blank rows = not tested)   JOINT MOBILITY TESTING:  Gross hypomobility bilateral GHJ   PALPATION:  Tender at bilateral AC joint, periacromial region, infraspinatus               TODAY'S TREATMENT:      [facility] Adult PT Treatment:                                                DATE: 02/19/23 Therapeutic Exercise: UBE L1 x 4 min (fwd/bwd) while taking subjective Overhead pulleys shoulder flexion x 2 min Supine shoulder dowel flexion with 2# x 20 Supine horizontal abduction with green red 2 x 15 Standing wall slide shoulder flexion 2 x 15 Doorway pec stretch 3 x 15 sec Row with blue 2 x 15 Standing scaption with 3# 2 x 15 Extension with dowel with 2# x 10 IR dowel slide up back x 10 IR behind back stretch with towel x 10   [facility] Adult PT Treatment:  DATE: 02/12/23 Therapeutic Exercise: UBE Row Ext IR green ER  green Standing shoulder horizontal abd green TB 2x10 Doorway stretch Sleeper stretch on wall Towel stretch for IR IR UE ranger x15  Standing alternating diagonals green band  Therapeutic Activity: Donning  and doffing jacket - min discomfort   [facility] Adult PT Treatment:                                                DATE: 02/05/23 Therapeutic Exercise: Nustep UEs/LEs x 4 min Standing wall slide shoulder flexion 1 x 15 Standing wall slide shoulder abduction 1 x 15 Standing shoulder ER green TB 2x10 Standing "W" 2x10 Standing shoulder ER at 90 deg abd 2x10 Standing shoulder horizontal abd green TB 2x10 Standing PNF 2 green TB 2x10 Standing sleeper stretch 3x10 sec Standing shoulder IR with dowel x10   PATIENT EDUCATION: Education details: POC discharge, HEP Person educated: Patient Education method: Explanation Education comprehension: Verbalized understanding   HOME EXERCISE PROGRAM: Access Code: [email or web address]    ASSESSMENT: CLINICAL IMPRESSION: Patient tolerated therapy well with no adverse effects. He demonstrates overall improved motion and strength of the right shoulder, but he does continue to report pain at end ranges of motion and limitations with functional ability on FOTO. He is independent with his HEP and will be discharged from PT due to being pleased with functional status.    OBJECTIVE IMPAIRMENTS: decreased activity tolerance, decreased ROM, decreased strength, postural dysfunction, and pain.    ACTIVITY LIMITATIONS: carrying, lifting, dressing, reach over head, and hygiene/grooming   PARTICIPATION LIMITATIONS: meal prep, cleaning, shopping, community activity, and yard work   PERSONAL FACTORS: Age, Fitness, Past/current experiences, Time since onset of injury/illness/exacerbation, and 3+ comorbidities: see PMH above  are also affecting patient's functional outcome.      GOALS: Goals reviewed with patient? Yes   SHORT TERM GOALS: Target date: 01/22/2023   Patient will be I with initial HEP in order to progress with therapy. Baseline: 01/22/23: progressing 02/12/23: occasional compliance  02/19/2023: independent Goal status: MET   2.  PT will review FOTO with patient  by 3rd visit in order to understand expected progress and outcome with therapy. Baseline:  01/08/2023: reviewed Goal status: MET   3.  Patient will report pain with shoulder movement/activity </= 6/10 in order to reduce functional limitations Baseline: patient reports 10/10 pain 01/22/23: 7/10 pain at worst  02/12/23: 7/10 still with reaching back, reaching up is better , although able to reach back with min discomfort to doff coat  02/19/2023: 5/10 pain Goal status: MET   LONG TERM GOALS: Target date: 02/19/2023   Patient will be I with final HEP to maintain progress from PT. Baseline:  02/12/23: 02/12/23 02/19/2023: independent Goal status: MET   2.  Patient will report >/= 63% status on FOTO to indicate improved functional ability. Baseline: 50% 01/29/23: 55% 02/19/2023: 57% Goal status: PARTIALLY MET   3.  Patient will demonstrate right shoulder flexion >/= 130 deg to improve reaching into overhead cabinet Baseline: 110 deg 01/29/23: 135 Goal status: MET   4.  Patient will exhibit shoulder IR reach to L1 to improve dressing ability Baseline: PSIS 01/29/23: L5 02/12/23: L1 Goal status: MET   5. Patient will demonstrate bilateral shoulder strength >/= 4+/5 MMT to improve performing household tasks with less pain  Baseline: strength deficits noted above 02/19/2023: 4+/5 MMT            Goal status: MET     PLAN: PT FREQUENCY: 1-2x/week   PT DURATION: 8 weeks   PLANNED INTERVENTIONS: Therapeutic exercises, Therapeutic activity, Neuromuscular re-education, Balance training, Gait training, Patient/Family education, Self Care, Joint mobilization, Joint manipulation, Aquatic Therapy, Dry Needling, Spinal manipulation, Spinal mobilization, Cryotherapy, Moist heat, Taping, Ionotophoresis '4mg'$ /ml Dexamethasone, Manual therapy, and Re-evaluation   PLAN FOR NEXT SESSION: NA - discharge   Bobette Mo, PT 02/19/23 5:13 PM Phone: [phone] Fax: [phone]     PHYSICAL  THERAPY DISCHARGE SUMMARY  Visits from Start of Care: 9  Current functional level related to goals / functional outcomes: See above   Remaining deficits: See above   Education / Equipment: HEP   Patient agrees to discharge. Patient goals were partially met. Patient is being discharged due to being pleased with the current functional level.

## 2023-02-19 ENCOUNTER — Other Ambulatory Visit: Payer: Self-pay

## 2023-02-19 ENCOUNTER — Encounter: Payer: Self-pay | Admitting: Physical Therapy

## 2023-02-19 ENCOUNTER — Ambulatory Visit: Payer: Medicare Other | Admitting: Physical Therapy

## 2023-02-19 DIAGNOSIS — M25511 Pain in right shoulder: Secondary | ICD-10-CM | POA: Diagnosis not present

## 2023-02-19 DIAGNOSIS — G8929 Other chronic pain: Secondary | ICD-10-CM

## 2023-02-19 DIAGNOSIS — M6281 Muscle weakness (generalized): Secondary | ICD-10-CM

## 2023-02-19 NOTE — Patient Instructions (Signed)
Access Code: [email or web address] URL: [email or web address] Date: 02/19/2023 Prepared by: Hilda Blades  Exercises - Standing Row with Anchored Resistance  - 1 x daily - 2 sets - 10 reps - Shoulder External Rotation with Anchored Resistance  - 1 x daily - 2 sets - 10 reps - Standing shoulder flexion wall slides  - 2 x daily - 10 reps - 5 seconds hold - Standing Bilateral Shoulder Internal Rotation AAROM with Dowel  - 1 x daily - 7 x weekly - 2 sets - 10 reps - 5 hold - Standing Shoulder Internal Rotation Stretch Behind Back  - 1 x daily - 7 x weekly - 1 sets - 5 reps - 10 hold - Standing Shoulder Horizontal Abduction with Resistance  - 1 x daily - 7 x weekly - 2 sets - 10 reps - Standing Sleeper Stretch at Wall  - 1 x daily - 7 x weekly - 3 sets - 10 sec hold - Shoulder External Rotation in 45 Degrees Abduction  - 1 x daily - 7 x weekly - 2 sets - 10 reps

## 2023-02-20 NOTE — Progress Notes (Signed)
Fred [name] D.[facility] Phone: [phone]   Assessment and Plan:     1. Osteoarthritis of right glenohumeral joint 2. Chronic pain of both shoulders  -Chronic with exacerbation, subsequent visit - Overall improvement in strength range of motion after subacromial CSI at previous office visit on 01/30/2023 and physical therapy - Patient has completed physical therapy, though I recommend he continue HEP - Patient continuing to have shoulder pain primarily with internal rotation that I believe is likely more related to glenohumeral osteoarthritis.  Patient did respond well to intra-articular CSI on 01/02/2023 and elects for repeat injection today.  Tolerated well per note below.  Discussed that ideally we would wait at least 3 months in between injections, however due to patient's current pain level and not wishing to proceed with surgery, patient elects to repeat CSI today before that timeframe. - Continue Tylenol 500 to 1000 mg tablets 2-3 times a day for day-to-day pain relief  Procedure: Ultrasound Guided Glenohumeral Joint Injection Side: Right Diagnosis: Flare of osteoarthritis [place] Indication:  - accuracy is paramount for diagnosis - to ensure therapeutic efficacy or procedural success - to reduce procedural risk  After explaining the procedure, viable alternatives, risks, and answering any questions, consent was given verbally. The site was cleaned with chlorhexidine prep. An ultrasound transducer was placed on the posterior shoulder.  The posterior capsule, labrum, and infraspinatus were identified.  A steroid injection was performed under ultrasound guidance with sterile technique using  68ml of 1% lidocaine without epinephrine and 40 mg of triamcinolone (KENALOG) 40mg /ml. This was well tolerated and resulted in  relief.  Needle was removed and dressing placed and post injection instructions were given including   a discussion of likely return of pain today after the anesthetic wears off (with the possibility of worsened pain) until the steroid starts to work in 1-3 days.   Pt was advised to call or return to clinic if these symptoms worsen or fail to improve as anticipated.     Pertinent previous records reviewed include physical therapy note 02/19/2023, physical therapy note 02/12/2023, physical therapy note 02/05/2023   Follow Up: 6 weeks for reevaluation.  Patient is not interested in surgery at this time, so we can continue to alternate between subacromial CSI and intra-articular CSI as needed for patient's pain control   Subjective:   I, Moenique Parris, am serving as a Education administrator for Doctor Glennon Mac   Chief Complaint: bilateral shoulder pain    HPI:    12/12/2022 Patient is a 82 year old male complaining of bilateral shoulder pain. Patient states  bilateral should pains for months, worse on the right, no injury , takes tylenol 2 x 650 mg Bid without relief, no radiating pain ,  decreased ROM due to pain    01/02/2023 Patient states that shoulder are about the same , has been going to PT    01/30/2023 Patient states that might be improving some , still hurts to do certain movements    02/27/2023 Patient states he still has pain right shoulder depending on how he moves, finished PT last week unles he gets rx more     Relevant Historical Information: History of CABG, DM type II with insulin use, acquired hypothyroidism  Additional pertinent review of systems negative.   Current Outpatient Medications:    Accu-Chek FastClix Lancets MISC, USE 1 TO 4 TIMES DAILY AS  NEEDED/DIRECTED, Disp: 408 each, Rfl: 3  ACCU-CHEK GUIDE test strip, USE TO CHECK BLOOD SUGAR DAILY  AS DIRECTED, Disp: 100 strip, Rfl: 10   aspirin 81 MG tablet, Take 1 tablet (81 mg total) by mouth daily., Disp: , Rfl:    Blood Glucose Monitoring Suppl (ACCU-CHEK AVIVA) device, by Other route. Use as instructed 1 time daily,  Disp: , Rfl:    clotrimazole (LOTRIMIN) 1 % cream, Apply 1 application  topically as needed (Groin)., Disp: , Rfl:    dapagliflozin propanediol (FARXIGA) 10 MG TABS tablet, Take 1 tablet (10 mg total) by mouth daily before breakfast., Disp: 90 tablet, Rfl: 3   Dulaglutide (TRULICITY) 3 0000000 SOPN, Inject 3 mg as directed once a week., Disp: 6 mL, Rfl: 3   Insulin Pen Needle 32G X 4 MM MISC, 1 Device by Does not apply route daily., Disp: 100 each, Rfl: 3   levothyroxine (SYNTHROID) 25 MCG tablet, Take 1 tablet (25 mcg total) by mouth daily before breakfast., Disp: 90 tablet, Rfl: 3   metFORMIN (GLUCOPHAGE) 1000 MG tablet, Take 1 tablet (1,000 mg total) by mouth 2 (two) times daily with a meal., Disp: 180 tablet, Rfl: 3   metoprolol tartrate (LOPRESSOR) 25 MG tablet, TAKE ONE-HALF TABLET BY MOUTH  TWICE DAILY, Disp: 90 tablet, Rfl: 3   Multiple Vitamins-Minerals (MULTIVITAMIN WITH MINERALS) tablet, Take 1 tablet by mouth daily., Disp: , Rfl:    nitroGLYCERIN (NITROSTAT) 0.4 MG SL tablet, Place 1 tablet (0.4 mg total) under the tongue every 5 (five) minutes as needed for chest pain., Disp: 25 tablet, Rfl: 3   simvastatin (ZOCOR) 40 MG tablet, Take 40 mg by mouth daily at 6 PM. , Disp: , Rfl:    solifenacin (VESICARE) 5 MG tablet, Take 5 mg by mouth daily. , Disp: , Rfl:    tamsulosin (FLOMAX) 0.4 MG CAPS capsule, tamsulosin 0.4 mg capsule  TK 1 C PO QD, Disp: , Rfl:    TOUJEO MAX SOLOSTAR 300 UNIT/ML Solostar Pen, Inject 42 Units into the skin daily., Disp: 15 mL, Rfl: 6   Objective:     Vitals:   02/27/23 0853  BP: 108/62  Weight: 187 lb (84.8 kg)  Height: 5\' 7"  (1.702 m)      Body mass index is 29.29 kg/m.    Physical Exam:    Gen: Appears well, nad, nontoxic and pleasant Neuro:sensation intact, strength is 5/5 with df/pf/inv/ev, muscle tone wnl Skin: no suspicious lesion or defmority Psych: A&O, appropriate mood and affect   Right shoulder: no deformity, swelling or muscle  wasting No scapular winging Right FF 150, abd 150, int 25, ext 70 TTP  , deltoid NTTP over the AC, [place], clavicle,  coracoid, biceps groove, humerus,  , trapezius, cervical spine Positive Neer, Hawking's, empty can Neg ant drawer, sulcus sign,  Negative Spurling's test bilat FROM of neck     Electronically signed by:  Fred [name] D.[facility] 9:18 AM 02/27/23

## 2023-02-27 ENCOUNTER — Other Ambulatory Visit: Payer: Self-pay

## 2023-02-27 ENCOUNTER — Ambulatory Visit (INDEPENDENT_AMBULATORY_CARE_PROVIDER_SITE_OTHER): Payer: Medicare Other | Admitting: Sports Medicine

## 2023-02-27 VITALS — BP 108/62 | Ht 67.0 in | Wt 187.0 lb

## 2023-02-27 DIAGNOSIS — M25511 Pain in right shoulder: Secondary | ICD-10-CM

## 2023-02-27 DIAGNOSIS — G8929 Other chronic pain: Secondary | ICD-10-CM

## 2023-02-27 DIAGNOSIS — M25512 Pain in left shoulder: Secondary | ICD-10-CM | POA: Diagnosis not present

## 2023-02-27 DIAGNOSIS — M19011 Primary osteoarthritis, right shoulder: Secondary | ICD-10-CM

## 2023-02-27 NOTE — Patient Instructions (Addendum)
Good to see you  Tylenol [phone] mg 2-3 times a day for pain relief  Continue HEP  6 week follow up

## 2023-03-21 ENCOUNTER — Other Ambulatory Visit: Payer: Self-pay | Admitting: Family Medicine

## 2023-03-21 DIAGNOSIS — R1030 Lower abdominal pain, unspecified: Secondary | ICD-10-CM

## 2023-04-09 NOTE — Progress Notes (Unsigned)
Fred [name] D.[facility] [address] [place] [address] Phone: [phone]   Assessment and Plan:     There are no diagnoses linked to this encounter.  ***   Pertinent previous records reviewed include ***   Follow Up: ***     Subjective:   I, Fred [name], am serving as a Neurosurgeon for Doctor Richardean Sale   Chief Complaint: bilateral shoulder pain    HPI:    12/12/2022 Patient is a 82 year old male complaining of bilateral shoulder pain. Patient states  bilateral should pains for months, worse on the right, no injury , takes tylenol 2 x 650 mg Bid without relief, no radiating pain ,  decreased ROM due to pain    01/02/2023 Patient states that shoulder are about the same , has been going to PT    01/30/2023 Patient states that might be improving some , still hurts to do certain movements    02/27/2023 Patient states he still has pain right shoulder depending on how he moves, finished PT last week unles he gets rx more    04/10/2023 Patient states    Relevant Historical Information: History of CABG, DM type II with insulin use, acquired hypothyroidism Additional pertinent review of systems negative.   Current Outpatient Medications:    Accu-Chek FastClix Lancets MISC, USE 1 TO 4 TIMES DAILY AS  NEEDED/DIRECTED, Disp: 408 each, Rfl: 3   ACCU-CHEK GUIDE test strip, USE TO CHECK BLOOD SUGAR DAILY  AS DIRECTED, Disp: 100 strip, Rfl: 10   aspirin 81 MG tablet, Take 1 tablet (81 mg total) by mouth daily., Disp: , Rfl:    Blood Glucose Monitoring Suppl (ACCU-CHEK AVIVA) device, by Other route. Use as instructed 1 time daily, Disp: , Rfl:    clotrimazole (LOTRIMIN) 1 % cream, Apply 1 application  topically as needed (Groin)., Disp: , Rfl:    dapagliflozin propanediol (FARXIGA) 10 MG TABS tablet, Take 1 tablet (10 mg total) by mouth daily before breakfast., Disp: 90 tablet, Rfl: 3   Dulaglutide (TRULICITY) 3 MG/0.5ML SOPN, Inject 3  mg as directed once a week., Disp: 6 mL, Rfl: 3   Insulin Pen Needle 32G X 4 MM MISC, 1 Device by Does not apply route daily., Disp: 100 each, Rfl: 3   levothyroxine (SYNTHROID) 25 MCG tablet, Take 1 tablet (25 mcg total) by mouth daily before breakfast., Disp: 90 tablet, Rfl: 3   metFORMIN (GLUCOPHAGE) 1000 MG tablet, Take 1 tablet (1,000 mg total) by mouth 2 (two) times daily with a meal., Disp: 180 tablet, Rfl: 3   metoprolol tartrate (LOPRESSOR) 25 MG tablet, TAKE ONE-HALF TABLET BY MOUTH  TWICE DAILY, Disp: 90 tablet, Rfl: 3   Multiple Vitamins-Minerals (MULTIVITAMIN WITH MINERALS) tablet, Take 1 tablet by mouth daily., Disp: , Rfl:    nitroGLYCERIN (NITROSTAT) 0.4 MG SL tablet, Place 1 tablet (0.4 mg total) under the tongue every 5 (five) minutes as needed for chest pain., Disp: 25 tablet, Rfl: 3   simvastatin (ZOCOR) 40 MG tablet, Take 40 mg by mouth daily at 6 PM. , Disp: , Rfl:    solifenacin (VESICARE) 5 MG tablet, Take 5 mg by mouth daily. , Disp: , Rfl:    tamsulosin (FLOMAX) 0.4 MG CAPS capsule, tamsulosin 0.4 mg capsule  TK 1 C PO QD, Disp: , Rfl:    TOUJEO MAX SOLOSTAR 300 UNIT/ML Solostar Pen, Inject 42 Units into the skin daily., Disp: 15 mL, Rfl: 6  Objective:     There were no vitals filed for this visit.    There is no height or weight on file to calculate BMI.    Physical Exam:    ***   Electronically signed by:  Fred [name] D.[facility] 7:30 AM 04/09/23

## 2023-04-10 ENCOUNTER — Ambulatory Visit (INDEPENDENT_AMBULATORY_CARE_PROVIDER_SITE_OTHER): Payer: Medicare Other | Admitting: Sports Medicine

## 2023-04-10 VITALS — BP 122/78 | HR 84 | Ht 67.0 in | Wt 179.0 lb

## 2023-04-10 DIAGNOSIS — M25511 Pain in right shoulder: Secondary | ICD-10-CM

## 2023-04-10 DIAGNOSIS — G8929 Other chronic pain: Secondary | ICD-10-CM | POA: Diagnosis not present

## 2023-04-10 DIAGNOSIS — M25512 Pain in left shoulder: Secondary | ICD-10-CM

## 2023-04-10 DIAGNOSIS — M19011 Primary osteoarthritis, right shoulder: Secondary | ICD-10-CM | POA: Diagnosis not present

## 2023-04-10 NOTE — Patient Instructions (Addendum)
Good to see you Can continue tylenol 650 mg 1-2 tablets 2x a day  Continue HEP at least 2-3 times a week  As needed follow up for repeat injection

## 2023-04-24 ENCOUNTER — Ambulatory Visit
Admission: RE | Admit: 2023-04-24 | Discharge: 2023-04-24 | Disposition: A | Discharge status: Home / Self Care | Payer: Medicare Other | Source: Ambulatory Visit | Attending: Family Medicine | Admitting: Family Medicine

## 2023-04-24 DIAGNOSIS — R1030 Lower abdominal pain, unspecified: Secondary | ICD-10-CM

## 2023-04-24 MED ORDER — IOPAMIDOL (ISOVUE-300) INJECTION 61%
100.0000 mL | Freq: Once | INTRAVENOUS | Status: AC | PRN
  Administered 2023-04-24: 100 mL via INTRAVENOUS

## 2023-05-19 ENCOUNTER — Other Ambulatory Visit: Payer: Self-pay | Admitting: Internal Medicine

## 2023-05-30 ENCOUNTER — Telehealth: Payer: Self-pay | Admitting: Internal Medicine

## 2023-05-30 NOTE — Telephone Encounter (Signed)
Patient advising that his Trulicity is out of stock at several locations. Please advise

## 2023-05-30 NOTE — Telephone Encounter (Signed)
LVM for a returned call.  

## 2023-06-02 ENCOUNTER — Telehealth: Payer: Self-pay

## 2023-06-02 ENCOUNTER — Other Ambulatory Visit: Payer: Self-pay

## 2023-06-02 ENCOUNTER — Other Ambulatory Visit (HOSPITAL_COMMUNITY): Payer: Self-pay

## 2023-06-02 DIAGNOSIS — Z794 Long term (current) use of insulin: Secondary | ICD-10-CM

## 2023-06-02 MED ORDER — TRULICITY 3 MG/0.5ML ~~LOC~~ SOAJ
3.0000 mg | SUBCUTANEOUS | 3 refills | Status: DC
  Filled 2023-06-02: qty 2, 28d supply, fill #0

## 2023-06-02 NOTE — Telephone Encounter (Signed)
Spoke to Fred [name] and he is aware medication was sent to [facility] for refills

## 2023-06-05 ENCOUNTER — Telehealth: Payer: Self-pay | Admitting: Internal Medicine

## 2023-06-05 ENCOUNTER — Encounter: Payer: Self-pay | Admitting: Internal Medicine

## 2023-06-05 ENCOUNTER — Ambulatory Visit (INDEPENDENT_AMBULATORY_CARE_PROVIDER_SITE_OTHER): Payer: Medicare Other | Admitting: Internal Medicine

## 2023-06-05 VITALS — BP 120/70 | HR 73 | Ht 67.0 in | Wt 189.0 lb

## 2023-06-05 DIAGNOSIS — E1165 Type 2 diabetes mellitus with hyperglycemia: Secondary | ICD-10-CM | POA: Diagnosis not present

## 2023-06-05 DIAGNOSIS — E1159 Type 2 diabetes mellitus with other circulatory complications: Secondary | ICD-10-CM

## 2023-06-05 DIAGNOSIS — E039 Hypothyroidism, unspecified: Secondary | ICD-10-CM

## 2023-06-05 DIAGNOSIS — Z794 Long term (current) use of insulin: Secondary | ICD-10-CM

## 2023-06-05 DIAGNOSIS — E785 Hyperlipidemia, unspecified: Secondary | ICD-10-CM | POA: Diagnosis not present

## 2023-06-05 LAB — POCT GLYCOSYLATED HEMOGLOBIN (HGB A1C): Hemoglobin A1C: 7.3 % — AB (ref 4.0–5.6)

## 2023-06-05 MED ORDER — LEVOTHYROXINE SODIUM 25 MCG PO TABS: 25.0000 ug | ORAL_TABLET | Freq: Every day | ORAL | 3 refills | Status: DC

## 2023-06-05 MED ORDER — SEMAGLUTIDE (1 MG/DOSE) 4 MG/3ML ~~LOC~~ SOPN: 1.0000 mg | PEN_INJECTOR | SUBCUTANEOUS | 3 refills | Status: DC

## 2023-06-05 MED ORDER — TOUJEO MAX SOLOSTAR 300 UNIT/ML ~~LOC~~ SOPN: 40.0000 [IU] | PEN_INJECTOR | Freq: Every day | SUBCUTANEOUS | 6 refills | Status: DC

## 2023-06-05 MED ORDER — METFORMIN HCL 1000 MG PO TABS: 1000.0000 mg | ORAL_TABLET | Freq: Two times a day (BID) | ORAL | 3 refills | Status: DC

## 2023-06-05 MED ORDER — INSULIN PEN NEEDLE 32G X 4 MM MISC: 1.0000 | Freq: Every day | 3 refills | Status: DC

## 2023-06-05 NOTE — Telephone Encounter (Signed)
Placed in provider box to have signed

## 2023-06-05 NOTE — Progress Notes (Signed)
Name: Fred [name]  Age/ Sex: 82 y.o., male   MRN/ DOB: [id number], February 21, 1941     PCP: Noberto Retort, MD   Reason for Endocrinology Evaluation: Type 2 Diabetes Mellitus  Initial Endocrine Consultative Visit: 03/21/2020    PATIENT IDENTIFIER: Fred [name] is a 82 y.o. male with a past medical history of  HTN, T2DM,CAD and Dyslipidemia. The patient has followed with Endocrinology clinic since 03/21/2020 for consultative assistance with management of his diabetes.  DIABETIC HISTORY:  Fred [name] was diagnosed with DM many years ago, he was initially on oral glycemic agents, but insulin had to be added due to  persistent hyperglycemiaand. His hemoglobin A1c has ranged from 7.8% in 2011, peaking at 9.0% in 2020.  Started  Trulicity 01/2022   Thyroid History :    He was noted to have an elevated TSH at 10.42 uIU/mL . He was started on LT-4 replacement in 02/2020.   SUBJECTIVE:   During the last visit (12/04/2022): A1c 8.5%     Today (06/05/2023): Fred [name] is here for a follow up on diabetes and hypothyroidism.   He checks his blood sugars multiple times daily through  dexcom. The patient has had hypoglycemic episodes since the last clinic visit.He is asymptomatic   Denies nausea, vomiting Has occasional constipation , on miralax     Pt follows  with sports medicine for right shoulder pain     HOME DIABETES REGIMEN:  Farxiga 10 mg daily with Breakfast - through pt assistance  Toujeo 42 units daily  Metformin 1000 mg twice daily Trulicity 3.0 mg weekly ( Sunday)       Statin: Yes ACE-I/ARB: no   CONTINUOUS GLUCOSE MONITORING RECORD INTERPRETATION    Dates of Recording: 6/14-6/27/2024  Sensor description:dexcom  Results statistics:   CGM use % of time 93  Average and SD 145/44  Time in range   79  %  % Time Above 180 18  % Time above 250 2  % Time Below target <1   Glycemic patterns summary: BG's optimal overnight and fluctuate during the  day  Hyperglycemic episodes  postprandial   Hypoglycemic episodes occurred at night Overnight periods: optimal     DIABETIC COMPLICATIONS: Microvascular complications:   Mild tingling of feet Denies: CKD, retinopathy Last eye exam: Completed 2023     Macrovascular complications:  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) Denies: PVD, CVA  HISTORY:  Past Medical History:  Past Medical History:  Diagnosis Date   Coronary artery disease    Diabetes mellitus without complication (HCC)    High cholesterol    Vertigo    Past Surgical History:  Past Surgical History:  Procedure Laterality Date   CARDIAC CATHETERIZATION     CORONARY ARTERY BYPASS GRAFT N/A 10/12/2019   Procedure: CORONARY ARTERY BYPASS GRAFTING (CABG) x 4 (LIMA to LAD, SVG to OM, SVG to RAMUS INTERMEDIATE, SVG to PDA) with EVH from RIGHT GREATER SAPHENOUS VEIN and LEFT INTERNAL MAMMARY ARTERY HARVEST;  Surgeon: Kerin Perna, MD;  Location: [facility] OR;  Service: Open Heart Surgery;  Laterality: N/A;   FOOT SURGERY     KNEE SURGERY     LEFT HEART CATH AND CORONARY ANGIOGRAPHY N/A 10/11/2019   Procedure: LEFT HEART CATH AND CORONARY ANGIOGRAPHY;  Surgeon: Runell Gess, MD;  Location: [facility] INVASIVE CV LAB;  Service: Cardiovascular;  Laterality: N/A;   NASAL SEPTUM SURGERY     TEE WITHOUT CARDIOVERSION N/A 10/12/2019   Procedure: TRANSESOPHAGEAL ECHOCARDIOGRAM (TEE);  Surgeon: [name], Theron Arista, MD;  Location: [facility] OR;  Service: Open Heart Surgery;  Laterality: N/A;   Social History:  reports that he quit smoking about 60 years ago. His smoking use included cigarettes. He has never used smokeless tobacco. He reports that he does not drink alcohol and does not use drugs. Family History: No family history on file.   HOME MEDICATIONS: Allergies as of 06/05/2023   No Known Allergies      Medication List        Accurate as of June 05, 2023  8:28 AM. If you have any questions, ask your nurse or doctor.          Accu-Chek  Aviva device by Other route. Use as instructed 1 time daily   Accu-Chek FastClix Lancets Misc USE 1 TO 4 TIMES DAILY AS  NEEDED/DIRECTED   Accu-Chek Guide test strip Generic drug: glucose blood USE TO CHECK BLOOD SUGAR DAILY  AS DIRECTED   aspirin EC 81 MG tablet Take 1 tablet (81 mg total) by mouth daily.   clotrimazole 1 % cream Commonly known as: LOTRIMIN Apply 1 application  topically as needed (Groin).   dapagliflozin propanediol 10 MG Tabs tablet Commonly known as: Farxiga Take 1 tablet (10 mg total) by mouth daily before breakfast.   Insulin Pen Needle 32G X 4 MM Misc 1 Device by Does not apply route daily.   levothyroxine 25 MCG tablet Commonly known as: SYNTHROID Take 1 tablet (25 mcg total) by mouth daily before breakfast.   metFORMIN 1000 MG tablet Commonly known as: GLUCOPHAGE TAKE 1 TABLET BY MOUTH TWICE  DAILY WITH A MEAL   metoprolol tartrate 25 MG tablet Commonly known as: LOPRESSOR TAKE ONE-HALF TABLET BY MOUTH  TWICE DAILY   multivitamin with minerals tablet Take 1 tablet by mouth daily.   nitroGLYCERIN 0.4 MG SL tablet Commonly known as: NITROSTAT Place 1 tablet (0.4 mg total) under the tongue every 5 (five) minutes as needed for chest pain.   simvastatin 40 MG tablet Commonly known as: ZOCOR Take 40 mg by mouth daily at 6 PM.   solifenacin 5 MG tablet Commonly known as: VESICARE Take 5 mg by mouth daily.   tamsulosin 0.4 MG Caps capsule Commonly known as: FLOMAX tamsulosin 0.4 mg capsule  TK 1 C PO QD   Toujeo Max SoloStar 300 UNIT/ML Solostar Pen Generic drug: insulin glargine (2 Unit Dial) Inject 42 Units into the skin daily.   Trulicity 3 MG/0.5ML Sopn Generic drug: Dulaglutide Inject 3 mg as directed once a week.         OBJECTIVE:   Vital Signs: BP 120/70 (BP Location: Left Arm, Patient Position: Sitting, Cuff Size: Small)   Pulse 73   Ht 5\' 7"  (1.702 m)   Wt 189 lb (85.7 kg)   SpO2 94%   BMI 29.60 kg/m   Wt  Readings from Last 3 Encounters:  06/05/23 189 lb (85.7 kg)  04/10/23 179 lb (81.2 kg)  02/27/23 187 lb (84.8 kg)     Exam: General: Pt appears well and is in NAD  Lungs: Clear with good BS bilat with no rales, rhonchi, or wheezes  Heart: RRR   Extremities: trace pretibial edema.   Neuro: MS is good with appropriate affect, pt is alert and Ox3    DM foot exam: 12/04/2022 The skin of the feet is without sores or ulcerations, plantar callous formation noted The pedal pulses are 2+ on right and 2+ on left. The sensation is  intact  to a screening 5.07, 10 gram monofilament bilaterally   DATA REVIEWED:       Latest Reference Range & Units 12/04/22 10:00  Sodium 135 - 145 mEq/L 138  Potassium 3.5 - 5.1 mEq/L 4.3  Chloride 96 - 112 mEq/L 101  CO2 19 - 32 mEq/L 28  Glucose 70 - 99 mg/dL 098 (H)  BUN 6 - 23 mg/dL 25 (H)  Creatinine 1.19 - 1.50 mg/dL 1.47  Calcium 8.4 - 82.9 mg/dL 9.7  GFR >56.21 mL/min 63.87    Latest Reference Range & Units 12/04/22 10:00  TSH 0.35 - 5.50 uIU/mL 2.93    ASSESSMENT / PLAN / RECOMMENDATIONS:   1) Type 2 Diabetes Mellitus, OPtimally controlled, With Neuropathic and  Macrovascular  complications - Most recent A1c of 7.3 %. Goal A1c < 7.5 %.      -Advised the patient improved glycemic control, A1c has trended down from 8.5% to 7.3% -He was having shortage of supply of Trulicity, I have recommended switching to Ozempic -Patient also endorses increase in the cost of Trulicity, most likely he is in the coverage, patient assistance forms for Ozempic were provided -He is already on patient assistance for Farxiga -I have recommended decreasing Toujeo to prevent hypoglycemia at night  MEDICATIONS: Continue Farxiga 10 mg daily  Decrease Toujeo 40 units daily  Continue Metformin 1000 mg BID  Switch Trulicity 3 mg to Ozempic 1 mg weekly  EDUCATION / INSTRUCTIONS: BG monitoring instructions: Patient is instructed to check his blood sugars 2 times  a day, fasting and bedtime . Call [facility] if: BG persistently < 70  I reviewed the Rule of 15 for the treatment of hypoglycemia in detail with the patient. Literature supplied.   2) Diabetic complications:  Eye: Does not have known diabetic retinopathy.  Neuro/ Feet: Does  have known diabetic peripheral neuropathy .  Renal: Patient does not have known baseline CKD. He   is not on an ACEI/ARB at present. MA/Cr ratio is normal 02/2022)  2) Hypothyroidism:  - TSH at goal in the past - No changes    Medication  Continue levothyroxine 25 mcg daily   3) Peripheral Neuropathy: -Patient has mild symptoms of neuropathy of the feet.  We discussed gabapentin in the past , we discussed risk of drowsiness and lower extremity swelling -We have opted to hold off on this treatment since it is mild and tolerable    4) Dyslipidemia:  -Patient with elevated triglyceride level, part of this is due to nonfasting status -LDL at goal  Medication Continue simvastatin 40 mg daily     F/U in 6 months     Signed electronically by: Lyndle Herrlich, MD  [facility]  [facility] [address] [place] [place], [place] [address] Phone: [phone] FAX: [phone]   CC: Noberto Retort, MD [address] [place] [address] Phone: [phone]  Fax: [phone]  Return to Endocrinology clinic as below: Future Appointments  Date Time Provider Department Center  06/05/2023  8:30 AM Fred [name], Fred Dolores, MD [id number] None

## 2023-06-05 NOTE — Patient Instructions (Addendum)
-   Decrease Toujeo 40 units daily  - Will switch Trulicity to Ozempic 1 mg weekly - Continue Farxiga 10 mg daily with Breakfast  - Continue Metformin 1000 mg twice daily    HOW TO TREAT LOW BLOOD SUGARS (Blood sugar LESS THAN 70 MG/DL) Please follow the RULE OF 15 for the treatment of hypoglycemia treatment (when your (blood sugars are less than 70 mg/dL)   STEP 1: Take 15 grams of carbohydrates when your blood sugar is low, which includes:  3-4 GLUCOSE TABS  OR 3-4 OZ OF JUICE OR REGULAR SODA OR ONE TUBE OF GLUCOSE GEL    STEP 2: RECHECK blood sugar in 15 MINUTES STEP 3: If your blood sugar is still low at the 15 minute recheck --> then, go back to STEP 1 and treat AGAIN with another 15 grams of carbohydrates. -

## 2023-06-05 NOTE — Telephone Encounter (Signed)
Patient completed and dropped off [employer] Patient Assistance Paper Work . Placed in provider folder at the front desk

## 2023-06-06 NOTE — Telephone Encounter (Signed)
[employer] application has been faxed

## 2023-06-11 ENCOUNTER — Telehealth: Payer: Self-pay

## 2023-06-11 NOTE — Telephone Encounter (Signed)
Received a fax from [employer] stating that they can not process his application at this time because they need his income. I called and spoke with the patient and he states that he brought it but I was unable to locate the original application. Can you assist with this when you return. Patient is aware that we are working on this when you return.

## 2023-06-16 NOTE — Telephone Encounter (Signed)
Banking statement that was with application has been refaxed

## 2023-06-16 NOTE — Telephone Encounter (Signed)
Mr Gillman is calling back asking if his application was located he states he left a copy of income and insurance card with his application when he dropped it off

## 2023-06-26 ENCOUNTER — Other Ambulatory Visit: Payer: Self-pay

## 2023-06-26 MED ORDER — DAPAGLIFLOZIN PROPANEDIOL 10 MG PO TABS: 10.0000 mg | ORAL_TABLET | Freq: Every day | ORAL | 3 refills | Status: DC

## 2023-06-26 NOTE — Telephone Encounter (Signed)
Farxiga sent electronically to [employer]  fro [facility]

## 2023-07-02 ENCOUNTER — Telehealth: Payer: Self-pay

## 2023-07-02 NOTE — Telephone Encounter (Signed)
Called and LVM for Fred [name] that his Ozempic has been delivered to our office and is ready to be picked up

## 2023-07-04 NOTE — Telephone Encounter (Signed)
Patient came in to office today and picked 4 boxes of patient assistance Ozempic.

## 2023-08-19 ENCOUNTER — Telehealth: Payer: Self-pay | Admitting: Cardiovascular Disease

## 2023-08-19 NOTE — Telephone Encounter (Signed)
Call to patient. Patient states he has been more tired for few days to weeks.  When active gets tired quicker and "feels a little something in my chest", not pain. States he feels "winded". State it may be something like what he had before surgery. He has no current chest pain and no SOB.  Set to see PA Lisabeth Devoid on Friday 9/13

## 2023-08-19 NOTE — Telephone Encounter (Signed)
Patient states that he has been feeling more tired lately and would like to talk to someone in regards to this and to see if this is something he would need to come in sooner to be seen about. Please advise.

## 2023-08-22 ENCOUNTER — Ambulatory Visit: Payer: Medicare Other | Attending: Physician Assistant | Admitting: Physician Assistant

## 2023-08-22 ENCOUNTER — Encounter: Payer: Self-pay | Admitting: Physician Assistant

## 2023-08-22 VITALS — BP 102/52 | HR 81 | Ht 67.0 in | Wt 186.6 lb

## 2023-08-22 DIAGNOSIS — R5383 Other fatigue: Secondary | ICD-10-CM

## 2023-08-22 DIAGNOSIS — E119 Type 2 diabetes mellitus without complications: Secondary | ICD-10-CM

## 2023-08-22 DIAGNOSIS — I25709 Atherosclerosis of coronary artery bypass graft(s), unspecified, with unspecified angina pectoris: Secondary | ICD-10-CM

## 2023-08-22 DIAGNOSIS — R0789 Other chest pain: Secondary | ICD-10-CM | POA: Diagnosis not present

## 2023-08-22 DIAGNOSIS — E782 Mixed hyperlipidemia: Secondary | ICD-10-CM | POA: Diagnosis not present

## 2023-08-22 DIAGNOSIS — Z794 Long term (current) use of insulin: Secondary | ICD-10-CM

## 2023-08-22 NOTE — Patient Instructions (Addendum)
Medication Instructions:  NO CHANGES *If you need a refill on your cardiac medications before your next appointment, please call your pharmacy*   Lab Work: BMP,TSH AND CBC TODAY. If you have labs (blood work) drawn today and your tests are completely normal, you will receive your results only by: MyChart Message (if you have MyChart) OR A paper copy in the mail If you have any lab test that is abnormal or we need to change your treatment, we will call you to review the results.   Testing/Procedures:[address] Your physician has requested that you have an echocardiogram. Echocardiography is a painless test that uses sound waves to create images of your heart. It provides your doctor with information about the size and shape of your heart and how well your heart's chambers and valves are working. This procedure takes approximately one hour. There are no restrictions for this procedure. Please do NOT wear cologne, perfume, aftershave, or lotions (deodorant is allowed). Please arrive 15 minutes prior to your appointment time.    CARDIAC PET- Your physician has requested that you have a Cardiac Pet Stress Test.   This testing is completed at [facility] ([address] [place], [place] [address]). Please arrive 30 minutes prior to your scheduled time.  The schedulers will call you to get this scheduled. Please follow further testing instructions below.    Follow-Up: At [facility], you and your health needs are our priority.  As part of our continuing mission to provide you with exceptional heart care, we have created designated Provider Care Teams.  These Care Teams include your primary Cardiologist (physician) and Advanced Practice Providers (APPs -  Physician Assistants and Nurse Practitioners) who all work together to provide you with the care you need, when you need it.   Your next appointment:   3-4 week(s)  Provider:   Azalee Course,  PA  Other Instructions   How to Prepare for Your Cardiac PET/CT Stress Test:  1. Please do not take these medications before your test:   Medications that may interfere with the cardiac pharmacological stress agent (ex. nitrates - including erectile dysfunction medications, isosorbide mononitrate, tamulosin or beta-blockers) the day of the exam. (Erectile dysfunction medication should be held for at least 72 hrs prior to test) Theophylline containing medications for 12 hours. Dipyridamole 48 hours prior to the test. Your remaining medications may be taken with water.  2. Nothing to eat or drink, except water, 3 hours prior to arrival time.   NO caffeine/decaffeinated products, or chocolate 12 hours prior to arrival.  3. NO perfume, cologne or lotion on chest or abdomen area.          - FEMALES - Please avoid wearing dresses to this appointment.  4. Total time is 1 to 2 hours; you may want to bring reading material for the waiting time.  5. Please report to Radiology at the [facility] Main Entrance 30 minutes early for your test.  [address] [place], [place] [address]  Diabetic Preparation:  Hold oral medications. You may take NPH and Lantus insulin. Do not take Humalog or Humulin R (Regular Insulin) the day of your test. Check blood sugars prior to leaving the house. If able to eat breakfast prior to 3 hour fasting, you may take all medications, including your insulin, Do not worry if you miss your breakfast dose of insulin - start at your next meal. Patients who wear a continuous glucose  monitor MUST remove the device prior to scanning.  In preparation for your appointment, medication and supplies will be purchased.  Appointment availability is limited, so if you need to cancel or reschedule, please call the Radiology Department at [phone] [facility])  24 hours in advance to avoid a cancellation fee of $100.00  What to Expect After you Arrive:  Once you  arrive and check in for your appointment, you will be taken to a preparation room within the Radiology Department.  A technologist or Nurse will obtain your medical history, verify that you are correctly prepped for the exam, and explain the procedure.  Afterwards,  an IV will be started in your arm and electrodes will be placed on your skin for EKG monitoring during the stress portion of the exam. Then you will be escorted to the PET/CT scanner.  There, staff will get you positioned on the scanner and obtain a blood pressure and EKG.  During the exam, you will continue to be connected to the EKG and blood pressure machines.  A small, safe amount of a radioactive tracer will be injected in your IV to obtain a series of pictures of your heart along with an injection of a stress agent.    After your Exam:  It is recommended that you eat a meal and drink a caffeinated beverage to counter act any effects of the stress agent.  Drink plenty of fluids for the remainder of the day and urinate frequently for the first couple of hours after the exam.  Your doctor will inform you of your test results within 7-10 business days.  For more information and frequently asked questions, please visit our website : [email or web address]  For questions about your test or how to prepare for your test, please call: Cardiac Imaging Nurse Navigators Office: [phone]

## 2023-08-22 NOTE — Progress Notes (Signed)
Cardiology Office Note:  .   Date:  08/22/2023  ID:  [name], DOB Jun 03, 1941, MRN [id number] PCP: Noberto Retort, MD  [facility] Providers Cardiologist:  Nanetta Batty, MD     History of Present Illness: .   Fred [name] is a 82 y.o. male with past medical history of dizziness, CAD, hyperlipidemia and DM II.  He has family history of heart disease with mother who had a CABG and a younger brother who died.  Previous heart monitor obtained on 10/12/2019 showed sinus rhythm, occasional PAC and PVCs, occasional run of PSVT.  Coronary CTA obtained in October 2020 demonstrated three-vessel CAD.  Echocardiogram obtained on 10/06/2019 showed EF 55 to 60%, grade 1 DD no significant valve issue.  Subsequent cardiac catheterization performed on 10/11/2019 confirmed the left main and three-vessel disease.  He underwent 4 vessel CABG by Dr. [name] with LIMA to LAD, SVG to ramus, OM and PDA.  He was last seen by Dr. Gery Pray in January 2024 at which time he denied any discomfort.  1 year follow-up was recommended.  Patient presents to me for evaluation of vague chest discomfort.  He had an episode several months ago where he was extremely weak and had to sit on the floor at [employer].  He denies any crushing chest pain.  In the past few weeks, he has been having vague chest discomfort more noticeable when he exerted himself.  Location is similar to the previous angina, however symptom is quite mild.  EKG today however shows T wave inversion in the septal lead and also with new Q waves in the inferior lead.  I recommend a basic metabolic panel, TSH, CBC, echocardiogram and PET stress test.  We will see the patient back in 3 to 4 weeks.  ROS:   He describes fatigue and chest discomfort, he denies any shortness of breath, dizziness, orthopnea or PND.  Studies Reviewed: .        Cardiac Studies & Procedures   CARDIAC CATHETERIZATION  CARDIAC CATHETERIZATION  10/11/2019  Narrative Images from the original result were not included.   Mid LM to Dist LM lesion is 80% stenosed.  Ost LAD to Prox LAD lesion is 90% stenosed.  Ost Cx to Prox Cx lesion is 90% stenosed.  Prox RCA to Mid RCA lesion is 90% stenosed.  Fred [name] is a 82 y.o. male   [id number] LOCATION:  FACILITY: [facility] PHYSICIAN: Nanetta Batty, M.D. 1941-03-26   DATE OF PROCEDURE:  10/11/2019  DATE OF DISCHARGE:     CARDIAC CATHETERIZATION    History obtained from chart review.Fred [name] is a 82 y.o.  mildly overweight weight married Caucasian male father of 2, grandfather of 4 grandchildren who is referred by the ER and Dr. Tiburcio Pea for evaluation of dizziness.  He retired in 2000 from working at [employer] in [place].  I last saw him in the office 09/07/2019. His cardiac risk factors include treated diabetes and hyperlipidemia.  He does not smoke.  He does have a family history of heart disease with a mother that had CABG and a younger brother who died as well which was found in the evaluation of syncope.  He is never had a heart attack or stroke.  He denies chest pain or shortness of breath.  He is fairly active and routinely walked 3 miles a day up until COVID-19 when he became more sedentary however recently he is try to increase his  Cardiology Office Note:  .   Date:  08/22/2023  ID:  [name], DOB Jun 03, 1941, MRN [id number] PCP: Noberto Retort, MD  [facility] Providers Cardiologist:  Nanetta Batty, MD     History of Present Illness: .   Fred [name] is a 82 y.o. male with past medical history of dizziness, CAD, hyperlipidemia and DM II.  He has family history of heart disease with mother who had a CABG and a younger brother who died.  Previous heart monitor obtained on 10/12/2019 showed sinus rhythm, occasional PAC and PVCs, occasional run of PSVT.  Coronary CTA obtained in October 2020 demonstrated three-vessel CAD.  Echocardiogram obtained on 10/06/2019 showed EF 55 to 60%, grade 1 DD no significant valve issue.  Subsequent cardiac catheterization performed on 10/11/2019 confirmed the left main and three-vessel disease.  He underwent 4 vessel CABG by Dr. [name] with LIMA to LAD, SVG to ramus, OM and PDA.  He was last seen by Dr. Gery Pray in January 2024 at which time he denied any discomfort.  1 year follow-up was recommended.  Patient presents to me for evaluation of vague chest discomfort.  He had an episode several months ago where he was extremely weak and had to sit on the floor at [employer].  He denies any crushing chest pain.  In the past few weeks, he has been having vague chest discomfort more noticeable when he exerted himself.  Location is similar to the previous angina, however symptom is quite mild.  EKG today however shows T wave inversion in the septal lead and also with new Q waves in the inferior lead.  I recommend a basic metabolic panel, TSH, CBC, echocardiogram and PET stress test.  We will see the patient back in 3 to 4 weeks.  ROS:   He describes fatigue and chest discomfort, he denies any shortness of breath, dizziness, orthopnea or PND.  Studies Reviewed: .        Cardiac Studies & Procedures   CARDIAC CATHETERIZATION  CARDIAC CATHETERIZATION  10/11/2019  Narrative Images from the original result were not included.   Mid LM to Dist LM lesion is 80% stenosed.  Ost LAD to Prox LAD lesion is 90% stenosed.  Ost Cx to Prox Cx lesion is 90% stenosed.  Prox RCA to Mid RCA lesion is 90% stenosed.  Fred [name] is a 82 y.o. male   [id number] LOCATION:  FACILITY: [facility] PHYSICIAN: Nanetta Batty, M.D. 1941-03-26   DATE OF PROCEDURE:  10/11/2019  DATE OF DISCHARGE:     CARDIAC CATHETERIZATION    History obtained from chart review.Fred [name] is a 82 y.o.  mildly overweight weight married Caucasian male father of 2, grandfather of 4 grandchildren who is referred by the ER and Dr. Tiburcio Pea for evaluation of dizziness.  He retired in 2000 from working at [employer] in [place].  I last saw him in the office 09/07/2019. His cardiac risk factors include treated diabetes and hyperlipidemia.  He does not smoke.  He does have a family history of heart disease with a mother that had CABG and a younger brother who died as well which was found in the evaluation of syncope.  He is never had a heart attack or stroke.  He denies chest pain or shortness of breath.  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 No atrial level shunt detected by color flow Doppler.    LEFT VENTRICLE PLAX 2D LVIDd:         4.40 cm  Diastology LVIDs:         2.90 cm  LV e' lateral:   6.20 cm/s LV PW:         1.20 cm  LV E/e' lateral: 10.4 LV IVS:        1.20 cm  LV e' medial:    4.90 cm/s LVOT diam:     2.15 cm  LV E/e' medial:  13.1 LV SV:         55 ml LV SV Index:   25.90 LVOT Area:     3.63 cm   RIGHT VENTRICLE RV Basal diam:  2.90 cm RV S prime:     14.90 cm/s TAPSE (M-mode): 2.8 cm RVSP:           22.9 mmHg  LEFT ATRIUM             Index       RIGHT ATRIUM           Index LA diam:        3.10 cm 1.51 cm/m  RA Pressure: 3.00 mmHg LA Vol (A2C):   53.5 ml 26.04 ml/m RA Area:     13.00 cm LA Vol (A4C):   37.6 ml 18.30 ml/m RA Volume:   28.80 ml  14.02 ml/m LA Biplane Vol: 45.3 ml 22.05 ml/m AORTIC VALVE LVOT Vmax:   103.00 cm/s LVOT Vmean:  62.900 cm/s LVOT VTI:    0.225 m AI PHT:      442 msec  AORTA Ao Root diam: 3.10 cm  MITRAL VALVE                         TRICUSPID VALVE MV Area (PHT): 3.37 cm              TR Peak grad:   19.9 mmHg MV PHT:        65.25 msec            TR Vmax:        223.00 cm/s MV Decel Time: 225 msec              Estimated RAP:  3.00 mmHg MV E velocity: 64.30 cm/s  103 cm/s  RVSP:           22.9 mmHg MV A velocity: 101.00 cm/s 70.3 cm/s MV E/A ratio:  0.64        1.5        SHUNTS Systemic VTI:  0.22 m Systemic Diam: 2.15 cm   Lennie Odor MD Electronically signed by Lennie Odor MD Signature Date/Time: 10/06/2019/7:38:18 PM    Final   TEE  ECHO INTRAOPERATIVE TEE 10/12/2019  Narrative *INTRAOPERATIVE TRANSESOPHAGEAL REPORT *    Patient Name:   Fred [name] Date of Exam: 10/12/2019 Medical Rec #:  [id number]       Height:       67.0 in Accession #:    [id number]      Weight:       197.2 lb Date of Birth:  [birth year]       BSA:          2.01 m Patient Age:    78 years        BP:  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 No atrial level shunt detected by color flow Doppler.    LEFT VENTRICLE PLAX 2D LVIDd:         4.40 cm  Diastology LVIDs:         2.90 cm  LV e' lateral:   6.20 cm/s LV PW:         1.20 cm  LV E/e' lateral: 10.4 LV IVS:        1.20 cm  LV e' medial:    4.90 cm/s LVOT diam:     2.15 cm  LV E/e' medial:  13.1 LV SV:         55 ml LV SV Index:   25.90 LVOT Area:     3.63 cm   RIGHT VENTRICLE RV Basal diam:  2.90 cm RV S prime:     14.90 cm/s TAPSE (M-mode): 2.8 cm RVSP:           22.9 mmHg  LEFT ATRIUM             Index       RIGHT ATRIUM           Index LA diam:        3.10 cm 1.51 cm/m  RA Pressure: 3.00 mmHg LA Vol (A2C):   53.5 ml 26.04 ml/m RA Area:     13.00 cm LA Vol (A4C):   37.6 ml 18.30 ml/m RA Volume:   28.80 ml  14.02 ml/m LA Biplane Vol: 45.3 ml 22.05 ml/m AORTIC VALVE LVOT Vmax:   103.00 cm/s LVOT Vmean:  62.900 cm/s LVOT VTI:    0.225 m AI PHT:      442 msec  AORTA Ao Root diam: 3.10 cm  MITRAL VALVE                         TRICUSPID VALVE MV Area (PHT): 3.37 cm              TR Peak grad:   19.9 mmHg MV PHT:        65.25 msec            TR Vmax:        223.00 cm/s MV Decel Time: 225 msec              Estimated RAP:  3.00 mmHg MV E velocity: 64.30 cm/s  103 cm/s  RVSP:           22.9 mmHg MV A velocity: 101.00 cm/s 70.3 cm/s MV E/A ratio:  0.64        1.5        SHUNTS Systemic VTI:  0.22 m Systemic Diam: 2.15 cm   Lennie Odor MD Electronically signed by Lennie Odor MD Signature Date/Time: 10/06/2019/7:38:18 PM    Final   TEE  ECHO INTRAOPERATIVE TEE 10/12/2019  Narrative *INTRAOPERATIVE TRANSESOPHAGEAL REPORT *    Patient Name:   Fred [name] Date of Exam: 10/12/2019 Medical Rec #:  [id number]       Height:       67.0 in Accession #:    [id number]      Weight:       197.2 lb Date of Birth:  [birth year]       BSA:          2.01 m Patient Age:    78 years        BP:  activity.  On 08/30/2019 he went out for his morning run of 3 miles and at the end he felt weak.  When driving back home he thought that he may have passed out but he was dizzy.  He went to the ER where he was nauseated as well.  ER evaluation was unrevealing.  He said he felt a little bit of chest discomfort this morning when coming to the office but otherwise specifically denied the symptoms.  I performed event monitoring which is still processing.  A coronary CTA did show three-vessel disease.  Based on this, I recommended that we proceed with outpatient diagnostic coronary angiography.  Impression Fred [name]  has left main/three-vessel disease with preserved LV function by 2D echo.  He has surgical anatomy.  Given the severe nature of his blockage I favor keeping him for CABG.  T CTS has been consulted.  The sheath was removed and a TR band was placed on the right wrist to achieve patent hemostasis.  The patient left the lab in stable condition.  Nanetta Batty. MD, [facility] 10/11/2019 11:53 AM  Findings Coronary Findings Diagnostic  Dominance: Right  Left Main Mid LM to Dist LM lesion is 80% stenosed. The lesion is moderately calcified.  Left Anterior Descending Ost LAD to Prox LAD lesion is 90% stenosed. The lesion is calcified.  Left Circumflex Ost Cx to Prox Cx lesion is 90% stenosed.  Right Coronary Artery Prox RCA to Mid RCA lesion is 90% stenosed. The lesion is calcified.  Intervention  No interventions have been documented.   STRESS TESTS  NM MYOCAR MULTI W/SPECT W 10/26/2010  Narrative Test indication:  The patient a 40-year-old with history of syncope. Test to evaluate, rule out ischemia.  Lexiscan stress testing:  The patient underwent Lexiscan stress stress testing per protocol.  He experienced no chest pain EKG showed no ST changes to suggest ischemia.  Nuclear data:  The patient was studied in 1-day rest stress protocol.  He was injected with 10 mCi technetium 99 labeled tetrofosmin at rest, 30 mCi technetium 99 labeled tetrofosmin at stress.  Images were reconstructed in the short vertical and horizontal axes  In the initial stress images there was normal perfusion with mild apical thinning.  In the recovery images there is no significant change.  On gating LVEF was calculated at 54%.  Impression:  Lexiscan Myoview:  Electrically negative for ischemia. Myoview scan with  normal perfusion and mild apical thinning.  LVEF calculated at 54%.  No ischemia.  Provider: Arminda Resides, Hannah Beat, Amber [place], Shanin [place] [facility]    ECHOCARDIOGRAM  ECHOCARDIOGRAM COMPLETE 10/06/2019  Narrative ECHOCARDIOGRAM REPORT    Patient Name:   Fred [name] Date of Exam: 10/06/2019 Medical Rec #:  [id number]       Height:       67.0 in Accession #:    [id number]      Weight:       207.6 lb Date of Birth:  March 11, 1941       BSA:          2.05 m Patient Age:    78 years        BP:           137/73 mmHg Patient Gender: M               HR:           75 bpm. Exam Location:  [address]  Procedure: 2D Echo, Cardiac Doppler and Color  Cardiology Office Note:  .   Date:  08/22/2023  ID:  [name], DOB Jun 03, 1941, MRN [id number] PCP: Noberto Retort, MD  [facility] Providers Cardiologist:  Nanetta Batty, MD     History of Present Illness: .   Fred [name] is a 82 y.o. male with past medical history of dizziness, CAD, hyperlipidemia and DM II.  He has family history of heart disease with mother who had a CABG and a younger brother who died.  Previous heart monitor obtained on 10/12/2019 showed sinus rhythm, occasional PAC and PVCs, occasional run of PSVT.  Coronary CTA obtained in October 2020 demonstrated three-vessel CAD.  Echocardiogram obtained on 10/06/2019 showed EF 55 to 60%, grade 1 DD no significant valve issue.  Subsequent cardiac catheterization performed on 10/11/2019 confirmed the left main and three-vessel disease.  He underwent 4 vessel CABG by Dr. [name] with LIMA to LAD, SVG to ramus, OM and PDA.  He was last seen by Dr. Gery Pray in January 2024 at which time he denied any discomfort.  1 year follow-up was recommended.  Patient presents to me for evaluation of vague chest discomfort.  He had an episode several months ago where he was extremely weak and had to sit on the floor at [employer].  He denies any crushing chest pain.  In the past few weeks, he has been having vague chest discomfort more noticeable when he exerted himself.  Location is similar to the previous angina, however symptom is quite mild.  EKG today however shows T wave inversion in the septal lead and also with new Q waves in the inferior lead.  I recommend a basic metabolic panel, TSH, CBC, echocardiogram and PET stress test.  We will see the patient back in 3 to 4 weeks.  ROS:   He describes fatigue and chest discomfort, he denies any shortness of breath, dizziness, orthopnea or PND.  Studies Reviewed: .        Cardiac Studies & Procedures   CARDIAC CATHETERIZATION  CARDIAC CATHETERIZATION  10/11/2019  Narrative Images from the original result were not included.   Mid LM to Dist LM lesion is 80% stenosed.  Ost LAD to Prox LAD lesion is 90% stenosed.  Ost Cx to Prox Cx lesion is 90% stenosed.  Prox RCA to Mid RCA lesion is 90% stenosed.  Fred [name] is a 82 y.o. male   [id number] LOCATION:  FACILITY: [facility] PHYSICIAN: Nanetta Batty, M.D. 1941-03-26   DATE OF PROCEDURE:  10/11/2019  DATE OF DISCHARGE:     CARDIAC CATHETERIZATION    History obtained from chart review.Fred [name] is a 82 y.o.  mildly overweight weight married Caucasian male father of 2, grandfather of 4 grandchildren who is referred by the ER and Dr. Tiburcio Pea for evaluation of dizziness.  He retired in 2000 from working at [employer] in [place].  I last saw him in the office 09/07/2019. His cardiac risk factors include treated diabetes and hyperlipidemia.  He does not smoke.  He does have a family history of heart disease with a mother that had CABG and a younger brother who died as well which was found in the evaluation of syncope.  He is never had a heart attack or stroke.  He denies chest pain or shortness of breath.  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 No atrial level shunt detected by color flow Doppler.    LEFT VENTRICLE PLAX 2D LVIDd:         4.40 cm  Diastology LVIDs:         2.90 cm  LV e' lateral:   6.20 cm/s LV PW:         1.20 cm  LV E/e' lateral: 10.4 LV IVS:        1.20 cm  LV e' medial:    4.90 cm/s LVOT diam:     2.15 cm  LV E/e' medial:  13.1 LV SV:         55 ml LV SV Index:   25.90 LVOT Area:     3.63 cm   RIGHT VENTRICLE RV Basal diam:  2.90 cm RV S prime:     14.90 cm/s TAPSE (M-mode): 2.8 cm RVSP:           22.9 mmHg  LEFT ATRIUM             Index       RIGHT ATRIUM           Index LA diam:        3.10 cm 1.51 cm/m  RA Pressure: 3.00 mmHg LA Vol (A2C):   53.5 ml 26.04 ml/m RA Area:     13.00 cm LA Vol (A4C):   37.6 ml 18.30 ml/m RA Volume:   28.80 ml  14.02 ml/m LA Biplane Vol: 45.3 ml 22.05 ml/m AORTIC VALVE LVOT Vmax:   103.00 cm/s LVOT Vmean:  62.900 cm/s LVOT VTI:    0.225 m AI PHT:      442 msec  AORTA Ao Root diam: 3.10 cm  MITRAL VALVE                         TRICUSPID VALVE MV Area (PHT): 3.37 cm              TR Peak grad:   19.9 mmHg MV PHT:        65.25 msec            TR Vmax:        223.00 cm/s MV Decel Time: 225 msec              Estimated RAP:  3.00 mmHg MV E velocity: 64.30 cm/s  103 cm/s  RVSP:           22.9 mmHg MV A velocity: 101.00 cm/s 70.3 cm/s MV E/A ratio:  0.64        1.5        SHUNTS Systemic VTI:  0.22 m Systemic Diam: 2.15 cm   Lennie Odor MD Electronically signed by Lennie Odor MD Signature Date/Time: 10/06/2019/7:38:18 PM    Final   TEE  ECHO INTRAOPERATIVE TEE 10/12/2019  Narrative *INTRAOPERATIVE TRANSESOPHAGEAL REPORT *    Patient Name:   Fred [name] Date of Exam: 10/12/2019 Medical Rec #:  [id number]       Height:       67.0 in Accession #:    [id number]      Weight:       197.2 lb Date of Birth:  [birth year]       BSA:          2.01 m Patient Age:    78 years        BP:

## 2023-08-23 LAB — BASIC METABOLIC PANEL
BUN/Creatinine Ratio: 19 (ref 10–24)
BUN: 20 mg/dL (ref 8–27)
CO2: 23 mmol/L (ref 20–29)
Calcium: 9.5 mg/dL (ref 8.6–10.2)
Chloride: 100 mmol/L (ref 96–106)
Creatinine, Ser: 1.03 mg/dL (ref 0.76–1.27)
Glucose: 168 mg/dL — ABNORMAL HIGH (ref 70–99)
Potassium: 5.2 mmol/L (ref 3.5–5.2)
Sodium: 138 mmol/L (ref 134–144)
eGFR: 72 mL/min/{1.73_m2} (ref >59)

## 2023-08-23 LAB — CBC
Hematocrit: 42.4 % (ref 37.5–51.0)
Hemoglobin: 14.5 g/dL (ref 13.0–17.7)
MCH: 32.9 pg (ref 26.6–33.0)
MCHC: 34.2 g/dL (ref 31.5–35.7)
MCV: 96 fL (ref 79–97)
Platelets: 263 10*3/uL (ref 150–450)
RBC: 4.41 x10E6/uL (ref 4.14–5.80)
RDW: 12.6 % (ref 11.6–15.4)
WBC: 8 10*3/uL (ref 3.4–10.8)

## 2023-08-23 LAB — TSH: TSH: 2.27 u[IU]/mL (ref 0.450–4.500)

## 2023-08-26 ENCOUNTER — Ambulatory Visit (HOSPITAL_COMMUNITY): Payer: Medicare Other | Attending: Physician Assistant

## 2023-08-26 DIAGNOSIS — R0789 Other chest pain: Secondary | ICD-10-CM | POA: Insufficient documentation

## 2023-08-26 LAB — ECHOCARDIOGRAM COMPLETE
Area-P 1/2: 2.87 cm2
P 1/2 time: 387 ms
S' Lateral: 3.1 cm

## 2023-08-27 ENCOUNTER — Telehealth: Payer: Self-pay

## 2023-08-27 NOTE — Telephone Encounter (Addendum)
Called patient regarding results, left vm for patient to call office.  ----- Message from Azalee Course sent at 08/25/2023  2:20 PM EDT ----- Stable renal function and electrolyte. Normal thyroid level. Normal red blood cell count.

## 2023-08-28 ENCOUNTER — Telehealth: Payer: Self-pay | Admitting: Cardiovascular Disease

## 2023-08-28 NOTE — Telephone Encounter (Signed)
Patient aware of lab results. Verbalized understanding.

## 2023-08-28 NOTE — Telephone Encounter (Signed)
Duplicate encounter. Please see previous encounter.

## 2023-08-28 NOTE — Telephone Encounter (Signed)
Patient returned staff call regarding results.

## 2023-09-04 ENCOUNTER — Telehealth: Payer: Self-pay | Admitting: Internal Medicine

## 2023-09-04 NOTE — Telephone Encounter (Signed)
Patient dropped of patient assistance paperwork for [employer] with updated financials attached. Placed in providers box at the front desk

## 2023-09-05 ENCOUNTER — Telehealth (HOSPITAL_COMMUNITY): Payer: Self-pay | Admitting: *Deleted

## 2023-09-05 NOTE — Telephone Encounter (Signed)
Reaching out to patient to offer assistance regarding upcoming cardiac imaging study; pt verbalizes understanding of appt date/time, parking situation and where to check in, pre-test NPO status and medications ordered, and verified current allergies; name and call back number provided for further questions should they arise Johney Frame RN Navigator Cardiac Imaging [facility] [phone] office [phone] cell  Patient aware to hold flomax and avoid caffeine 12 hours prior.

## 2023-09-09 ENCOUNTER — Encounter (HOSPITAL_COMMUNITY)
Admission: RE | Admit: 2023-09-09 | Discharge: 2023-09-09 | Disposition: A | Discharge status: Home / Self Care | Payer: Medicare Other | Source: Ambulatory Visit | Attending: Physician Assistant | Admitting: Physician Assistant

## 2023-09-09 DIAGNOSIS — R0789 Other chest pain: Secondary | ICD-10-CM | POA: Diagnosis not present

## 2023-09-09 LAB — NM PET CT CARDIAC PERFUSION MULTI W/ABSOLUTE BLOODFLOW
LV dias vol: 89 mL (ref 62–150)
LV sys vol: 50 mL
Nuc Rest EF: 44 %
Nuc Stress EF: 50 %
Rest Nuclear Isotope Dose: 21.7 mCi
ST Depression (mm): 0 mm
Stress Nuclear Isotope Dose: 22.1 mCi

## 2023-09-09 MED ORDER — REGADENOSON 0.4 MG/5ML IV SOLN
0.4000 mg | Freq: Once | INTRAVENOUS | Status: AC
  Administered 2023-09-09: 0.4 mg via INTRAVENOUS

## 2023-09-09 MED ORDER — RUBIDIUM RB82 GENERATOR (RUBYFILL)
21.7200 | PACK | Freq: Once | INTRAVENOUS | Status: AC
  Administered 2023-09-09: 21.72 via INTRAVENOUS

## 2023-09-09 MED ORDER — RUBIDIUM RB82 GENERATOR (RUBYFILL)
22.1000 | PACK | Freq: Once | INTRAVENOUS | Status: AC
  Administered 2023-09-09: 22.1 via INTRAVENOUS

## 2023-09-09 MED ORDER — REGADENOSON 0.4 MG/5ML IV SOLN
INTRAVENOUS | Status: AC
  Filled 2023-09-09: qty 5

## 2023-09-10 ENCOUNTER — Other Ambulatory Visit: Payer: Self-pay

## 2023-09-10 DIAGNOSIS — E1165 Type 2 diabetes mellitus with hyperglycemia: Secondary | ICD-10-CM

## 2023-09-10 MED ORDER — LEVOTHYROXINE SODIUM 25 MCG PO TABS: 25.0000 ug | ORAL_TABLET | Freq: Every day | ORAL | 3 refills | Status: DC

## 2023-09-11 ENCOUNTER — Telehealth: Payer: Self-pay

## 2023-09-11 ENCOUNTER — Other Ambulatory Visit: Payer: Self-pay

## 2023-09-11 NOTE — Telephone Encounter (Addendum)
Called patient in regards to provider wanting patient to come in for earlier office visit to discuss stress test, patient had understanding and said he would be willing to come in Friday 09/12/23, appointment scheduled.  ----- Message from Azalee Course sent at 09/09/2023  4:53 PM EDT ----- Need earlier visit to discuss this stress test, ok to take this Friday's PV slot.

## 2023-09-12 ENCOUNTER — Ambulatory Visit: Payer: Medicare Other | Attending: Physician Assistant | Admitting: Physician Assistant

## 2023-09-12 VITALS — BP 102/50 | HR 79 | Ht 67.0 in | Wt 186.2 lb

## 2023-09-12 DIAGNOSIS — E785 Hyperlipidemia, unspecified: Secondary | ICD-10-CM

## 2023-09-12 DIAGNOSIS — Z794 Long term (current) use of insulin: Secondary | ICD-10-CM

## 2023-09-12 DIAGNOSIS — E119 Type 2 diabetes mellitus without complications: Secondary | ICD-10-CM

## 2023-09-12 DIAGNOSIS — R9439 Abnormal result of other cardiovascular function study: Secondary | ICD-10-CM | POA: Diagnosis not present

## 2023-09-12 DIAGNOSIS — I25709 Atherosclerosis of coronary artery bypass graft(s), unspecified, with unspecified angina pectoris: Secondary | ICD-10-CM | POA: Diagnosis not present

## 2023-09-12 MED ORDER — NITROGLYCERIN 0.4 MG SL SUBL: 0.4000 mg | SUBLINGUAL_TABLET | SUBLINGUAL | 3 refills | Status: AC | PRN

## 2023-09-12 NOTE — Patient Instructions (Addendum)
Medication Instructions:  NO CHANGES *If you need a refill on your cardiac medications before your next appointment, please call your pharmacy*   Lab Work: NO LABS If you have labs (blood work) drawn today and your tests are completely normal, you will receive your results only by: MyChart Message (if you have MyChart) OR A paper copy in the mail If you have any lab test that is abnormal or we need to change your treatment, we will call you to review the results.   Testing/Procedures:       Cardiac/Peripheral Catheterization   You are scheduled for a Cardiac Catheterization on Thursday, October 10 with Dr.  Truett Mainland, MD .  1. Please arrive at the [facility] (Main Entrance A) at [facility]: [address] [place], [place] [address] at 5:30 AM (This time is 2 hour(s) before your procedure to ensure your preparation). Free valet parking service is available. You will check in at ADMITTING. The support person will be asked to wait in the waiting room.  It is OK to have someone drop you off and come back when you are ready to be discharged.        Special note: Every effort is made to have your procedure done on time. Please understand that emergencies sometimes delay scheduled procedures.  2. Diet: Do not eat solid foods after midnight.  You may have clear liquids until 5 AM the day of the procedure.   3. Medication instructions in preparation for your procedure:   Contrast Allergy: No  Hold farxiga for 3 days  Hold metformin 2 days prior and 1 day after  On the morning of your procedure, take Aspirin 81 mg and any morning medicines NOT listed above.  You may use sips of water.  5. Plan to go home the same day, you will only stay overnight if medically necessary. 6. You MUST have a responsible adult to drive you home. 7. An adult MUST be with you the first 24 hours after you arrive home. 8. Bring a current list of your medications, and the last time and date  medication taken. 9. Bring ID and current insurance cards. 10.Please wear clothes that are easy to get on and off and wear slip-on shoes.  Thank you for allowing [place] to care for you!   -- [facility]    Follow-Up: At [facility], you and your health needs are our priority.  As part of our continuing mission to provide you with exceptional heart care, we have created designated Provider Care Teams.  These Care Teams include your primary Cardiologist (physician) and Advanced Practice Providers (APPs -  Physician Assistants and Nurse Practitioners) who all work together to provide you with the care you need, when you need it.    Your next appointment:   3-4 week(s)  Provider:   Azalee Course, PA

## 2023-09-12 NOTE — Progress Notes (Unsigned)
Cardiology Office Note:  .   Date:  09/14/2023  ID:  Fred [name], DOB 03/22/1941, MRN [id number] PCP: Noberto Retort, MD  [facility] Providers Cardiologist:  Nanetta Batty, MD     History of Present Illness: .   Fred [name] is a 82 y.o. male with past medical history of dizziness, CAD, hyperlipidemia and DM II.  He has family history of heart disease with mother who had a CABG and a younger brother who died.  Previous heart monitor obtained on 10/12/2019 showed sinus rhythm, occasional PAC and PVCs, occasional run of PSVT.  Coronary CTA obtained in October 2020 demonstrated three-vessel CAD.  Echocardiogram obtained on 10/06/2019 showed EF 55 to 60%, grade 1 DD no significant valve issue.  Subsequent cardiac catheterization performed on 10/11/2019 confirmed the left main and three-vessel disease.  He underwent 4 vessel CABG by Dr. [name] with LIMA to LAD, SVG to ramus, OM and PDA.  He was last seen by Dr. Allyson Sabal in January 2024 at which time he denied any discomfort.  1 year follow-up was recommended.   I last saw the patient on 08/22/2023 at which time he described vague chest discomfort.  He complains that he has been noticing the vague chest discomfort more during physical exertion.  The location is similar to the previous angina, however symptom was quite mild.  EKG however showed T wave inversion in the septal lead and new Q waves in the inferior lead.  We proceeded with a PET stress test and echocardiogram.  Echocardiogram obtained on 08/26/2023 showed EF 55 to 60%, no regional wall motion abnormality, grade 1 DD, mild to moderate TR, mild to moderate AI.  PET stress test obtained on 09/09/2023 showed a large defect with moderate reduction in uptake present in the mid to basal anterior lateral location which was reversible, consistent with ischemia, EF 44%.  Patient presents today for follow-up.  He denies any shortness of breath.  He continued to have fatigue with physical  exertion and a mild chest comfort.  We discussed the recent stress test with the patient.  He is willing to proceed with cardiac authorization.  Risk and benefit has been discussed with the patient who is agreeable to proceed.  We will see the patient back in 3 to 4 weeks.  ROS:   He complains of increased fatigue with exertion and mild chest ache with activity.  He has no shortness of breath, lower extremity edema, orthopnea or PND.  Studies Reviewed: .        Cardiac Studies & Procedures   CARDIAC CATHETERIZATION  CARDIAC CATHETERIZATION 10/11/2019  Narrative Images from the original result were not included.   Mid LM to Dist LM lesion is 80% stenosed.  Ost LAD to Prox LAD lesion is 90% stenosed.  Ost Cx to Prox Cx lesion is 90% stenosed.  Prox RCA to Mid RCA lesion is 90% stenosed.  Fred [name] is a 82 y.o. male   [id number] LOCATION:  FACILITY: [facility] PHYSICIAN: Nanetta Batty, M.D. 12-01-1941   DATE OF PROCEDURE:  10/11/2019  DATE OF DISCHARGE:     CARDIAC CATHETERIZATION    History obtained from chart review.Fred [name] is a 82 y.o.  mildly overweight weight married Caucasian male father of 2, grandfather of 4 grandchildren who is referred by the ER and Dr. Tiburcio Pea for evaluation of dizziness.  He retired in 2000 from working at [employer] in [place].  I last saw him in  the office 09/07/2019. His cardiac risk factors include treated diabetes and hyperlipidemia.  He does not smoke.  He does have a family history of heart disease with a mother that had CABG and a younger brother who died as well which was found in the evaluation of syncope.  He is never had a heart attack or stroke.  He denies chest pain or shortness of breath.  He is fairly active and routinely walked 3 miles a day up until COVID-19 when he became more sedentary however recently he is try to increase his activity.  On 08/30/2019 he went out for his morning run of 3 miles  and at the end he felt weak.  When driving back home he thought that he may have passed out but he was dizzy.  He went to the ER where he was nauseated as well.  ER evaluation was unrevealing.  He said he felt a little bit of chest discomfort this morning when coming to the office but otherwise specifically denied the symptoms.  I performed event monitoring which is still processing.  A coronary CTA did show three-vessel disease.  Based on this, I recommended that we proceed with outpatient diagnostic coronary angiography.  Impression Fred [name] has left main/three-vessel disease with preserved LV function by 2D echo.  He has surgical anatomy.  Given the severe nature of his blockage I favor keeping him for CABG.  T CTS has been consulted.  The sheath was removed and a TR band was placed on the right wrist to achieve patent hemostasis.  The patient left the lab in stable condition.  Nanetta Batty. MD, [facility] 10/11/2019 11:53 AM  Findings Coronary Findings Diagnostic  Dominance: Right  Left Main Mid LM to Dist LM lesion is 80% stenosed. The lesion is moderately calcified.  Left Anterior Descending Ost LAD to Prox LAD lesion is 90% stenosed. The lesion is calcified.  Left Circumflex Ost Cx to Prox Cx lesion is 90% stenosed.  Right Coronary Artery Prox RCA to Mid RCA lesion is 90% stenosed. The lesion is calcified.  Intervention  No interventions have been documented.   STRESS TESTS  NM PET CT CARDIAC PERFUSION MULTI W/ABSOLUTE BLOODFLOW 09/09/2023  Narrative   LV perfusion is abnormal. Defect 1: There is a large defect with moderate reduction in uptake present in the mid to basal anterolateral location(s) that is reversible. There is normal wall motion in the defect area. Consistent with ischemia.   Rest left ventricular function is abnormal. Rest global function is mildly reduced. There were no regional wall motion abnormalities. Rest EF: 44%. Stress left ventricular function is  abnormal. Stress global function is mildly reduced. There were no regional wall abnormalities. Stress EF: 50%. End diastolic cavity size is mildly enlarged. End systolic cavity size is mildly enlarged.   Myocardial blood flow reserve is not reported in this patient due to technical or patient-specific concerns that affect accuracy. Global MBFR abnormal 1.73 but may not be accurate in setting of prior CABG   Coronary calcium assessment not performed due to prior revascularization. Not commented on in setting of prior CABG   Findings are consistent with ischemia. The study is intermediate risk.  EXAM: OVER-READ INTERPRETATION  CT CHEST  The following report is a limited chest CT over-read performed by radiologist Dr. Irma Newness [facility] on 09/09/2023. This over-read does not include interpretation of cardiac or coronary anatomy or pathology nor does it include evaluation of the PET data. The cardiac PET-CT interpretation by  the cardiologist is attached.  COMPARISON:  Chest CT 04/12/2021  FINDINGS: Mediastinum/Nodes: No enlarged lymph nodes within the visualized mediastinum.Aortic atherosclerosis noted post median sternotomy and CABG.  Lungs/Pleura: There is no pleural effusion. Mild central airway thickening. The lungs are otherwise clear without confluent airspace disease or suspicious nodularity.  Upper abdomen: No significant findings in the visualized upper abdomen.  Musculoskeletal/Chest wall: Chronic nonunion of the median sternotomy. The sternotomy wires are intact. No acute osseous findings.  IMPRESSION: 1. No acute extracardiac findings. 2. Chronic nonunion of the median sternotomy. 3.  Aortic Atherosclerosis (ICD10-I70.0).   Electronically Signed By: Carey Bullocks M.D. On: 09/09/2023 11:17   ECHOCARDIOGRAM  ECHOCARDIOGRAM COMPLETE 08/26/2023  Narrative ECHOCARDIOGRAM REPORT    Patient Name:   HAMISH BANKS Date of Exam:  08/26/2023 Medical Rec #:  [id number]       Height:       67.0 in Accession #:    [id number]      Weight:       186.6 lb Date of Birth:  [birth year]       BSA:          1.964 m Patient Age:    82 years        BP:           102/52 mmHg Patient Gender: M               HR:           69 bpm. Exam Location:  [address]  Procedure: 3D Echo, 2D Echo, Cardiac Doppler and Color Doppler  Indications:    Chest Discomfort R07.89  History:        Patient has prior history of Echocardiogram examinations, most recent 10/06/2019. CAD, Prior CABG; Risk Factors:Diabetes.  Sonographer:    Thurman Coyer RDCS Referring Phys: [id number] Shelise Maron  IMPRESSIONS   1. Left ventricular ejection fraction, by estimation, is 55 to 60%. Left ventricular ejection fraction by 3D volume is 57 %. The left ventricle has normal function. The left ventricle has no regional wall motion abnormalities. Left ventricular diastolic parameters are consistent with Grade I diastolic dysfunction (impaired relaxation). 2. Right ventricular systolic function is normal. The right ventricular size is mildly enlarged. There is normal pulmonary artery systolic pressure. 3. The mitral valve is normal in structure. No evidence of mitral valve regurgitation. No evidence of mitral stenosis. 4. Tricuspid valve regurgitation is mild to moderate. 5. The aortic valve is normal in structure. Aortic valve regurgitation is mild to moderate. Aortic valve sclerosis/calcification is present, without any evidence of aortic stenosis. Aortic regurgitation PHT measures 387 msec. 6. The inferior vena cava is normal in size with greater than 50% respiratory variability, suggesting right atrial pressure of 3 mmHg.  FINDINGS Left Ventricle: Left ventricular ejection fraction, by estimation, is 55 to 60%. Left ventricular ejection fraction by 3D volume is 57 %. The left ventricle has normal function. The left ventricle has no regional wall motion abnormalities.  The left ventricular internal cavity size was normal in size. There is no left ventricular hypertrophy. Left ventricular diastolic parameters are consistent with Grade I diastolic dysfunction (impaired relaxation).  Right Ventricle: The right ventricular size is mildly enlarged. No increase in right ventricular wall thickness. Right ventricular systolic function is normal. There is normal pulmonary artery systolic pressure. The tricuspid regurgitant velocity is 2.18 m/s, and with an assumed right atrial pressure of 3 mmHg, the estimated right ventricular systolic pressure is 22.0 mmHg.  Left  Atrium: Left atrial size was normal in size.  Right Atrium: Right atrial size was normal in size.  Pericardium: There is no evidence of pericardial effusion.  Mitral Valve: The mitral valve is normal in structure. No evidence of mitral valve regurgitation. No evidence of mitral valve stenosis.  Tricuspid Valve: The tricuspid valve is normal in structure. Tricuspid valve regurgitation is mild to moderate. No evidence of tricuspid stenosis.  Aortic Valve: The aortic valve is normal in structure. Aortic valve regurgitation is mild to moderate. Aortic regurgitation PHT measures 387 msec. Aortic valve sclerosis/calcification is present, without any evidence of aortic stenosis.  Pulmonic Valve: The pulmonic valve was normal in structure. Pulmonic valve regurgitation is not visualized. No evidence of pulmonic stenosis.  Aorta: The aortic root is normal in size and structure.  Venous: The inferior vena cava is normal in size with greater than 50% respiratory variability, suggesting right atrial pressure of 3 mmHg.  IAS/Shunts: No atrial level shunt detected by color flow Doppler.   LEFT VENTRICLE PLAX 2D LVIDd:         4.70 cm         Diastology LVIDs:         3.10 cm         LV e' medial:    4.98 cm/s LV PW:         0.90 cm         LV E/e' medial:  9.4 LV IVS:        1.00 cm         LV e' lateral:   7.23  cm/s LVOT diam:     2.20 cm         LV E/e' lateral: 6.5 LV SV:         70 LV SV Index:   35 LVOT Area:     3.80 cm        3D Volume EF LV 3D EF:    Left ventricul ar ejection fraction by 3D volume is 57 %.  3D Volume EF: 3D EF:        57 % LV EDV:       133 ml LV ESV:       58 ml LV SV:        75 ml  RIGHT VENTRICLE RV Basal diam:  3.90 cm RV Mid diam:    4.10 cm RV S prime:     9.02 cm/s TAPSE (M-mode): 1.3 cm  LEFT ATRIUM           Index        RIGHT ATRIUM           Index LA diam:      3.60 cm 1.83 cm/m   RA Area:     16.40 cm LA Vol (A2C): 63.0 ml 32.09 ml/m  RA Volume:   45.70 ml  23.27 ml/m LA Vol (A4C): 43.3 ml 22.05 ml/m AORTIC VALVE LVOT Vmax:   85.40 cm/s LVOT Vmean:  56.200 cm/s LVOT VTI:    0.183 m AI PHT:      387 msec  AORTA Ao Root diam: 3.50 cm Ao Asc diam:  3.40 cm  MITRAL VALVE               TRICUSPID VALVE MV Area (PHT): 2.87 cm    TR Peak grad:   19.0 mmHg MV Decel Time: 264 msec    TR Vmax:        218.00 cm/s MV E  velocity: 46.80 cm/s MV A velocity: 84.20 cm/s  SHUNTS MV E/A ratio:  0.56        Systemic VTI:  0.18 m Systemic Diam: 2.20 cm  Kardie Tobb DO Electronically signed by Thomasene Ripple DO Signature Date/Time: 08/26/2023/11:09:55 AM    Final   TEE  ECHO INTRAOPERATIVE TEE 10/12/2019  Narrative *INTRAOPERATIVE TRANSESOPHAGEAL REPORT *    Patient Name:   BENEN WEIDA Date of Exam: 10/12/2019 Medical Rec #:  [id number]       Height:       67.0 in Accession #:    [id number]      Weight:       197.2 lb Date of Birth:  1941-06-29       BSA:          2.01 m Patient Age:    78 years        BP:           130/70 mmHg Patient Gender: M               HR:           65 bpm. Exam Location:  Anesthesiology  Transesophogeal exam was perform intraoperatively during surgical procedure. Patient was closely monitored under general anesthesia during the entirety of examination.  Indications:     I25.110 Atherosclerotic heart  disease of native coronary artery with unstable angina pectoris History:         CAD Signs/Symptoms:Chest Pain Risk Factors:Diabetes. Medications:Beta Blockers. Performing Phys: [name] Diagnosing Phys: Leslye Peer MD  Complications: No known complications during this procedure. POST-OP IMPRESSIONS - Left Ventricle: The left ventricle is unchanged from pre-bypass. - Right Ventricle: The right ventricle appears unchanged from pre-bypass. - Aorta: The aorta appears unchanged from pre-bypass. - Left Atrium: The left atrium appears unchanged from pre-bypass. - Left Atrial Appendage: The left atrial appendage appears unchanged from pre-bypass. - Aortic Valve: The aortic valve appears unchanged from pre-bypass. - Mitral Valve: The mitral valve appears unchanged from pre-bypass. - Tricuspid Valve: The tricuspid valve appears unchanged from pre-bypass. - Interatrial Septum: The interatrial septum appears unchanged from pre-bypass. - Interventricular Septum: The interventricular septum appears unchanged from pre-bypass. - Pericardium: The pericardium appears unchanged from pre-bypass.  PRE-OP FINDINGS Left Ventricle: The left ventricle has normal systolic function, with an ejection fraction of 55-60%. The cavity size was normal. There is no increase in left ventricular wall thickness.  Right Ventricle: The right ventricle has normal systolic function. The cavity was normal. There is no increase in right ventricular wall thickness.  Left Atrium: Left atrial size was normal in size. The left atrial appendage is well visualized and there is no evidence of thrombus present.  Right Atrium: Right atrial size was normal in size. Right atrial pressure is estimated at 10 mmHg.  Interatrial Septum: No atrial level shunt detected by color flow Doppler. Increased thickness of the atrial septum sparing the fossa ovalis consistent with The interatrial septum appears to be  lipomatous.  Pericardium: There is no evidence of pericardial effusion.  Mitral Valve: The mitral valve is normal in structure. Mitral valve regurgitation is not visualized by color flow Doppler.  Tricuspid Valve: The tricuspid valve was normal in structure. Tricuspid valve regurgitation is trivial by color flow Doppler.  Aortic Valve: The aortic valve is tricuspid Aortic valve regurgitation is mild by color flow Doppler. There is no evidence of aortic valve stenosis.  Pulmonic Valve: The pulmonic valve was normal in structure. Pulmonic  valve regurgitation is not visualized by color flow Doppler.   Aorta: The aortic root, ascending aorta and aortic arch are normal in size and structure.   Leslye Peer MD Electronically signed by Leslye Peer MD Signature Date/Time: 10/12/2019/1:40:11 PM    Final   MONITORS  LONG TERM MONITOR (3-14 DAYS) 10/12/2019  Narrative 1: Sinus rhythm/sinus bradycardia/sinus tachycardia 2: Occasional PACs and PVCs 3: Occasional runs of PSVT   CT SCANS  CT CORONARY MORPH W/CTA COR W/SCORE 09/21/2019  Addendum 10/02/2019  9:52 AM ADDENDUM REPORT: 10/02/2019 09:50  EXAM: CT FFR ANALYSIS  CLINICAL DATA:  82 year old male with family history of CAD requiring CABG with abnormal coronary CT  FINDINGS: FFRct analysis was performed on the original cardiac CT angiogram dataset. Diagrammatic representation of the FFRct analysis is provided in a separate PDF document in PACS. This dictation was created using the PDF document and an interactive 3D model of the results. 3D model is not available in the EMR/PACS. Normal FFR range is >0.80.  1. Left Main:  No significant stenosis.  2. LAD: There is significant flow limiting stenosis (FFR 0.68-0.64) approximately 8 mm distal to moderate sized first diagonal branch. 3. LCX: FFR is 0.79 (just below normal of 0.8) after the origin of left circumflex. 4. RCA: There is significant flow limiting stenosis of  proximal RCA (FFR 0.66).  IMPRESSION: 1. CT FFR analysis demonstrates significant 3 vessel flow limiting CAD.   Electronically Signed By: Donato Schultz MD On: 10/02/2019 09:50  Addendum 10/01/2019  4:41 PM ADDENDUM REPORT: 10/01/2019 16:39  EXAM: Cardiac/Coronary  CTA  TECHNIQUE: The patient was scanned on a [employer].  FINDINGS: A 100 kV prospective scan was triggered in the descending thoracic aorta at 111 HU's. Axial non-contrast 3 mm slices were carried out through the heart. The data set was analyzed on a dedicated work station and scored using the Agatson method. Gantry rotation speed was 250 msecs and collimation was .6 mm. 0.8 mg of sl NTG was given. The 3D data set was reconstructed in 5% intervals of the 67-82 % of the R-R cycle. Diastolic phases were analyzed on a dedicated work station using MPR, MIP and VRT modes. The patient received 80 cc of contrast.  Aorta:  Normal size.  No calcifications.  No dissection.  Aortic Valve:  Trileaflet.  No calcifications.  Coronary Arteries:  Normal coronary origin.  Right dominance.  RCA is a large dominant artery that gives rise to PDA and PLA. There is dense calcified plaque throughout the proximal to mid vessel with proximal lesion up to 75% just following first area of proximal calcification.  Left main is a large artery that gives rise to LAD and LCX arteries.  LAD is a large vessel that gives rise to moderate size diagonal. There is proximal dense calcified plaque with small caliber mid and distal vessel. Potential severe lesion proximally.  LCX is a non-dominant artery that gives rise to one moderate sized OM1 branch. There is dense calcification of the proximal vessel. Unable to accurately estimate level of stenosis.  Other findings:  3 right sided and 2 left sided pulmonary veins drainage into the left atrium.  Normal left atrial appendage without a thrombus.  Normal size of the  pulmonary artery.  IMPRESSION: 1. Coronary calcium score of 1619. This was 31 percentile for age and sex matched control.  2. Normal coronary origin with right dominance.  3. 3 vessel coronary artery disease with what appears to be  flow limiting stenosis in proximal LAD, RCA. Dense calcified plaque present. Unable to accurately estimate circumflex stenosis due to dense calcification proximally. CAD-RADS -4.  4.  No significant aortic atherosclerosis.  5. Recommend cardiac catheterization but prior to this will send for FFR analysis.   Electronically Signed By: Donato Schultz MD On: 10/01/2019 16:39  Narrative EXAM: OVER-READ INTERPRETATION  CT CHEST  The following report is an over-read performed by radiologist Dr. Trudie Reed of [facility] on 09/21/2019. This over-read does not include interpretation of cardiac or coronary anatomy or pathology. The coronary calcium score/coronary CTA interpretation by the cardiologist is attached.  COMPARISON:  None.  FINDINGS: Within the visualized portions of the thorax there are no suspicious appearing pulmonary nodules or masses, there is no acute consolidative airspace disease, no pleural effusions, no pneumothorax and no lymphadenopathy. Visualized portions of the upper abdomen are unremarkable. There are no aggressive appearing lytic or blastic lesions noted in the visualized portions of the skeleton.  IMPRESSION: 1. No significant incidental noncardiac findings are noted.  Electronically Signed: By: Trudie Reed M.D. On: 09/21/2019 16:22          Risk Assessment/Calculations:            Physical Exam:   VS:  BP (!) 102/50   Pulse 79   Ht 5\' 7"  (1.702 m)   Wt 186 lb 3.2 oz (84.5 kg)   SpO2 97%   BMI 29.16 kg/m    Wt Readings from Last 3 Encounters:  09/12/23 186 lb 3.2 oz (84.5 kg)  08/22/23 186 lb 9.6 oz (84.6 kg)  06/05/23 189 lb (85.7 kg)    GEN: Well nourished, well developed in no  acute distress NECK: No JVD; No carotid bruits CARDIAC: RRR, no murmurs, rubs, gallops RESPIRATORY:  Clear to auscultation without rales, wheezing or rhonchi  ABDOMEN: Soft, non-tender, non-distended EXTREMITIES:  No edema; No deformity   ASSESSMENT AND PLAN: .    Abnormal stress test  -Recent PET stress test showed reversible lesion in the mid to distal anterior lateral location.  -Various options has been discussed with the patient, we ultimately recommend proceed with cardiac catheterization for definitive evaluation.  Risk and benefit has been discussed with the patient who is agreeable to proceed.  CAD s/p CABG: On aspirin, metoprolol and simvastatin  Hyperlipidemia: Continue simvastatin  DM2: On insulin    Informed Consent   Shared Decision Making/Informed Consent The risks [stroke (1 in 1000), death (1 in 1000), kidney failure [usually temporary] (1 in 500), bleeding (1 in 200), allergic reaction [possibly serious] (1 in 200)], benefits (diagnostic support and management of coronary artery disease) and alternatives of a cardiac catheterization were discussed in detail with Mr. Killgore and he is willing to proceed.     Dispo: Follow-up in 3 to 4 weeks  Signed, Azalee Course, [place]

## 2023-09-14 ENCOUNTER — Other Ambulatory Visit: Payer: Self-pay | Admitting: Physician Assistant

## 2023-09-16 ENCOUNTER — Telehealth: Payer: Self-pay | Admitting: *Deleted

## 2023-09-16 ENCOUNTER — Ambulatory Visit: Payer: Medicare Other | Admitting: Physician Assistant

## 2023-09-16 NOTE — Telephone Encounter (Signed)
Cardiac Catheterization scheduled at [facility] for: Thursday September 18, 2023 7:30 AM Arrival time [facility] Main Entrance A at: 5:30 AM  Nothing to eat after midnight prior to procedure, clear liquids until 5 AM day of procedure.  Medication instructions: -Hold:  Metformin-day of procedure and 48 hours post procedure  Farxiga-AM of procedure  Toujeo-1/2 usual dose HS prior to procedure  Semiglutide-weekly on Sudays-pt reports last dose 09/14/23 -Other usual morning medications can be taken with sips of water including aspirin 81 mg.  Plan to go home the same day, you will only stay overnight if medically necessary.  You must have responsible adult to drive you home.  Someone must be with you the first 24 hours after you arrive home.  Reviewed procedure instructions with patient.

## 2023-09-18 ENCOUNTER — Other Ambulatory Visit (HOSPITAL_COMMUNITY): Payer: Self-pay

## 2023-09-18 ENCOUNTER — Other Ambulatory Visit: Payer: Self-pay

## 2023-09-18 ENCOUNTER — Encounter (HOSPITAL_COMMUNITY)
Admission: RE | Disposition: A | Discharge status: Home / Self Care | Payer: Self-pay | Source: Home / Self Care | Attending: Cardiology

## 2023-09-18 ENCOUNTER — Ambulatory Visit (HOSPITAL_COMMUNITY)
Admission: RE | Admit: 2023-09-18 | Discharge: 2023-09-18 | Disposition: A | Discharge status: Home / Self Care | Payer: Medicare Other | Attending: Cardiology | Admitting: Cardiology

## 2023-09-18 DIAGNOSIS — I25719 Atherosclerosis of autologous vein coronary artery bypass graft(s) with unspecified angina pectoris: Secondary | ICD-10-CM | POA: Diagnosis not present

## 2023-09-18 DIAGNOSIS — E785 Hyperlipidemia, unspecified: Secondary | ICD-10-CM | POA: Diagnosis not present

## 2023-09-18 DIAGNOSIS — I25810 Atherosclerosis of coronary artery bypass graft(s) without angina pectoris: Secondary | ICD-10-CM | POA: Insufficient documentation

## 2023-09-18 DIAGNOSIS — Z794 Long term (current) use of insulin: Secondary | ICD-10-CM | POA: Diagnosis not present

## 2023-09-18 DIAGNOSIS — R9439 Abnormal result of other cardiovascular function study: Secondary | ICD-10-CM | POA: Diagnosis not present

## 2023-09-18 DIAGNOSIS — Z8249 Family history of ischemic heart disease and other diseases of the circulatory system: Secondary | ICD-10-CM | POA: Diagnosis not present

## 2023-09-18 DIAGNOSIS — I2584 Coronary atherosclerosis due to calcified coronary lesion: Secondary | ICD-10-CM | POA: Insufficient documentation

## 2023-09-18 DIAGNOSIS — E119 Type 2 diabetes mellitus without complications: Secondary | ICD-10-CM | POA: Diagnosis not present

## 2023-09-18 DIAGNOSIS — I209 Angina pectoris, unspecified: Secondary | ICD-10-CM | POA: Diagnosis present

## 2023-09-18 DIAGNOSIS — T82858A Stenosis of vascular prosthetic devices, implants and grafts, initial encounter: Secondary | ICD-10-CM | POA: Insufficient documentation

## 2023-09-18 DIAGNOSIS — I2510 Atherosclerotic heart disease of native coronary artery without angina pectoris: Secondary | ICD-10-CM | POA: Diagnosis present

## 2023-09-18 DIAGNOSIS — X58XXXA Exposure to other specified factors, initial encounter: Secondary | ICD-10-CM | POA: Insufficient documentation

## 2023-09-18 DIAGNOSIS — Z955 Presence of coronary angioplasty implant and graft: Secondary | ICD-10-CM

## 2023-09-18 HISTORY — PX: CORONARY STENT INTERVENTION: CATH118234

## 2023-09-18 HISTORY — PX: LEFT HEART CATH AND CORS/GRAFTS ANGIOGRAPHY: CATH118250

## 2023-09-18 LAB — POCT ACTIVATED CLOTTING TIME
Activated Clotting Time: 269 s
Activated Clotting Time: 275 s

## 2023-09-18 LAB — GLUCOSE, CAPILLARY
Glucose-Capillary: 137 mg/dL — ABNORMAL HIGH (ref 70–99)
Glucose-Capillary: 105 mg/dL — ABNORMAL HIGH (ref 70–99)

## 2023-09-18 SURGERY — LEFT HEART CATH AND CORS/GRAFTS ANGIOGRAPHY
Anesthesia: LOCAL

## 2023-09-18 MED ORDER — NITROGLYCERIN 1 MG/10 ML FOR IR/CATH LAB
INTRA_ARTERIAL | Status: AC
  Filled 2023-09-18: qty 10

## 2023-09-18 MED ORDER — HYDRALAZINE HCL 20 MG/ML IJ SOLN: 10.0000 mg | INTRAMUSCULAR | Status: AC | PRN

## 2023-09-18 MED ORDER — LIDOCAINE HCL (PF) 1 % IJ SOLN
INTRAMUSCULAR | Status: DC | PRN
  Administered 2023-09-18: 2 mL

## 2023-09-18 MED ORDER — ROSUVASTATIN CALCIUM 20 MG PO TABS
20.0000 mg | ORAL_TABLET | Freq: Every day | ORAL | 3 refills | Status: DC
  Filled 2023-09-18: qty 90, 90d supply, fill #0

## 2023-09-18 MED ORDER — CLOPIDOGREL BISULFATE 300 MG PO TABS
ORAL_TABLET | ORAL | Status: DC | PRN
  Administered 2023-09-18: 600 mg via ORAL

## 2023-09-18 MED ORDER — FAMOTIDINE IN NACL 20-0.9 MG/50ML-% IV SOLN
INTRAVENOUS | Status: AC
  Filled 2023-09-18: qty 50

## 2023-09-18 MED ORDER — HEPARIN SODIUM (PORCINE) 1000 UNIT/ML IJ SOLN
INTRAMUSCULAR | Status: AC
  Filled 2023-09-18: qty 10

## 2023-09-18 MED ORDER — FAMOTIDINE IN NACL 20-0.9 MG/50ML-% IV SOLN
INTRAVENOUS | Status: DC | PRN
  Administered 2023-09-18: 20 mg via INTRAVENOUS

## 2023-09-18 MED ORDER — LABETALOL HCL 5 MG/ML IV SOLN: 10.0000 mg | INTRAVENOUS | Status: AC | PRN

## 2023-09-18 MED ORDER — LIDOCAINE HCL (PF) 1 % IJ SOLN
INTRAMUSCULAR | Status: AC
  Filled 2023-09-18: qty 30

## 2023-09-18 MED ORDER — SODIUM CHLORIDE 0.9 % WEIGHT BASED INFUSION: 1.0000 mL/kg/h | INTRAVENOUS | Status: DC

## 2023-09-18 MED ORDER — SODIUM CHLORIDE 0.9% FLUSH: 3.0000 mL | Freq: Two times a day (BID) | INTRAVENOUS | Status: DC

## 2023-09-18 MED ORDER — ONDANSETRON HCL 4 MG/2ML IJ SOLN: 4.0000 mg | Freq: Four times a day (QID) | INTRAMUSCULAR | Status: DC | PRN

## 2023-09-18 MED ORDER — VERAPAMIL HCL 2.5 MG/ML IV SOLN
INTRAVENOUS | Status: AC
  Filled 2023-09-18: qty 2

## 2023-09-18 MED ORDER — CLOPIDOGREL BISULFATE 300 MG PO TABS
ORAL_TABLET | ORAL | Status: AC
  Filled 2023-09-18: qty 2

## 2023-09-18 MED ORDER — CLOPIDOGREL BISULFATE 75 MG PO TABS
75.0000 mg | ORAL_TABLET | Freq: Every day | ORAL | 2 refills | Status: DC
  Filled 2023-09-18: qty 90, 90d supply, fill #0

## 2023-09-18 MED ORDER — NITROGLYCERIN 1 MG/10 ML FOR IR/CATH LAB
INTRA_ARTERIAL | Status: DC | PRN
  Administered 2023-09-18 (×2): 200 ug

## 2023-09-18 MED ORDER — SODIUM CHLORIDE 0.9% FLUSH: 3.0000 mL | INTRAVENOUS | Status: DC | PRN

## 2023-09-18 MED ORDER — ASPIRIN 81 MG PO CHEW: 81.0000 mg | CHEWABLE_TABLET | ORAL | Status: DC

## 2023-09-18 MED ORDER — SODIUM CHLORIDE 0.9 % IV SOLN: 250.0000 mL | INTRAVENOUS | Status: DC | PRN

## 2023-09-18 MED ORDER — CLOPIDOGREL BISULFATE 75 MG PO TABS: 75.0000 mg | ORAL_TABLET | Freq: Every day | ORAL | Status: DC

## 2023-09-18 MED ORDER — HEPARIN SODIUM (PORCINE) 1000 UNIT/ML IJ SOLN
INTRAMUSCULAR | Status: DC | PRN
  Administered 2023-09-18: 3000 [IU] via INTRAVENOUS
  Administered 2023-09-18: 5000 [IU] via INTRAVENOUS
  Administered 2023-09-18: 4000 [IU] via INTRAVENOUS

## 2023-09-18 MED ORDER — MIDAZOLAM HCL 2 MG/2ML IJ SOLN
INTRAMUSCULAR | Status: DC | PRN
  Administered 2023-09-18: 1 mg via INTRAVENOUS

## 2023-09-18 MED ORDER — MIDAZOLAM HCL 2 MG/2ML IJ SOLN
INTRAMUSCULAR | Status: AC
  Filled 2023-09-18: qty 2

## 2023-09-18 MED ORDER — FENTANYL CITRATE (PF) 100 MCG/2ML IJ SOLN
INTRAMUSCULAR | Status: AC
  Filled 2023-09-18: qty 2

## 2023-09-18 MED ORDER — IOHEXOL 350 MG/ML SOLN
INTRAVENOUS | Status: DC | PRN
  Administered 2023-09-18: 90 mL

## 2023-09-18 MED ORDER — ACETAMINOPHEN 325 MG PO TABS: 650.0000 mg | ORAL_TABLET | ORAL | Status: DC | PRN

## 2023-09-18 MED ORDER — FENTANYL CITRATE (PF) 100 MCG/2ML IJ SOLN
INTRAMUSCULAR | Status: DC | PRN
  Administered 2023-09-18 (×2): 25 ug via INTRAVENOUS

## 2023-09-18 MED ORDER — VERAPAMIL HCL 2.5 MG/ML IV SOLN
INTRAVENOUS | Status: DC | PRN
  Administered 2023-09-18: 10 mL via INTRA_ARTERIAL

## 2023-09-18 MED ORDER — SODIUM CHLORIDE 0.9 % IV SOLN: INTRAVENOUS | Status: AC

## 2023-09-18 MED ORDER — SODIUM CHLORIDE 0.9 % WEIGHT BASED INFUSION
3.0000 mL/kg/h | INTRAVENOUS | Status: AC
  Administered 2023-09-18: 3 mL/kg/h via INTRAVENOUS

## 2023-09-18 MED ORDER — HEPARIN (PORCINE) IN NACL 1000-0.9 UT/500ML-% IV SOLN
INTRAVENOUS | Status: DC | PRN
  Administered 2023-09-18 (×3): 500 mL

## 2023-09-18 SURGICAL SUPPLY — 20 items
BALL SAPPHIRE NC24 4.0X8 (BALLOONS) ×1
BALLOON SAPPHIRE NC24 4.0X8 (BALLOONS) IMPLANT
CATH EXPO 5F MPA-1 (CATHETERS) IMPLANT
CATH INFINITI 5 FR AR1 MOD (CATHETERS) IMPLANT
CATH INFINITI 5 FR IM (CATHETERS) IMPLANT
CATH INFINITI 5 FR LCB (CATHETERS) IMPLANT
CATH INFINITI 5FR MULTPACK ANG (CATHETERS) IMPLANT
CATHETER LAUNCHER 6FR LCB (CATHETERS) IMPLANT
DEVICE RAD COMP TR BAND LRG (VASCULAR PRODUCTS) IMPLANT
DEVICE SPIDERFX EMB PROT 5MM (WIRE) IMPLANT
GLIDESHEATH SLEND SS 6F .021 (SHEATH) IMPLANT
GUIDEWIRE INQWIRE 1.5J.035X260 (WIRE) IMPLANT
INQWIRE 1.5J .035X260CM (WIRE) ×1
KIT HEMO VALVE WATCHDOG (MISCELLANEOUS) IMPLANT
KIT SYRINGE INJ CVI SPIKEX1 (MISCELLANEOUS) IMPLANT
PACK CARDIAC CATHETERIZATION (CUSTOM PROCEDURE TRAY) ×2 IMPLANT
SET ATX-X65L (MISCELLANEOUS) IMPLANT
STENT SYNERGY XD 4.0X12 (Permanent Stent) IMPLANT
SYNERGY XD 4.0X12 (Permanent Stent) ×1 IMPLANT
WIRE ASAHI PROWATER 180CM (WIRE) IMPLANT

## 2023-09-18 NOTE — Discharge Instructions (Signed)

## 2023-09-18 NOTE — Progress Notes (Signed)
TR BAND REMOVAL  LOCATION:    left radial  DEFLATED PER PROTOCOL:    Yes.    TIME BAND OFF / DRESSING APPLIED:    1140 gauze dressing applied    SITE UPON ARRIVAL:    Level 0  SITE AFTER BAND REMOVAL:    Level 0  CIRCULATION SENSATION AND MOVEMENT:    Within Normal Limits   Yes.    COMMENTS:   no issues noted

## 2023-09-18 NOTE — Progress Notes (Signed)
Pt was educated on stent card, stent location, Antiplatelet and ASA use, wt restrictions, no baths/daily wash-ups, s/s of infection, ex guidelines, s/s to stop exercising, NTG use and calling 911, heart healthy diet, risk factors and CRPII. Pt received materials on exercise, diet, and CRPII. Will refer to [facility].   Pt completed CR after bypass surgery, he is interested in the new changes.   Faustino Congress 09/18/2023 1:42 PM

## 2023-09-18 NOTE — H&P (Signed)
OV 09/12/2023 copied for documentation   Cardiology Office Note:  .   Date:  09/18/2023  ID:  Fred [name], DOB 1941/02/16, MRN [id number] PCP: Fred Retort, MD  [facility] Providers Cardiologist:  Fred Batty, MD     History of Present Illness: .   Fred [name] is a 82 y.o. male with past medical history of dizziness, CAD, hyperlipidemia and DM II.  He has family history of heart disease with mother who had a CABG and a younger brother who died.  Previous heart monitor obtained on 10/12/2019 showed sinus rhythm, occasional PAC and PVCs, occasional run of PSVT.  Coronary CTA obtained in October 2020 demonstrated three-vessel CAD.  Echocardiogram obtained on 10/06/2019 showed EF 55 to 60%, grade 1 DD no significant valve issue.  Subsequent cardiac catheterization performed on 10/11/2019 confirmed the left main and three-vessel disease.  He underwent 4 vessel CABG by Fred [name] with LIMA to LAD, SVG to ramus, OM and PDA.  He was last seen by Fred [name] in January 2024 at which time he denied any discomfort.  1 year follow-up was recommended.   I last saw the patient on 08/22/2023 at which time he described vague chest discomfort.  He complains that he has been noticing the vague chest discomfort more during physical exertion.  The location is similar to the previous angina, however symptom was quite mild.  EKG however showed T wave inversion in the septal lead and new Q waves in the inferior lead.  We proceeded with a PET stress test and echocardiogram.  Echocardiogram obtained on 08/26/2023 showed EF 55 to 60%, no regional wall motion abnormality, grade 1 DD, mild to moderate TR, mild to moderate AI.  PET stress test obtained on 09/09/2023 showed a large defect with moderate reduction in uptake present in the mid to basal anterior lateral location which was reversible, consistent with ischemia, EF 44%.  Patient presents today for follow-up.  He denies any shortness of breath.  He  continued to have fatigue with physical exertion and a mild chest comfort.  We discussed the recent stress test with the patient.  He is willing to proceed with cardiac authorization.  Risk and benefit has been discussed with the patient who is agreeable to proceed.  We will see the patient back in 3 to 4 weeks.  ROS:   He complains of increased fatigue with exertion and mild chest ache with activity.  He has no shortness of breath, lower extremity edema, orthopnea or PND.  Studies Reviewed: .        Cardiac Studies & Procedures   CARDIAC CATHETERIZATION  CARDIAC CATHETERIZATION 10/11/2019  Narrative Images from the original result were not included.   Mid LM to Dist LM lesion is 80% stenosed.  Ost LAD to Prox LAD lesion is 90% stenosed.  Ost Cx to Prox Cx lesion is 90% stenosed.  Prox RCA to Mid RCA lesion is 90% stenosed.  Fred [name] is a 82 y.o. male   [id number] LOCATION:  FACILITY: [facility] PHYSICIAN: Fred [name], M.D. 26-Nov-1941   DATE OF PROCEDURE:  10/11/2019  DATE OF DISCHARGE:     CARDIAC CATHETERIZATION    History obtained from chart review.Fred [name] is a 82 y.o.  mildly overweight weight married Caucasian male father of 2, grandfather of 4 grandchildren who is referred by the ER and Fred [name] for evaluation of dizziness.  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  I last saw him in the office 09/07/2019. His cardiac risk factors include treated diabetes and hyperlipidemia.  He does not smoke.  He does have a family history of heart disease with a mother that had CABG and a younger brother who died as well which was found in the evaluation of syncope.  He is never had a heart attack or stroke.  He denies chest pain or shortness of breath.  He is fairly active and routinely walked 3 miles a day up until COVID-19 when he became more sedentary however recently he is try to increase his activity.  On 08/30/2019 he  went out for his morning run of 3 miles and at the end he felt weak.  When driving back home he thought that he may have passed out but he was dizzy.  He went to the ER where he was nauseated as well.  ER evaluation was unrevealing.  He said he felt a little bit of chest discomfort this morning when coming to the office but otherwise specifically denied the symptoms.  I performed event monitoring which is still processing.  A coronary CTA did show three-vessel disease.  Based on this, I recommended that we proceed with outpatient diagnostic coronary angiography.  Impression Fred [name] has left main/three-vessel disease with preserved LV function by 2D echo.  He has surgical anatomy.  Given the severe nature of his blockage I favor keeping him for CABG.  T CTS has been consulted.  The sheath was removed and a TR band was placed on the right wrist to achieve patent hemostasis.  The patient left the lab in stable condition.  Fred [name]. MD, [facility] 10/11/2019 11:53 AM  Findings Coronary Findings Diagnostic  Dominance: Right  Left Main Mid LM to Dist LM lesion is 80% stenosed. The lesion is moderately calcified.  Left Anterior Descending Ost LAD to Prox LAD lesion is 90% stenosed. The lesion is calcified.  Left Circumflex Ost Cx to Prox Cx lesion is 90% stenosed.  Right Coronary Artery Prox RCA to Mid RCA lesion is 90% stenosed. The lesion is calcified.  Intervention  No interventions have been documented.   STRESS TESTS  NM PET CT CARDIAC PERFUSION MULTI W/ABSOLUTE BLOODFLOW 09/09/2023  Narrative   LV perfusion is abnormal. Defect 1: There is a large defect with moderate reduction in uptake present in the mid to basal anterolateral location(s) that is reversible. There is normal wall motion in the defect area. Consistent with ischemia.   Rest left ventricular function is abnormal. Rest global function is mildly reduced. There were no regional wall motion abnormalities. Rest EF: 44%.  Stress left ventricular function is abnormal. Stress global function is mildly reduced. There were no regional wall abnormalities. Stress EF: 50%. End diastolic cavity size is mildly enlarged. End systolic cavity size is mildly enlarged.   Myocardial blood flow reserve is not reported in this patient due to technical or patient-specific concerns that affect accuracy. Global MBFR abnormal 1.73 but may not be accurate in setting of prior CABG   Coronary calcium assessment not performed due to prior revascularization. Not commented on in setting of prior CABG   Findings are consistent with ischemia. The study is intermediate risk.  EXAM: OVER-READ INTERPRETATION  CT CHEST  The following report is a limited chest CT over-read performed by radiologist Dr. Irma Newness [facility] on 09/09/2023. This over-read does not include interpretation of cardiac or coronary anatomy or pathology nor does it include evaluation of the  PET data. The cardiac PET-CT interpretation by the cardiologist is attached.  COMPARISON:  Chest CT 04/12/2021  FINDINGS: Mediastinum/Nodes: No enlarged lymph nodes within the visualized mediastinum.Aortic atherosclerosis noted post median sternotomy and CABG.  Lungs/Pleura: There is no pleural effusion. Mild central airway thickening. The lungs are otherwise clear without confluent airspace disease or suspicious nodularity.  Upper abdomen: No significant findings in the visualized upper abdomen.  Musculoskeletal/Chest wall: Chronic nonunion of the median sternotomy. The sternotomy wires are intact. No acute osseous findings.  IMPRESSION: 1. No acute extracardiac findings. 2. Chronic nonunion of the median sternotomy. 3.  Aortic Atherosclerosis (ICD10-I70.0).   Electronically Signed By: Carey Bullocks M.D. On: 09/09/2023 11:17   ECHOCARDIOGRAM  ECHOCARDIOGRAM COMPLETE 08/26/2023  Narrative ECHOCARDIOGRAM REPORT    Patient Name:   Fred [name] Date of Exam: 08/26/2023 Medical Rec #:  [id number]       Height:       67.0 in Accession #:    [id number]      Weight:       186.6 lb Date of Birth:  June 16, 1941       BSA:          1.964 m Patient Age:    82 years        BP:           102/52 mmHg Patient Gender: M               HR:           69 bpm. Exam Location:  [address]  Procedure: 3D Echo, 2D Echo, Cardiac Doppler and Color Doppler  Indications:    Chest Discomfort R07.89  History:        Patient has prior history of Echocardiogram examinations, most recent 10/06/2019. CAD, Prior CABG; Risk Factors:Diabetes.  Sonographer:    Thurman Coyer RDCS Referring Phys: [id number] HAO MENG  IMPRESSIONS   1. Left ventricular ejection fraction, by estimation, is 55 to 60%. Left ventricular ejection fraction by 3D volume is 57 %. The left ventricle has normal function. The left ventricle has no regional wall motion abnormalities. Left ventricular diastolic parameters are consistent with Grade I diastolic dysfunction (impaired relaxation). 2. Right ventricular systolic function is normal. The right ventricular size is mildly enlarged. There is normal pulmonary artery systolic pressure. 3. The mitral valve is normal in structure. No evidence of mitral valve regurgitation. No evidence of mitral stenosis. 4. Tricuspid valve regurgitation is mild to moderate. 5. The aortic valve is normal in structure. Aortic valve regurgitation is mild to moderate. Aortic valve sclerosis/calcification is present, without any evidence of aortic stenosis. Aortic regurgitation PHT measures 387 msec. 6. The inferior vena cava is normal in size with greater than 50% respiratory variability, suggesting right atrial pressure of 3 mmHg.  FINDINGS Left Ventricle: Left ventricular ejection fraction, by estimation, is 55 to 60%. Left ventricular ejection fraction by 3D volume is 57 %. The left ventricle has normal function. The left ventricle has no regional wall  motion abnormalities. The left ventricular internal cavity size was normal in size. There is no left ventricular hypertrophy. Left ventricular diastolic parameters are consistent with Grade I diastolic dysfunction (impaired relaxation).  Right Ventricle: The right ventricular size is mildly enlarged. No increase in right ventricular wall thickness. Right ventricular systolic function is normal. There is normal pulmonary artery systolic pressure. The tricuspid regurgitant velocity is 2.18 m/s, and with an assumed right atrial pressure of 3 mmHg, the estimated right ventricular  systolic pressure is 22.0 mmHg.  Left Atrium: Left atrial size was normal in size.  Right Atrium: Right atrial size was normal in size.  Pericardium: There is no evidence of pericardial effusion.  Mitral Valve: The mitral valve is normal in structure. No evidence of mitral valve regurgitation. No evidence of mitral valve stenosis.  Tricuspid Valve: The tricuspid valve is normal in structure. Tricuspid valve regurgitation is mild to moderate. No evidence of tricuspid stenosis.  Aortic Valve: The aortic valve is normal in structure. Aortic valve regurgitation is mild to moderate. Aortic regurgitation PHT measures 387 msec. Aortic valve sclerosis/calcification is present, without any evidence of aortic stenosis.  Pulmonic Valve: The pulmonic valve was normal in structure. Pulmonic valve regurgitation is not visualized. No evidence of pulmonic stenosis.  Aorta: The aortic root is normal in size and structure.  Venous: The inferior vena cava is normal in size with greater than 50% respiratory variability, suggesting right atrial pressure of 3 mmHg.  IAS/Shunts: No atrial level shunt detected by color flow Doppler.   LEFT VENTRICLE PLAX 2D LVIDd:         4.70 cm         Diastology LVIDs:         3.10 cm         LV e' medial:    4.98 cm/s LV PW:         0.90 cm         LV E/e' medial:  9.4 LV IVS:        1.00 cm          LV e' lateral:   7.23 cm/s LVOT diam:     2.20 cm         LV E/e' lateral: 6.5 LV SV:         70 LV SV Index:   35 LVOT Area:     3.80 cm        3D Volume EF LV 3D EF:    Left ventricul ar ejection fraction by 3D volume is 57 %.  3D Volume EF: 3D EF:        57 % LV EDV:       133 ml LV ESV:       58 ml LV SV:        75 ml  RIGHT VENTRICLE RV Basal diam:  3.90 cm RV Mid diam:    4.10 cm RV S prime:     9.02 cm/s TAPSE (M-mode): 1.3 cm  LEFT ATRIUM           Index        RIGHT ATRIUM           Index LA diam:      3.60 cm 1.83 cm/m   RA Area:     16.40 cm LA Vol (A2C): 63.0 ml 32.09 ml/m  RA Volume:   45.70 ml  23.27 ml/m LA Vol (A4C): 43.3 ml 22.05 ml/m AORTIC VALVE LVOT Vmax:   85.40 cm/s LVOT Vmean:  56.200 cm/s LVOT VTI:    0.183 m AI PHT:      387 msec  AORTA Ao Root diam: 3.50 cm Ao Asc diam:  3.40 cm  MITRAL VALVE               TRICUSPID VALVE MV Area (PHT): 2.87 cm    TR Peak grad:   19.0 mmHg MV Decel Time: 264 msec    TR Vmax:  218.00 cm/s MV E velocity: 46.80 cm/s MV A velocity: 84.20 cm/s  SHUNTS MV E/A ratio:  0.56        Systemic VTI:  0.18 m Systemic Diam: 2.20 cm  Kardie Tobb DO Electronically signed by Thomasene Ripple DO Signature Date/Time: 08/26/2023/11:09:55 AM    Final   TEE  ECHO INTRAOPERATIVE TEE 10/12/2019  Narrative *INTRAOPERATIVE TRANSESOPHAGEAL REPORT *    Patient Name:   Fred [name] Date of Exam: 10/12/2019 Medical Rec #:  [id number]       Height:       67.0 in Accession #:    [id number]      Weight:       197.2 lb Date of Birth:  1941/01/04       BSA:          2.01 m Patient Age:    78 years        BP:           130/70 mmHg Patient Gender: M               HR:           65 bpm. Exam Location:  Anesthesiology  Transesophogeal exam was perform intraoperatively during surgical procedure. Patient was closely monitored under general anesthesia during the entirety of examination.  Indications:     I25.110  Atherosclerotic heart disease of native coronary artery with unstable angina pectoris History:         CAD Signs/Symptoms:Chest Pain Risk Factors:Diabetes. Medications:Beta Blockers. Performing Phys: [name] Diagnosing Phys: Leslye Peer MD  Complications: No known complications during this procedure. POST-OP IMPRESSIONS - Left Ventricle: The left ventricle is unchanged from pre-bypass. - Right Ventricle: The right ventricle appears unchanged from pre-bypass. - Aorta: The aorta appears unchanged from pre-bypass. - Left Atrium: The left atrium appears unchanged from pre-bypass. - Left Atrial Appendage: The left atrial appendage appears unchanged from pre-bypass. - Aortic Valve: The aortic valve appears unchanged from pre-bypass. - Mitral Valve: The mitral valve appears unchanged from pre-bypass. - Tricuspid Valve: The tricuspid valve appears unchanged from pre-bypass. - Interatrial Septum: The interatrial septum appears unchanged from pre-bypass. - Interventricular Septum: The interventricular septum appears unchanged from pre-bypass. - Pericardium: The pericardium appears unchanged from pre-bypass.  PRE-OP FINDINGS Left Ventricle: The left ventricle has normal systolic function, with an ejection fraction of 55-60%. The cavity size was normal. There is no increase in left ventricular wall thickness.  Right Ventricle: The right ventricle has normal systolic function. The cavity was normal. There is no increase in right ventricular wall thickness.  Left Atrium: Left atrial size was normal in size. The left atrial appendage is well visualized and there is no evidence of thrombus present.  Right Atrium: Right atrial size was normal in size. Right atrial pressure is estimated at 10 mmHg.  Interatrial Septum: No atrial level shunt detected by color flow Doppler. Increased thickness of the atrial septum sparing the fossa ovalis consistent with The interatrial septum appears to be  lipomatous.  Pericardium: There is no evidence of pericardial effusion.  Mitral Valve: The mitral valve is normal in structure. Mitral valve regurgitation is not visualized by color flow Doppler.  Tricuspid Valve: The tricuspid valve was normal in structure. Tricuspid valve regurgitation is trivial by color flow Doppler.  Aortic Valve: The aortic valve is tricuspid Aortic valve regurgitation is mild by color flow Doppler. There is no evidence of aortic valve stenosis.  Pulmonic Valve: The pulmonic valve was  normal in structure. Pulmonic valve regurgitation is not visualized by color flow Doppler.   Aorta: The aortic root, ascending aorta and aortic arch are normal in size and structure.   Leslye Peer MD Electronically signed by Leslye Peer MD Signature Date/Time: 10/12/2019/1:40:11 PM    Final   MONITORS  LONG TERM MONITOR (3-14 DAYS) 10/12/2019  Narrative 1: Sinus rhythm/sinus bradycardia/sinus tachycardia 2: Occasional PACs and PVCs 3: Occasional runs of PSVT   CT SCANS  CT CORONARY MORPH W/CTA COR W/SCORE 09/21/2019  Addendum 10/02/2019  9:52 AM ADDENDUM REPORT: 10/02/2019 09:50  EXAM: CT FFR ANALYSIS  CLINICAL DATA:  82 year old male with family history of CAD requiring CABG with abnormal coronary CT  FINDINGS: FFRct analysis was performed on the original cardiac CT angiogram dataset. Diagrammatic representation of the FFRct analysis is provided in a separate PDF document in PACS. This dictation was created using the PDF document and an interactive 3D model of the results. 3D model is not available in the EMR/PACS. Normal FFR range is >0.80.  1. Left Main:  No significant stenosis.  2. LAD: There is significant flow limiting stenosis (FFR 0.68-0.64) approximately 8 mm distal to moderate sized first diagonal branch. 3. LCX: FFR is 0.79 (just below normal of 0.8) after the origin of left circumflex. 4. RCA: There is significant flow limiting stenosis of  proximal RCA (FFR 0.66).  IMPRESSION: 1. CT FFR analysis demonstrates significant 3 vessel flow limiting CAD.   Electronically Signed By: Donato Schultz MD On: 10/02/2019 09:50  Addendum 10/01/2019  4:41 PM ADDENDUM REPORT: 10/01/2019 16:39  EXAM: Cardiac/Coronary  CTA  TECHNIQUE: The patient was scanned on a [employer].  FINDINGS: A 100 kV prospective scan was triggered in the descending thoracic aorta at 111 HU's. Axial non-contrast 3 mm slices were carried out through the heart. The data set was analyzed on a dedicated work station and scored using the Agatson method. Gantry rotation speed was 250 msecs and collimation was .6 mm. 0.8 mg of sl NTG was given. The 3D data set was reconstructed in 5% intervals of the 67-82 % of the R-R cycle. Diastolic phases were analyzed on a dedicated work station using MPR, MIP and VRT modes. The patient received 80 cc of contrast.  Aorta:  Normal size.  No calcifications.  No dissection.  Aortic Valve:  Trileaflet.  No calcifications.  Coronary Arteries:  Normal coronary origin.  Right dominance.  RCA is a large dominant artery that gives rise to PDA and PLA. There is dense calcified plaque throughout the proximal to mid vessel with proximal lesion up to 75% just following first area of proximal calcification.  Left main is a large artery that gives rise to LAD and LCX arteries.  LAD is a large vessel that gives rise to moderate size diagonal. There is proximal dense calcified plaque with small caliber mid and distal vessel. Potential severe lesion proximally.  LCX is a non-dominant artery that gives rise to one moderate sized OM1 branch. There is dense calcification of the proximal vessel. Unable to accurately estimate level of stenosis.  Other findings:  3 right sided and 2 left sided pulmonary veins drainage into the left atrium.  Normal left atrial appendage without a thrombus.  Normal size of the  pulmonary artery.  IMPRESSION: 1. Coronary calcium score of 1619. This was 24 percentile for age and sex matched control.  2. Normal coronary origin with right dominance.  3. 3 vessel coronary artery disease with  what appears to be flow limiting stenosis in proximal LAD, RCA. Dense calcified plaque present. Unable to accurately estimate circumflex stenosis due to dense calcification proximally. CAD-RADS -4.  4.  No significant aortic atherosclerosis.  5. Recommend cardiac catheterization but prior to this will send for FFR analysis.   Electronically Signed By: Donato Schultz MD On: 10/01/2019 16:39  Narrative EXAM: OVER-READ INTERPRETATION  CT CHEST  The following report is an over-read performed by radiologist Dr. Trudie Reed of [facility] on 09/21/2019. This over-read does not include interpretation of cardiac or coronary anatomy or pathology. The coronary calcium score/coronary CTA interpretation by the cardiologist is attached.  COMPARISON:  None.  FINDINGS: Within the visualized portions of the thorax there are no suspicious appearing pulmonary nodules or masses, there is no acute consolidative airspace disease, no pleural effusions, no pneumothorax and no lymphadenopathy. Visualized portions of the upper abdomen are unremarkable. There are no aggressive appearing lytic or blastic lesions noted in the visualized portions of the skeleton.  IMPRESSION: 1. No significant incidental noncardiac findings are noted.  Electronically Signed: By: Trudie Reed M.D. On: 09/21/2019 16:22          Risk Assessment/Calculations:            Physical Exam:   VS:  BP 122/67   Pulse 74   Temp (!) 97.4 F (36.3 C)   Resp 17   Ht 5\' 7"  (1.702 m)   Wt 81.6 kg   SpO2 96%   BMI 28.19 kg/m    Wt Readings from Last 3 Encounters:  09/18/23 81.6 kg  09/12/23 84.5 kg  08/22/23 84.6 kg    GEN: Well nourished, well developed in no acute  distress NECK: No JVD; No carotid bruits CARDIAC: RRR, no murmurs, rubs, gallops RESPIRATORY:  Clear to auscultation without rales, wheezing or rhonchi  ABDOMEN: Soft, non-tender, non-distended EXTREMITIES:  No edema; No deformity   ASSESSMENT AND PLAN: .    Abnormal stress test  -Recent PET stress test showed reversible lesion in the mid to distal anterior lateral location.  -Various options has been discussed with the patient, we ultimately recommend proceed with cardiac catheterization for definitive evaluation.  Risk and benefit has been discussed with the patient who is agreeable to proceed.  CAD s/p CABG: On aspirin, metoprolol and simvastatin  Hyperlipidemia: Continue simvastatin  DM2: On insulin    Informed Consent   Shared Decision Making/Informed Consent The risks [stroke (1 in 1000), death (1 in 1000), kidney failure [usually temporary] (1 in 500), bleeding (1 in 200), allergic reaction [possibly serious] (1 in 200)], benefits (diagnostic support and management of coronary artery disease) and alternatives of a cardiac catheterization were discussed in detail with Mr. Agresta and he is willing to proceed.     Dispo: Follow-up in 3 to 4 weeks  Signed, Elder Negus, MD

## 2023-09-18 NOTE — Interval H&P Note (Signed)
History and Physical Interval Note:  09/18/2023 7:41 AM  Fred [name]  has presented today for surgery, with the diagnosis of abnormal stress test.  The various methods of treatment have been discussed with the patient and family. After consideration of risks, benefits and other options for treatment, the patient has consented to  Procedure(s): LEFT HEART CATH AND CORS/GRAFTS ANGIOGRAPHY (N/A) as a surgical intervention.  The patient's history has been reviewed, patient examined, no change in status, stable for surgery.  I have reviewed the patient's chart and labs.  Questions were answered to the patient's satisfaction.     Teri Diltz J Leverne Tessler

## 2023-09-18 NOTE — Discharge Summary (Signed)
Discharge Summary for Same Day PCI   Patient ID: Fred [name] MRN: [id number]; DOB: 1941/01/07  Admit date: 09/18/2023 Discharge date: 09/18/2023  Primary Care Provider: Noberto Retort, MD  Primary Cardiologist: Nanetta Batty, MD  Primary Electrophysiologist:  None   Discharge Diagnoses    Active Problems:   Coronary artery disease  Diagnostic Studies/Procedures    Cardiac Catheterization 09/18/2023:    Mid LM to Dist LM lesion is 80% stenosed.   Ost LAD to Prox LAD lesion is 90% stenosed.   Ost Cx to Prox Cx lesion is 90% stenosed.   Prox RCA lesion is 100% stenosed.   Prox Graft lesion is 95% stenosed.   Prox Graft lesion is 100% stenosed.   A drug-eluting stent was successfully placed using a SYNERGY XD 4.0X12.   Post intervention, there is a 0% residual stenosis.   and is normal in caliber.   and is normal in caliber.   and is normal in caliber.   SVG.   The graft exhibits no disease.   The graft exhibits no disease.  Diagnostic Dominance: Right  Intervention   _____________   History of Present Illness     Fred [name] is a 82 y.o. male with past medical history of dizziness, CAD, hyperlipidemia and DM II.  He has family history of heart disease with mother who had a CABG and a younger brother who died.  Previous heart monitor obtained on 10/12/2019 showed sinus rhythm, occasional PAC and PVCs, occasional run of PSVT.  Coronary CTA obtained in October 2020 demonstrated three-vessel CAD.  Echocardiogram obtained on 10/06/2019 showed EF 55 to 60%, grade 1 DD no significant valve issue.  Subsequent cardiac catheterization performed on 10/11/2019 confirmed the left main and three-vessel disease.  He underwent 4 vessel CABG by Dr. [name] with LIMA to LAD, SVG to ramus, OM and PDA.  He was last seen by Dr. Allyson Sabal in January 2024 at which time he denied any discomfort.  1 year follow-up was recommended.    He was seen by Azalee Course, PA on 08/22/2023 at which  time he described vague chest discomfort.  He complains that he has been noticing the vague chest discomfort more during physical exertion.  The location is similar to the previous angina, however symptom was quite mild.  EKG however showed T wave inversion in the septal lead and new Q waves in the inferior lead.  We proceeded with a PET stress test and echocardiogram.  Echocardiogram obtained on 08/26/2023 showed EF 55 to 60%, no regional wall motion abnormality, grade 1 DD, mild to moderate TR, mild to moderate AI.  PET stress test obtained on 09/09/2023 showed a large defect with moderate reduction in uptake present in the mid to basal anterior lateral location which was reversible, consistent with ischemia, EF 44%.   Patient presented to the office on 10/4 for follow up. He denied any shortness of breath.  He continued to have fatigue with physical exertion and a mild chest comfort. It was recommended that he proceed with outpatient cardiac cath.  Hospital Course     The patient underwent cardiac cath as noted above with PCI/DES x1 SVG-OM. Plan for DAPT with ASA/plavix  for at least 6 months. The patient was seen by cardiac rehab while in short stay. There were no observed complications post cath. Radial cath site was re-evaluated prior to discharge and found to be stable without any complications. Instructions/precautions regarding cath site care were given prior  to discharge.  Fred [name] was seen by Dr. Rosemary Holms and determined stable for discharge home. Follow up with our office has been arranged. Medications are listed below. Pertinent changes include addition of plavix, switched to crestor 20mg  daily.  _____________  Cath/PCI Registry Performance & Quality Measures: Aspirin prescribed? - Yes ADP Receptor Inhibitor (Plavix/Clopidogrel, Brilinta/Ticagrelor or Effient/Prasugrel) prescribed (includes medically managed patients)? - Yes High Intensity Statin (Lipitor 40-80mg  or Crestor 20-40mg )  prescribed? - Yes For EF <40%, was ACEI/ARB prescribed? - Not Applicable (EF >/= 40%) For EF <40%, Aldosterone Antagonist (Spironolactone or Eplerenone) prescribed? - Not Applicable (EF >/= 40%) Cardiac Rehab Phase II ordered (Included Medically managed Patients)? - Yes  _____________  Discharge Vitals Blood pressure 104/61, pulse 70, temperature (!) 97.4 F (36.3 C), resp. rate 11, height 5\' 7"  (1.702 m), weight 81.6 kg, SpO2 95%.  Filed Weights   09/18/23 0541  Weight: 81.6 kg    Last Labs & Radiologic Studies    CBC No results for input(s): "WBC", "NEUTROABS", "HGB", "HCT", "MCV", "PLT" in the last 72 hours. Basic Metabolic Panel No results for input(s): "NA", "K", "CL", "CO2", "GLUCOSE", "BUN", "CREATININE", "CALCIUM", "MG", "PHOS" in the last 72 hours. Liver Function Tests No results for input(s): "AST", "ALT", "ALKPHOS", "BILITOT", "PROT", "ALBUMIN" in the last 72 hours. No results for input(s): "LIPASE", "AMYLASE" in the last 72 hours. High Sensitivity Troponin:   No results for input(s): "TROPONINIHS" in the last 720 hours.  BNP Invalid input(s): "POCBNP" D-Dimer No results for input(s): "DDIMER" in the last 72 hours. Hemoglobin A1C No results for input(s): "HGBA1C" in the last 72 hours. Fasting Lipid Panel No results for input(s): "CHOL", "HDL", "LDLCALC", "TRIG", "CHOLHDL", "LDLDIRECT" in the last 72 hours. Thyroid Function Tests No results for input(s): "TSH", "T4TOTAL", "T3FREE", "THYROIDAB" in the last 72 hours.  Invalid input(s): "FREET3" _____________  CARDIAC CATHETERIZATION  Result Date: 09/18/2023 Images from the original result were not included. Coronary and bypass graft angiography, SVG-OM intervention 09/18/2023: LM: Distal 80% stenosis LAD: Ostial proximal 90% stenosis, mid occlusion Ramus: Small caliber vessel, proximal 50% disease LCx: Proximal 95-90% tandem stenoses RCA: 70% proximal ISR, followed by occlusion LIMA-LAD: Patent                      Mid to distal LAD has mild diffuse disease after LIMA-LAD insertion SVG-PDA: Patent, 20% proximal disease                    Proximal PDA has diffuse 60% disease after SVG-PDA insertion                     SVG-OM: Patent, 95% proximal stenosis                   OM has 50% stenosis after SVG-OM insertion SVG-ramus: Ostially occluded Successful percutaneous coronary intervention SVG-OM     Direct stenting with 4.0 X 12 mm Synergy drug-eluting stent using Spider Fx 5.0 embolic protection     Post dilatation using 4.0 x 8 mm [place] balloon at 14 atm Elder Negus, MD  NM PET CT CARDIAC PERFUSION MULTI W/ABSOLUTE BLOODFLOW  Result Date: 09/09/2023   LV perfusion is abnormal. Defect 1: There is a large defect with moderate reduction in uptake present in the mid to basal anterolateral location(s) that is reversible. There is normal wall motion in the defect area. Consistent with ischemia.   Rest left ventricular function is abnormal. Rest global function  is mildly reduced. There were no regional wall motion abnormalities. Rest EF: 44%. Stress left ventricular function is abnormal. Stress global function is mildly reduced. There were no regional wall abnormalities. Stress EF: 50%. End diastolic cavity size is mildly enlarged. End systolic cavity size is mildly enlarged.   Myocardial blood flow reserve is not reported in this patient due to technical or patient-specific concerns that affect accuracy. Global MBFR abnormal 1.73 but may not be accurate in setting of prior CABG   Coronary calcium assessment not performed due to prior revascularization. Not commented on in setting of prior CABG   Findings are consistent with ischemia. The study is intermediate risk. EXAM: OVER-READ INTERPRETATION  CT CHEST The following report is a limited chest CT over-read performed by radiologist Dr. Irma Newness [facility] on 09/09/2023. This over-read does not include interpretation of cardiac or coronary anatomy or  pathology nor does it include evaluation of the PET data. The cardiac PET-CT interpretation by the cardiologist is attached. COMPARISON:  Chest CT 04/12/2021 FINDINGS: Mediastinum/Nodes: No enlarged lymph nodes within the visualized mediastinum.Aortic atherosclerosis noted post median sternotomy and CABG. Lungs/Pleura: There is no pleural effusion. Mild central airway thickening. The lungs are otherwise clear without confluent airspace disease or suspicious nodularity. Upper abdomen: No significant findings in the visualized upper abdomen. Musculoskeletal/Chest wall: Chronic nonunion of the median sternotomy. The sternotomy wires are intact. No acute osseous findings. IMPRESSION: 1. No acute extracardiac findings. 2. Chronic nonunion of the median sternotomy. 3.  Aortic Atherosclerosis (ICD10-I70.0). Electronically Signed   By: Carey Bullocks M.D.   On: 09/09/2023 11:17  ECHOCARDIOGRAM COMPLETE  Result Date: 08/26/2023    ECHOCARDIOGRAM REPORT   Patient Name:   Fred [name] Date of Exam: 08/26/2023 Medical Rec #:  [id number]       Height:       67.0 in Accession #:    [id number]      Weight:       186.6 lb Date of Birth:  November 14, 1941       BSA:          1.964 m Patient Age:    82 years        BP:           102/52 mmHg Patient Gender: M               HR:           69 bpm. Exam Location:  [address] Procedure: 3D Echo, 2D Echo, Cardiac Doppler and Color Doppler Indications:    Chest Discomfort R07.89  History:        Patient has prior history of Echocardiogram examinations, most                 recent 10/06/2019. CAD, Prior CABG; Risk Factors:Diabetes.  Sonographer:    Thurman Coyer RDCS Referring Phys: [id number] HAO MENG IMPRESSIONS  1. Left ventricular ejection fraction, by estimation, is 55 to 60%. Left ventricular ejection fraction by 3D volume is 57 %. The left ventricle has normal function. The left ventricle has no regional wall motion abnormalities. Left ventricular diastolic  parameters are  consistent with Grade I diastolic dysfunction (impaired relaxation).  2. Right ventricular systolic function is normal. The right ventricular size is mildly enlarged. There is normal pulmonary artery systolic pressure.  3. The mitral valve is normal in structure. No evidence of mitral valve regurgitation. No evidence of mitral stenosis.  4. Tricuspid valve regurgitation is mild to moderate.  5. The aortic valve is normal in structure. Aortic valve regurgitation is mild to moderate. Aortic valve sclerosis/calcification is present, without any evidence of aortic stenosis. Aortic regurgitation PHT measures 387 msec.  6. The inferior vena cava is normal in size with greater than 50% respiratory variability, suggesting right atrial pressure of 3 mmHg. FINDINGS  Left Ventricle: Left ventricular ejection fraction, by estimation, is 55 to 60%. Left ventricular ejection fraction by 3D volume is 57 %. The left ventricle has normal function. The left ventricle has no regional wall motion abnormalities. The left ventricular internal cavity size was normal in size. There is no left ventricular hypertrophy. Left ventricular diastolic parameters are consistent with Grade I diastolic dysfunction (impaired relaxation). Right Ventricle: The right ventricular size is mildly enlarged. No increase in right ventricular wall thickness. Right ventricular systolic function is normal. There is normal pulmonary artery systolic pressure. The tricuspid regurgitant velocity is 2.18  m/s, and with an assumed right atrial pressure of 3 mmHg, the estimated right ventricular systolic pressure is 22.0 mmHg. Left Atrium: Left atrial size was normal in size. Right Atrium: Right atrial size was normal in size. Pericardium: There is no evidence of pericardial effusion. Mitral Valve: The mitral valve is normal in structure. No evidence of mitral valve regurgitation. No evidence of mitral valve stenosis. Tricuspid Valve: The tricuspid valve is normal in  structure. Tricuspid valve regurgitation is mild to moderate. No evidence of tricuspid stenosis. Aortic Valve: The aortic valve is normal in structure. Aortic valve regurgitation is mild to moderate. Aortic regurgitation PHT measures 387 msec. Aortic valve sclerosis/calcification is present, without any evidence of aortic stenosis. Pulmonic Valve: The pulmonic valve was normal in structure. Pulmonic valve regurgitation is not visualized. No evidence of pulmonic stenosis. Aorta: The aortic root is normal in size and structure. Venous: The inferior vena cava is normal in size with greater than 50% respiratory variability, suggesting right atrial pressure of 3 mmHg. IAS/Shunts: No atrial level shunt detected by color flow Doppler.  LEFT VENTRICLE PLAX 2D LVIDd:         4.70 cm         Diastology LVIDs:         3.10 cm         LV e' medial:    4.98 cm/s LV PW:         0.90 cm         LV E/e' medial:  9.4 LV IVS:        1.00 cm         LV e' lateral:   7.23 cm/s LVOT diam:     2.20 cm         LV E/e' lateral: 6.5 LV SV:         70 LV SV Index:   35 LVOT Area:     3.80 cm        3D Volume EF                                LV 3D EF:    Left                                             ventricul  ar                                             ejection                                             fraction                                             by 3D                                             volume is                                             57 %.                                 3D Volume EF:                                3D EF:        57 %                                LV EDV:       133 ml                                LV ESV:       58 ml                                LV SV:        75 ml RIGHT VENTRICLE RV Basal diam:  3.90 cm RV Mid diam:    4.10 cm RV S prime:     9.02 cm/s TAPSE (M-mode): 1.3 cm LEFT ATRIUM           Index        RIGHT ATRIUM           Index LA diam:       3.60 cm 1.83 cm/m   RA Area:     16.40 cm LA Vol (A2C): 63.0 ml 32.09 ml/m  RA Volume:   45.70 ml  23.27 ml/m LA Vol (A4C): 43.3 ml 22.05 ml/m  AORTIC VALVE LVOT Vmax:   85.40 cm/s LVOT Vmean:  56.200 cm/s LVOT VTI:    0.183 m AI PHT:      387 msec  AORTA Ao Root diam: 3.50 cm Ao Asc diam:  3.40 cm MITRAL VALVE               TRICUSPID VALVE MV  Area (PHT): 2.87 cm    TR Peak grad:   19.0 mmHg MV Decel Time: 264 msec    TR Vmax:        218.00 cm/s MV E velocity: 46.80 cm/s MV A velocity: 84.20 cm/s  SHUNTS MV E/A ratio:  0.56        Systemic VTI:  0.18 m                            Systemic Diam: 2.20 cm Kardie Tobb DO Electronically signed by Thomasene Ripple DO Signature Date/Time: 08/26/2023/11:09:55 AM    Final     Disposition   Pt is being discharged home today in good condition.  Follow-up Plans & Appointments     Follow-up Information     Azalee Course, [place] Follow up on 09/26/2023.   Specialties: Cardiology, Radiology Why: at 2:45pm for your follow up appt with cardiology Contact information: [address] [place] [address] [phone]                Discharge Instructions     Amb Referral to Cardiac Rehabilitation   Complete by: As directed    Diagnosis: Coronary Stents   After initial evaluation and assessments completed: Virtual Based Care may be provided alone or in conjunction with Phase 2 Cardiac Rehab based on patient barriers.: Yes   Intensive Cardiac Rehabilitation (ICR) [facility] location only OR Traditional Cardiac Rehabilitation (TCR) *If criteria for ICR are not met will enroll in TCR [facility] only): Yes        Discharge Medications   Allergies as of 09/18/2023   No Known Allergies      Medication List     STOP taking these medications    simvastatin 40 MG tablet Commonly known as: ZOCOR       TAKE these medications    Accu-Chek Aviva device by Other route. Use as instructed 1 time daily   Accu-Chek FastClix Lancets Misc USE 1  TO 4 TIMES DAILY AS  NEEDED/DIRECTED   Accu-Chek Guide test strip Generic drug: glucose blood USE TO CHECK BLOOD SUGAR DAILY  AS DIRECTED   acetaminophen 650 MG CR tablet Commonly known as: TYLENOL Take 1,300 mg by mouth 2 (two) times daily.   aspirin EC 81 MG tablet Take 1 tablet (81 mg total) by mouth daily.   clopidogrel 75 MG tablet Commonly known as: PLAVIX Take 1 tablet (75 mg total) by mouth daily with breakfast. Start taking on: September 19, 2023   clotrimazole 1 % cream Commonly known as: LOTRIMIN Apply 1 application  topically daily as needed (Groin).   dapagliflozin propanediol 10 MG Tabs tablet Commonly known as: Farxiga Take 1 tablet (10 mg total) by mouth daily before breakfast.   Dexcom G6 Sensor Misc by Does not apply route.   Insulin Pen Needle 32G X 4 MM Misc 1 Device by Does not apply route daily.   levothyroxine 25 MCG tablet Commonly known as: SYNTHROID Take 1 tablet (25 mcg total) by mouth daily before breakfast.   metFORMIN 1000 MG tablet Commonly known as: GLUCOPHAGE Take 1 tablet (1,000 mg total) by mouth 2 (two) times daily with a meal.   metoprolol tartrate 25 MG tablet Commonly known as: LOPRESSOR TAKE ONE-HALF TABLET BY MOUTH  TWICE DAILY   multivitamin with minerals tablet Take 1 tablet by mouth daily.   nitroGLYCERIN 0.4 MG SL tablet Commonly known as: NITROSTAT Place 1 tablet (0.4 mg  total) under the tongue every 5 (five) minutes as needed for chest pain.   rosuvastatin 20 MG tablet Commonly known as: Crestor Take 1 tablet (20 mg total) by mouth daily.   Semaglutide (1 MG/DOSE) 4 MG/3ML Sopn Inject 1 mg as directed once a week.   solifenacin 10 MG tablet Commonly known as: VESICARE Take 10 mg by mouth daily.   tamsulosin 0.4 MG Caps capsule Commonly known as: FLOMAX Take 0.4 mg by mouth daily.   Toujeo Max SoloStar 300 UNIT/ML Solostar Pen Generic drug: insulin glargine (2 Unit Dial) Inject 40 Units into the skin  daily.        Allergies No Known Allergies  Outstanding Labs/Studies   FLP/LFTs in 8 weeks  Duration of Discharge Encounter   Greater than 30 minutes including physician time.  Signed, Laverda Page, NP 09/18/2023, 1:46 PM

## 2023-09-18 NOTE — CV Procedure (Signed)
SVG-OM intervention 4.0X12 mm Synergy stent with embolic protection. Full report to follow.  Elder Negus, MD

## 2023-09-19 ENCOUNTER — Encounter (HOSPITAL_COMMUNITY): Payer: Self-pay | Admitting: Cardiology

## 2023-09-23 ENCOUNTER — Telehealth (HOSPITAL_COMMUNITY): Payer: Self-pay

## 2023-09-23 NOTE — Telephone Encounter (Signed)
Attempted to call patient in regards to Cardiac Rehab - LM on VM

## 2023-09-26 ENCOUNTER — Ambulatory Visit: Payer: Medicare Other | Admitting: Physician Assistant

## 2023-09-30 ENCOUNTER — Telehealth (HOSPITAL_COMMUNITY): Payer: Self-pay | Admitting: *Deleted

## 2023-09-30 NOTE — Telephone Encounter (Signed)
Pt left message, returning call from 10/15.  Pt was out of town.Called and left message for pt for interest in participating in CR.  Cardiology follow up is on 11/6. Alanson Aly, BSN Cardiac and Emergency planning/management officer

## 2023-10-01 ENCOUNTER — Telehealth (HOSPITAL_COMMUNITY): Payer: Self-pay

## 2023-10-01 NOTE — Telephone Encounter (Signed)
Patient picked up 3 boxes of Ozempic

## 2023-10-01 NOTE — Telephone Encounter (Signed)
Pt called and he is interested in the Cardiac Rehab Program. Patient expressed interest. Explained scheduling process and went over insurance, patient verbalized understanding. Someone from our cardiac rehab staff will contact pt at a later time.

## 2023-10-08 ENCOUNTER — Telehealth: Payer: Self-pay | Admitting: Cardiovascular Disease

## 2023-10-08 NOTE — Telephone Encounter (Signed)
   Pre-operative Risk Assessment    Patient Name: Fred [name]  DOB: 08/05/1941 MRN: [id number]     Request for Surgical Clearance    Procedure:  Dental Teeth Claning  for all of his teet  Date of Surgery:  10-09-23                              Surgeon:  Dr Kaylyn Layer, DDS Surgeon's Group or Practice Name:   Phone number:  [phone] Fax number:  [phone]- not in EPIC   Type of Clearance Requested:   - Medical -patient had stent put in 09-18-23, is this too soon to have his teeth cleaned  Medicine- does he need an antibiotic before his cleaning   Type of Anesthesia:  None    Additional requests/questions:    Signed, Laurence Ferrari   10/08/2023, 10:35 AM

## 2023-10-08 NOTE — Telephone Encounter (Signed)
   Patient Name: KEYNON THUL  DOB: 28-Jul-1941 MRN: [id number]  Primary Cardiologist: Nanetta Batty, MD  Chart reviewed as part of pre-operative protocol coverage.   Simple dental extractions (i.e. 1-2 teeth) are considered low risk procedures per guidelines and generally do not require any specific cardiac clearance. It is also generally accepted that for simple extractions and dental cleanings, there is no need to interrupt blood thinner therapy.  SBE prophylaxis is not required for the patient from a cardiac standpoint.  I will route this recommendation to the requesting party via Epic fax function and remove from pre-op pool.  Please call with questions.  Napoleon Form, Leodis Rains, NP 10/08/2023, 10:47 AM

## 2023-10-15 ENCOUNTER — Encounter: Payer: Self-pay | Admitting: Physician Assistant

## 2023-10-15 ENCOUNTER — Ambulatory Visit: Payer: Medicare Other | Attending: Physician Assistant | Admitting: Physician Assistant

## 2023-10-15 VITALS — BP 104/52 | HR 72 | Ht 67.0 in | Wt 187.0 lb

## 2023-10-15 DIAGNOSIS — I2510 Atherosclerotic heart disease of native coronary artery without angina pectoris: Secondary | ICD-10-CM

## 2023-10-15 DIAGNOSIS — E119 Type 2 diabetes mellitus without complications: Secondary | ICD-10-CM

## 2023-10-15 DIAGNOSIS — E785 Hyperlipidemia, unspecified: Secondary | ICD-10-CM

## 2023-10-15 DIAGNOSIS — Z79899 Other long term (current) drug therapy: Secondary | ICD-10-CM

## 2023-10-15 MED ORDER — ROSUVASTATIN CALCIUM 20 MG PO TABS: 20.0000 mg | ORAL_TABLET | Freq: Every day | ORAL | 3 refills | Status: DC

## 2023-10-15 MED ORDER — CLOPIDOGREL BISULFATE 75 MG PO TABS: 75.0000 mg | ORAL_TABLET | Freq: Every day | ORAL | 3 refills | Status: DC

## 2023-10-15 NOTE — Patient Instructions (Signed)
Medication Instructions:  NO CHANGES *If you need a refill on your cardiac medications before your next appointment, please call your pharmacy*   Lab Work: FASTING LIPIDS AND LFT IN 6 WEEKS If you have labs (blood work) drawn today and your tests are completely normal, you will receive your results only by: MyChart Message (if you have MyChart) OR A paper copy in the mail If you have any lab test that is abnormal or we need to change your treatment, we will call you to review the results.   Testing/Procedures: NO TESTING   Follow-Up: At [facility], you and your health needs are our priority.  As part of our continuing mission to provide you with exceptional heart care, we have created designated Provider Care Teams.  These Care Teams include your primary Cardiologist (physician) and Advanced Practice Providers (APPs -  Physician Assistants and Nurse Practitioners) who all work together to provide you with the care you need, when you need it.    Your next appointment:   KEEP UPCOMING FOLLOW UP APPOINTMENT JANUARY 2025  Provider:   Nanetta Batty, MD

## 2023-10-15 NOTE — Progress Notes (Unsigned)
Cardiology Office Note:  .   Date:  10/16/2023  ID:  [name], DOB 1941/12/03, MRN [id number] PCP: Noberto Retort, MD  [facility] Providers Cardiologist:  Nanetta Batty, MD     History of Present Illness: .   CHIDERA THIVIERGE is a 82 y.o. male with past medical history of dizziness, CAD, hyperlipidemia and DM II.  He has family history of heart disease with mother who had a CABG and a younger brother who died.  Previous heart monitor obtained on 10/12/2019 showed sinus rhythm, occasional PAC and PVCs, occasional run of PSVT.  Coronary CTA obtained in October 2020 demonstrated three-vessel CAD.  Echocardiogram obtained on 10/06/2019 showed EF 55 to 60%, grade 1 DD no significant valve issue.  Subsequent cardiac catheterization performed on 10/11/2019 confirmed the left main and three-vessel disease.  He underwent 4 vessel CABG by Dr. [name] with LIMA to LAD, SVG to ramus, OM and PDA.  He was last seen by Dr. Allyson Sabal in January 2024 at which time he denied any discomfort.  1 year follow-up was recommended.    I saw the patient on 08/22/2023 at which time he described vague chest discomfort.  He complains that he has been noticing the vague chest discomfort more during physical exertion.  The location is similar to the previous angina, however symptom was quite mild.  EKG however showed T wave inversion in the septal lead and new Q waves in the inferior lead.  We proceeded with a PET stress test and echocardiogram.  Echocardiogram obtained on 08/26/2023 showed EF 55 to 60%, no regional wall motion abnormality, grade 1 DD, mild to moderate TR, mild to moderate AI.  PET stress test obtained on 09/09/2023 showed a large defect with moderate reduction in uptake present in the mid to basal anterior lateral location which was reversible, consistent with ischemia, EF 44%.  The patient for cardiac catheterization which was performed on 09/18/2023.  Cardiac catheter station revealed 80% distal left  main lesion, 90% ostial proximal LAD lesion, 50% ramus lesion, 95% proximal left circumflex lesion, 70% proximal RCA lesion, patent LIMA to LAD, patent SVG to PDA was diffuse 60% disease in proximal PDA after the vein graft insertion, ostially occluded SVG to ramus, 95% proximal SVG to OM lesion treated with 4.0 x 12 mm Synergy DES.  Patient presents today for follow-up.  Patient presents today for follow-up.  He denies any recurrent chest discomfort since the last PCI.  He had his cath report with him and was quite concerned about the amount of blockage.  I went into detail with his recent cath.  He was discharged on higher dose of Crestor and Plavix.  He request both medication to be sent to [facility].  He is due for fasting lipid panel and LFT in 6 weeks.  He has follow-up already scheduled with Dr. Allyson Sabal in January.  Otherwise, he has no shortness of breath, lower extremity edema, orthopnea.  ROS:   He denies chest pain, palpitations, dyspnea, pnd, orthopnea, n, v, dizziness, syncope, edema, weight gain, or early satiety. All other systems reviewed and are otherwise negative except as noted above.    Studies Reviewed: .        Cardiac Studies & Procedures   CARDIAC CATHETERIZATION  CARDIAC CATHETERIZATION 09/18/2023  Narrative Images from the original result were not included. Coronary and bypass graft angiography, SVG-OM intervention 09/18/2023: LM: Distal 80% stenosis LAD: Ostial proximal 90% stenosis, mid occlusion Ramus: Small caliber vessel,  proximal 50% disease LCx: Proximal 95-90% tandem stenoses RCA: 70% proximal ISR, followed by occlusion  LIMA-LAD: Patent Mid to distal LAD has mild diffuse disease after LIMA-LAD insertion SVG-PDA: Patent, 20% proximal disease Proximal PDA has diffuse 60% disease after SVG-PDA insertion  SVG-OM: Patent,  20% proximal disease, followed by 95% proximal stenosis OM has 50% stenosis after SVG-OM insertion SVG-ramus: Ostially  occluded  Successful percutaneous coronary intervention SVG-OM Direct stenting with 4.0 X 12 mm Synergy drug-eluting stent using Spider Fx 5.0 embolic protection Post dilatation using 4.0 x 8 mm [place] balloon at 14 atm    Manish Emiliano Dyer, MD  Findings Coronary Findings Diagnostic  Dominance: Right  Left Main Mid LM to Dist LM lesion is 80% stenosed. The lesion is moderately calcified.  Left Anterior Descending Ost LAD to Prox LAD lesion is 90% stenosed. The lesion is calcified.  Left Circumflex Ost Cx to Prox Cx lesion is 95% stenosed. Prox Cx lesion is 90% stenosed.  First Obtuse Marginal Branch 1st Mrg lesion is 50% stenosed.  Right Coronary Artery Ost RCA to Prox RCA lesion is 70% stenosed. The lesion was previously treated over 2 years ago. Prox RCA lesion is 100% stenosed.  Right Posterior Descending Artery RPDA lesion is 60% stenosed.  LIMA Graft To Mid LAD And is normal in caliber.  The graft exhibits no disease.  Graft To RPDA And is normal in caliber.  The graft exhibits no disease. Prox Graft lesion is 20% stenosed.  Graft To 1st Mrg And is normal in caliber. Prox Graft-1 lesion is 20% stenosed. Prox Graft-2 lesion is 95% stenosed.  Saphenous Graft To Ramus SVG. Prox Graft lesion is 100% stenosed.  Intervention  Prox Graft-2 lesion (Graft To 1st Mrg) Stent Pre-stent angioplasty was not performed. A drug-eluting stent was successfully placed. Post-stent angioplasty was performed. Post-Intervention Lesion Assessment The intervention was successful. Embolic protection device was deployed. Pre-interventional TIMI flow is 2. Post-intervention TIMI flow is 3. No complications occurred at this lesion. There is a 0% residual stenosis post intervention.   CARDIAC CATHETERIZATION  CARDIAC CATHETERIZATION 10/11/2019  Narrative Images from the original result were not included.   Mid LM to Dist LM lesion is 80% stenosed.  Ost LAD to Prox LAD lesion  is 90% stenosed.  Ost Cx to Prox Cx lesion is 90% stenosed.  Prox RCA to Mid RCA lesion is 90% stenosed.  DAQUAVION CATALA is a 82 y.o. male   [id number] LOCATION:  FACILITY: [facility] PHYSICIAN: Nanetta Batty, M.D. 1941/05/07   DATE OF PROCEDURE:  10/11/2019  DATE OF DISCHARGE:     CARDIAC CATHETERIZATION    History obtained from chart review.TEVIS DUNAVAN is a 82 y.o.  mildly overweight weight married Caucasian male father of 2, grandfather of 4 grandchildren who is referred by the ER and Dr. Tiburcio Pea for evaluation of dizziness.  He retired in 2000 from working at [employer] in [place].  I last saw him in the office 09/07/2019. His cardiac risk factors include treated diabetes and hyperlipidemia.  He does not smoke.  He does have a family history of heart disease with a mother that had CABG and a younger brother who died as well which was found in the evaluation of syncope.  He is never had a heart attack or stroke.  He denies chest pain or shortness of breath.  He is fairly active and routinely walked 3 miles a day up until COVID-19 when he became more sedentary however recently he  is try to increase his activity.  On 08/30/2019 he went out for his morning run of 3 miles and at the end he felt weak.  When driving back home he thought that he may have passed out but he was dizzy.  He went to the ER where he was nauseated as well.  ER evaluation was unrevealing.  He said he felt a little bit of chest discomfort this morning when coming to the office but otherwise specifically denied the symptoms.  I performed event monitoring which is still processing.  A coronary CTA did show three-vessel disease.  Based on this, I recommended that we proceed with outpatient diagnostic coronary angiography.  Impression Mr. Forstrom has left main/three-vessel disease with preserved LV function by 2D echo.  He has surgical anatomy.  Given the severe nature of his blockage I favor  keeping him for CABG.  T CTS has been consulted.  The sheath was removed and a TR band was placed on the right wrist to achieve patent hemostasis.  The patient left the lab in stable condition.  Nanetta Batty. MD, [facility] 10/11/2019 11:53 AM  Findings Coronary Findings Diagnostic  Dominance: Right  Left Main Mid LM to Dist LM lesion is 80% stenosed. The lesion is moderately calcified.  Left Anterior Descending Ost LAD to Prox LAD lesion is 90% stenosed. The lesion is calcified.  Left Circumflex Ost Cx to Prox Cx lesion is 90% stenosed.  Right Coronary Artery Prox RCA to Mid RCA lesion is 90% stenosed. The lesion is calcified.  Intervention  No interventions have been documented.   STRESS TESTS  NM PET CT CARDIAC PERFUSION MULTI W/ABSOLUTE BLOODFLOW 09/09/2023  Narrative   LV perfusion is abnormal. Defect 1: There is a large defect with moderate reduction in uptake present in the mid to basal anterolateral location(s) that is reversible. There is normal wall motion in the defect area. Consistent with ischemia.   Rest left ventricular function is abnormal. Rest global function is mildly reduced. There were no regional wall motion abnormalities. Rest EF: 44%. Stress left ventricular function is abnormal. Stress global function is mildly reduced. There were no regional wall abnormalities. Stress EF: 50%. End diastolic cavity size is mildly enlarged. End systolic cavity size is mildly enlarged.   Myocardial blood flow reserve is not reported in this patient due to technical or patient-specific concerns that affect accuracy. Global MBFR abnormal 1.73 but may not be accurate in setting of prior CABG   Coronary calcium assessment not performed due to prior revascularization. Not commented on in setting of prior CABG   Findings are consistent with ischemia. The study is intermediate risk.  EXAM: OVER-READ INTERPRETATION  CT CHEST  The following report is a limited chest CT over-read  performed by radiologist Dr. Irma Newness [facility] on 09/09/2023. This over-read does not include interpretation of cardiac or coronary anatomy or pathology nor does it include evaluation of the PET data. The cardiac PET-CT interpretation by the cardiologist is attached.  COMPARISON:  Chest CT 04/12/2021  FINDINGS: Mediastinum/Nodes: No enlarged lymph nodes within the visualized mediastinum.Aortic atherosclerosis noted post median sternotomy and CABG.  Lungs/Pleura: There is no pleural effusion. Mild central airway thickening. The lungs are otherwise clear without confluent airspace disease or suspicious nodularity.  Upper abdomen: No significant findings in the visualized upper abdomen.  Musculoskeletal/Chest wall: Chronic nonunion of the median sternotomy. The sternotomy wires are intact. No acute osseous findings.  IMPRESSION: 1. No acute extracardiac findings. 2. Chronic nonunion of  the median sternotomy. 3.  Aortic Atherosclerosis (ICD10-I70.0).   Electronically Signed By: Carey Bullocks M.D. On: 09/09/2023 11:17   ECHOCARDIOGRAM  ECHOCARDIOGRAM COMPLETE 08/26/2023  Narrative ECHOCARDIOGRAM REPORT    Patient Name:   ISSAI WERLING Date of Exam: 08/26/2023 Medical Rec #:  [id number]       Height:       67.0 in Accession #:    [id number]      Weight:       186.6 lb Date of Birth:  [birth year]       BSA:          1.964 m Patient Age:    82 years        BP:           102/52 mmHg Patient Gender: M               HR:           69 bpm. Exam Location:  [address]  Procedure: 3D Echo, 2D Echo, Cardiac Doppler and Color Doppler  Indications:    Chest Discomfort R07.89  History:        Patient has prior history of Echocardiogram examinations, most recent 10/06/2019. CAD, Prior CABG; Risk Factors:Diabetes.  Sonographer:    Thurman Coyer RDCS Referring Phys: [id number] Tailer Volkert  IMPRESSIONS   1. Left ventricular ejection fraction, by  estimation, is 55 to 60%. Left ventricular ejection fraction by 3D volume is 57 %. The left ventricle has normal function. The left ventricle has no regional wall motion abnormalities. Left ventricular diastolic parameters are consistent with Grade I diastolic dysfunction (impaired relaxation). 2. Right ventricular systolic function is normal. The right ventricular size is mildly enlarged. There is normal pulmonary artery systolic pressure. 3. The mitral valve is normal in structure. No evidence of mitral valve regurgitation. No evidence of mitral stenosis. 4. Tricuspid valve regurgitation is mild to moderate. 5. The aortic valve is normal in structure. Aortic valve regurgitation is mild to moderate. Aortic valve sclerosis/calcification is present, without any evidence of aortic stenosis. Aortic regurgitation PHT measures 387 msec. 6. The inferior vena cava is normal in size with greater than 50% respiratory variability, suggesting right atrial pressure of 3 mmHg.  FINDINGS Left Ventricle: Left ventricular ejection fraction, by estimation, is 55 to 60%. Left ventricular ejection fraction by 3D volume is 57 %. The left ventricle has normal function. The left ventricle has no regional wall motion abnormalities. The left ventricular internal cavity size was normal in size. There is no left ventricular hypertrophy. Left ventricular diastolic parameters are consistent with Grade I diastolic dysfunction (impaired relaxation).  Right Ventricle: The right ventricular size is mildly enlarged. No increase in right ventricular wall thickness. Right ventricular systolic function is normal. There is normal pulmonary artery systolic pressure. The tricuspid regurgitant velocity is 2.18 m/s, and with an assumed right atrial pressure of 3 mmHg, the estimated right ventricular systolic pressure is 22.0 mmHg.  Left Atrium: Left atrial size was normal in size.  Right Atrium: Right atrial size was normal in  size.  Pericardium: There is no evidence of pericardial effusion.  Mitral Valve: The mitral valve is normal in structure. No evidence of mitral valve regurgitation. No evidence of mitral valve stenosis.  Tricuspid Valve: The tricuspid valve is normal in structure. Tricuspid valve regurgitation is mild to moderate. No evidence of tricuspid stenosis.  Aortic Valve: The aortic valve is normal in structure. Aortic valve regurgitation is mild to moderate. Aortic regurgitation PHT  measures 387 msec. Aortic valve sclerosis/calcification is present, without any evidence of aortic stenosis.  Pulmonic Valve: The pulmonic valve was normal in structure. Pulmonic valve regurgitation is not visualized. No evidence of pulmonic stenosis.  Aorta: The aortic root is normal in size and structure.  Venous: The inferior vena cava is normal in size with greater than 50% respiratory variability, suggesting right atrial pressure of 3 mmHg.  IAS/Shunts: No atrial level shunt detected by color flow Doppler.   LEFT VENTRICLE PLAX 2D LVIDd:         4.70 cm         Diastology LVIDs:         3.10 cm         LV e' medial:    4.98 cm/s LV PW:         0.90 cm         LV E/e' medial:  9.4 LV IVS:        1.00 cm         LV e' lateral:   7.23 cm/s LVOT diam:     2.20 cm         LV E/e' lateral: 6.5 LV SV:         70 LV SV Index:   35 LVOT Area:     3.80 cm        3D Volume EF LV 3D EF:    Left ventricul ar ejection fraction by 3D volume is 57 %.  3D Volume EF: 3D EF:        57 % LV EDV:       133 ml LV ESV:       58 ml LV SV:        75 ml  RIGHT VENTRICLE RV Basal diam:  3.90 cm RV Mid diam:    4.10 cm RV S prime:     9.02 cm/s TAPSE (M-mode): 1.3 cm  LEFT ATRIUM           Index        RIGHT ATRIUM           Index LA diam:      3.60 cm 1.83 cm/m   RA Area:     16.40 cm LA Vol (A2C): 63.0 ml 32.09 ml/m  RA Volume:   45.70 ml  23.27 ml/m LA Vol (A4C): 43.3 ml 22.05 ml/m AORTIC VALVE LVOT Vmax:    85.40 cm/s LVOT Vmean:  56.200 cm/s LVOT VTI:    0.183 m AI PHT:      387 msec  AORTA Ao Root diam: 3.50 cm Ao Asc diam:  3.40 cm  MITRAL VALVE               TRICUSPID VALVE MV Area (PHT): 2.87 cm    TR Peak grad:   19.0 mmHg MV Decel Time: 264 msec    TR Vmax:        218.00 cm/s MV E velocity: 46.80 cm/s MV A velocity: 84.20 cm/s  SHUNTS MV E/A ratio:  0.56        Systemic VTI:  0.18 m Systemic Diam: 2.20 cm  Kardie Tobb DO Electronically signed by Thomasene Ripple DO Signature Date/Time: 08/26/2023/11:09:55 AM    Final   TEE  ECHO INTRAOPERATIVE TEE 10/12/2019  Narrative *INTRAOPERATIVE TRANSESOPHAGEAL REPORT *    Patient Name:   KAVARI PARRILLO Date of Exam: 10/12/2019 Medical Rec #:  [id number]       Height:  67.0 in Accession #:    [id number]      Weight:       197.2 lb Date of Birth:  13-Aug-1941       BSA:          2.01 m Patient Age:    78 years        BP:           130/70 mmHg Patient Gender: M               HR:           65 bpm. Exam Location:  Anesthesiology  Transesophogeal exam was perform intraoperatively during surgical procedure. Patient was closely monitored under general anesthesia during the entirety of examination.  Indications:     I25.110 Atherosclerotic heart disease of native coronary artery with unstable angina pectoris History:         CAD Signs/Symptoms:Chest Pain Risk Factors:Diabetes. Medications:Beta Blockers. Performing Phys: [id number] [name] Diagnosing Phys: Leslye Peer MD  Complications: No known complications during this procedure. POST-OP IMPRESSIONS - Left Ventricle: The left ventricle is unchanged from pre-bypass. - Right Ventricle: The right ventricle appears unchanged from pre-bypass. - Aorta: The aorta appears unchanged from pre-bypass. - Left Atrium: The left atrium appears unchanged from pre-bypass. - Left Atrial Appendage: The left atrial appendage appears unchanged from pre-bypass. - Aortic Valve: The aortic  valve appears unchanged from pre-bypass. - Mitral Valve: The mitral valve appears unchanged from pre-bypass. - Tricuspid Valve: The tricuspid valve appears unchanged from pre-bypass. - Interatrial Septum: The interatrial septum appears unchanged from pre-bypass. - Interventricular Septum: The interventricular septum appears unchanged from pre-bypass. - Pericardium: The pericardium appears unchanged from pre-bypass.  PRE-OP FINDINGS Left Ventricle: The left ventricle has normal systolic function, with an ejection fraction of 55-60%. The cavity size was normal. There is no increase in left ventricular wall thickness.  Right Ventricle: The right ventricle has normal systolic function. The cavity was normal. There is no increase in right ventricular wall thickness.  Left Atrium: Left atrial size was normal in size. The left atrial appendage is well visualized and there is no evidence of thrombus present.  Right Atrium: Right atrial size was normal in size. Right atrial pressure is estimated at 10 mmHg.  Interatrial Septum: No atrial level shunt detected by color flow Doppler. Increased thickness of the atrial septum sparing the fossa ovalis consistent with The interatrial septum 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  Pericardium: There is no evidence of pericardial effusion.  Mitral Valve: The mitral valve is normal in structure. Mitral valve regurgitation is not visualized by color flow Doppler.  Tricuspid Valve: The tricuspid valve was normal in structure. Tricuspid valve regurgitation is trivial by color flow Doppler.  Aortic Valve: The aortic valve is tricuspid Aortic valve regurgitation is mild by color flow Doppler. There is no evidence of aortic valve stenosis.  Pulmonic Valve: The pulmonic valve was normal in structure. Pulmonic valve regurgitation is not visualized by color flow Doppler.   Aorta: The aortic root, ascending aorta and aortic arch are normal in size and structure.   Leslye Peer MD Electronically signed by Leslye Peer MD Signature Date/Time: 10/12/2019/1:40:11 PM    Final   MONITORS  LONG TERM MONITOR (3-14 DAYS) 10/12/2019  Narrative 1: Sinus rhythm/sinus bradycardia/sinus tachycardia 2: Occasional PACs and PVCs 3: Occasional runs of PSVT   CT SCANS  CT CORONARY MORPH W/CTA COR W/SCORE 09/21/2019  Addendum 10/02/2019  9:52 AM ADDENDUM REPORT: 10/02/2019 09:50  EXAM: CT FFR ANALYSIS  CLINICAL DATA:  82 year old male with family history of CAD requiring CABG with abnormal coronary CT  FINDINGS: FFRct analysis was performed on the original cardiac CT angiogram dataset. Diagrammatic representation of the FFRct analysis is provided in a separate PDF document in PACS. This dictation was created using the PDF document and an interactive 3D model of the results. 3D model is not available in the EMR/PACS. Normal FFR range is >0.80.  1. Left Main:  No significant stenosis.  2. LAD: There is significant flow limiting stenosis (FFR 0.68-0.64) approximately 8 mm distal to moderate sized first diagonal branch. 3. LCX: FFR is 0.79 (just below normal of 0.8) after the origin of left circumflex. 4. RCA: There is significant flow limiting stenosis of proximal RCA (FFR 0.66).  IMPRESSION: 1. CT FFR analysis demonstrates significant 3 vessel flow limiting CAD.   Electronically Signed By: Donato Schultz MD On: 10/02/2019 09:50  Addendum 10/01/2019  4:41 PM ADDENDUM REPORT: 10/01/2019 16:39  EXAM: Cardiac/Coronary  CTA  TECHNIQUE: The patient was scanned on a [employer].  FINDINGS: A 100 kV prospective scan was triggered in the descending thoracic aorta at 111 HU's. Axial non-contrast 3 mm slices were carried out through the heart. The data set was analyzed on a dedicated work station and scored using the Agatson method. Gantry rotation speed was 250 msecs and collimation was .6 mm. 0.8 mg of sl NTG was given. The 3D data set  was reconstructed in 5% intervals of the 67-82 % of the R-R cycle. Diastolic phases were analyzed on a dedicated work station using MPR, MIP and VRT modes. The patient received 80 cc of contrast.  Aorta:  Normal size.  No calcifications.  No dissection.  Aortic Valve:  Trileaflet.  No calcifications.  Coronary Arteries:  Normal coronary origin.  Right dominance.  RCA is a large dominant artery that gives rise to PDA and PLA. There is dense calcified plaque throughout the proximal to mid vessel with proximal lesion up to 75% just following first area of proximal calcification.  Left main is a large artery that gives rise to LAD and LCX arteries.  LAD is a large vessel that gives rise to moderate size diagonal. There is proximal dense calcified plaque with small caliber mid and distal vessel. Potential severe lesion proximally.  LCX is a non-dominant artery that gives rise to one moderate sized OM1 branch. There is dense calcification of the proximal vessel. Unable to accurately estimate level of stenosis.  Other findings:  3 right sided and 2 left sided pulmonary veins drainage into the left atrium.  Normal left atrial appendage without a thrombus.  Normal size of the pulmonary artery.  IMPRESSION: 1. Coronary calcium score of 1619. This was 33 percentile for age and sex matched control.  2. Normal coronary origin with right dominance.  3. 3 vessel coronary artery disease with what appears to be flow limiting stenosis in proximal LAD, RCA. Dense calcified plaque present. Unable to accurately estimate circumflex stenosis due to dense calcification proximally. CAD-RADS -4.  4.  No significant aortic atherosclerosis.  5. Recommend cardiac catheterization but prior to this will send for FFR analysis.   Electronically Signed By: Donato Schultz MD On: 10/01/2019 16:39  Narrative EXAM: OVER-READ INTERPRETATION  CT CHEST  The following report is an over-read performed  by radiologist Dr. Trudie Reed of [facility] on 09/21/2019. This over-read does not include interpretation of cardiac or coronary anatomy  or pathology. The coronary calcium score/coronary CTA interpretation by the cardiologist is attached.  COMPARISON:  None.  FINDINGS: Within the visualized portions of the thorax there are no suspicious appearing pulmonary nodules or masses, there is no acute consolidative airspace disease, no pleural effusions, no pneumothorax and no lymphadenopathy. Visualized portions of the upper abdomen are unremarkable. There are no aggressive appearing lytic or blastic lesions noted in the visualized portions of the skeleton.  IMPRESSION: 1. No significant incidental noncardiac findings are noted.  Electronically Signed: By: Trudie Reed M.D. On: 09/21/2019 16:22          Risk Assessment/Calculations:             Physical Exam:   VS:  BP (!) 104/52 (BP Location: Left Arm, Patient Position: Sitting, Cuff Size: Normal)   Pulse 72   Ht 5\' 7"  (1.702 m)   Wt 187 lb (84.8 kg)   SpO2 94%   BMI 29.29 kg/m    Wt Readings from Last 3 Encounters:  10/15/23 187 lb (84.8 kg)  09/18/23 180 lb (81.6 kg)  09/12/23 186 lb 3.2 oz (84.5 kg)    GEN: Well nourished, well developed in no acute distress NECK: No JVD; No carotid bruits CARDIAC: RRR, no murmurs, rubs, gallops RESPIRATORY:  Clear to auscultation without rales, wheezing or rhonchi  ABDOMEN: Soft, non-tender, non-distended EXTREMITIES:  No edema; No deformity   ASSESSMENT AND PLAN: .     Coronary Artery Disease Recent cardiac catheterization revealed significant disease in native vessels and grafts. 80% distal left main lesion, 90% osteoproximal LAD lesion, 50% ramus lesion, 95% proximal left circumflex lesion, 70% proximal RCA lesion. Patent LIMA to LAD, patent SVG to PDA with diffuse 60% disease in proximal PDA after the vein graft insertion, occluded SVG to ramus, 95%  proximal SVG-OM lesion treated with 4.0 by 12mm Synergy DES. -Continue aspirin and Plavix for a minimum of 6 months, ideally one year, to prevent stent occlusion. -Continue Farxiga and metoprolol. -Return in 6 weeks for fasting lipid panel and liver function test to ensure cholesterol control. -Return in 3-4 months for follow-up with Dr. Allyson Sabal.  Hyperlipidemia Last year's cholesterol levels were well controlled, but triglycerides were high. -Continue rosuvastatin. -Check fasting lipid panel and liver function test in 6 weeks.  DM2: Managed by primary care provider    Cardiac Rehabilitation Eligibility Assessment  The patient is ready to start cardiac rehabilitation from a cardiac standpoint.       Dispo: Follow-up with Dr. Allyson Sabal in January  Signed, Azalee Course, [place]

## 2023-10-17 ENCOUNTER — Telehealth (HOSPITAL_COMMUNITY): Payer: Self-pay

## 2023-10-17 NOTE — Telephone Encounter (Signed)
Pt insurance is active and benefits verified through [facility]. Co-pay $0.00, DED $190.00/$190.00 met, out of pocket $3,090.00/$1,497.90 met, co-insurance 20%. No pre-authorization required. Ree Kida A./[employer], 10/17/23 @ 2:48PM, YQM#[id number]   How many CR sessions are covered? (36 visits for TCR, 72 visits for ICR)72 Is this a lifetime maximum or an annual maximum? Annual Has the member used any of these services to date? No Is there a time limit (weeks/months) on start of program and/or program completion? No

## 2023-10-17 NOTE — Telephone Encounter (Signed)
Called patient to see if he was interested in participating in the Cardiac Rehab Program. Patient stated yes. Patient will come in for orientation on 10/22/23 @ 10:30AM and will attend the 11:45AM exercise class. went over insurance, patient verbalized understanding.    Pensions consultant.

## 2023-10-21 ENCOUNTER — Telehealth (HOSPITAL_COMMUNITY): Payer: Self-pay

## 2023-10-21 NOTE — Telephone Encounter (Signed)
    Called patient to confirm Cardiac Rehab appointment tomorrow. RN completed Nursing Assessment with patient. Pt denies s/s of COVID. Directions and instructions provided for the appointment. Pt understands without assistance.

## 2023-10-22 ENCOUNTER — Encounter (HOSPITAL_COMMUNITY)
Admission: RE | Admit: 2023-10-22 | Discharge: 2023-10-22 | Disposition: A | Discharge status: Home / Self Care | Payer: Medicare Other | Source: Ambulatory Visit | Attending: Cardiovascular Disease | Admitting: Cardiovascular Disease

## 2023-10-22 VITALS — BP 102/60 | HR 72 | Ht 67.0 in | Wt 186.9 lb

## 2023-10-22 DIAGNOSIS — Z48812 Encounter for surgical aftercare following surgery on the circulatory system: Secondary | ICD-10-CM | POA: Insufficient documentation

## 2023-10-22 DIAGNOSIS — I252 Old myocardial infarction: Secondary | ICD-10-CM | POA: Diagnosis not present

## 2023-10-22 DIAGNOSIS — Z955 Presence of coronary angioplasty implant and graft: Secondary | ICD-10-CM

## 2023-10-22 LAB — GLUCOSE, CAPILLARY: Glucose-Capillary: 159 mg/dL — ABNORMAL HIGH (ref 70–99)

## 2023-10-22 NOTE — Progress Notes (Signed)
Cardiac Individual Treatment Plan  Patient Details  Name: Fred [name] MRN: [id number] Date of Birth: 29-May-1941 Referring Provider:   Flowsheet Row INTENSIVE CARDIAC REHAB ORIENT from 10/22/2023 in [facility]  Referring Provider Nanetta Batty, MD       Initial Encounter Date:  Flowsheet Row INTENSIVE CARDIAC REHAB ORIENT from 10/22/2023 in [facility]  Date 10/22/23       Visit Diagnosis: 09/18/23 DES SVG-OM  Patient's Home Medications on Admission:  Current Outpatient Medications:    Accu-Chek FastClix Lancets MISC, USE 1 TO 4 TIMES DAILY AS  NEEDED/DIRECTED, Disp: 408 each, Rfl: 3   ACCU-CHEK GUIDE test strip, USE TO CHECK BLOOD SUGAR DAILY  AS DIRECTED, Disp: 100 strip, Rfl: 10   acetaminophen (TYLENOL) 650 MG CR tablet, Take 1,300 mg by mouth 2 (two) times daily., Disp: , Rfl:    aspirin 81 MG tablet, Take 1 tablet (81 mg total) by mouth daily., Disp: , Rfl:    Blood Glucose Monitoring Suppl (ACCU-CHEK AVIVA) device, by Other route. Use as instructed 1 time daily, Disp: , Rfl:    clopidogrel (PLAVIX) 75 MG tablet, Take 1 tablet (75 mg total) by mouth daily with breakfast., Disp: 90 tablet, Rfl: 3   Continuous Glucose Sensor (DEXCOM G6 SENSOR) MISC, by Does not apply route., Disp: , Rfl:    dapagliflozin propanediol (FARXIGA) 10 MG TABS tablet, Take 1 tablet (10 mg total) by mouth daily before breakfast., Disp: 90 tablet, Rfl: 3   Insulin Pen Needle 32G X 4 MM MISC, 1 Device by Does not apply route daily., Disp: 100 each, Rfl: 3   levothyroxine (SYNTHROID) 25 MCG tablet, Take 1 tablet (25 mcg total) by mouth daily before breakfast., Disp: 90 tablet, Rfl: 3   metFORMIN (GLUCOPHAGE) 1000 MG tablet, Take 1 tablet (1,000 mg total) by mouth 2 (two) times daily with a meal., Disp: 180 tablet, Rfl: 3   metoprolol tartrate (LOPRESSOR) 25 MG tablet, TAKE ONE-HALF  TABLET BY MOUTH  TWICE DAILY, Disp: 90 tablet, Rfl: 3   Multiple Vitamins-Minerals (MULTIVITAMIN WITH MINERALS) tablet, Take 1 tablet by mouth daily., Disp: , Rfl:    nitroGLYCERIN (NITROSTAT) 0.4 MG SL tablet, Place 1 tablet (0.4 mg total) under the tongue every 5 (five) minutes as needed for chest pain., Disp: 25 tablet, Rfl: 3   rosuvastatin (CRESTOR) 20 MG tablet, Take 1 tablet (20 mg total) by mouth daily., Disp: 90 tablet, Rfl: 3   Semaglutide, 1 MG/DOSE, 4 MG/3ML SOPN, Inject 1 mg as directed once a week., Disp: 9 mL, Rfl: 3   solifenacin (VESICARE) 10 MG tablet, Take 10 mg by mouth daily., Disp: , Rfl:    tamsulosin (FLOMAX) 0.4 MG CAPS capsule, Take 0.4 mg by mouth daily., Disp: , Rfl:    terbinafine (LAMISIL) 250 MG tablet, Take 250 mg by mouth daily., Disp: , Rfl:    TOUJEO MAX SOLOSTAR 300 UNIT/ML Solostar Pen, Inject 40 Units into the skin daily., Disp: 15 mL, Rfl: 6   clotrimazole (LOTRIMIN) 1 % cream, Apply 1 application  topically daily as needed (Groin). (Patient not taking: Reported on 10/15/2023), Disp: , Rfl:   Past Medical History: Past Medical History:  Diagnosis Date   Coronary artery disease    Diabetes mellitus without complication (HCC)    High cholesterol    Vertigo     Tobacco Use: Social History   Tobacco Use  Smoking Status Former   Current packs/day: 0.00   Types: Cigarettes   Start date: 12/1952   Quit date: 12/1962   Years since quitting: 60.9  Smokeless Tobacco Never    Labs: Review Flowsheet  More data exists      Latest Ref Rng & Units 10/04/2021 02/04/2022 06/03/2022 12/04/2022 06/05/2023  Labs for ITP Cardiac and Pulmonary Rehab  Cholestrol 0 - 200 mg/dL - - - 161  -  Direct LDL mg/dL - - - 09.6  -  HDL-C >04.54 mg/dL - - - 09.81  -  Trlycerides 0.0 - 149.0 mg/dL - - - 191.4  -  Hemoglobin A1c 4.0 - 5.6 % 8.0  9.5  8.1  8.5  7.3     Details            Capillary Blood Glucose: Lab Results  Component Value Date   GLUCAP 159 (H)  10/22/2023   GLUCAP 105 (H) 09/18/2023   GLUCAP 137 (H) 09/18/2023   GLUCAP 210 (H) 02/09/2020   GLUCAP 158 (H) 02/07/2020     Exercise Target Goals: Exercise Program Goal: Individual exercise prescription set using results from initial 6 min walk test and THRR while considering  patient's activity barriers and safety.   Exercise Prescription Goal: Initial exercise prescription builds to 30-45 minutes a day of aerobic activity, 2-3 days per week.  Home exercise guidelines will be given to patient during program as part of exercise prescription that the participant will acknowledge.  Activity Barriers & Risk Stratification:  Activity Barriers & Cardiac Risk Stratification - 10/22/23 1108       Activity Barriers & Cardiac Risk Stratification   Activity Barriers Arthritis;Balance Concerns;Deconditioning;Joint Problems    Cardiac Risk Stratification High   <5 METs on            6 Minute Walk:  6 Minute Walk     Row Name 10/22/23 1214         6 Minute Walk   Phase Initial     Distance 1350 feet     Walk Time 6 minutes     # of Rest Breaks 0     MPH 2.56     METS 1.89     RPE 11     Perceived Dyspnea  0     VO2 Peak 6.61     Symptoms No     Resting HR 72 bpm     Resting BP 102/60     Resting Oxygen Saturation  98 %     Exercise Oxygen Saturation  during 6 min walk 98 %     Max Ex. HR 81 bpm     Max Ex. BP 114/62     2 Minute Post BP 100/54              Oxygen Initial Assessment:   Oxygen Re-Evaluation:   Oxygen Discharge (Final Oxygen Re-Evaluation):   Initial Exercise Prescription:  Initial Exercise Prescription - 10/22/23 1200       Date of Initial Exercise RX and Referring Provider   Date 10/22/23    Referring Provider Nanetta Batty, MD    Expected Discharge Date 01/14/24      NuStep   Level 1    SPM 70    Minutes 15    METs 2      Track   Laps 16    Minutes 15    METs 2      Prescription Details   Frequency (times  per  week) 3    Duration Progress to 30 minutes of continuous aerobic without signs/symptoms of physical distress      Intensity   THRR 40-80% of Max Heartrate 55-110    Ratings of Perceived Exertion 11-13    Perceived Dyspnea 0-4      Progression   Progression Continue to progress workloads to maintain intensity without signs/symptoms of physical distress.      Resistance Training   Training Prescription Yes    Weight 3    Reps 10-15             Perform Capillary Blood Glucose checks as needed.  Exercise Prescription Changes:   Exercise Comments:   Exercise Goals and Review:   Exercise Goals     Row Name 10/22/23 1111             Exercise Goals   Increase Physical Activity Yes       Intervention Provide advice, education, support and counseling about physical activity/exercise needs.;Develop an individualized exercise prescription for aerobic and resistive training based on initial evaluation findings, risk stratification, comorbidities and participant's personal goals.       Expected Outcomes Short Term: Attend rehab on a regular basis to increase amount of physical activity.;Long Term: Exercising regularly at least 3-5 days a week.;Long Term: Add in home exercise to make exercise part of routine and to increase amount of physical activity.       Increase Strength and Stamina Yes       Intervention Provide advice, education, support and counseling about physical activity/exercise needs.;Develop an individualized exercise prescription for aerobic and resistive training based on initial evaluation findings, risk stratification, comorbidities and participant's personal goals.       Expected Outcomes Long Term: Improve cardiorespiratory fitness, muscular endurance and strength as measured by increased METs and functional capacity ( );Short Term: Increase workloads from initial exercise prescription for resistance, speed, and METs.;Short Term: Perform resistance training  exercises routinely during rehab and add in resistance training at home       Able to understand and use rate of perceived exertion (RPE) scale Yes       Intervention Provide education and explanation on how to use RPE scale       Expected Outcomes Short Term: Able to use RPE daily in rehab to express subjective intensity level;Long Term:  Able to use RPE to guide intensity level when exercising independently       Knowledge and understanding of Target Heart Rate Range (THRR) Yes       Intervention Provide education and explanation of THRR including how the numbers were predicted and where they are located for reference       Expected Outcomes Short Term: Able to state/look up THRR;Short Term: Able to use daily as guideline for intensity in rehab;Long Term: Able to use THRR to govern intensity when exercising independently       Understanding of Exercise Prescription Yes       Intervention Provide education, explanation, and written materials on patient's individual exercise prescription       Expected Outcomes Short Term: Able to explain program exercise prescription;Long Term: Able to explain home exercise prescription to exercise independently                Exercise Goals Re-Evaluation :   Discharge Exercise Prescription (Final Exercise Prescription Changes):   Nutrition:  Target Goals: Understanding of nutrition guidelines, daily intake of sodium 1500mg , cholesterol 200mg , calories 30% from fat and  7% or less from saturated fats, daily to have 5 or more servings of fruits and vegetables.  Biometrics:  Pre Biometrics - 10/22/23 1038       Pre Biometrics   Waist Circumference 44 inches    Hip Circumference 43.5 inches    Waist to Hip Ratio 1.01 %    Triceps Skinfold 8 mm    % Body Fat 28.2 %    Grip Strength 28 kg    Flexibility 0 in   could not reach   Single Leg Stand 12.87 seconds              Nutrition Therapy Plan and Nutrition Goals:   Nutrition  Assessments:  MEDIFICTS Score Key: >=70 Need to make dietary changes  40-70 Heart Healthy Diet <= 40 Therapeutic Level Cholesterol Diet    Picture Your Plate Scores: <01 Unhealthy dietary pattern with much room for improvement. 41-50 Dietary pattern unlikely to meet recommendations for good health and room for improvement. 51-60 More healthful dietary pattern, with some room for improvement.  >60 Healthy dietary pattern, although there may be some specific behaviors that could be improved.    Nutrition Goals Re-Evaluation:   Nutrition Goals Re-Evaluation:   Nutrition Goals Discharge (Final Nutrition Goals Re-Evaluation):   Psychosocial: Target Goals: Acknowledge presence or absence of significant depression and/or stress, maximize coping skills, provide positive support system. Participant is able to verbalize types and ability to use techniques and skills needed for reducing stress and depression.  Initial Review & Psychosocial Screening:  Initial Psych Review & Screening - 10/22/23 1112       Initial Review   Current issues with None Identified      Family Dynamics   Good Support System? Yes   Yitzchok has his daughters for support     Barriers   Psychosocial barriers to participate in program There are no identifiable barriers or psychosocial needs.      Screening Interventions   Interventions Encouraged to exercise;Provide feedback about the scores to participant    Expected Outcomes Short Term goal: Identification and review with participant of any Quality of Life or Depression concerns found by scoring the questionnaire.;Long Term goal: The participant improves quality of Life and PHQ9 Scores as seen by post scores and/or verbalization of changes             Quality of Life Scores:  Quality of Life - 10/22/23 1122       Quality of Life   Select Quality of Life      Quality of Life Scores   Health/Function Pre 27.43 %    Socioeconomic Pre 26.25 %     Psych/Spiritual Pre 29.64 %    Family Pre 28 %    GLOBAL Pre 27.77 %            Scores of 19 and below usually indicate a poorer quality of life in these areas.  A difference of  2-3 points is a clinically meaningful difference.  A difference of 2-3 points in the total score of the Quality of Life Index has been associated with significant improvement in overall quality of life, self-image, physical symptoms, and general health in studies assessing change in quality of life.  PHQ-9: Review Flowsheet       10/22/2023 03/20/2020 01/06/2020  Depression screen PHQ 2/9  Decreased Interest 0 0 0  Down, Depressed, Hopeless 0 0 0  PHQ - 2 Score 0 0 0  Altered sleeping 0 - -  Tired, decreased energy 0 - -  Change in appetite 0 - -  Feeling bad or failure about yourself  0 - -  Trouble concentrating 0 - -  Moving slowly or fidgety/restless 0 - -  Suicidal thoughts 0 - -  PHQ-9 Score 0 - -  Difficult doing work/chores Not difficult at all - -    Details           Interpretation of Total Score  Total Score Depression Severity:  1-4 = Minimal depression, 5-9 = Mild depression, 10-14 = Moderate depression, 15-19 = Moderately severe depression, 20-27 = Severe depression   Psychosocial Evaluation and Intervention:   Psychosocial Re-Evaluation:   Psychosocial Discharge (Final Psychosocial Re-Evaluation):   Vocational Rehabilitation: Provide vocational rehab assistance to qualifying candidates.   Vocational Rehab Evaluation & Intervention:  Vocational Rehab - 10/22/23 1114       Initial Vocational Rehab Evaluation & Intervention   Assessment shows need for Vocational Rehabilitation No   Kaamil is retired            Education: Education Goals: Education classes will be provided on a weekly basis, covering required topics. Participant will state understanding/return demonstration of topics presented.     Core Videos: Exercise    Move It!  Clinical staff conducted  group or individual video education with verbal and written material and guidebook.  Patient learns the recommended Pritikin exercise program. Exercise with the goal of living a long, healthy life. Some of the health benefits of exercise include controlled diabetes, healthier blood pressure levels, improved cholesterol levels, improved heart and lung capacity, improved sleep, and better body composition. Everyone should speak with their doctor before starting or changing an exercise routine.  Biomechanical Limitations Clinical staff conducted group or individual video education with verbal and written material and guidebook.  Patient learns how biomechanical limitations can impact exercise and how we can mitigate and possibly overcome limitations to have an impactful and balanced exercise routine.  Body Composition Clinical staff conducted group or individual video education with verbal and written material and guidebook.  Patient learns that body composition (ratio of muscle mass to fat mass) is a key component to assessing overall fitness, rather than body weight alone. Increased fat mass, especially visceral belly fat, can put [place] at increased risk for metabolic syndrome, type 2 diabetes, heart disease, and even death. It is recommended to combine diet and exercise (cardiovascular and resistance training) to improve your body composition. Seek guidance from your physician and exercise physiologist before implementing an exercise routine.  Exercise Action Plan Clinical staff conducted group or individual video education with verbal and written material and guidebook.  Patient learns the recommended strategies to achieve and enjoy long-term exercise adherence, including variety, self-motivation, self-efficacy, and positive decision making. Benefits of exercise include fitness, good health, weight management, more energy, better sleep, less stress, and overall well-being.  Medical   Heart Disease Risk  Reduction Clinical staff conducted group or individual video education with verbal and written material and guidebook.  Patient learns our heart is our most vital organ as it circulates oxygen, nutrients, white blood cells, and hormones throughout the entire body, and carries waste away. Data supports a plant-based eating plan like the Pritikin Program for its effectiveness in slowing progression of and reversing heart disease. The video provides a number of recommendations to address heart disease.   Metabolic Syndrome and Belly Fat  Clinical staff conducted group or individual video education with verbal and written material and  guidebook.  Patient learns what metabolic syndrome is, how it leads to heart disease, and how one can reverse it and keep it from coming back. You have metabolic syndrome if you have 3 of the following 5 criteria: abdominal obesity, high blood pressure, high triglycerides, low HDL cholesterol, and high blood sugar.  Hypertension and Heart Disease Clinical staff conducted group or individual video education with verbal and written material and guidebook.  Patient learns that high blood pressure, or hypertension, is very common in the [place]. Hypertension is largely due to excessive salt intake, but other important risk factors include being overweight, physical inactivity, drinking too much alcohol, smoking, and not eating enough potassium from fruits and vegetables. High blood pressure is a leading risk factor for heart attack, stroke, congestive heart failure, dementia, kidney failure, and premature death. Long-term effects of excessive salt intake include stiffening of the arteries and thickening of heart muscle and organ damage. Recommendations include ways to reduce hypertension and the risk of heart disease.  Diseases of Our Time - Focusing on Diabetes Clinical staff conducted group or individual video education with verbal and written material and guidebook.   Patient learns why the best way to stop diseases of our time is prevention, through food and other lifestyle changes. Medicine (such as prescription pills and surgeries) is often only a Band-Aid on the problem, not a long-term solution. Most common diseases of our time include obesity, type 2 diabetes, hypertension, heart disease, and cancer. The Pritikin Program is recommended and has been proven to help reduce, reverse, and/or prevent the damaging effects of metabolic syndrome.  Nutrition   Overview of the Pritikin Eating Plan  Clinical staff conducted group or individual video education with verbal and written material and guidebook.  Patient learns about the Pritikin Eating Plan for disease risk reduction. The Pritikin Eating Plan emphasizes a wide variety of unrefined, minimally-processed carbohydrates, like fruits, vegetables, whole grains, and legumes. Go, Caution, and Stop food choices are explained. Plant-based and lean animal proteins are emphasized. Rationale provided for low sodium intake for blood pressure control, low added sugars for blood sugar stabilization, and low added fats and oils for coronary artery disease risk reduction and weight management.  Calorie Density  Clinical staff conducted group or individual video education with verbal and written material and guidebook.  Patient learns about calorie density and how it impacts the Pritikin Eating Plan. Knowing the characteristics of the food you choose will help you decide whether those foods will lead to weight gain or weight loss, and whether you want to consume more or less of them. Weight loss is usually a side effect of the Pritikin Eating Plan because of its focus on low calorie-dense foods.  Label Reading  Clinical staff conducted group or individual video education with verbal and written material and guidebook.  Patient learns about the Pritikin recommended label reading guidelines and corresponding recommendations  regarding calorie density, added sugars, sodium content, and whole grains.  Dining Out - Part 1  Clinical staff conducted group or individual video education with verbal and written material and guidebook.  Patient learns that restaurant meals can be sabotaging because they can be so high in calories, fat, sodium, and/or sugar. Patient learns recommended strategies on how to positively address this and avoid unhealthy pitfalls.  Facts on Fats  Clinical staff conducted group or individual video education with verbal and written material and guidebook.  Patient learns that lifestyle modifications can be just as effective, if not more  so, as many medications for lowering your risk of heart disease. A Pritikin lifestyle can help to reduce your risk of inflammation and atherosclerosis (cholesterol build-up, or plaque, in the artery walls). Lifestyle interventions such as dietary choices and physical activity address the cause of atherosclerosis. A review of the types of fats and their impact on blood cholesterol levels, along with dietary recommendations to reduce fat intake is also included.  Nutrition Action Plan  Clinical staff conducted group or individual video education with verbal and written material and guidebook.  Patient learns how to incorporate Pritikin recommendations into their lifestyle. Recommendations include planning and keeping personal health goals in mind as an important part of their success.  Healthy Mind-Set    Healthy Minds, Bodies, Hearts  Clinical staff conducted group or individual video education with verbal and written material and guidebook.  Patient learns how to identify when they are stressed. Video will discuss the impact of that stress, as well as the many benefits of stress management. Patient will also be introduced to stress management techniques. The way we think, act, and feel has an impact on our hearts.  How Our Thoughts Can Heal Our Hearts  Clinical staff  conducted group or individual video education with verbal and written material and guidebook.  Patient learns that negative thoughts can cause depression and anxiety. This can result in negative lifestyle behavior and serious health problems. Cognitive behavioral therapy is an effective method to help control our thoughts in order to change and improve our emotional outlook.  Additional Videos:  Exercise    Improving Performance  Clinical staff conducted group or individual video education with verbal and written material and guidebook.  Patient learns to use a non-linear approach by alternating intensity levels and lengths of time spent exercising to help burn more calories and lose more body fat. Cardiovascular exercise helps improve heart health, metabolism, hormonal balance, blood sugar control, and recovery from fatigue. Resistance training improves strength, endurance, balance, coordination, reaction time, metabolism, and muscle mass. Flexibility exercise improves circulation, posture, and balance. Seek guidance from your physician and exercise physiologist before implementing an exercise routine and learn your capabilities and proper form for all exercise.  Introduction to Yoga  Clinical staff conducted group or individual video education with verbal and written material and guidebook.  Patient learns about yoga, a discipline of the coming together of mind, breath, and body. The benefits of yoga include improved flexibility, improved range of motion, better posture and core strength, increased lung function, weight loss, and positive self-image. Yoga's heart health benefits include lowered blood pressure, healthier heart rate, decreased cholesterol and triglyceride levels, improved immune function, and reduced stress. Seek guidance from your physician and exercise physiologist before implementing an exercise routine and learn your capabilities and proper form for all exercise.  Medical   Aging:  Enhancing Your Quality of Life  Clinical staff conducted group or individual video education with verbal and written material and guidebook.  Patient learns key strategies and recommendations to stay in good physical health and enhance quality of life, such as prevention strategies, having an advocate, securing a Health Care Proxy and Power of Attorney, and keeping a list of medications and system for tracking them. It also discusses how to avoid risk for bone loss.  Biology of Weight Control  Clinical staff conducted group or individual video education with verbal and written material and guidebook.  Patient learns that weight gain occurs because we consume more calories than we burn (eating more,  moving less). Even if your body weight is normal, you may have higher ratios of fat compared to muscle mass. Too much body fat puts you at increased risk for cardiovascular disease, heart attack, stroke, type 2 diabetes, and obesity-related cancers. In addition to exercise, following the Pritikin Eating Plan can help reduce your risk.  Decoding Lab Results  Clinical staff conducted group or individual video education with verbal and written material and guidebook.  Patient learns that lab test reflects one measurement whose values change over time and are influenced by many factors, including medication, stress, sleep, exercise, food, hydration, pre-existing medical conditions, and more. It is recommended to use the knowledge from this video to become more involved with your lab results and evaluate your numbers to speak with your doctor.   Diseases of Our Time - Overview  Clinical staff conducted group or individual video education with verbal and written material and guidebook.  Patient learns that according to the CDC, 50% to 70% of chronic diseases (such as obesity, type 2 diabetes, elevated lipids, hypertension, and heart disease) are avoidable through lifestyle improvements including healthier food  choices, listening to satiety cues, and increased physical activity.  Sleep Disorders Clinical staff conducted group or individual video education with verbal and written material and guidebook.  Patient learns how good quality and duration of sleep are important to overall health and well-being. Patient also learns about sleep disorders and how they impact health along with recommendations to address them, including discussing with a physician.  Nutrition  Dining Out - Part 2 Clinical staff conducted group or individual video education with verbal and written material and guidebook.  Patient learns how to plan ahead and communicate in order to maximize their dining experience in a healthy and nutritious manner. Included are recommended food choices based on the type of restaurant the patient is visiting.   Fueling a Banker conducted group or individual video education with verbal and written material and guidebook.  There is a strong connection between our food choices and our health. Diseases like obesity and type 2 diabetes are very prevalent and are in large-part due to lifestyle choices. The Pritikin Eating Plan provides plenty of food and hunger-curbing satisfaction. It is easy to follow, affordable, and helps reduce health risks.  Menu Workshop  Clinical staff conducted group or individual video education with verbal and written material and guidebook.  Patient learns that restaurant meals can sabotage health goals because they are often packed with calories, fat, sodium, and sugar. Recommendations include strategies to plan ahead and to communicate with the manager, chef, or server to help order a healthier meal.  Planning Your Eating Strategy  Clinical staff conducted group or individual video education with verbal and written material and guidebook.  Patient learns about the Pritikin Eating Plan and its benefit of reducing the risk of disease. The Pritikin Eating  Plan does not focus on calories. Instead, it emphasizes high-quality, nutrient-rich foods. By knowing the characteristics of the foods, we choose, we can determine their calorie density and make informed decisions.  Targeting Your Nutrition Priorities  Clinical staff conducted group or individual video education with verbal and written material and guidebook.  Patient learns that lifestyle habits have a tremendous impact on disease risk and progression. This video provides eating and physical activity recommendations based on your personal health goals, such as reducing LDL cholesterol, losing weight, preventing or controlling type 2 diabetes, and reducing high blood pressure.  Vitamins and Minerals  Clinical staff conducted group or individual video education with verbal and written material and guidebook.  Patient learns different ways to obtain key vitamins and minerals, including through a recommended healthy diet. It is important to discuss all supplements you take with your doctor.   Healthy Mind-Set    Smoking Cessation  Clinical staff conducted group or individual video education with verbal and written material and guidebook.  Patient learns that cigarette smoking and tobacco addiction pose a serious health risk which affects millions of people. Stopping smoking will significantly reduce the risk of heart disease, lung disease, and many forms of cancer. Recommended strategies for quitting are covered, including working with your doctor to develop a successful plan.  Culinary   Becoming a Set designer conducted group or individual video education with verbal and written material and guidebook.  Patient learns that cooking at home can be healthy, cost-effective, quick, and puts them in control. Keys to cooking healthy recipes will include looking at your recipe, assessing your equipment needs, planning ahead, making it simple, choosing cost-effective seasonal ingredients,  and limiting the use of added fats, salts, and sugars.  Cooking - Breakfast and Snacks  Clinical staff conducted group or individual video education with verbal and written material and guidebook.  Patient learns how important breakfast is to satiety and nutrition through the entire day. Recommendations include key foods to eat during breakfast to help stabilize blood sugar levels and to prevent overeating at meals later in the day. Planning ahead is also a key component.  Cooking - Educational psychologist conducted group or individual video education with verbal and written material and guidebook.  Patient learns eating strategies to improve overall health, including an approach to cook more at home. Recommendations include thinking of animal protein as a side on your plate rather than center stage and focusing instead on lower calorie dense options like vegetables, fruits, whole grains, and plant-based proteins, such as beans. Making sauces in large quantities to freeze for later and leaving the skin on your vegetables are also recommended to maximize your experience.  Cooking - Healthy Salads and Dressing Clinical staff conducted group or individual video education with verbal and written material and guidebook.  Patient learns that vegetables, fruits, whole grains, and legumes are the foundations of the Pritikin Eating Plan. Recommendations include how to incorporate each of these in flavorful and healthy salads, and how to create homemade salad dressings. Proper handling of ingredients is also covered. Cooking - Soups and [employer] - Soups and Desserts Clinical staff conducted group or individual video education with verbal and written material and guidebook.  Patient learns that Pritikin soups and desserts make for easy, nutritious, and delicious snacks and meal components that are low in sodium, fat, sugar, and calorie density, while high in vitamins, minerals, and filling  fiber. Recommendations include simple and healthy ideas for soups and desserts.   Overview     The Pritikin Solution Program Overview Clinical staff conducted group or individual video education with verbal and written material and guidebook.  Patient learns that the results of the Pritikin Program have been documented in more than 100 articles published in peer-reviewed journals, and the benefits include reducing risk factors for (and, in some cases, even reversing) high cholesterol, high blood pressure, type 2 diabetes, obesity, and more! An overview of the three key pillars of the Pritikin Program will be covered: eating well, doing regular exercise, and having a healthy mind-set.  WORKSHOPS  Exercise: Exercise Basics: Building Your Action Plan Clinical staff led group instruction and group discussion with PowerPoint presentation and patient guidebook. To enhance the learning environment the use of posters, models and videos may be added. At the conclusion of this workshop, patients will comprehend the difference between physical activity and exercise, as well as the benefits of incorporating both, into their routine. Patients will understand the FITT (Frequency, Intensity, Time, and Type) principle and how to use it to build an exercise action plan. In addition, safety concerns and other considerations for exercise and cardiac rehab will be addressed by the presenter. The purpose of this lesson is to promote a comprehensive and effective weekly exercise routine in order to improve patients' overall level of fitness.   Managing Heart Disease: Your Path to a Healthier Heart Clinical staff led group instruction and group discussion with PowerPoint presentation and patient guidebook. To enhance the learning environment the use of posters, models and videos may be added.At the conclusion of this workshop, patients will understand the anatomy and physiology of the heart. Additionally, they will  understand how Pritikin's three pillars impact the risk factors, the progression, and the management of heart disease.  The purpose of this lesson is to provide a high-level overview of the heart, heart disease, and how the Pritikin lifestyle positively impacts risk factors.  Exercise Biomechanics Clinical staff led group instruction and group discussion with PowerPoint presentation and patient guidebook. To enhance the learning environment the use of posters, models and videos may be added. Patients will learn how the structural parts of their bodies function and how these functions impact their daily activities, movement, and exercise. Patients will learn how to promote a neutral spine, learn how to manage pain, and identify ways to improve their physical movement in order to promote healthy living. The purpose of this lesson is to expose patients to common physical limitations that impact physical activity. Participants will learn practical ways to adapt and manage aches and pains, and to minimize their effect on regular exercise. Patients will learn how to maintain good posture while sitting, walking, and lifting.  Balance Training and Fall Prevention  Clinical staff led group instruction and group discussion with PowerPoint presentation and patient guidebook. To enhance the learning environment the use of posters, models and videos may be added. At the conclusion of this workshop, patients will understand the importance of their sensorimotor skills (vision, proprioception, and the vestibular system) in maintaining their ability to balance as they age. Patients will apply a variety of balancing exercises that are appropriate for their current level of function. Patients will understand the common causes for poor balance, possible solutions to these problems, and ways to modify their physical environment in order to minimize their fall risk. The purpose of this lesson is to teach patients  about the importance of maintaining balance as they age and ways to minimize their risk of falling.  WORKSHOPS   Nutrition:  Fueling a Ship broker led group instruction and group discussion with PowerPoint presentation and patient guidebook. To enhance the learning environment the use of posters, models and videos may be added. Patients will review the foundational principles of the Pritikin Eating Plan and understand what constitutes a serving size in each of the food groups. Patients will also learn Pritikin-friendly foods that are better choices when away from home and review make-ahead meal and snack options. Calorie density will be reviewed and applied to three nutrition priorities: weight maintenance, weight loss,  and weight gain. The purpose of this lesson is to reinforce (in a group setting) the key concepts around what patients are recommended to eat and how to apply these guidelines when away from home by planning and selecting Pritikin-friendly options. Patients will understand how calorie density may be adjusted for different weight management goals.  Mindful Eating  Clinical staff led group instruction and group discussion with PowerPoint presentation and patient guidebook. To enhance the learning environment the use of posters, models and videos may be added. Patients will briefly review the concepts of the Pritikin Eating Plan and the importance of low-calorie dense foods. The concept of mindful eating will be introduced as well as the importance of paying attention to internal hunger signals. Triggers for non-hunger eating and techniques for dealing with triggers will be explored. The purpose of this lesson is to provide patients with the opportunity to review the basic principles of the Pritikin Eating Plan, discuss the value of eating mindfully and how to measure internal cues of hunger and fullness using the Hunger Scale. Patients will also discuss reasons for non-hunger  eating and learn strategies to use for controlling emotional eating.  Targeting Your Nutrition Priorities Clinical staff led group instruction and group discussion with PowerPoint presentation and patient guidebook. To enhance the learning environment the use of posters, models and videos may be added. Patients will learn how to determine their genetic susceptibility to disease by reviewing their family history. Patients will gain insight into the importance of diet as part of an overall healthy lifestyle in mitigating the impact of genetics and other environmental insults. The purpose of this lesson is to provide patients with the opportunity to assess their personal nutrition priorities by looking at their family history, their own health history and current risk factors. Patients will also be able to discuss ways of prioritizing and modifying the Pritikin Eating Plan for their highest risk areas  Menu  Clinical staff led group instruction and group discussion with PowerPoint presentation and patient guidebook. To enhance the learning environment the use of posters, models and videos may be added. Using menus brought in from [employer], or printed from [employer], patients will apply the Pritikin dining out guidelines that were presented in the [employer] video. Patients will also be able to practice these guidelines in a variety of provided scenarios. The purpose of this lesson is to provide patients with the opportunity to practice hands-on learning of the Pritikin Dining Out guidelines with actual menus and practice scenarios.  Label Reading Clinical staff led group instruction and group discussion with PowerPoint presentation and patient guidebook. To enhance the learning environment the use of posters, models and videos may be added. Patients will review and discuss the Pritikin label reading guidelines presented in Pritikin's Label Reading Educational series video.  Using fool labels brought in from local grocery stores and markets, patients will apply the label reading guidelines and determine if the packaged food meet the Pritikin guidelines. The purpose of this lesson is to provide patients with the opportunity to review, discuss, and practice hands-on learning of the Pritikin Label Reading guidelines with actual packaged food labels. Cooking School  Pritikin's [employer] are designed to teach patients ways to prepare quick, simple, and affordable recipes at home. The importance of nutrition's role in chronic disease risk reduction is reflected in its emphasis in the overall Pritikin program. By learning how to prepare essential core Pritikin Eating Plan recipes, patients will increase control over  what they eat; be able to customize the flavor of foods without the use of added salt, sugar, or fat; and improve the quality of the food they consume. By learning a set of core recipes which are easily assembled, quickly prepared, and affordable, patients are more likely to prepare more healthy foods at home. These workshops focus on convenient breakfasts, simple entres, side dishes, and desserts which can be prepared with minimal effort and are consistent with nutrition recommendations for cardiovascular risk reduction. Cooking [employer] are taught by a Armed forces logistics/support/administrative officer (RD) who has been trained by the [employer]. The chef or RD has a clear understanding of the importance of minimizing - if not completely eliminating - added fat, sugar, and sodium in recipes. Throughout the series of Cooking School Workshop sessions, patients will learn about healthy ingredients and efficient methods of cooking to build confidence in their capability to prepare    Cooking School weekly topics:  Adding Flavor- Sodium-Free  Fast and Healthy Breakfasts  Powerhouse Plant-Based Proteins  Satisfying Salads and Dressings  Simple Sides and  Sauces  International Cuisine-Spotlight on the [employer] Zones  Delicious Desserts  Savory Soups  [employer] - Meals in a Astronomer Appetizers and Snacks  Comforting Weekend Breakfasts  One-Pot Wonders   Fast Evening Meals  Landscape architect Your Pritikin Plate  WORKSHOPS   Healthy Mindset (Psychosocial):  Focused Goals, Sustainable Changes Clinical staff led group instruction and group discussion with PowerPoint presentation and patient guidebook. To enhance the learning environment the use of posters, models and videos may be added. Patients will be able to apply effective goal setting strategies to establish at least one personal goal, and then take consistent, meaningful action toward that goal. They will learn to identify common barriers to achieving personal goals and develop strategies to overcome them. Patients will also gain an understanding of how our mind-set can impact our ability to achieve goals and the importance of cultivating a positive and growth-oriented mind-set. The purpose of this lesson is to provide patients with a deeper understanding of how to set and achieve personal goals, as well as the tools and strategies needed to overcome common obstacles which may arise along the way.  From Head to Heart: The Power of a Healthy Outlook  Clinical staff led group instruction and group discussion with PowerPoint presentation and patient guidebook. To enhance the learning environment the use of posters, models and videos may be added. Patients will be able to recognize and describe the impact of emotions and mood on physical health. They will discover the importance of self-care and explore self-care practices which may work for them. Patients will also learn how to utilize the 4 C's to cultivate a healthier outlook and better manage stress and challenges. The purpose of this lesson is to demonstrate to patients how a healthy outlook is an essential part of  maintaining good health, especially as they continue their cardiac rehab journey.  Healthy Sleep for a Healthy Heart Clinical staff led group instruction and group discussion with PowerPoint presentation and patient guidebook. To enhance the learning environment the use of posters, models and videos may be added. At the conclusion of this workshop, patients will be able to demonstrate knowledge of the importance of sleep to overall health, well-being, and quality of life. They will understand the symptoms of, and treatments for, common sleep disorders. Patients will also be able to identify daytime and nighttime behaviors which impact sleep,  and they will be able to apply these tools to help manage sleep-related challenges. The purpose of this lesson is to provide patients with a general overview of sleep and outline the importance of quality sleep. Patients will learn about a few of the most common sleep disorders. Patients will also be introduced to the concept of "sleep hygiene," and discover ways to self-manage certain sleeping problems through simple daily behavior changes. Finally, the workshop will motivate patients by clarifying the links between quality sleep and their goals of heart-healthy living.   Recognizing and Reducing Stress Clinical staff led group instruction and group discussion with PowerPoint presentation and patient guidebook. To enhance the learning environment the use of posters, models and videos may be added. At the conclusion of this workshop, patients will be able to understand the types of stress reactions, differentiate between acute and chronic stress, and recognize the impact that chronic stress has on their health. They will also be able to apply different coping mechanisms, such as reframing negative self-talk. Patients will have the opportunity to practice a variety of stress management techniques, such as deep abdominal breathing, progressive muscle relaxation, and/or  guided imagery.  The purpose of this lesson is to educate patients on the role of stress in their lives and to provide healthy techniques for coping with it.  Learning Barriers/Preferences:  Learning Barriers/Preferences - 10/22/23 1112       Learning Barriers/Preferences   Learning Barriers Sight;Hearing    Learning Preferences Computer/Internet;Group Instruction;Individual Instruction;Skilled Demonstration;Pictoral;Written Material             Education Topics:  Knowledge Questionnaire Score:  Knowledge Questionnaire Score - 10/22/23 1113       Knowledge Questionnaire Score   Pre Score 19/24             Core Components/Risk Factors/Patient Goals at Admission:  Personal Goals and Risk Factors at Admission - 10/22/23 1114       Core Components/Risk Factors/Patient Goals on Admission    Weight Management Yes;Weight Loss    Intervention Weight Management: Develop a combined nutrition and exercise program designed to reach desired caloric intake, while maintaining appropriate intake of nutrient and fiber, sodium and fats, and appropriate energy expenditure required for the weight goal.;Weight Management: Provide education and appropriate resources to help participant work on and attain dietary goals.    Expected Outcomes Short Term: Continue to assess and modify interventions until short term weight is achieved;Long Term: Adherence to nutrition and physical activity/exercise program aimed toward attainment of established weight goal;Weight Loss: Understanding of general recommendations for a balanced deficit meal plan, which promotes 1-2 lb weight loss per week and includes a negative energy balance of [phone] kcal/d;Understanding of distribution of calorie intake throughout the day with the consumption of 4-5 meals/snacks;Understanding recommendations for meals to include 15-35% energy as protein, 25-35% energy from fat, 35-60% energy from carbohydrates, less than 200mg  of  dietary cholesterol, 20-35 gm of total fiber daily    Diabetes Yes    Intervention Provide education about signs/symptoms and action to take for hypo/hyperglycemia.;Provide education about proper nutrition, including hydration, and aerobic/resistive exercise prescription along with prescribed medications to achieve blood glucose in normal ranges: Fasting glucose 65-99 mg/dL    Expected Outcomes Short Term: Participant verbalizes understanding of the signs/symptoms and immediate care of hyper/hypoglycemia, proper foot care and importance of medication, aerobic/resistive exercise and nutrition plan for blood glucose control.;Long Term: Attainment of HbA1C < 7%.    Hypertension Yes    Intervention Provide  education on lifestyle modifcations including regular physical activity/exercise, weight management, moderate sodium restriction and increased consumption of fresh fruit, vegetables, and low fat dairy, alcohol moderation, and smoking cessation.;Monitor prescription use compliance.    Expected Outcomes Short Term: Continued assessment and intervention until BP is < 140/18mm HG in hypertensive participants. < 130/10mm HG in hypertensive participants with diabetes, heart failure or chronic kidney disease.;Long Term: Maintenance of blood pressure at goal levels.    Lipids Yes    Intervention Provide education and support for participant on nutrition & aerobic/resistive exercise along with prescribed medications to achieve LDL 70mg , HDL >40mg .    Expected Outcomes Short Term: Participant states understanding of desired cholesterol values and is compliant with medications prescribed. Participant is following exercise prescription and nutrition guidelines.;Long Term: Cholesterol controlled with medications as prescribed, with individualized exercise RX and with personalized nutrition plan. Value goals: LDL < 70mg , HDL > 40 mg.             Core Components/Risk Factors/Patient Goals Review:    Core  Components/Risk Factors/Patient Goals at Discharge (Final Review):    ITP Comments:  ITP Comments     Row Name 10/22/23 1039           ITP Comments Dr. Armanda Magic medical director. Introduction to [facility] education/ intensive cardiac rehab. Initial orientation packet reviewed with patient.                Comments: Participant attended orientation for the cardiac rehabilitation program on  10/22/2023  to perform initial intake and exercise walk test. Patient introduced to the [facility] education and orientation packet was reviewed. Completed 6-minute walk test, measurements, initial ITP, and exercise prescription. Vital signs stable. Telemetry-normal sinus rhythm, asymptomatic.   Service time was from 1028 to 1200.  Jonna Coup, MS, ACSM-CEP 10/22/2023 12:17 PM

## 2023-10-22 NOTE — Progress Notes (Signed)
Cardiac Rehab Medication Review   Does the patient  feel that his/her medications are working for him/her?  YES   Has the patient been experiencing any side effects to the medications prescribed?   NO  Does the patient measure his/her own blood pressure or blood glucose at home?  YES    Does the patient have any problems obtaining medications due to transportation or finances?   NO   Understanding of regimen: good Understanding of indications: good Potential of compliance: excellent    Comments: Fred [name] understands his medications and regime well. He has a CGM for checking his blood sugar and a BP cuff at home, but does not use it regularly. Encouraged to use BP cuff regularly.      Jonna Coup, MS, ACSM- CEP 10/22/2023 11:15 AM

## 2023-10-24 ENCOUNTER — Encounter (HOSPITAL_COMMUNITY)
Admission: RE | Admit: 2023-10-24 | Discharge: 2023-10-24 | Disposition: A | Discharge status: Home / Self Care | Payer: Medicare Other | Source: Ambulatory Visit | Attending: Cardiovascular Disease | Admitting: Cardiovascular Disease

## 2023-10-24 DIAGNOSIS — Z955 Presence of coronary angioplasty implant and graft: Secondary | ICD-10-CM

## 2023-10-24 DIAGNOSIS — Z48812 Encounter for surgical aftercare following surgery on the circulatory system: Secondary | ICD-10-CM | POA: Diagnosis not present

## 2023-10-24 LAB — GLUCOSE, CAPILLARY
Glucose-Capillary: 122 mg/dL — ABNORMAL HIGH (ref 70–99)
Glucose-Capillary: 146 mg/dL — ABNORMAL HIGH (ref 70–99)

## 2023-10-24 NOTE — Progress Notes (Signed)
Cardiac Individual Treatment Plan  Patient Details  Name: Fred [name] MRN: [id number] Date of Birth: [birth year] Referring Provider:   Flowsheet Row INTENSIVE CARDIAC REHAB ORIENT from 10/22/2023 in [facility]  Referring Provider Nanetta Batty, MD       Initial Encounter Date:  Flowsheet Row INTENSIVE CARDIAC REHAB ORIENT from 10/22/2023 in [facility] Heart, V[facility], [facility]  Date 10/22/23       Visit Diagnosis: 09/18/23 DES SVG-OM  Patient's Home Medications on Admission:  Current Outpatient Medications:    Accu-Chek FastClix Lancets MISC, USE 1 TO 4 TIMES DAILY AS  NEEDED/DIRECTED, Disp: 408 each, Rfl: 3   ACCU-CHEK GUIDE test strip, USE TO CHECK BLOOD SUGAR DAILY  AS DIRECTED, Disp: 100 strip, Rfl: 10   acetaminophen (TYLENOL) 650 MG CR tablet, Take 1,300 mg by mouth 2 (two) times daily., Disp: , Rfl:    aspirin 81 MG tablet, Take 1 tablet (81 mg total) by mouth daily., Disp: , Rfl:    Blood Glucose Monitoring Suppl (ACCU-CHEK AVIVA) device, by Other route. Use as instructed 1 time daily, Disp: , Rfl:    clopidogrel (PLAVIX) 75 MG tablet, Take 1 tablet (75 mg total) by mouth daily with breakfast., Disp: 90 tablet, Rfl: 3   clotrimazole (LOTRIMIN) 1 % cream, Apply 1 application  topically daily as needed (Groin). (Patient not taking: Reported on 10/15/2023), Disp: , Rfl:    Continuous Glucose Sensor (DEXCOM G6 SENSOR) MISC, by Does not apply route., Disp: , Rfl:    dapagliflozin propanediol (FARXIGA) 10 MG TABS tablet, Take 1 tablet (10 mg total) by mouth daily before breakfast., Disp: 90 tablet, Rfl: 3   Insulin Pen Needle 32G X 4 MM MISC, 1 Device by Does not apply route daily., Disp: 100 each, Rfl: 3   levothyroxine (SYNTHROID) 25 MCG tablet, Take 1 tablet (25 mcg total) by mouth daily before breakfast., Disp: 90 tablet, Rfl: 3   metFORMIN (GLUCOPHAGE) 1000 MG tablet, Take 1  tablet (1,000 mg total) by mouth 2 (two) times daily with a meal., Disp: 180 tablet, Rfl: 3   metoprolol tartrate (LOPRESSOR) 25 MG tablet, TAKE ONE-HALF TABLET BY MOUTH  TWICE DAILY, Disp: 90 tablet, Rfl: 3   Multiple Vitamins-Minerals (MULTIVITAMIN WITH MINERALS) tablet, Take 1 tablet by mouth daily., Disp: , Rfl:    nitroGLYCERIN (NITROSTAT) 0.4 MG SL tablet, Place 1 tablet (0.4 mg total) under the tongue every 5 (five) minutes as needed for chest pain., Disp: 25 tablet, Rfl: 3   rosuvastatin (CRESTOR) 20 MG tablet, Take 1 tablet (20 mg total) by mouth daily., Disp: 90 tablet, Rfl: 3   Semaglutide, 1 MG/DOSE, 4 MG/3ML SOPN, Inject 1 mg as directed once a week., Disp: 9 mL, Rfl: 3   solifenacin (VESICARE) 10 MG tablet, Take 10 mg by mouth daily., Disp: , Rfl:    tamsulosin (FLOMAX) 0.4 MG CAPS capsule, Take 0.4 mg by mouth daily., Disp: , Rfl:    terbinafine (LAMISIL) 250 MG tablet, Take 250 mg by mouth daily., Disp: , Rfl:    TOUJEO MAX SOLOSTAR 300 UNIT/ML Solostar Pen, Inject 40 Units into the skin daily., Disp: 15 mL, Rfl: 6  Past Medical History: Past Medical History:  Diagnosis Date   Coronary artery disease    Diabetes mellitus without complication (HCC)    High cholesterol    Vertigo     Tobacco Use: Social History   Tobacco Use  Smoking Status Former   Current packs/day: 0.00   Types: Cigarettes   Start date: 12/1952   Quit date: 12/1962   Years since quitting: 60.9  Smokeless Tobacco Never    Labs: Review Flowsheet  More data exists      Latest Ref Rng & Units 10/04/2021 02/04/2022 06/03/2022 12/04/2022 06/05/2023  Labs for ITP Cardiac and Pulmonary Rehab  Cholestrol 0 - 200 mg/dL - - - 846  -  Direct LDL mg/dL - - - 96.2  -  HDL-C >95.28 mg/dL - - - 41.32  -  Trlycerides 0.0 - 149.0 mg/dL - - - 440.1  -  Hemoglobin A1c 4.0 - 5.6 % 8.0  9.5  8.1  8.5  7.3     Details            Capillary Blood Glucose: Lab Results  Component Value Date   GLUCAP 146 (H)  10/24/2023   GLUCAP 122 (H) 10/24/2023   GLUCAP 159 (H) 10/22/2023   GLUCAP 105 (H) 09/18/2023   GLUCAP 137 (H) 09/18/2023     Exercise Target Goals: Exercise Program Goal: Individual exercise prescription set using results from initial 6 min walk test and THRR while considering  patient's activity barriers and safety.   Exercise Prescription Goal: Initial exercise prescription builds to 30-45 minutes a day of aerobic activity, 2-3 days per week.  Home exercise guidelines will be given to patient during program as part of exercise prescription that the participant will acknowledge.  Activity Barriers & Risk Stratification:  Activity Barriers & Cardiac Risk Stratification - 10/22/23 1108       Activity Barriers & Cardiac Risk Stratification   Activity Barriers Arthritis;Balance Concerns;Deconditioning;Joint Problems    Cardiac Risk Stratification High   <5 METs on            6 Minute Walk:  6 Minute Walk     Row Name 10/22/23 1214         6 Minute Walk   Phase Initial     Distance 1350 feet     Walk Time 6 minutes     # of Rest Breaks 0     MPH 2.56     METS 1.89     RPE 11     Perceived Dyspnea  0     VO2 Peak 6.61     Symptoms No     Resting HR 72 bpm     Resting BP 102/60     Resting Oxygen Saturation  98 %     Exercise Oxygen Saturation  during 6 min walk 98 %     Max Ex. HR 81 bpm     Max Ex. BP 114/62     2 Minute Post BP 100/54              Oxygen Initial Assessment:   Oxygen Re-Evaluation:   Oxygen Discharge (Final Oxygen Re-Evaluation):   Initial Exercise Prescription:  Initial Exercise Prescription - 10/22/23 1200       Date of Initial Exercise RX and Referring Provider   Date 10/22/23    Referring Provider Nanetta Batty, MD    Expected Discharge Date 01/14/24      NuStep   Level 1    SPM 70    Minutes 15    METs 2      Track   Laps 16    Minutes 15    METs 2      Prescription Details   Frequency (times  per  week) 3    Duration Progress to 30 minutes of continuous aerobic without signs/symptoms of physical distress      Intensity   THRR 40-80% of Max Heartrate 55-110    Ratings of Perceived Exertion 11-13    Perceived Dyspnea 0-4      Progression   Progression Continue to progress workloads to maintain intensity without signs/symptoms of physical distress.      Resistance Training   Training Prescription Yes    Weight 3    Reps 10-15             Perform Capillary Blood Glucose checks as needed.  Exercise Prescription Changes:   Exercise Prescription Changes     Row Name 10/24/23 1253             Response to Exercise   Blood Pressure (Admit) 108/72       Blood Pressure (Exercise) 112/58       Blood Pressure (Exit) 114/70       Heart Rate (Admit) 69 bpm       Heart Rate (Exercise) 91 bpm       Heart Rate (Exit) 77 bpm       Rating of Perceived Exertion (Exercise) 11       Symptoms None       Comments Off to a good start with exercise.       Duration Continue with 30 min of aerobic exercise without signs/symptoms of physical distress.       Intensity THRR unchanged         Progression   Progression Continue to progress workloads to maintain intensity without signs/symptoms of physical distress.       Average METs 2.4         Resistance Training   Training Prescription Yes       Weight 3 lbs       Reps 10-15       Time 10 Minutes         Interval Training   Interval Training No         NuStep   Level 1       SPM 83       Minutes 15       METs 2         Track   Laps 15       Minutes 15       METs 2.91                Exercise Comments:   Exercise Comments     Row Name 10/24/23 1347           Exercise Comments Heber tolerated low intensity exercise well without symptoms.                Exercise Goals and Review:   Exercise Goals     Row Name 10/22/23 1111             Exercise Goals   Increase Physical Activity Yes        Intervention Provide advice, education, support and counseling about physical activity/exercise needs.;Develop an individualized exercise prescription for aerobic and resistive training based on initial evaluation findings, risk stratification, comorbidities and participant's personal goals.       Expected Outcomes Short Term: Attend rehab on a regular basis to increase amount of physical activity.;Long Term: Exercising regularly at least 3-5 days a week.;Long Term: Add in home exercise to make exercise part of routine and  to increase amount of physical activity.       Increase Strength and Stamina Yes       Intervention Provide advice, education, support and counseling about physical activity/exercise needs.;Develop an individualized exercise prescription for aerobic and resistive training based on initial evaluation findings, risk stratification, comorbidities and participant's personal goals.       Expected Outcomes Long Term: Improve cardiorespiratory fitness, muscular endurance and strength as measured by increased METs and functional capacity ( );Short Term: Increase workloads from initial exercise prescription for resistance, speed, and METs.;Short Term: Perform resistance training exercises routinely during rehab and add in resistance training at home       Able to understand and use rate of perceived exertion (RPE) scale Yes       Intervention Provide education and explanation on how to use RPE scale       Expected Outcomes Short Term: Able to use RPE daily in rehab to express subjective intensity level;Long Term:  Able to use RPE to guide intensity level when exercising independently       Knowledge and understanding of Target Heart Rate Range (THRR) Yes       Intervention Provide education and explanation of THRR including how the numbers were predicted and where they are located for reference       Expected Outcomes Short Term: Able to state/look up THRR;Short Term: Able to use daily as  guideline for intensity in rehab;Long Term: Able to use THRR to govern intensity when exercising independently       Understanding of Exercise Prescription Yes       Intervention Provide education, explanation, and written materials on patient's individual exercise prescription       Expected Outcomes Short Term: Able to explain program exercise prescription;Long Term: Able to explain home exercise prescription to exercise independently                Exercise Goals Re-Evaluation :  Exercise Goals Re-Evaluation     Row Name 10/24/23 1347             Exercise Goal Re-Evaluation   Exercise Goals Review Increase Physical Activity;Increase Strength and Stamina;Able to understand and use rate of perceived exertion (RPE) scale       Comments Calub was able to understand and use RPE scale appropriately.       Expected Outcomes Progress workloads as tolerated to help increase strength and stamina.                Discharge Exercise Prescription (Final Exercise Prescription Changes):  Exercise Prescription Changes - 10/24/23 1253       Response to Exercise   Blood Pressure (Admit) 108/72    Blood Pressure (Exercise) 112/58    Blood Pressure (Exit) 114/70    Heart Rate (Admit) 69 bpm    Heart Rate (Exercise) 91 bpm    Heart Rate (Exit) 77 bpm    Rating of Perceived Exertion (Exercise) 11    Symptoms None    Comments Off to a good start with exercise.    Duration Continue with 30 min of aerobic exercise without signs/symptoms of physical distress.    Intensity THRR unchanged      Progression   Progression Continue to progress workloads to maintain intensity without signs/symptoms of physical distress.    Average METs 2.4      Resistance Training   Training Prescription Yes    Weight 3 lbs    Reps 10-15    Time 10 Minutes  Interval Training   Interval Training No      NuStep   Level 1    SPM 83    Minutes 15    METs 2      Track   Laps 15    Minutes 15     METs 2.91             Nutrition:  Target Goals: Understanding of nutrition guidelines, daily intake of sodium 1500mg , cholesterol 200mg , calories 30% from fat and 7% or less from saturated fats, daily to have 5 or more servings of fruits and vegetables.  Biometrics:  Pre Biometrics - 10/22/23 1038       Pre Biometrics   Waist Circumference 44 inches    Hip Circumference 43.5 inches    Waist to Hip Ratio 1.01 %    Triceps Skinfold 8 mm    % Body Fat 28.2 %    Grip Strength 28 kg    Flexibility 0 in   could not reach   Single Leg Stand 12.87 seconds              Nutrition Therapy Plan and Nutrition Goals:  Nutrition Therapy & Goals - 10/24/23 1451       Nutrition Therapy   Diet Heart healthy diet    Drug/Food Interactions Statins/Certain Fruits      Personal Nutrition Goals   Nutrition Goal Patient to identify strategies for reducing cardiovascular risk by attending the Pritikin education and nutrition series weekly.    Personal Goal #2 Patient to improve diet quality by using the plate method as a guide for meal planning to include lean protein/plant protein, fruits, vegetables, whole grains, nonfat dairy as part of a well-balanced diet.    Comments Patient has medical history of CAD, DM2, Hyperlipidemia, s/p CABGx4, His A1c remains above goal at 7.3. HIs LDl remains well controlled but triglycerides are elevated. Patient will benefit from participation in intensive cardiac rehab for nutrition, exercise, and lifestyle modification.      Intervention Plan   Intervention Prescribe, educate and counsel regarding individualized specific dietary modifications aiming towards targeted core components such as weight, hypertension, lipid management, diabetes, heart failure and other comorbidities.;Nutrition handout(s) given to patient.    Expected Outcomes Short Term Goal: Understand basic principles of dietary content, such as calories, fat, sodium, cholesterol and  nutrients.;Long Term Goal: Adherence to prescribed nutrition plan.             Nutrition Assessments:  MEDIFICTS Score Key: >=70 Need to make dietary changes  40-70 Heart Healthy Diet <= 40 Therapeutic Level Cholesterol Diet    Picture Your Plate Scores: <84 Unhealthy dietary pattern with much room for improvement. 41-50 Dietary pattern unlikely to meet recommendations for good health and room for improvement. 51-60 More healthful dietary pattern, with some room for improvement.  >60 Healthy dietary pattern, although there may be some specific behaviors that could be improved.    Nutrition Goals Re-Evaluation:  Nutrition Goals Re-Evaluation     Row Name 10/24/23 1451             Goals   Current Weight 184 lb 4.9 oz (83.6 kg)       Comment A1c 7.3, LDL 51, HDL 32, triglycerides 397       Expected Outcome Patient has medical history of CAD, DM2, Hyperlipidemia, s/p CABGx4, His A1c remains above goal at 7.3. HIs LDl remains well controlled but triglycerides are elevated. Patient will benefit from participation in intensive cardiac  rehab for nutrition, exercise, and lifestyle modification.                Nutrition Goals Re-Evaluation:  Nutrition Goals Re-Evaluation     Row Name 10/24/23 1451             Goals   Current Weight 184 lb 4.9 oz (83.6 kg)       Comment A1c 7.3, LDL 51, HDL 32, triglycerides 397       Expected Outcome Patient has medical history of CAD, DM2, Hyperlipidemia, s/p CABGx4, His A1c remains above goal at 7.3. HIs LDl remains well controlled but triglycerides are elevated. Patient will benefit from participation in intensive cardiac rehab for nutrition, exercise, and lifestyle modification.                Nutrition Goals Discharge (Final Nutrition Goals Re-Evaluation):  Nutrition Goals Re-Evaluation - 10/24/23 1451       Goals   Current Weight 184 lb 4.9 oz (83.6 kg)    Comment A1c 7.3, LDL 51, HDL 32, triglycerides 397    Expected  Outcome Patient has medical history of CAD, DM2, Hyperlipidemia, s/p CABGx4, His A1c remains above goal at 7.3. HIs LDl remains well controlled but triglycerides are elevated. Patient will benefit from participation in intensive cardiac rehab for nutrition, exercise, and lifestyle modification.             Psychosocial: Target Goals: Acknowledge presence or absence of significant depression and/or stress, maximize coping skills, provide positive support system. Participant is able to verbalize types and ability to use techniques and skills needed for reducing stress and depression.  Initial Review & Psychosocial Screening:  Initial Psych Review & Screening - 10/22/23 1112       Initial Review   Current issues with None Identified      Family Dynamics   Good Support System? Yes   Kilo has his daughters for support     Barriers   Psychosocial barriers to participate in program There are no identifiable barriers or psychosocial needs.      Screening Interventions   Interventions Encouraged to exercise;Provide feedback about the scores to participant    Expected Outcomes Short Term goal: Identification and review with participant of any Quality of Life or Depression concerns found by scoring the questionnaire.;Long Term goal: The participant improves quality of Life and PHQ9 Scores as seen by post scores and/or verbalization of changes             Quality of Life Scores:  Quality of Life - 10/22/23 1122       Quality of Life   Select Quality of Life      Quality of Life Scores   Health/Function Pre 27.43 %    Socioeconomic Pre 26.25 %    Psych/Spiritual Pre 29.64 %    Family Pre 28 %    GLOBAL Pre 27.77 %            Scores of 19 and below usually indicate a poorer quality of life in these areas.  A difference of  2-3 points is a clinically meaningful difference.  A difference of 2-3 points in the total score of the Quality of Life Index has been associated with  significant improvement in overall quality of life, self-image, physical symptoms, and general health in studies assessing change in quality of life.  PHQ-9: Review Flowsheet       10/22/2023 03/20/2020 01/06/2020  Depression screen PHQ 2/9  Decreased Interest 0 0  0  Down, Depressed, Hopeless 0 0 0  PHQ - 2 Score 0 0 0  Altered sleeping 0 - -  Tired, decreased energy 0 - -  Change in appetite 0 - -  Feeling bad or failure about yourself  0 - -  Trouble concentrating 0 - -  Moving slowly or fidgety/restless 0 - -  Suicidal thoughts 0 - -  PHQ-9 Score 0 - -  Difficult doing work/chores Not difficult at all - -    Details           Interpretation of Total Score  Total Score Depression Severity:  1-4 = Minimal depression, 5-9 = Mild depression, 10-14 = Moderate depression, 15-19 = Moderately severe depression, 20-27 = Severe depression   Psychosocial Evaluation and Intervention:   Psychosocial Re-Evaluation:  Psychosocial Re-Evaluation     Row Name 10/24/23 1701             Psychosocial Re-Evaluation   Current issues with None Identified       Interventions Encouraged to attend Cardiac Rehabilitation for the exercise       Continue Psychosocial Services  No Follow up required                Psychosocial Discharge (Final Psychosocial Re-Evaluation):  Psychosocial Re-Evaluation - 10/24/23 1701       Psychosocial Re-Evaluation   Current issues with None Identified    Interventions Encouraged to attend Cardiac Rehabilitation for the exercise    Continue Psychosocial Services  No Follow up required             Vocational Rehabilitation: Provide vocational rehab assistance to qualifying candidates.   Vocational Rehab Evaluation & Intervention:  Vocational Rehab - 10/22/23 1114       Initial Vocational Rehab Evaluation & Intervention   Assessment shows need for Vocational Rehabilitation No   Shanan is retired            Education: Education  Goals: Education classes will be provided on a weekly basis, covering required topics. Participant will state understanding/return demonstration of topics presented.    Education     Row Name 10/24/23 1300     Education   Cardiac Education Topics Pritikin   Hospital doctor Education   General Education Heart Disease Risk Reduction   Instruction Review Code 1- Verbalizes Understanding   Class Start Time 1157   Class Stop Time 1234   Class Time Calculation (min) 37 min            Core Videos: Exercise    Move It!  Clinical staff conducted group or individual video education with verbal and written material and guidebook.  Patient learns the recommended Pritikin exercise program. Exercise with the goal of living a long, healthy life. Some of the health benefits of exercise include controlled diabetes, healthier blood pressure levels, improved cholesterol levels, improved heart and lung capacity, improved sleep, and better body composition. Everyone should speak with their doctor before starting or changing an exercise routine.  Biomechanical Limitations Clinical staff conducted group or individual video education with verbal and written material and guidebook.  Patient learns how biomechanical limitations can impact exercise and how we can mitigate and possibly overcome limitations to have an impactful and balanced exercise routine.  Body Composition Clinical staff conducted group or individual video education with verbal and written material and guidebook.  Patient learns that body composition (  ratio of muscle mass to fat mass) is a key component to assessing overall fitness, rather than body weight alone. Increased fat mass, especially visceral belly fat, can put [place] at increased risk for metabolic syndrome, type 2 diabetes, heart disease, and even death. It is recommended to combine diet and exercise  (cardiovascular and resistance training) to improve your body composition. Seek guidance from your physician and exercise physiologist before implementing an exercise routine.  Exercise Action Plan Clinical staff conducted group or individual video education with verbal and written material and guidebook.  Patient learns the recommended strategies to achieve and enjoy long-term exercise adherence, including variety, self-motivation, self-efficacy, and positive decision making. Benefits of exercise include fitness, good health, weight management, more energy, better sleep, less stress, and overall well-being.  Medical   Heart Disease Risk Reduction Clinical staff conducted group or individual video education with verbal and written material and guidebook.  Patient learns our heart is our most vital organ as it circulates oxygen, nutrients, white blood cells, and hormones throughout the entire body, and carries waste away. Data supports a plant-based eating plan like the Pritikin Program for its effectiveness in slowing progression of and reversing heart disease. The video provides a number of recommendations to address heart disease.   Metabolic Syndrome and Belly Fat  Clinical staff conducted group or individual video education with verbal and written material and guidebook.  Patient learns what metabolic syndrome is, how it leads to heart disease, and how one can reverse it and keep it from coming back. You have metabolic syndrome if you have 3 of the following 5 criteria: abdominal obesity, high blood pressure, high triglycerides, low HDL cholesterol, and high blood sugar.  Hypertension and Heart Disease Clinical staff conducted group or individual video education with verbal and written material and guidebook.  Patient learns that high blood pressure, or hypertension, is very common in the [place]. Hypertension is largely due to excessive salt intake, but other important risk factors  include being overweight, physical inactivity, drinking too much alcohol, smoking, and not eating enough potassium from fruits and vegetables. High blood pressure is a leading risk factor for heart attack, stroke, congestive heart failure, dementia, kidney failure, and premature death. Long-term effects of excessive salt intake include stiffening of the arteries and thickening of heart muscle and organ damage. Recommendations include ways to reduce hypertension and the risk of heart disease.  Diseases of Our Time - Focusing on Diabetes Clinical staff conducted group or individual video education with verbal and written material and guidebook.  Patient learns why the best way to stop diseases of our time is prevention, through food and other lifestyle changes. Medicine (such as prescription pills and surgeries) is often only a Band-Aid on the problem, not a long-term solution. Most common diseases of our time include obesity, type 2 diabetes, hypertension, heart disease, and cancer. The Pritikin Program is recommended and has been proven to help reduce, reverse, and/or prevent the damaging effects of metabolic syndrome.  Nutrition   Overview of the Pritikin Eating Plan  Clinical staff conducted group or individual video education with verbal and written material and guidebook.  Patient learns about the Pritikin Eating Plan for disease risk reduction. The Pritikin Eating Plan emphasizes a wide variety of unrefined, minimally-processed carbohydrates, like fruits, vegetables, whole grains, and legumes. Go, Caution, and Stop food choices are explained. Plant-based and lean animal proteins are emphasized. Rationale provided for low sodium intake for blood pressure control, low added sugars for blood  sugar stabilization, and low added fats and oils for coronary artery disease risk reduction and weight management.  Calorie Density  Clinical staff conducted group or individual video education with verbal and  written material and guidebook.  Patient learns about calorie density and how it impacts the Pritikin Eating Plan. Knowing the characteristics of the food you choose will help you decide whether those foods will lead to weight gain or weight loss, and whether you want to consume more or less of them. Weight loss is usually a side effect of the Pritikin Eating Plan because of its focus on low calorie-dense foods.  Label Reading  Clinical staff conducted group or individual video education with verbal and written material and guidebook.  Patient learns about the Pritikin recommended label reading guidelines and corresponding recommendations regarding calorie density, added sugars, sodium content, and whole grains.  Dining Out - Part 1  Clinical staff conducted group or individual video education with verbal and written material and guidebook.  Patient learns that restaurant meals can be sabotaging because they can be so high in calories, fat, sodium, and/or sugar. Patient learns recommended strategies on how to positively address this and avoid unhealthy pitfalls.  Facts on Fats  Clinical staff conducted group or individual video education with verbal and written material and guidebook.  Patient learns that lifestyle modifications can be just as effective, if not more so, as many medications for lowering your risk of heart disease. A Pritikin lifestyle can help to reduce your risk of inflammation and atherosclerosis (cholesterol build-up, or plaque, in the artery walls). Lifestyle interventions such as dietary choices and physical activity address the cause of atherosclerosis. A review of the types of fats and their impact on blood cholesterol levels, along with dietary recommendations to reduce fat intake is also included.  Nutrition Action Plan  Clinical staff conducted group or individual video education with verbal and written material and guidebook.  Patient learns how to incorporate Pritikin  recommendations into their lifestyle. Recommendations include planning and keeping personal health goals in mind as an important part of their success.  Healthy Mind-Set    Healthy Minds, Bodies, Hearts  Clinical staff conducted group or individual video education with verbal and written material and guidebook.  Patient learns how to identify when they are stressed. Video will discuss the impact of that stress, as well as the many benefits of stress management. Patient will also be introduced to stress management techniques. The way we think, act, and feel has an impact on our hearts.  How Our Thoughts Can Heal Our Hearts  Clinical staff conducted group or individual video education with verbal and written material and guidebook.  Patient learns that negative thoughts can cause depression and anxiety. This can result in negative lifestyle behavior and serious health problems. Cognitive behavioral therapy is an effective method to help control our thoughts in order to change and improve our emotional outlook.  Additional Videos:  Exercise    Improving Performance  Clinical staff conducted group or individual video education with verbal and written material and guidebook.  Patient learns to use a non-linear approach by alternating intensity levels and lengths of time spent exercising to help burn more calories and lose more body fat. Cardiovascular exercise helps improve heart health, metabolism, hormonal balance, blood sugar control, and recovery from fatigue. Resistance training improves strength, endurance, balance, coordination, reaction time, metabolism, and muscle mass. Flexibility exercise improves circulation, posture, and balance. Seek guidance from your physician and exercise physiologist before implementing  an exercise routine and learn your capabilities and proper form for all exercise.  Introduction to Yoga  Clinical staff conducted group or individual video education with verbal and  written material and guidebook.  Patient learns about yoga, a discipline of the coming together of mind, breath, and body. The benefits of yoga include improved flexibility, improved range of motion, better posture and core strength, increased lung function, weight loss, and positive self-image. Yoga's heart health benefits include lowered blood pressure, healthier heart rate, decreased cholesterol and triglyceride levels, improved immune function, and reduced stress. Seek guidance from your physician and exercise physiologist before implementing an exercise routine and learn your capabilities and proper form for all exercise.  Medical   Aging: Enhancing Your Quality of Life  Clinical staff conducted group or individual video education with verbal and written material and guidebook.  Patient learns key strategies and recommendations to stay in good physical health and enhance quality of life, such as prevention strategies, having an advocate, securing a Health Care Proxy and Power of Attorney, and keeping a list of medications and system for tracking them. It also discusses how to avoid risk for bone loss.  Biology of Weight Control  Clinical staff conducted group or individual video education with verbal and written material and guidebook.  Patient learns that weight gain occurs because we consume more calories than we burn (eating more, moving less). Even if your body weight is normal, you may have higher ratios of fat compared to muscle mass. Too much body fat puts you at increased risk for cardiovascular disease, heart attack, stroke, type 2 diabetes, and obesity-related cancers. In addition to exercise, following the Pritikin Eating Plan can help reduce your risk.  Decoding Lab Results  Clinical staff conducted group or individual video education with verbal and written material and guidebook.  Patient learns that lab test reflects one measurement whose values change over time and are influenced  by many factors, including medication, stress, sleep, exercise, food, hydration, pre-existing medical conditions, and more. It is recommended to use the knowledge from this video to become more involved with your lab results and evaluate your numbers to speak with your doctor.   Diseases of Our Time - Overview  Clinical staff conducted group or individual video education with verbal and written material and guidebook.  Patient learns that according to the CDC, 50% to 70% of chronic diseases (such as obesity, type 2 diabetes, elevated lipids, hypertension, and heart disease) are avoidable through lifestyle improvements including healthier food choices, listening to satiety cues, and increased physical activity.  Sleep Disorders Clinical staff conducted group or individual video education with verbal and written material and guidebook.  Patient learns how good quality and duration of sleep are important to overall health and well-being. Patient also learns about sleep disorders and how they impact health along with recommendations to address them, including discussing with a physician.  Nutrition  Dining Out - Part 2 Clinical staff conducted group or individual video education with verbal and written material and guidebook.  Patient learns how to plan ahead and communicate in order to maximize their dining experience in a healthy and nutritious manner. Included are recommended food choices based on the type of restaurant the patient is visiting.   Fueling a Banker conducted group or individual video education with verbal and written material and guidebook.  There is a strong connection between our food choices and our health. Diseases like obesity and type 2 diabetes are  very prevalent and are in large-part due to lifestyle choices. The Pritikin Eating Plan provides plenty of food and hunger-curbing satisfaction. It is easy to follow, affordable, and helps reduce health  risks.  Menu Workshop  Clinical staff conducted group or individual video education with verbal and written material and guidebook.  Patient learns that restaurant meals can sabotage health goals because they are often packed with calories, fat, sodium, and sugar. Recommendations include strategies to plan ahead and to communicate with the manager, chef, or server to help order a healthier meal.  Planning Your Eating Strategy  Clinical staff conducted group or individual video education with verbal and written material and guidebook.  Patient learns about the Pritikin Eating Plan and its benefit of reducing the risk of disease. The Pritikin Eating Plan does not focus on calories. Instead, it emphasizes high-quality, nutrient-rich foods. By knowing the characteristics of the foods, we choose, we can determine their calorie density and make informed decisions.  Targeting Your Nutrition Priorities  Clinical staff conducted group or individual video education with verbal and written material and guidebook.  Patient learns that lifestyle habits have a tremendous impact on disease risk and progression. This video provides eating and physical activity recommendations based on your personal health goals, such as reducing LDL cholesterol, losing weight, preventing or controlling type 2 diabetes, and reducing high blood pressure.  Vitamins and Minerals  Clinical staff conducted group or individual video education with verbal and written material and guidebook.  Patient learns different ways to obtain key vitamins and minerals, including through a recommended healthy diet. It is important to discuss all supplements you take with your doctor.   Healthy Mind-Set    Smoking Cessation  Clinical staff conducted group or individual video education with verbal and written material and guidebook.  Patient learns that cigarette smoking and tobacco addiction pose a serious health risk which affects millions of  people. Stopping smoking will significantly reduce the risk of heart disease, lung disease, and many forms of cancer. Recommended strategies for quitting are covered, including working with your doctor to develop a successful plan.  Culinary   Becoming a Set designer conducted group or individual video education with verbal and written material and guidebook.  Patient learns that cooking at home can be healthy, cost-effective, quick, and puts them in control. Keys to cooking healthy recipes will include looking at your recipe, assessing your equipment needs, planning ahead, making it simple, choosing cost-effective seasonal ingredients, and limiting the use of added fats, salts, and sugars.  Cooking - Breakfast and Snacks  Clinical staff conducted group or individual video education with verbal and written material and guidebook.  Patient learns how important breakfast is to satiety and nutrition through the entire day. Recommendations include key foods to eat during breakfast to help stabilize blood sugar levels and to prevent overeating at meals later in the day. Planning ahead is also a key component.  Cooking - Educational psychologist conducted group or individual video education with verbal and written material and guidebook.  Patient learns eating strategies to improve overall health, including an approach to cook more at home. Recommendations include thinking of animal protein as a side on your plate rather than center stage and focusing instead on lower calorie dense options like vegetables, fruits, whole grains, and plant-based proteins, such as beans. Making sauces in large quantities to freeze for later and leaving the skin on your vegetables are also recommended to maximize your  experience.  Cooking - Healthy Salads and Dressing Clinical staff conducted group or individual video education with verbal and written material and guidebook.  Patient learns that  vegetables, fruits, whole grains, and legumes are the foundations of the Pritikin Eating Plan. Recommendations include how to incorporate each of these in flavorful and healthy salads, and how to create homemade salad dressings. Proper handling of ingredients is also covered. Cooking - Soups and [employer] - Soups and Desserts Clinical staff conducted group or individual video education with verbal and written material and guidebook.  Patient learns that Pritikin soups and desserts make for easy, nutritious, and delicious snacks and meal components that are low in sodium, fat, sugar, and calorie density, while high in vitamins, minerals, and filling fiber. Recommendations include simple and healthy ideas for soups and desserts.   Overview     The Pritikin Solution Program Overview Clinical staff conducted group or individual video education with verbal and written material and guidebook.  Patient learns that the results of the Pritikin Program have been documented in more than 100 articles published in peer-reviewed journals, and the benefits include reducing risk factors for (and, in some cases, even reversing) high cholesterol, high blood pressure, type 2 diabetes, obesity, and more! An overview of the three key pillars of the Pritikin Program will be covered: eating well, doing regular exercise, and having a healthy mind-set.  WORKSHOPS  Exercise: Exercise Basics: Building Your Action Plan Clinical staff led group instruction and group discussion with PowerPoint presentation and patient guidebook. To enhance the learning environment the use of posters, models and videos may be added. At the conclusion of this workshop, patients will comprehend the difference between physical activity and exercise, as well as the benefits of incorporating both, into their routine. Patients will understand the FITT (Frequency, Intensity, Time, and Type) principle and how to use it to build an exercise action  plan. In addition, safety concerns and other considerations for exercise and cardiac rehab will be addressed by the presenter. The purpose of this lesson is to promote a comprehensive and effective weekly exercise routine in order to improve patients' overall level of fitness.   Managing Heart Disease: Your Path to a Healthier Heart Clinical staff led group instruction and group discussion with PowerPoint presentation and patient guidebook. To enhance the learning environment the use of posters, models and videos may be added.At the conclusion of this workshop, patients will understand the anatomy and physiology of the heart. Additionally, they will understand how Pritikin's three pillars impact the risk factors, the progression, and the management of heart disease.  The purpose of this lesson is to provide a high-level overview of the heart, heart disease, and how the Pritikin lifestyle positively impacts risk factors.  Exercise Biomechanics Clinical staff led group instruction and group discussion with PowerPoint presentation and patient guidebook. To enhance the learning environment the use of posters, models and videos may be added. Patients will learn how the structural parts of their bodies function and how these functions impact their daily activities, movement, and exercise. Patients will learn how to promote a neutral spine, learn how to manage pain, and identify ways to improve their physical movement in order to promote healthy living. The purpose of this lesson is to expose patients to common physical limitations that impact physical activity. Participants will learn practical ways to adapt and manage aches and pains, and to minimize their effect on regular exercise. Patients will learn how to maintain good posture while sitting,  walking, and lifting.  Balance Training and Fall Prevention  Clinical staff led group instruction and group discussion with PowerPoint presentation and  patient guidebook. To enhance the learning environment the use of posters, models and videos may be added. At the conclusion of this workshop, patients will understand the importance of their sensorimotor skills (vision, proprioception, and the vestibular system) in maintaining their ability to balance as they age. Patients will apply a variety of balancing exercises that are appropriate for their current level of function. Patients will understand the common causes for poor balance, possible solutions to these problems, and ways to modify their physical environment in order to minimize their fall risk. The purpose of this lesson is to teach patients about the importance of maintaining balance as they age and ways to minimize their risk of falling.  WORKSHOPS   Nutrition:  Fueling a Ship broker led group instruction and group discussion with PowerPoint presentation and patient guidebook. To enhance the learning environment the use of posters, models and videos may be added. Patients will review the foundational principles of the Pritikin Eating Plan and understand what constitutes a serving size in each of the food groups. Patients will also learn Pritikin-friendly foods that are better choices when away from home and review make-ahead meal and snack options. Calorie density will be reviewed and applied to three nutrition priorities: weight maintenance, weight loss, and weight gain. The purpose of this lesson is to reinforce (in a group setting) the key concepts around what patients are recommended to eat and how to apply these guidelines when away from home by planning and selecting Pritikin-friendly options. Patients will understand how calorie density may be adjusted for different weight management goals.  Mindful Eating  Clinical staff led group instruction and group discussion with PowerPoint presentation and patient guidebook. To enhance the learning environment the use of posters,  models and videos may be added. Patients will briefly review the concepts of the Pritikin Eating Plan and the importance of low-calorie dense foods. The concept of mindful eating will be introduced as well as the importance of paying attention to internal hunger signals. Triggers for non-hunger eating and techniques for dealing with triggers will be explored. The purpose of this lesson is to provide patients with the opportunity to review the basic principles of the Pritikin Eating Plan, discuss the value of eating mindfully and how to measure internal cues of hunger and fullness using the Hunger Scale. Patients will also discuss reasons for non-hunger eating and learn strategies to use for controlling emotional eating.  Targeting Your Nutrition Priorities Clinical staff led group instruction and group discussion with PowerPoint presentation and patient guidebook. To enhance the learning environment the use of posters, models and videos may be added. Patients will learn how to determine their genetic susceptibility to disease by reviewing their family history. Patients will gain insight into the importance of diet as part of an overall healthy lifestyle in mitigating the impact of genetics and other environmental insults. The purpose of this lesson is to provide patients with the opportunity to assess their personal nutrition priorities by looking at their family history, their own health history and current risk factors. Patients will also be able to discuss ways of prioritizing and modifying the Pritikin Eating Plan for their highest risk areas  Menu  Clinical staff led group instruction and group discussion with PowerPoint presentation and patient guidebook. To enhance the learning environment the use of posters, models and videos may be added.  Using menus brought in from [employer], or printed from [employer], patients will apply the Pritikin dining out guidelines that were presented in the  [employer] video. Patients will also be able to practice these guidelines in a variety of provided scenarios. The purpose of this lesson is to provide patients with the opportunity to practice hands-on learning of the Pritikin Dining Out guidelines with actual menus and practice scenarios.  Label Reading Clinical staff led group instruction and group discussion with PowerPoint presentation and patient guidebook. To enhance the learning environment the use of posters, models and videos may be added. Patients will review and discuss the Pritikin label reading guidelines presented in Pritikin's Label Reading Educational series video. Using fool labels brought in from local grocery stores and markets, patients will apply the label reading guidelines and determine if the packaged food meet the Pritikin guidelines. The purpose of this lesson is to provide patients with the opportunity to review, discuss, and practice hands-on learning of the Pritikin Label Reading guidelines with actual packaged food labels. Cooking School  Pritikin's [employer] are designed to teach patients ways to prepare quick, simple, and affordable recipes at home. The importance of nutrition's role in chronic disease risk reduction is reflected in its emphasis in the overall Pritikin program. By learning how to prepare essential core Pritikin Eating Plan recipes, patients will increase control over what they eat; be able to customize the flavor of foods without the use of added salt, sugar, or fat; and improve the quality of the food they consume. By learning a set of core recipes which are easily assembled, quickly prepared, and affordable, patients are more likely to prepare more healthy foods at home. These workshops focus on convenient breakfasts, simple entres, side dishes, and desserts which can be prepared with minimal effort and are consistent with nutrition recommendations for cardiovascular risk  reduction. Cooking [employer] are taught by a Armed forces logistics/support/administrative officer (RD) who has been trained by the [employer]. The chef or RD has a clear understanding of the importance of minimizing - if not completely eliminating - added fat, sugar, and sodium in recipes. Throughout the series of Cooking School Workshop sessions, patients will learn about healthy ingredients and efficient methods of cooking to build confidence in their capability to prepare    Cooking School weekly topics:  Adding Flavor- Sodium-Free  Fast and Healthy Breakfasts  Powerhouse Plant-Based Proteins  Satisfying Salads and Dressings  Simple Sides and Sauces  International Cuisine-Spotlight on the [employer] Zones  Delicious Desserts  Savory Soups  [employer] - Meals in a Astronomer Appetizers and Snacks  Comforting Weekend Breakfasts  One-Pot Wonders   Fast Evening Meals  Landscape architect Your Pritikin Plate  WORKSHOPS   Healthy Mindset (Psychosocial):  Focused Goals, Sustainable Changes Clinical staff led group instruction and group discussion with PowerPoint presentation and patient guidebook. To enhance the learning environment the use of posters, models and videos may be added. Patients will be able to apply effective goal setting strategies to establish at least one personal goal, and then take consistent, meaningful action toward that goal. They will learn to identify common barriers to achieving personal goals and develop strategies to overcome them. Patients will also gain an understanding of how our mind-set can impact our ability to achieve goals and the importance of cultivating a positive and growth-oriented mind-set. The purpose of this lesson is to provide patients with a deeper understanding  of how to set and achieve personal goals, as well as the tools and strategies needed to overcome common obstacles which may arise along the way.  From Head to Heart: The  Power of a Healthy Outlook  Clinical staff led group instruction and group discussion with PowerPoint presentation and patient guidebook. To enhance the learning environment the use of posters, models and videos may be added. Patients will be able to recognize and describe the impact of emotions and mood on physical health. They will discover the importance of self-care and explore self-care practices which may work for them. Patients will also learn how to utilize the 4 C's to cultivate a healthier outlook and better manage stress and challenges. The purpose of this lesson is to demonstrate to patients how a healthy outlook is an essential part of maintaining good health, especially as they continue their cardiac rehab journey.  Healthy Sleep for a Healthy Heart Clinical staff led group instruction and group discussion with PowerPoint presentation and patient guidebook. To enhance the learning environment the use of posters, models and videos may be added. At the conclusion of this workshop, patients will be able to demonstrate knowledge of the importance of sleep to overall health, well-being, and quality of life. They will understand the symptoms of, and treatments for, common sleep disorders. Patients will also be able to identify daytime and nighttime behaviors which impact sleep, and they will be able to apply these tools to help manage sleep-related challenges. The purpose of this lesson is to provide patients with a general overview of sleep and outline the importance of quality sleep. Patients will learn about a few of the most common sleep disorders. Patients will also be introduced to the concept of "sleep hygiene," and discover ways to self-manage certain sleeping problems through simple daily behavior changes. Finally, the workshop will motivate patients by clarifying the links between quality sleep and their goals of heart-healthy living.   Recognizing and Reducing Stress Clinical staff led  group instruction and group discussion with PowerPoint presentation and patient guidebook. To enhance the learning environment the use of posters, models and videos may be added. At the conclusion of this workshop, patients will be able to understand the types of stress reactions, differentiate between acute and chronic stress, and recognize the impact that chronic stress has on their health. They will also be able to apply different coping mechanisms, such as reframing negative self-talk. Patients will have the opportunity to practice a variety of stress management techniques, such as deep abdominal breathing, progressive muscle relaxation, and/or guided imagery.  The purpose of this lesson is to educate patients on the role of stress in their lives and to provide healthy techniques for coping with it.  Learning Barriers/Preferences:  Learning Barriers/Preferences - 10/22/23 1112       Learning Barriers/Preferences   Learning Barriers Sight;Hearing    Learning Preferences Computer/Internet;Group Instruction;Individual Instruction;Skilled Demonstration;Pictoral;Written Material             Education Topics:  Knowledge Questionnaire Score:  Knowledge Questionnaire Score - 10/22/23 1113       Knowledge Questionnaire Score   Pre Score 19/24             Core Components/Risk Factors/Patient Goals at Admission:  Personal Goals and Risk Factors at Admission - 10/22/23 1114       Core Components/Risk Factors/Patient Goals on Admission    Weight Management Yes;Weight Loss    Intervention Weight Management: Develop a combined nutrition and exercise program designed  to reach desired caloric intake, while maintaining appropriate intake of nutrient and fiber, sodium and fats, and appropriate energy expenditure required for the weight goal.;Weight Management: Provide education and appropriate resources to help participant work on and attain dietary goals.    Expected Outcomes Short Term:  Continue to assess and modify interventions until short term weight is achieved;Long Term: Adherence to nutrition and physical activity/exercise program aimed toward attainment of established weight goal;Weight Loss: Understanding of general recommendations for a balanced deficit meal plan, which promotes 1-2 lb weight loss per week and includes a negative energy balance of [phone] kcal/d;Understanding of distribution of calorie intake throughout the day with the consumption of 4-5 meals/snacks;Understanding recommendations for meals to include 15-35% energy as protein, 25-35% energy from fat, 35-60% energy from carbohydrates, less than 200mg  of dietary cholesterol, 20-35 gm of total fiber daily    Diabetes Yes    Intervention Provide education about signs/symptoms and action to take for hypo/hyperglycemia.;Provide education about proper nutrition, including hydration, and aerobic/resistive exercise prescription along with prescribed medications to achieve blood glucose in normal ranges: Fasting glucose 65-99 mg/dL    Expected Outcomes Short Term: Participant verbalizes understanding of the signs/symptoms and immediate care of hyper/hypoglycemia, proper foot care and importance of medication, aerobic/resistive exercise and nutrition plan for blood glucose control.;Long Term: Attainment of HbA1C < 7%.    Hypertension Yes    Intervention Provide education on lifestyle modifcations including regular physical activity/exercise, weight management, moderate sodium restriction and increased consumption of fresh fruit, vegetables, and low fat dairy, alcohol moderation, and smoking cessation.;Monitor prescription use compliance.    Expected Outcomes Short Term: Continued assessment and intervention until BP is < 140/4mm HG in hypertensive participants. < 130/10mm HG in hypertensive participants with diabetes, heart failure or chronic kidney disease.;Long Term: Maintenance of blood pressure at goal levels.    Lipids  Yes    Intervention Provide education and support for participant on nutrition & aerobic/resistive exercise along with prescribed medications to achieve LDL 70mg , HDL >40mg .    Expected Outcomes Short Term: Participant states understanding of desired cholesterol values and is compliant with medications prescribed. Participant is following exercise prescription and nutrition guidelines.;Long Term: Cholesterol controlled with medications as prescribed, with individualized exercise RX and with personalized nutrition plan. Value goals: LDL < 70mg , HDL > 40 mg.             Core Components/Risk Factors/Patient Goals Review:   Goals and Risk Factor Review     Row Name 10/24/23 1701             Core Components/Risk Factors/Patient Goals Review   Personal Goals Review Weight Management/Obesity;Hypertension;Lipids;Diabetes       Review Sarah started cardiac rehab on 10/24/23. Richmond did well with exercise. Vital signs were stable.       Expected Outcomes Meldon will continue to participate in cardiac rehab for exercise, nutrition and lifestyle modifications                Core Components/Risk Factors/Patient Goals at Discharge (Final Review):   Goals and Risk Factor Review - 10/24/23 1701       Core Components/Risk Factors/Patient Goals Review   Personal Goals Review Weight Management/Obesity;Hypertension;Lipids;Diabetes    Review Ciel started cardiac rehab on 10/24/23. Sian did well with exercise. Vital signs were stable.    Expected Outcomes Loren will continue to participate in cardiac rehab for exercise, nutrition and lifestyle modifications             ITP  Comments:  ITP Comments     Row Name 10/22/23 1039 10/24/23 1700         ITP Comments Dr. Armanda Magic medical director. Introduction to pritikin education/ intensive cardiac rehab. Initial orientation packet reviewed with patient. 30 Day ITP Review. Ronold started cardiac rehab on 10/24/23 and did well with exercise.                Comments: Pt started cardiac rehab today.  Pt tolerated light exercise without difficulty. VSS, telemetry-Sinus Rhythm, asymptomatic.  Medication list reconciled. Pt denies barriers to medicaiton compliance.  PSYCHOSOCIAL ASSESSMENT:  PHQ-0. Pt exhibits positive coping skills, hopeful outlook with supportive family. No psychosocial needs identified at this time, no psychosocial interventions necessary.    Pt enjoys home repairs, watching TV and spending time with family.   Pt oriented to exercise equipment and routine.    Understanding verbalized.Thayer Headings RN BSN

## 2023-10-27 ENCOUNTER — Encounter (HOSPITAL_COMMUNITY)
Admission: RE | Admit: 2023-10-27 | Discharge: 2023-10-27 | Disposition: A | Discharge status: Home / Self Care | Payer: Medicare Other | Source: Ambulatory Visit | Attending: Cardiovascular Disease

## 2023-10-27 DIAGNOSIS — Z955 Presence of coronary angioplasty implant and graft: Secondary | ICD-10-CM

## 2023-10-27 DIAGNOSIS — Z48812 Encounter for surgical aftercare following surgery on the circulatory system: Secondary | ICD-10-CM | POA: Diagnosis not present

## 2023-10-29 ENCOUNTER — Encounter (HOSPITAL_COMMUNITY): Admission: RE | Admit: 2023-10-29 | Payer: Medicare Other | Source: Ambulatory Visit

## 2023-10-31 ENCOUNTER — Encounter (HOSPITAL_COMMUNITY)
Admission: RE | Admit: 2023-10-31 | Discharge: 2023-10-31 | Disposition: A | Discharge status: Home / Self Care | Payer: Medicare Other | Source: Ambulatory Visit | Attending: Cardiovascular Disease

## 2023-10-31 DIAGNOSIS — Z48812 Encounter for surgical aftercare following surgery on the circulatory system: Secondary | ICD-10-CM | POA: Diagnosis not present

## 2023-10-31 DIAGNOSIS — Z955 Presence of coronary angioplasty implant and graft: Secondary | ICD-10-CM

## 2023-11-03 ENCOUNTER — Encounter (HOSPITAL_COMMUNITY)
Admission: RE | Admit: 2023-11-03 | Discharge: 2023-11-03 | Disposition: A | Discharge status: Home / Self Care | Payer: Medicare Other | Source: Ambulatory Visit | Attending: Cardiovascular Disease | Admitting: Cardiovascular Disease

## 2023-11-03 DIAGNOSIS — Z48812 Encounter for surgical aftercare following surgery on the circulatory system: Secondary | ICD-10-CM | POA: Diagnosis not present

## 2023-11-03 DIAGNOSIS — Z955 Presence of coronary angioplasty implant and graft: Secondary | ICD-10-CM

## 2023-11-05 ENCOUNTER — Encounter (HOSPITAL_COMMUNITY)
Admission: RE | Admit: 2023-11-05 | Discharge: 2023-11-05 | Disposition: A | Discharge status: Home / Self Care | Payer: Medicare Other | Source: Ambulatory Visit | Attending: Cardiovascular Disease | Admitting: Cardiovascular Disease

## 2023-11-05 DIAGNOSIS — Z48812 Encounter for surgical aftercare following surgery on the circulatory system: Secondary | ICD-10-CM | POA: Diagnosis not present

## 2023-11-05 DIAGNOSIS — Z955 Presence of coronary angioplasty implant and graft: Secondary | ICD-10-CM

## 2023-11-07 ENCOUNTER — Encounter (HOSPITAL_COMMUNITY): Payer: Medicare Other

## 2023-11-10 ENCOUNTER — Encounter (HOSPITAL_COMMUNITY)
Admission: RE | Admit: 2023-11-10 | Discharge: 2023-11-10 | Disposition: A | Discharge status: Home / Self Care | Payer: Medicare Other | Source: Ambulatory Visit | Attending: Cardiovascular Disease | Admitting: Cardiovascular Disease

## 2023-11-10 DIAGNOSIS — Z48812 Encounter for surgical aftercare following surgery on the circulatory system: Secondary | ICD-10-CM | POA: Insufficient documentation

## 2023-11-10 DIAGNOSIS — Z955 Presence of coronary angioplasty implant and graft: Secondary | ICD-10-CM | POA: Diagnosis present

## 2023-11-12 ENCOUNTER — Encounter (HOSPITAL_COMMUNITY)
Admission: RE | Admit: 2023-11-12 | Discharge: 2023-11-12 | Disposition: A | Discharge status: Home / Self Care | Payer: Medicare Other | Source: Ambulatory Visit | Attending: Cardiovascular Disease

## 2023-11-12 DIAGNOSIS — Z955 Presence of coronary angioplasty implant and graft: Secondary | ICD-10-CM

## 2023-11-12 NOTE — Progress Notes (Signed)
Left message to call back  

## 2023-11-12 NOTE — Progress Notes (Addendum)
Incomplete Session Note  Patient Details  Name: Fred [name] MRN: [id number] Date of Birth: [birth year] Referring Provider:   Flowsheet Row INTENSIVE CARDIAC REHAB ORIENT from 10/22/2023 in [facility]  Referring Provider Nanetta Batty, MD       [name] did not complete his rehab session.  Patient reported feeling lightheaded and woozy yesterday.  Today he feels okay. Initial blood pressure 88/48. Recheck BP 87/50. Heart rate 68. Telemetry rhythm Sinus 63. [name] said he fell backward on Thanksgiving day while blowing the leaves. [name] says he was not lightheaded at this time. [name] said that he had a croissant with coffee. Patient ate an orange prior to coming to cardiac rehab. Weight is down 1.5 kg since starting cardiac rehab. Repeat blood pressure 94/60 sitting. Standing BP 94/50. Resting blood pressures have been mainly WNL at cardiac rehab. Encouraged patient to make sure he hydrates enough prior to coming to exercise at cardiac rehab. Will consult onsite provider Edd Fabian NP.   Per Edd Fabian NP. I agree with encouraging increased hydration. He may exercise. Thank you.  1 min  Will forward today's vital signs to [name] [facility] for review.   [name] left cardiac rehab without symptoms. Will continue to monitor the patient throughout  the program.Shulamis Wenberg Mila Palmer RN BSN

## 2023-11-12 NOTE — Progress Notes (Signed)
PER HAO MENG, PA-C: recommend stop metoprolol, hydrate. keep twice a day BP diary. BMET and CBC. Follow up with me in 1 week. Please. thank you

## 2023-11-14 ENCOUNTER — Encounter (HOSPITAL_COMMUNITY)
Admission: RE | Admit: 2023-11-14 | Discharge: 2023-11-14 | Disposition: A | Discharge status: Home / Self Care | Payer: Medicare Other | Source: Ambulatory Visit | Attending: Cardiovascular Disease | Admitting: Cardiovascular Disease

## 2023-11-14 ENCOUNTER — Telehealth: Payer: Self-pay | Admitting: Cardiovascular Disease

## 2023-11-14 DIAGNOSIS — Z955 Presence of coronary angioplasty implant and graft: Secondary | ICD-10-CM | POA: Diagnosis not present

## 2023-11-14 DIAGNOSIS — Z79899 Other long term (current) drug therapy: Secondary | ICD-10-CM

## 2023-11-14 NOTE — Telephone Encounter (Signed)
Pt states he is returning call to a nurse

## 2023-11-14 NOTE — Telephone Encounter (Signed)
Patient identification verified by 2 forms. Marilynn Rail, RN    Called and spoke to patient  Patient states:   -Wednesday BP was low while in cardiac rehab 88/58? Received water   -Received a call to call back the office   -will be presenting to cardiac rehab today   -took dose of metoprolol this morning  Informed patient:   -Per chart review plan is to stop metoprolol, hydrate, BMET & CBC, check BP twice daily and follow up in 1 week with PA Azalee Course   -RN will return call to confirm appointment  Patient verbalized understanding, no questions at this time

## 2023-11-14 NOTE — Telephone Encounter (Signed)
Patient identification verified by 2 forms. Marilynn Rail, RN    Called and spoke to patient  Patient agrees with 12/19 appointment at 9:15 with PA [facility]  Informed patient lab orders placed Patient plans to present to lab in 1 week  Patient agrees with plan, no questions at this time

## 2023-11-17 ENCOUNTER — Encounter (HOSPITAL_COMMUNITY)
Admission: RE | Admit: 2023-11-17 | Discharge: 2023-11-17 | Disposition: A | Discharge status: Home / Self Care | Payer: Medicare Other | Source: Ambulatory Visit | Attending: Cardiovascular Disease | Admitting: Cardiovascular Disease

## 2023-11-17 DIAGNOSIS — Z955 Presence of coronary angioplasty implant and graft: Secondary | ICD-10-CM

## 2023-11-19 ENCOUNTER — Encounter (HOSPITAL_COMMUNITY)
Admission: RE | Admit: 2023-11-19 | Discharge: 2023-11-19 | Disposition: A | Discharge status: Home / Self Care | Payer: Medicare Other | Source: Ambulatory Visit | Attending: Cardiovascular Disease | Admitting: Cardiovascular Disease

## 2023-11-19 ENCOUNTER — Telehealth (HOSPITAL_COMMUNITY): Payer: Self-pay

## 2023-11-19 DIAGNOSIS — Z955 Presence of coronary angioplasty implant and graft: Secondary | ICD-10-CM

## 2023-11-19 NOTE — Progress Notes (Signed)
Cardiac Individual Treatment Plan  Patient Details  Name: Fred [name] MRN: [id number] Date of Birth: 06/20/1941 Referring Provider:   Flowsheet Row INTENSIVE CARDIAC REHAB ORIENT from 10/22/2023 in [facility] Heart, Vascular, & Lung [facility]  Referring Provider Nanetta Batty, MD       Initial Encounter Date:  Flowsheet Row INTENSIVE CARDIAC REHAB ORIENT from 10/22/2023 in [facility] for Heart, Vascular, & Lung [facility]  Date 10/22/23       Visit Diagnosis: 09/18/23 DES SVG-OM  Patient's Home Medications on Admission:  Current Outpatient Medications:    Accu-Chek FastClix Lancets MISC, USE 1 TO 4 TIMES DAILY AS  NEEDED/DIRECTED, Disp: 408 each, Rfl: 3   ACCU-CHEK GUIDE test strip, USE TO CHECK BLOOD SUGAR DAILY  AS DIRECTED, Disp: 100 strip, Rfl: 10   acetaminophen (TYLENOL) 650 MG CR tablet, Take 1,300 mg by mouth 2 (two) times daily., Disp: , Rfl:    aspirin 81 MG tablet, Take 1 tablet (81 mg total) by mouth daily., Disp: , Rfl:    Blood Glucose Monitoring Suppl (ACCU-CHEK AVIVA) device, by Other route. Use as instructed 1 time daily, Disp: , Rfl:    clopidogrel (PLAVIX) 75 MG tablet, Take 1 tablet (75 mg total) by mouth daily with breakfast., Disp: 90 tablet, Rfl: 3   clotrimazole (LOTRIMIN) 1 % cream, Apply 1 application  topically daily as needed (Groin)., Disp: , Rfl:    Continuous Glucose Sensor (DEXCOM G6 SENSOR) MISC, by Does not apply route., Disp: , Rfl:    dapagliflozin propanediol (FARXIGA) 10 MG TABS tablet, Take 1 tablet (10 mg total) by mouth daily before breakfast., Disp: 90 tablet, Rfl: 3   Insulin Pen Needle 32G X 4 MM MISC, 1 Device by Does not apply route daily., Disp: 100 each, Rfl: 3   levothyroxine (SYNTHROID) 25 MCG tablet, Take 1 tablet (25 mcg total) by mouth daily before breakfast., Disp: 90 tablet, Rfl: 3   metFORMIN (GLUCOPHAGE) 1000 MG tablet, Take 1 tablet (1,000 mg total) by mouth 2 (two) times  daily with a meal., Disp: 180 tablet, Rfl: 3   metoprolol tartrate (LOPRESSOR) 25 MG tablet, TAKE ONE-HALF TABLET BY MOUTH  TWICE DAILY, Disp: 90 tablet, Rfl: 3   Multiple Vitamins-Minerals (MULTIVITAMIN WITH MINERALS) tablet, Take 1 tablet by mouth daily., Disp: , Rfl:    nitroGLYCERIN (NITROSTAT) 0.4 MG SL tablet, Place 1 tablet (0.4 mg total) under the tongue every 5 (five) minutes as needed for chest pain., Disp: 25 tablet, Rfl: 3   rosuvastatin (CRESTOR) 20 MG tablet, Take 1 tablet (20 mg total) by mouth daily., Disp: 90 tablet, Rfl: 3   Semaglutide, 1 MG/DOSE, 4 MG/3ML SOPN, Inject 1 mg as directed once a week., Disp: 9 mL, Rfl: 3   solifenacin (VESICARE) 10 MG tablet, Take 10 mg by mouth daily., Disp: , Rfl:    tamsulosin (FLOMAX) 0.4 MG CAPS capsule, Take 0.4 mg by mouth daily., Disp: , Rfl:    terbinafine (LAMISIL) 250 MG tablet, Take 250 mg by mouth daily., Disp: , Rfl:    TOUJEO MAX SOLOSTAR 300 UNIT/ML Solostar Pen, Inject 40 Units into the skin daily., Disp: 15 mL, Rfl: 6  Past Medical History: Past Medical History:  Diagnosis Date   Coronary artery disease    Diabetes mellitus without complication (HCC)    High cholesterol    Vertigo     Tobacco Use: Social History   Tobacco Use  Smoking Status Former  Current packs/day: 0.00   Types: Cigarettes   Start date: 12/1952   Quit date: 12/1962   Years since quitting: 60.9  Smokeless Tobacco Never    Labs: Review Flowsheet  More data exists      Latest Ref Rng & Units 10/04/2021 02/04/2022 06/03/2022 12/04/2022 06/05/2023  Labs for ITP Cardiac and Pulmonary Rehab  Cholestrol 0 - 200 mg/dL - - - 161  -  Direct LDL mg/dL - - - 09.6  -  HDL-C >04.54 mg/dL - - - 09.81  -  Trlycerides 0.0 - 149.0 mg/dL - - - 191.4  -  Hemoglobin A1c 4.0 - 5.6 % 8.0  9.5  8.1  8.5  7.3     Details            Capillary Blood Glucose: Lab Results  Component Value Date   GLUCAP 146 (H) 10/24/2023   GLUCAP 122 (H) 10/24/2023    GLUCAP 159 (H) 10/22/2023   GLUCAP 105 (H) 09/18/2023   GLUCAP 137 (H) 09/18/2023     Exercise Target Goals: Exercise Program Goal: Individual exercise prescription set using results from initial 6 min walk test and THRR while considering  patient's activity barriers and safety.   Exercise Prescription Goal: Initial exercise prescription builds to 30-45 minutes a day of aerobic activity, 2-3 days per week.  Home exercise guidelines will be given to patient during program as part of exercise prescription that the participant will acknowledge.  Activity Barriers & Risk Stratification:  Activity Barriers & Cardiac Risk Stratification - 10/22/23 1108       Activity Barriers & Cardiac Risk Stratification   Activity Barriers Arthritis;Balance Concerns;Deconditioning;Joint Problems    Cardiac Risk Stratification High   <5 METs on            6 Minute Walk:  6 Minute Walk     Row Name 10/22/23 1214         6 Minute Walk   Phase Initial     Distance 1350 feet     Walk Time 6 minutes     # of Rest Breaks 0     MPH 2.56     METS 1.89     RPE 11     Perceived Dyspnea  0     VO2 Peak 6.61     Symptoms No     Resting HR 72 bpm     Resting BP 102/60     Resting Oxygen Saturation  98 %     Exercise Oxygen Saturation  during 6 min walk 98 %     Max Ex. HR 81 bpm     Max Ex. BP 114/62     2 Minute Post BP 100/54              Oxygen Initial Assessment:   Oxygen Re-Evaluation:   Oxygen Discharge (Final Oxygen Re-Evaluation):   Initial Exercise Prescription:  Initial Exercise Prescription - 10/22/23 1200       Date of Initial Exercise RX and Referring Provider   Date 10/22/23    Referring Provider Nanetta Batty, MD    Expected Discharge Date 01/14/24      NuStep   Level 1    SPM 70    Minutes 15    METs 2      Track   Laps 16    Minutes 15    METs 2      Prescription Details   Frequency (times per week) 3  Duration Progress to 30 minutes of  continuous aerobic without signs/symptoms of physical distress      Intensity   THRR 40-80% of Max Heartrate 55-110    Ratings of Perceived Exertion 11-13    Perceived Dyspnea 0-4      Progression   Progression Continue to progress workloads to maintain intensity without signs/symptoms of physical distress.      Resistance Training   Training Prescription Yes    Weight 3    Reps 10-15             Perform Capillary Blood Glucose checks as needed.  Exercise Prescription Changes:   Exercise Prescription Changes     Row Name 10/24/23 1253 11/14/23 1400           Response to Exercise   Blood Pressure (Admit) 108/72 114/80      Blood Pressure (Exercise) 112/58 112/52      Blood Pressure (Exit) 114/70 102/70      Heart Rate (Admit) 69 bpm 77 bpm      Heart Rate (Exercise) 91 bpm 94 bpm      Heart Rate (Exit) 77 bpm 82 bpm      Rating of Perceived Exertion (Exercise) 11 11      Symptoms None None      Comments Off to a good start with exercise. Reviewed METs      Duration Continue with 30 min of aerobic exercise without signs/symptoms of physical distress. Continue with 30 min of aerobic exercise without signs/symptoms of physical distress.      Intensity THRR unchanged THRR unchanged        Progression   Progression Continue to progress workloads to maintain intensity without signs/symptoms of physical distress. Continue to progress workloads to maintain intensity without signs/symptoms of physical distress.      Average METs 2.4 2.9        Resistance Training   Training Prescription Yes Yes      Weight 3 lbs 3 lbs      Reps 10-15 10-15      Time 10 Minutes 10 Minutes        Interval Training   Interval Training No No        NuStep   Level 1 2      SPM 83 100      Minutes 15 15      METs 2 2.6        Track   Laps 15 17      Minutes 15 15      METs 2.91 3.17               Exercise Comments:   Exercise Comments     Row Name 10/24/23 1347 11/14/23  1544         Exercise Comments Todd tolerated low intensity exercise well without symptoms. Reviewed METs with patient today. Increased workload on nustep today. Pt is making progress.               Exercise Goals and Review:   Exercise Goals     Row Name 10/22/23 1111             Exercise Goals   Increase Physical Activity Yes       Intervention Provide advice, education, support and counseling about physical activity/exercise needs.;Develop an individualized exercise prescription for aerobic and resistive training based on initial evaluation findings, risk stratification, comorbidities and participant's personal goals.       Expected  Outcomes Short Term: Attend rehab on a regular basis to increase amount of physical activity.;Long Term: Exercising regularly at least 3-5 days a week.;Long Term: Add in home exercise to make exercise part of routine and to increase amount of physical activity.       Increase Strength and Stamina Yes       Intervention Provide advice, education, support and counseling about physical activity/exercise needs.;Develop an individualized exercise prescription for aerobic and resistive training based on initial evaluation findings, risk stratification, comorbidities and participant's personal goals.       Expected Outcomes Long Term: Improve cardiorespiratory fitness, muscular endurance and strength as measured by increased METs and functional capacity ( );Short Term: Increase workloads from initial exercise prescription for resistance, speed, and METs.;Short Term: Perform resistance training exercises routinely during rehab and add in resistance training at home       Able to understand and use rate of perceived exertion (RPE) scale Yes       Intervention Provide education and explanation on how to use RPE scale       Expected Outcomes Short Term: Able to use RPE daily in rehab to express subjective intensity level;Long Term:  Able to use RPE to guide  intensity level when exercising independently       Knowledge and understanding of Target Heart Rate Range (THRR) Yes       Intervention Provide education and explanation of THRR including how the numbers were predicted and where they are located for reference       Expected Outcomes Short Term: Able to state/look up THRR;Short Term: Able to use daily as guideline for intensity in rehab;Long Term: Able to use THRR to govern intensity when exercising independently       Understanding of Exercise Prescription Yes       Intervention Provide education, explanation, and written materials on patient's individual exercise prescription       Expected Outcomes Short Term: Able to explain program exercise prescription;Long Term: Able to explain home exercise prescription to exercise independently                Exercise Goals Re-Evaluation :  Exercise Goals Re-Evaluation     Row Name 10/24/23 1347             Exercise Goal Re-Evaluation   Exercise Goals Review Increase Physical Activity;Increase Strength and Stamina;Able to understand and use rate of perceived exertion (RPE) scale       Comments Ramesh was able to understand and use RPE scale appropriately.       Expected Outcomes Progress workloads as tolerated to help increase strength and stamina.                Discharge Exercise Prescription (Final Exercise Prescription Changes):  Exercise Prescription Changes - 11/14/23 1400       Response to Exercise   Blood Pressure (Admit) 114/80    Blood Pressure (Exercise) 112/52    Blood Pressure (Exit) 102/70    Heart Rate (Admit) 77 bpm    Heart Rate (Exercise) 94 bpm    Heart Rate (Exit) 82 bpm    Rating of Perceived Exertion (Exercise) 11    Symptoms None    Comments Reviewed METs    Duration Continue with 30 min of aerobic exercise without signs/symptoms of physical distress.    Intensity THRR unchanged      Progression   Progression Continue to progress workloads to maintain  intensity without signs/symptoms of physical distress.  Average METs 2.9      Resistance Training   Training Prescription Yes    Weight 3 lbs    Reps 10-15    Time 10 Minutes      Interval Training   Interval Training No      NuStep   Level 2    SPM 100    Minutes 15    METs 2.6      Track   Laps 17    Minutes 15    METs 3.17             Nutrition:  Target Goals: Understanding of nutrition guidelines, daily intake of sodium 1500mg , cholesterol 200mg , calories 30% from fat and 7% or less from saturated fats, daily to have 5 or more servings of fruits and vegetables.  Biometrics:  Pre Biometrics - 10/22/23 1038       Pre Biometrics   Waist Circumference 44 inches    Hip Circumference 43.5 inches    Waist to Hip Ratio 1.01 %    Triceps Skinfold 8 mm    % Body Fat 28.2 %    Grip Strength 28 kg    Flexibility 0 in   could not reach   Single Leg Stand 12.87 seconds              Nutrition Therapy Plan and Nutrition Goals:  Nutrition Therapy & Goals - 10/24/23 1451       Nutrition Therapy   Diet Heart healthy diet    Drug/Food Interactions Statins/Certain Fruits      Personal Nutrition Goals   Nutrition Goal Patient to identify strategies for reducing cardiovascular risk by attending the [employer]rit[employer] education and nutrition series weekly.    Personal Goal #2 Patient to improve diet quality by using the plate method as a guide for meal planning to include lean protein/plant protein, fruits, vegetables, whole grains, nonfat dairy as part of a well-balanced diet.    Comments Patient has medical history of CAD, DM2, Hyperlipidemia, s/p CABGx4, His A1c remains above goal at 7.3. HIs LDl remains well controlled but triglycerides are elevated. Patient will benefit from participation in intensive cardiac rehab for nutrition, exercise, and lifestyle modification.      Intervention Plan   Intervention Prescribe, educate and counsel regarding individualized specific  dietary modifications aiming towards targeted core components such as weight, hypertension, lipid management, diabetes, heart failure and other comorbidities.;Nutrition handout(s) given to patient.    Expected Outcomes Short Term Goal: Understand basic principles of dietary content, such as calories, fat, sodium, cholesterol and nutrients.;Long Term Goal: Adherence to prescribed nutrition plan.             Nutrition Assessments:  Nutrition Assessments - 10/31/23 1528       Rate Your Plate Scores   Pre Score 59            MEDIFICTS Score Key: >=70 Need to make dietary changes  40-70 Heart Healthy Diet <= 40 Therapeutic Level Cholesterol Diet   Flowsheet Row INTENSIVE CARDIAC REHAB from 10/31/2023 in [facility] for Heart, Vascular, & Lung Health  Picture Your Plate Total Score on Admission 59      Picture Your Plate Scores: <16 Unhealthy dietary pattern with much room for improvement. 41-50 Dietary pattern unlikely to meet recommendations for good health and room for improvement. 51-60 More healthful dietary pattern, with some room for improvement.  >60 Healthy dietary pattern, although there may be some specific behaviors that could be  improved.    Nutrition Goals Re-Evaluation:  Nutrition Goals Re-Evaluation     Row Name 10/24/23 1451             Goals   Current Weight 184 lb 4.9 oz (83.6 kg)       Comment A1c 7.3, LDL 51, HDL 32, triglycerides 397       Expected Outcome Patient has medical history of CAD, DM2, Hyperlipidemia, s/p CABGx4, His A1c remains above goal at 7.3. HIs LDl remains well controlled but triglycerides are elevated. Patient will benefit from participation in intensive cardiac rehab for nutrition, exercise, and lifestyle modification.                Nutrition Goals Re-Evaluation:  Nutrition Goals Re-Evaluation     Row Name 10/24/23 1451             Goals   Current Weight 184 lb 4.9 oz (83.6 kg)        Comment A1c 7.3, LDL 51, HDL 32, triglycerides 397       Expected Outcome Patient has medical history of CAD, DM2, Hyperlipidemia, s/p CABGx4, His A1c remains above goal at 7.3. HIs LDl remains well controlled but triglycerides are elevated. Patient will benefit from participation in intensive cardiac rehab for nutrition, exercise, and lifestyle modification.                Nutrition Goals Discharge (Final Nutrition Goals Re-Evaluation):  Nutrition Goals Re-Evaluation - 10/24/23 1451       Goals   Current Weight 184 lb 4.9 oz (83.6 kg)    Comment A1c 7.3, LDL 51, HDL 32, triglycerides 397    Expected Outcome Patient has medical history of CAD, DM2, Hyperlipidemia, s/p CABGx4, His A1c remains above goal at 7.3. HIs LDl remains well controlled but triglycerides are elevated. Patient will benefit from participation in intensive cardiac rehab for nutrition, exercise, and lifestyle modification.             Psychosocial: Target Goals: Acknowledge presence or absence of significant depression and/or stress, maximize coping skills, provide positive support system. Participant is able to verbalize types and ability to use techniques and skills needed for reducing stress and depression.  Initial Review & Psychosocial Screening:  Initial Psych Review & Screening - 10/22/23 1112       Initial Review   Current issues with None Identified      Family Dynamics   Good Support System? Yes   Gabe has his daughters for support     Barriers   Psychosocial barriers to participate in program There are no identifiable barriers or psychosocial needs.      Screening Interventions   Interventions Encouraged to exercise;Provide feedback about the scores to participant    Expected Outcomes Short Term goal: Identification and review with participant of any Quality of Life or Depression concerns found by scoring the questionnaire.;Long Term goal: The participant improves quality of Life and PHQ9 Scores  as seen by post scores and/or verbalization of changes             Quality of Life Scores:  Quality of Life - 10/22/23 1122       Quality of Life   Select Quality of Life      Quality of Life Scores   Health/Function Pre 27.43 %    Socioeconomic Pre 26.25 %    Psych/Spiritual Pre 29.64 %    Family Pre 28 %    GLOBAL Pre 27.77 %  Scores of 19 and below usually indicate a poorer quality of life in these areas.  A difference of  2-3 points is a clinically meaningful difference.  A difference of 2-3 points in the total score of the Quality of Life Index has been associated with significant improvement in overall quality of life, self-image, physical symptoms, and general health in studies assessing change in quality of life.  PHQ-9: Review Flowsheet       10/22/2023 03/20/2020 01/06/2020  Depression screen PHQ 2/9  Decreased Interest 0 0 0  Down, Depressed, Hopeless 0 0 0  PHQ - 2 Score 0 0 0  Altered sleeping 0 - -  Tired, decreased energy 0 - -  Change in appetite 0 - -  Feeling bad or failure about yourself  0 - -  Trouble concentrating 0 - -  Moving slowly or fidgety/restless 0 - -  Suicidal thoughts 0 - -  PHQ-9 Score 0 - -  Difficult doing work/chores Not difficult at all - -    Details           Interpretation of Total Score  Total Score Depression Severity:  1-4 = Minimal depression, 5-9 = Mild depression, 10-14 = Moderate depression, 15-19 = Moderately severe depression, 20-27 = Severe depression   Psychosocial Evaluation and Intervention:   Psychosocial Re-Evaluation:  Psychosocial Re-Evaluation     Row Name 10/24/23 1701 11/19/23 1633           Psychosocial Re-Evaluation   Current issues with None Identified None Identified      Interventions Encouraged to attend Cardiac Rehabilitation for the exercise Encouraged to attend Cardiac Rehabilitation for the exercise      Continue Psychosocial Services  No Follow up required No Follow  up required               Psychosocial Discharge (Final Psychosocial Re-Evaluation):  Psychosocial Re-Evaluation - 11/19/23 1633       Psychosocial Re-Evaluation   Current issues with None Identified    Interventions Encouraged to attend Cardiac Rehabilitation for the exercise    Continue Psychosocial Services  No Follow up required             Vocational Rehabilitation: Provide vocational rehab assistance to qualifying candidates.   Vocational Rehab Evaluation & Intervention:  Vocational Rehab - 10/22/23 1114       Initial Vocational Rehab Evaluation & Intervention   Assessment shows need for Vocational Rehabilitation No   Charith is retired            Education: Education Goals: Education classes will be provided on a weekly basis, covering required topics. Participant will state understanding/return demonstration of topics presented.    Education     Row Name 10/24/23 1300     Education   Cardiac Education Topics Pritikin   Hospital doctor Education   General Education Heart Disease Risk Reduction   Instruction Review Code 1- Verbalizes Understanding   Class Start Time 1157   Class Stop Time 1234   Class Time Calculation (min) 37 min    Row Name 10/27/23 1300     Education   Cardiac Education Topics Pritikin   Geographical information systems officer Exercise   Exercise Workshop Location manager and Fall Prevention   Instruction Review Code 1- Verbalizes Understanding   Class Start Time 1150  Class Stop Time 1240   Class Time Calculation (min) 50 min    Row Name 10/31/23 1300     Education   Cardiac Education Topics Pritikin   Select Core Videos     Core Videos   Educator Dietitian   Select Nutrition   Nutrition Overview of the Pritikin Eating Plan   Instruction Review Code 1- Verbalizes Understanding   Class Start Time 1145    Class Stop Time 1221   Class Time Calculation (min) 36 min    Row Name 11/03/23 1300     Education   Cardiac Education Topics Pritikin   Nurse, children's Exercise Physiologist   Select Psychosocial   Psychosocial Healthy Minds, Bodies, Hearts   Instruction Review Code 1- Verbalizes Understanding   Class Start Time 1142   Class Stop Time 1218   Class Time Calculation (min) 36 min    Row Name 11/05/23 1200     Education   Cardiac Education Topics Pritikin   Select Core Videos     Core Videos   Educator Nurse   Select Nutrition   Nutrition Becoming a Pritikin Chef   Instruction Review Code 1- Verbalizes Understanding   Class Start Time 1146   Class Stop Time 1224   Class Time Calculation (min) 38 min    Row Name 11/10/23 1400     Education   Cardiac Education Topics Pritikin   Select Core Videos     Core Videos   Educator Exercise Physiologist   Select Nutrition   Nutrition Other   Instruction Review Code 1- Verbalizes Understanding   Class Start Time 1151   Class Stop Time 1227   Class Time Calculation (min) 36 min    Row Name 11/14/23 1500     Education   Cardiac Education Topics Pritikin   Glass blower/designer Nutrition   Nutrition Workshop Label Reading   Instruction Review Code 1- Verbalizes Understanding   Class Start Time 1144   Class Stop Time 1219   Class Time Calculation (min) 35 min    Row Name 11/17/23 1600     Education   Cardiac Education Topics Pritikin   Select Workshops     Workshops   Educator Exercise Physiologist   Select Psychosocial   Psychosocial Workshop Recognizing and Reducing Stress   Instruction Review Code 1- Verbalizes Understanding   Class Start Time 1142   Class Stop Time 1231   Class Time Calculation (min) 49 min    Row Name 11/19/23 1400     Education   Cardiac Education Topics Pritikin   Cabin crew   Weekly Topic Efficiency Cooking - Meals in a Snap   Instruction Review Code 1- Verbalizes Understanding   Class Start Time 1143   Class Stop Time 1226   Class Time Calculation (min) 43 min            Core Videos: Exercise    Move It!  Clinical staff conducted group or individual video education with verbal and written material and guidebook.  Patient learns the recommended Pritikin exercise program. Exercise with the goal of living a long, healthy life. Some of the health benefits of exercise include controlled diabetes, healthier blood pressure levels, improved cholesterol levels, improved heart and lung capacity, improved sleep, and better body composition. Everyone should speak  with their doctor before starting or changing an exercise routine.  Biomechanical Limitations Clinical staff conducted group or individual video education with verbal and written material and guidebook.  Patient learns how biomechanical limitations can impact exercise and how we can mitigate and possibly overcome limitations to have an impactful and balanced exercise routine.  Body Composition Clinical staff conducted group or individual video education with verbal and written material and guidebook.  Patient learns that body composition (ratio of muscle mass to fat mass) is a key component to assessing overall fitness, rather than body weight alone. Increased fat mass, especially visceral belly fat, can put [place] at increased risk for metabolic syndrome, type 2 diabetes, heart disease, and even death. It is recommended to combine diet and exercise (cardiovascular and resistance training) to improve your body composition. Seek guidance from your physician and exercise physiologist before implementing an exercise routine.  Exercise Action Plan Clinical staff conducted group or individual video education with verbal and written material and guidebook.  Patient learns the recommended strategies  to achieve and enjoy long-term exercise adherence, including variety, self-motivation, self-efficacy, and positive decision making. Benefits of exercise include fitness, good health, weight management, more energy, better sleep, less stress, and overall well-being.  Medical   Heart Disease Risk Reduction Clinical staff conducted group or individual video education with verbal and written material and guidebook.  Patient learns our heart is our most vital organ as it circulates oxygen, nutrients, white blood cells, and hormones throughout the entire body, and carries waste away. Data supports a plant-based eating plan like the Pritikin Program for its effectiveness in slowing progression of and reversing heart disease. The video provides a number of recommendations to address heart disease.   Metabolic Syndrome and Belly Fat  Clinical staff conducted group or individual video education with verbal and written material and guidebook.  Patient learns what metabolic syndrome is, how it leads to heart disease, and how one can reverse it and keep it from coming back. You have metabolic syndrome if you have 3 of the following 5 criteria: abdominal obesity, high blood pressure, high triglycerides, low HDL cholesterol, and high blood sugar.  Hypertension and Heart Disease Clinical staff conducted group or individual video education with verbal and written material and guidebook.  Patient learns that high blood pressure, or hypertension, is very common in the [place]. Hypertension is largely due to excessive salt intake, but other important risk factors include being overweight, physical inactivity, drinking too much alcohol, smoking, and not eating enough potassium from fruits and vegetables. High blood pressure is a leading risk factor for heart attack, stroke, congestive heart failure, dementia, kidney failure, and premature death. Long-term effects of excessive salt intake include stiffening of the  arteries and thickening of heart muscle and organ damage. Recommendations include ways to reduce hypertension and the risk of heart disease.  Diseases of Our Time - Focusing on Diabetes Clinical staff conducted group or individual video education with verbal and written material and guidebook.  Patient learns why the best way to stop diseases of our time is prevention, through food and other lifestyle changes. Medicine (such as prescription pills and surgeries) is often only a Band-Aid on the problem, not a long-term solution. Most common diseases of our time include obesity, type 2 diabetes, hypertension, heart disease, and cancer. The Pritikin Program is recommended and has been proven to help reduce, reverse, and/or prevent the damaging effects of metabolic syndrome.  Nutrition   Overview of the Pritikin Eating  Plan  Clinical staff conducted group or individual video education with verbal and written material and guidebook.  Patient learns about the Pritikin Eating Plan for disease risk reduction. The Pritikin Eating Plan emphasizes a wide variety of unrefined, minimally-processed carbohydrates, like fruits, vegetables, whole grains, and legumes. Go, Caution, and Stop food choices are explained. Plant-based and lean animal proteins are emphasized. Rationale provided for low sodium intake for blood pressure control, low added sugars for blood sugar stabilization, and low added fats and oils for coronary artery disease risk reduction and weight management.  Calorie Density  Clinical staff conducted group or individual video education with verbal and written material and guidebook.  Patient learns about calorie density and how it impacts the Pritikin Eating Plan. Knowing the characteristics of the food you choose will help you decide whether those foods will lead to weight gain or weight loss, and whether you want to consume more or less of them. Weight loss is usually a side effect of the Pritikin  Eating Plan because of its focus on low calorie-dense foods.  Label Reading  Clinical staff conducted group or individual video education with verbal and written material and guidebook.  Patient learns about the Pritikin recommended label reading guidelines and corresponding recommendations regarding calorie density, added sugars, sodium content, and whole grains.  Dining Out - Part 1  Clinical staff conducted group or individual video education with verbal and written material and guidebook.  Patient learns that restaurant meals can be sabotaging because they can be so high in calories, fat, sodium, and/or sugar. Patient learns recommended strategies on how to positively address this and avoid unhealthy pitfalls.  Facts on Fats  Clinical staff conducted group or individual video education with verbal and written material and guidebook.  Patient learns that lifestyle modifications can be just as effective, if not more so, as many medications for lowering your risk of heart disease. A Pritikin lifestyle can help to reduce your risk of inflammation and atherosclerosis (cholesterol build-up, or plaque, in the artery walls). Lifestyle interventions such as dietary choices and physical activity address the cause of atherosclerosis. A review of the types of fats and their impact on blood cholesterol levels, along with dietary recommendations to reduce fat intake is also included.  Nutrition Action Plan  Clinical staff conducted group or individual video education with verbal and written material and guidebook.  Patient learns how to incorporate Pritikin recommendations into their lifestyle. Recommendations include planning and keeping personal health goals in mind as an important part of their success.  Healthy Mind-Set    Healthy Minds, Bodies, Hearts  Clinical staff conducted group or individual video education with verbal and written material and guidebook.  Patient learns how to identify when they  are stressed. Video will discuss the impact of that stress, as well as the many benefits of stress management. Patient will also be introduced to stress management techniques. The way we think, act, and feel has an impact on our hearts.  How Our Thoughts Can Heal Our Hearts  Clinical staff conducted group or individual video education with verbal and written material and guidebook.  Patient learns that negative thoughts can cause depression and anxiety. This can result in negative lifestyle behavior and serious health problems. Cognitive behavioral therapy is an effective method to help control our thoughts in order to change and improve our emotional outlook.  Additional Videos:  Exercise    Improving Performance  Clinical staff conducted group or individual video education with verbal and  written material and guidebook.  Patient learns to use a non-linear approach by alternating intensity levels and lengths of time spent exercising to help burn more calories and lose more body fat. Cardiovascular exercise helps improve heart health, metabolism, hormonal balance, blood sugar control, and recovery from fatigue. Resistance training improves strength, endurance, balance, coordination, reaction time, metabolism, and muscle mass. Flexibility exercise improves circulation, posture, and balance. Seek guidance from your physician and exercise physiologist before implementing an exercise routine and learn your capabilities and proper form for all exercise.  Introduction to Yoga  Clinical staff conducted group or individual video education with verbal and written material and guidebook.  Patient learns about yoga, a discipline of the coming together of mind, breath, and body. The benefits of yoga include improved flexibility, improved range of motion, better posture and core strength, increased lung function, weight loss, and positive self-image. Yoga's heart health benefits include lowered blood pressure,  healthier heart rate, decreased cholesterol and triglyceride levels, improved immune function, and reduced stress. Seek guidance from your physician and exercise physiologist before implementing an exercise routine and learn your capabilities and proper form for all exercise.  Medical   Aging: Enhancing Your Quality of Life  Clinical staff conducted group or individual video education with verbal and written material and guidebook.  Patient learns key strategies and recommendations to stay in good physical health and enhance quality of life, such as prevention strategies, having an advocate, securing a Health Care Proxy and Power of Attorney, and keeping a list of medications and system for tracking them. It also discusses how to avoid risk for bone loss.  Biology of Weight Control  Clinical staff conducted group or individual video education with verbal and written material and guidebook.  Patient learns that weight gain occurs because we consume more calories than we burn (eating more, moving less). Even if your body weight is normal, you may have higher ratios of fat compared to muscle mass. Too much body fat puts you at increased risk for cardiovascular disease, heart attack, stroke, type 2 diabetes, and obesity-related cancers. In addition to exercise, following the Pritikin Eating Plan can help reduce your risk.  Decoding Lab Results  Clinical staff conducted group or individual video education with verbal and written material and guidebook.  Patient learns that lab test reflects one measurement whose values change over time and are influenced by many factors, including medication, stress, sleep, exercise, food, hydration, pre-existing medical conditions, and more. It is recommended to use the knowledge from this video to become more involved with your lab results and evaluate your numbers to speak with your doctor.   Diseases of Our Time - Overview  Clinical staff conducted group or  individual video education with verbal and written material and guidebook.  Patient learns that according to the CDC, 50% to 70% of chronic diseases (such as obesity, type 2 diabetes, elevated lipids, hypertension, and heart disease) are avoidable through lifestyle improvements including healthier food choices, listening to satiety cues, and increased physical activity.  Sleep Disorders Clinical staff conducted group or individual video education with verbal and written material and guidebook.  Patient learns how good quality and duration of sleep are important to overall health and well-being. Patient also learns about sleep disorders and how they impact health along with recommendations to address them, including discussing with a physician.  Nutrition  Dining Out - Part 2 Clinical staff conducted group or individual video education with verbal and written material and guidebook.  Patient  learns how to plan ahead and communicate in order to maximize their dining experience in a healthy and nutritious manner. Included are recommended food choices based on the type of restaurant the patient is visiting.   Fueling a Banker conducted group or individual video education with verbal and written material and guidebook.  There is a strong connection between our food choices and our health. Diseases like obesity and type 2 diabetes are very prevalent and are in large-part due to lifestyle choices. The Pritikin Eating Plan provides plenty of food and hunger-curbing satisfaction. It is easy to follow, affordable, and helps reduce health risks.  Menu Workshop  Clinical staff conducted group or individual video education with verbal and written material and guidebook.  Patient learns that restaurant meals can sabotage health goals because they are often packed with calories, fat, sodium, and sugar. Recommendations include strategies to plan ahead and to communicate with the manager,  chef, or server to help order a healthier meal.  Planning Your Eating Strategy  Clinical staff conducted group or individual video education with verbal and written material and guidebook.  Patient learns about the Pritikin Eating Plan and its benefit of reducing the risk of disease. The Pritikin Eating Plan does not focus on calories. Instead, it emphasizes high-quality, nutrient-rich foods. By knowing the characteristics of the foods, we choose, we can determine their calorie density and make informed decisions.  Targeting Your Nutrition Priorities  Clinical staff conducted group or individual video education with verbal and written material and guidebook.  Patient learns that lifestyle habits have a tremendous impact on disease risk and progression. This video provides eating and physical activity recommendations based on your personal health goals, such as reducing LDL cholesterol, losing weight, preventing or controlling type 2 diabetes, and reducing high blood pressure.  Vitamins and Minerals  Clinical staff conducted group or individual video education with verbal and written material and guidebook.  Patient learns different ways to obtain key vitamins and minerals, including through a recommended healthy diet. It is important to discuss all supplements you take with your doctor.   Healthy Mind-Set    Smoking Cessation  Clinical staff conducted group or individual video education with verbal and written material and guidebook.  Patient learns that cigarette smoking and tobacco addiction pose a serious health risk which affects millions of people. Stopping smoking will significantly reduce the risk of heart disease, lung disease, and many forms of cancer. Recommended strategies for quitting are covered, including working with your doctor to develop a successful plan.  Culinary   Becoming a Set designer conducted group or individual video education with verbal and written  material and guidebook.  Patient learns that cooking at home can be healthy, cost-effective, quick, and puts them in control. Keys to cooking healthy recipes will include looking at your recipe, assessing your equipment needs, planning ahead, making it simple, choosing cost-effective seasonal ingredients, and limiting the use of added fats, salts, and sugars.  Cooking - Breakfast and Snacks  Clinical staff conducted group or individual video education with verbal and written material and guidebook.  Patient learns how important breakfast is to satiety and nutrition through the entire day. Recommendations include key foods to eat during breakfast to help stabilize blood sugar levels and to prevent overeating at meals later in the day. Planning ahead is also a key component.  Cooking - Educational psychologist conducted group or individual video education with verbal and  written material and guidebook.  Patient learns eating strategies to improve overall health, including an approach to cook more at home. Recommendations include thinking of animal protein as a side on your plate rather than center stage and focusing instead on lower calorie dense options like vegetables, fruits, whole grains, and plant-based proteins, such as beans. Making sauces in large quantities to freeze for later and leaving the skin on your vegetables are also recommended to maximize your experience.  Cooking - Healthy Salads and Dressing Clinical staff conducted group or individual video education with verbal and written material and guidebook.  Patient learns that vegetables, fruits, whole grains, and legumes are the foundations of the Pritikin Eating Plan. Recommendations include how to incorporate each of these in flavorful and healthy salads, and how to create homemade salad dressings. Proper handling of ingredients is also covered. Cooking - Soups and [employer] - Soups and Desserts Clinical staff conducted  group or individual video education with verbal and written material and guidebook.  Patient learns that Pritikin soups and desserts make for easy, nutritious, and delicious snacks and meal components that are low in sodium, fat, sugar, and calorie density, while high in vitamins, minerals, and filling fiber. Recommendations include simple and healthy ideas for soups and desserts.   Overview     The Pritikin Solution Program Overview Clinical staff conducted group or individual video education with verbal and written material and guidebook.  Patient learns that the results of the Pritikin Program have been documented in more than 100 articles published in peer-reviewed journals, and the benefits include reducing risk factors for (and, in some cases, even reversing) high cholesterol, high blood pressure, type 2 diabetes, obesity, and more! An overview of the three key pillars of the Pritikin Program will be covered: eating well, doing regular exercise, and having a healthy mind-set.  WORKSHOPS  Exercise: Exercise Basics: Building Your Action Plan Clinical staff led group instruction and group discussion with PowerPoint presentation and patient guidebook. To enhance the learning environment the use of posters, models and videos may be added. At the conclusion of this workshop, patients will comprehend the difference between physical activity and exercise, as well as the benefits of incorporating both, into their routine. Patients will understand the FITT (Frequency, Intensity, Time, and Type) principle and how to use it to build an exercise action plan. In addition, safety concerns and other considerations for exercise and cardiac rehab will be addressed by the presenter. The purpose of this lesson is to promote a comprehensive and effective weekly exercise routine in order to improve patients' overall level of fitness.   Managing Heart Disease: Your Path to a Healthier Heart Clinical staff led  group instruction and group discussion with PowerPoint presentation and patient guidebook. To enhance the learning environment the use of posters, models and videos may be added.At the conclusion of this workshop, patients will understand the anatomy and physiology of the heart. Additionally, they will understand how Pritikin's three pillars impact the risk factors, the progression, and the management of heart disease.  The purpose of this lesson is to provide a high-level overview of the heart, heart disease, and how the Pritikin lifestyle positively impacts risk factors.  Exercise Biomechanics Clinical staff led group instruction and group discussion with PowerPoint presentation and patient guidebook. To enhance the learning environment the use of posters, models and videos may be added. Patients will learn how the structural parts of their bodies function and how these functions impact their daily activities,  movement, and exercise. Patients will learn how to promote a neutral spine, learn how to manage pain, and identify ways to improve their physical movement in order to promote healthy living. The purpose of this lesson is to expose patients to common physical limitations that impact physical activity. Participants will learn practical ways to adapt and manage aches and pains, and to minimize their effect on regular exercise. Patients will learn how to maintain good posture while sitting, walking, and lifting.  Balance Training and Fall Prevention  Clinical staff led group instruction and group discussion with PowerPoint presentation and patient guidebook. To enhance the learning environment the use of posters, models and videos may be added. At the conclusion of this workshop, patients will understand the importance of their sensorimotor skills (vision, proprioception, and the vestibular system) in maintaining their ability to balance as they age. Patients will apply a variety of balancing  exercises that are appropriate for their current level of function. Patients will understand the common causes for poor balance, possible solutions to these problems, and ways to modify their physical environment in order to minimize their fall risk. The purpose of this lesson is to teach patients about the importance of maintaining balance as they age and ways to minimize their risk of falling.  WORKSHOPS   Nutrition:  Fueling a Ship broker led group instruction and group discussion with PowerPoint presentation and patient guidebook. To enhance the learning environment the use of posters, models and videos may be added. Patients will review the foundational principles of the Pritikin Eating Plan and understand what constitutes a serving size in each of the food groups. Patients will also learn Pritikin-friendly foods that are better choices when away from home and review make-ahead meal and snack options. Calorie density will be reviewed and applied to three nutrition priorities: weight maintenance, weight loss, and weight gain. The purpose of this lesson is to reinforce (in a group setting) the key concepts around what patients are recommended to eat and how to apply these guidelines when away from home by planning and selecting Pritikin-friendly options. Patients will understand how calorie density may be adjusted for different weight management goals.  Mindful Eating  Clinical staff led group instruction and group discussion with PowerPoint presentation and patient guidebook. To enhance the learning environment the use of posters, models and videos may be added. Patients will briefly review the concepts of the Pritikin Eating Plan and the importance of low-calorie dense foods. The concept of mindful eating will be introduced as well as the importance of paying attention to internal hunger signals. Triggers for non-hunger eating and techniques for dealing with triggers will be explored.  The purpose of this lesson is to provide patients with the opportunity to review the basic principles of the Pritikin Eating Plan, discuss the value of eating mindfully and how to measure internal cues of hunger and fullness using the Hunger Scale. Patients will also discuss reasons for non-hunger eating and learn strategies to use for controlling emotional eating.  Targeting Your Nutrition Priorities Clinical staff led group instruction and group discussion with PowerPoint presentation and patient guidebook. To enhance the learning environment the use of posters, models and videos may be added. Patients will learn how to determine their genetic susceptibility to disease by reviewing their family history. Patients will gain insight into the importance of diet as part of an overall healthy lifestyle in mitigating the impact of genetics and other environmental insults. The purpose of this lesson is to  provide patients with the opportunity to assess their personal nutrition priorities by looking at their family history, their own health history and current risk factors. Patients will also be able to discuss ways of prioritizing and modifying the Pritikin Eating Plan for their highest risk areas  Menu  Clinical staff led group instruction and group discussion with PowerPoint presentation and patient guidebook. To enhance the learning environment the use of posters, models and videos may be added. Using menus brought in from [employer], or printed from [employer], patients will apply the Pritikin dining out guidelines that were presented in the [employer] video. Patients will also be able to practice these guidelines in a variety of provided scenarios. The purpose of this lesson is to provide patients with the opportunity to practice hands-on learning of the Pritikin Dining Out guidelines with actual menus and practice scenarios.  Label Reading Clinical staff led group instruction  and group discussion with PowerPoint presentation and patient guidebook. To enhance the learning environment the use of posters, models and videos may be added. Patients will review and discuss the Pritikin label reading guidelines presented in Pritikin's Label Reading Educational series video. Using fool labels brought in from local grocery stores and markets, patients will apply the label reading guidelines and determine if the packaged food meet the Pritikin guidelines. The purpose of this lesson is to provide patients with the opportunity to review, discuss, and practice hands-on learning of the Pritikin Label Reading guidelines with actual packaged food labels. Cooking School  Pritikin's [employer] are designed to teach patients ways to prepare quick, simple, and affordable recipes at home. The importance of nutrition's role in chronic disease risk reduction is reflected in its emphasis in the overall Pritikin program. By learning how to prepare essential core Pritikin Eating Plan recipes, patients will increase control over what they eat; be able to customize the flavor of foods without the use of added salt, sugar, or fat; and improve the quality of the food they consume. By learning a set of core recipes which are easily assembled, quickly prepared, and affordable, patients are more likely to prepare more healthy foods at home. These workshops focus on convenient breakfasts, simple entres, side dishes, and desserts which can be prepared with minimal effort and are consistent with nutrition recommendations for cardiovascular risk reduction. Cooking [employer] are taught by a Armed forces logistics/support/administrative officer (RD) who has been trained by the [employer]. The chef or RD has a clear understanding of the importance of minimizing - if not completely eliminating - added fat, sugar, and sodium in recipes. Throughout the series of Cooking School Workshop sessions, patients will learn  about healthy ingredients and efficient methods of cooking to build confidence in their capability to prepare    Cooking School weekly topics:  Adding Flavor- Sodium-Free  Fast and Healthy Breakfasts  Powerhouse Plant-Based Proteins  Satisfying Salads and Dressings  Simple Sides and Sauces  International Cuisine-Spotlight on the [employer] Zones  Delicious Desserts  Savory Soups  [employer] - Meals in a Astronomer Appetizers and Snacks  Comforting Weekend Breakfasts  One-Pot Wonders   Fast Evening Meals  Landscape architect Your Pritikin Plate  WORKSHOPS   Healthy Mindset (Psychosocial):  Focused Goals, Sustainable Changes Clinical staff led group instruction and group discussion with PowerPoint presentation and patient guidebook. To enhance the learning environment the use of posters, models and videos may be added. Patients will be able to  apply effective goal setting strategies to establish at least one personal goal, and then take consistent, meaningful action toward that goal. They will learn to identify common barriers to achieving personal goals and develop strategies to overcome them. Patients will also gain an understanding of how our mind-set can impact our ability to achieve goals and the importance of cultivating a positive and growth-oriented mind-set. The purpose of this lesson is to provide patients with a deeper understanding of how to set and achieve personal goals, as well as the tools and strategies needed to overcome common obstacles which may arise along the way.  From Head to Heart: The Power of a Healthy Outlook  Clinical staff led group instruction and group discussion with PowerPoint presentation and patient guidebook. To enhance the learning environment the use of posters, models and videos may be added. Patients will be able to recognize and describe the impact of emotions and mood on physical health. They will discover the importance of  self-care and explore self-care practices which may work for them. Patients will also learn how to utilize the 4 C's to cultivate a healthier outlook and better manage stress and challenges. The purpose of this lesson is to demonstrate to patients how a healthy outlook is an essential part of maintaining good health, especially as they continue their cardiac rehab journey.  Healthy Sleep for a Healthy Heart Clinical staff led group instruction and group discussion with PowerPoint presentation and patient guidebook. To enhance the learning environment the use of posters, models and videos may be added. At the conclusion of this workshop, patients will be able to demonstrate knowledge of the importance of sleep to overall health, well-being, and quality of life. They will understand the symptoms of, and treatments for, common sleep disorders. Patients will also be able to identify daytime and nighttime behaviors which impact sleep, and they will be able to apply these tools to help manage sleep-related challenges. The purpose of this lesson is to provide patients with a general overview of sleep and outline the importance of quality sleep. Patients will learn about a few of the most common sleep disorders. Patients will also be introduced to the concept of "sleep hygiene," and discover ways to self-manage certain sleeping problems through simple daily behavior changes. Finally, the workshop will motivate patients by clarifying the links between quality sleep and their goals of heart-healthy living.   Recognizing and Reducing Stress Clinical staff led group instruction and group discussion with PowerPoint presentation and patient guidebook. To enhance the learning environment the use of posters, models and videos may be added. At the conclusion of this workshop, patients will be able to understand the types of stress reactions, differentiate between acute and chronic stress, and recognize the impact that chronic  stress has on their health. They will also be able to apply different coping mechanisms, such as reframing negative self-talk. Patients will have the opportunity to practice a variety of stress management techniques, such as deep abdominal breathing, progressive muscle relaxation, and/or guided imagery.  The purpose of this lesson is to educate patients on the role of stress in their lives and to provide healthy techniques for coping with it.  Learning Barriers/Preferences:  Learning Barriers/Preferences - 10/22/23 1112       Learning Barriers/Preferences   Learning Barriers Sight;Hearing    Learning Preferences Computer/Internet;Group Instruction;Individual Instruction;Skilled Demonstration;Pictoral;Written Material             Education Topics:  Knowledge Questionnaire Score:  Knowledge Questionnaire Score -  10/22/23 1113       Knowledge Questionnaire Score   Pre Score 19/24             Core Components/Risk Factors/Patient Goals at Admission:  Personal Goals and Risk Factors at Admission - 10/22/23 1114       Core Components/Risk Factors/Patient Goals on Admission    Weight Management Yes;Weight Loss    Intervention Weight Management: Develop a combined nutrition and exercise program designed to reach desired caloric intake, while maintaining appropriate intake of nutrient and fiber, sodium and fats, and appropriate energy expenditure required for the weight goal.;Weight Management: Provide education and appropriate resources to help participant work on and attain dietary goals.    Expected Outcomes Short Term: Continue to assess and modify interventions until short term weight is achieved;Long Term: Adherence to nutrition and physical activity/exercise program aimed toward attainment of established weight goal;Weight Loss: Understanding of general recommendations for a balanced deficit meal plan, which promotes 1-2 lb weight loss per week and includes a negative energy  balance of [phone] kcal/d;Understanding of distribution of calorie intake throughout the day with the consumption of 4-5 meals/snacks;Understanding recommendations for meals to include 15-35% energy as protein, 25-35% energy from fat, 35-60% energy from carbohydrates, less than 200mg  of dietary cholesterol, 20-35 gm of total fiber daily    Diabetes Yes    Intervention Provide education about signs/symptoms and action to take for hypo/hyperglycemia.;Provide education about proper nutrition, including hydration, and aerobic/resistive exercise prescription along with prescribed medications to achieve blood glucose in normal ranges: Fasting glucose 65-99 mg/dL    Expected Outcomes Short Term: Participant verbalizes understanding of the signs/symptoms and immediate care of hyper/hypoglycemia, proper foot care and importance of medication, aerobic/resistive exercise and nutrition plan for blood glucose control.;Long Term: Attainment of HbA1C < 7%.    Hypertension Yes    Intervention Provide education on lifestyle modifcations including regular physical activity/exercise, weight management, moderate sodium restriction and increased consumption of fresh fruit, vegetables, and low fat dairy, alcohol moderation, and smoking cessation.;Monitor prescription use compliance.    Expected Outcomes Short Term: Continued assessment and intervention until BP is < 140/17mm HG in hypertensive participants. < 130/33mm HG in hypertensive participants with diabetes, heart failure or chronic kidney disease.;Long Term: Maintenance of blood pressure at goal levels.    Lipids Yes    Intervention Provide education and support for participant on nutrition & aerobic/resistive exercise along with prescribed medications to achieve LDL 70mg , HDL >40mg .    Expected Outcomes Short Term: Participant states understanding of desired cholesterol values and is compliant with medications prescribed. Participant is following exercise prescription  and nutrition guidelines.;Long Term: Cholesterol controlled with medications as prescribed, with individualized exercise RX and with personalized nutrition plan. Value goals: LDL < 70mg , HDL > 40 mg.             Core Components/Risk Factors/Patient Goals Review:   Goals and Risk Factor Review     Row Name 10/24/23 1701 11/19/23 1635           Core Components/Risk Factors/Patient Goals Review   Personal Goals Review Weight Management/Obesity;Hypertension;Lipids;Diabetes Weight Management/Obesity;Hypertension;Lipids;Diabetes      Review Caedyn started cardiac rehab on 10/24/23. Johm did well with exercise. Vital signs were stable. Sekou has been doing well with exercise. Vital signs have been  stable Since blood pressure medications have been adjusted. Kayan has lost 1 kg since starting cardiac rehab.      Expected Outcomes Acxel will continue to participate in cardiac rehab for  exercise, nutrition and lifestyle modifications Malvin will continue to participate in cardiac rehab for exercise, nutrition and lifestyle modifications               Core Components/Risk Factors/Patient Goals at Discharge (Final Review):   Goals and Risk Factor Review - 11/19/23 1635       Core Components/Risk Factors/Patient Goals Review   Personal Goals Review Weight Management/Obesity;Hypertension;Lipids;Diabetes    Review Anuj has been doing well with exercise. Vital signs have been  stable Since blood pressure medications have been adjusted. Carol has lost 1 kg since starting cardiac rehab.    Expected Outcomes Romuald will continue to participate in cardiac rehab for exercise, nutrition and lifestyle modifications             ITP Comments:  ITP Comments     Row Name 10/22/23 1039 10/24/23 1700 11/19/23 1632       ITP Comments Dr. Armanda Magic medical director. Introduction to pritikin education/ intensive cardiac rehab. Initial orientation packet reviewed with patient. 30 Day ITP Review.  Yoshimi started cardiac rehab on 10/24/23 and did well with exercise. 30 Day ITP Review. Parthiv  has good attendance and participation in cardiac rehab              Comments: See ITP comments.Thayer Headings RN BSN

## 2023-11-21 ENCOUNTER — Encounter (HOSPITAL_COMMUNITY)
Admission: RE | Admit: 2023-11-21 | Discharge: 2023-11-21 | Disposition: A | Discharge status: Home / Self Care | Payer: Medicare Other | Source: Ambulatory Visit | Attending: Cardiovascular Disease | Admitting: Cardiovascular Disease

## 2023-11-21 DIAGNOSIS — Z955 Presence of coronary angioplasty implant and graft: Secondary | ICD-10-CM | POA: Diagnosis not present

## 2023-11-24 ENCOUNTER — Encounter (HOSPITAL_COMMUNITY)
Admission: RE | Admit: 2023-11-24 | Discharge: 2023-11-24 | Disposition: A | Discharge status: Home / Self Care | Payer: Medicare Other | Source: Ambulatory Visit | Attending: Cardiovascular Disease | Admitting: Cardiovascular Disease

## 2023-11-24 DIAGNOSIS — Z955 Presence of coronary angioplasty implant and graft: Secondary | ICD-10-CM | POA: Diagnosis not present

## 2023-11-25 LAB — LIPID PANEL
Chol/HDL Ratio: 4.2 {ratio} (ref 0.0–5.0)
Cholesterol, Total: 135 mg/dL (ref 100–199)
HDL: 32 mg/dL — ABNORMAL LOW (ref >39)
LDL Chol Calc (NIH): 60 mg/dL (ref 0–99)
Triglycerides: 272 mg/dL — ABNORMAL HIGH (ref 0–149)
VLDL Cholesterol Cal: 43 mg/dL — ABNORMAL HIGH (ref 5–40)

## 2023-11-25 LAB — HEPATIC FUNCTION PANEL
ALT: 37 [IU]/L (ref 0–44)
AST: 28 [IU]/L (ref 0–40)
Albumin: 4.5 g/dL (ref 3.7–4.7)
Alkaline Phosphatase: 49 [IU]/L (ref 44–121)
Bilirubin Total: 0.4 mg/dL (ref 0.0–1.2)
Bilirubin, Direct: 0.13 mg/dL (ref 0.00–0.40)
Total Protein: 6.8 g/dL (ref 6.0–8.5)

## 2023-11-26 ENCOUNTER — Other Ambulatory Visit: Payer: Self-pay | Admitting: Physician Assistant

## 2023-11-26 ENCOUNTER — Encounter (HOSPITAL_COMMUNITY)
Admission: RE | Admit: 2023-11-26 | Discharge: 2023-11-26 | Disposition: A | Discharge status: Home / Self Care | Payer: Medicare Other | Source: Ambulatory Visit | Attending: Cardiovascular Disease | Admitting: Cardiovascular Disease

## 2023-11-26 DIAGNOSIS — Z955 Presence of coronary angioplasty implant and graft: Secondary | ICD-10-CM

## 2023-11-27 ENCOUNTER — Ambulatory Visit: Payer: Medicare Other | Attending: Physician Assistant | Admitting: Physician Assistant

## 2023-11-27 ENCOUNTER — Encounter: Payer: Self-pay | Admitting: Physician Assistant

## 2023-11-27 VITALS — BP 112/50 | HR 81 | Ht 67.0 in | Wt 183.0 lb

## 2023-11-27 DIAGNOSIS — E785 Hyperlipidemia, unspecified: Secondary | ICD-10-CM | POA: Diagnosis not present

## 2023-11-27 DIAGNOSIS — I959 Hypotension, unspecified: Secondary | ICD-10-CM | POA: Diagnosis not present

## 2023-11-27 DIAGNOSIS — I25810 Atherosclerosis of coronary artery bypass graft(s) without angina pectoris: Secondary | ICD-10-CM | POA: Diagnosis not present

## 2023-11-27 MED ORDER — OMEGA-3-ACID ETHYL ESTERS 1 G PO CAPS: 1.0000 g | ORAL_CAPSULE | Freq: Two times a day (BID) | ORAL | 11 refills | Status: DC

## 2023-11-27 NOTE — Progress Notes (Signed)
Cardiology Office Note:  .   Date:  11/27/2023  ID:  Fred [name], DOB [birth year], MRN [id number] PCP: Noberto Retort, MD  [facility] Providers Cardiologist:  Nanetta Batty, MD     History of Present Illness: .   Fred [name] is a 82 y.o. male with PMH of dizziness, CAD, hyperlipidemia and DM II.  He has family history of heart disease with mother who had a CABG and a younger brother who died.  Previous heart monitor obtained on 10/12/2019 showed sinus rhythm, occasional PAC and PVCs, occasional run of PSVT.  Coronary CTA obtained in October 2020 demonstrated three-vessel CAD.  Echocardiogram obtained on 10/06/2019 showed EF 55 to 60%, grade 1 DD, no significant valve issue.  Subsequent cardiac catheterization performed on 10/11/2019 confirmed the left main and three-vessel disease.  He underwent 4 vessel CABG by Dr. [name] with LIMA to LAD, SVG to ramus, OM and PDA.     I saw the patient on 08/22/2023 at which time he described vague chest discomfort.  EKG however showed T wave inversion in the septal lead and new Q waves in the inferior lead.  We proceeded with a PET stress test and echocardiogram.  Echocardiogram obtained on 08/26/2023 showed EF 55 to 60%, no regional wall motion abnormality, grade 1 DD, mild to moderate TR, mild to moderate AI.  PET stress test obtained on 09/09/2023 showed a large defect with moderate reduction in uptake present in the mid to basal anterior lateral location which was reversible, consistent with ischemia, EF 44%. Cardiac catheterization performed on 09/18/2023 revealed 80% distal left main lesion, 90% ostial to proximal LAD lesion, 50% ramus lesion, 95% proximal left circumflex lesion, 70% proximal RCA lesion, patent LIMA to LAD, patent SVG to PDA with diffuse 60% disease in proximal PDA after vein graft insertion, ostially occluded SVG to ramus, 95% proximal SVG to OM lesion treated with 4.0 x 12 mm Synergy DES.    We received a message in early  September that patient had hypotensive episode at cardiac rehab.  Blood pressure was down to 88/58.  I recommended discontinuing metoprolol, hydrate, basic metabolic panel and CBC, and 1 week follow-up.  Patient presents today for follow-up.  Since discontinuation of beta-blocker, blood pressure has been mostly in the 110s.  Lowest blood pressure based on his diary was 99, all the other blood pressures ranges from 100-120s.  He denies any dizziness, blurry vision or feeling of passing out.  He has no chest pain or shortness of breath.  Overall, he has been doing well.  I explained the recent blood work result and the recommendation to add Lovaza 1000 mg twice a day dosing for severely elevated triglyceride.  I also mentioned although his LDL is 60, ideally, given the amount of blockage and history of bypass surgery, his LDL goal should be less than 55.  He is very eager to control his risk factor and is quite concerned about how much blockages he has.  I recommended fasting lipid panel in 3 months.  Since he is feeling fine, I am okay with delaying CBC and basic metabolic panel until when he come back for the fasting lipid panel.  He can follow-up with Dr. Gery Pray as previously scheduled in January.   ROS:   He denies chest pain, palpitations, dyspnea, pnd, orthopnea, n, v, dizziness, syncope, edema, weight gain, or early satiety. All other systems reviewed and are otherwise negative except as noted above.    Studies  Reviewed: .        Cardiac Studies & Procedures   CARDIAC CATHETERIZATION  CARDIAC CATHETERIZATION 09/18/2023  Narrative Images from the original result were not included. Coronary and bypass graft angiography, SVG-OM intervention 09/18/2023: LM: Distal 80% stenosis LAD: Ostial proximal 90% stenosis, mid occlusion Ramus: Small caliber vessel, proximal 50% disease LCx: Proximal 95-90% tandem stenoses RCA: 70% proximal ISR, followed by occlusion  LIMA-LAD: Patent Mid to distal LAD  has mild diffuse disease after LIMA-LAD insertion SVG-PDA: Patent, 20% proximal disease Proximal PDA has diffuse 60% disease after SVG-PDA insertion  SVG-OM: Patent,  20% proximal disease, followed by 95% proximal stenosis OM has 50% stenosis after SVG-OM insertion SVG-ramus: Ostially occluded  Successful percutaneous coronary intervention SVG-OM Direct stenting with 4.0 X 12 mm Synergy drug-eluting stent using Spider Fx 5.0 embolic protection Post dilatation using 4.0 x 8 mm [place]ont balloon at 14 atm    Fred Emiliano Dyer, MD  Findings Coronary Findings Diagnostic  Dominance: Right  Left Main Mid LM to Dist LM lesion is 80% stenosed. The lesion is moderately calcified.  Left Anterior Descending Ost LAD to Prox LAD lesion is 90% stenosed. The lesion is calcified.  Left Circumflex Ost Cx to Prox Cx lesion is 95% stenosed. Prox Cx lesion is 90% stenosed.  First Obtuse Marginal Branch 1st Mrg lesion is 50% stenosed.  Right Coronary Artery Ost RCA to Prox RCA lesion is 70% stenosed. The lesion was previously treated over 2 years ago. Prox RCA lesion is 100% stenosed.  Right Posterior Descending Artery RPDA lesion is 60% stenosed.  LIMA Graft To Mid LAD And is normal in caliber.  The graft exhibits no disease.  Graft To RPDA And is normal in caliber.  The graft exhibits no disease. Prox Graft lesion is 20% stenosed.  Graft To 1st Mrg And is normal in caliber. Prox Graft-1 lesion is 20% stenosed. Prox Graft-2 lesion is 95% stenosed.  Saphenous Graft To Ramus SVG. Prox Graft lesion is 100% stenosed.  Intervention  Prox Graft-2 lesion (Graft To 1st Mrg) Stent Pre-stent angioplasty was not performed. A drug-eluting stent was successfully placed. Post-stent angioplasty was performed. Post-Intervention Lesion Assessment The intervention was successful. Embolic protection device was deployed. Pre-interventional TIMI flow is 2. Post-intervention TIMI flow is 3. No  complications occurred at this lesion. There is a 0% residual stenosis post intervention.   CARDIAC CATHETERIZATION  CARDIAC CATHETERIZATION 10/11/2019  Narrative Images from the original result were not included.   Mid LM to Dist LM lesion is 80% stenosed.  Ost LAD to Prox LAD lesion is 90% stenosed.  Ost Cx to Prox Cx lesion is 90% stenosed.  Prox RCA to Mid RCA lesion is 90% stenosed.  Fred [name] is a 82 y.o. male   [id number] LOCATION:  FACILITY: [facility] PHYSICIAN: Nanetta Batty, M.D. 06/06/1941   DATE OF PROCEDURE:  10/11/2019  DATE OF DISCHARGE:     CARDIAC CATHETERIZATION    History obtained from chart review.Fred [name] is a 82 y.o.  mildly overweight weight married Caucasian male father of 2, grandfather of 4 grandchildren who is referred by the ER and Dr. Tiburcio Pea for evaluation of dizziness.  He retired in 2000 from working at [employer] in [place].  I last saw him in the office 09/07/2019. His cardiac risk factors include treated diabetes and hyperlipidemia.  He does not smoke.  He does have a family history of heart disease with a mother that had CABG and a younger brother  who died as well which was found in the evaluation of syncope.  He is never had a heart attack or stroke.  He denies chest pain or shortness of breath.  He is fairly active and routinely walked 3 miles a day up until COVID-19 when he became more sedentary however recently he is try to increase his activity.  On 08/30/2019 he went out for his morning run of 3 miles and at the end he felt weak.  When driving back home he thought that he may have passed out but he was dizzy.  He went to the ER where he was nauseated as well.  ER evaluation was unrevealing.  He said he felt a little bit of chest discomfort this morning when coming to the office but otherwise specifically denied the symptoms.  I performed event monitoring which is still processing.  A coronary CTA did  show three-vessel disease.  Based on this, I recommended that we proceed with outpatient diagnostic coronary angiography.  Impression Mr. Buggy has left main/three-vessel disease with preserved LV function by 2D echo.  He has surgical anatomy.  Given the severe nature of his blockage I favor keeping him for CABG.  T CTS has been consulted.  The sheath was removed and a TR band was placed on the right wrist to achieve patent hemostasis.  The patient left the lab in stable condition.  Nanetta Batty. MD, [facility] 10/11/2019 11:53 AM  Findings Coronary Findings Diagnostic  Dominance: Right  Left Main Mid LM to Dist LM lesion is 80% stenosed. The lesion is moderately calcified.  Left Anterior Descending Ost LAD to Prox LAD lesion is 90% stenosed. The lesion is calcified.  Left Circumflex Ost Cx to Prox Cx lesion is 90% stenosed.  Right Coronary Artery Prox RCA to Mid RCA lesion is 90% stenosed. The lesion is calcified.  Intervention  No interventions have been documented.   STRESS TESTS  NM PET CT CARDIAC PERFUSION MULTI W/ABSOLUTE BLOODFLOW 09/09/2023  Narrative   LV perfusion is abnormal. Defect 1: There is a large defect with moderate reduction in uptake present in the mid to basal anterolateral location(s) that is reversible. There is normal wall motion in the defect area. Consistent with ischemia.   Rest left ventricular function is abnormal. Rest global function is mildly reduced. There were no regional wall motion abnormalities. Rest EF: 44%. Stress left ventricular function is abnormal. Stress global function is mildly reduced. There were no regional wall abnormalities. Stress EF: 50%. End diastolic cavity size is mildly enlarged. End systolic cavity size is mildly enlarged.   Myocardial blood flow reserve is not reported in this patient due to technical or patient-specific concerns that affect accuracy. Global MBFR abnormal 1.73 but may not be accurate in setting of prior CABG    Coronary calcium assessment not performed due to prior revascularization. Not commented on in setting of prior CABG   Findings are consistent with ischemia. The study is intermediate risk.  EXAM: OVER-READ INTERPRETATION  CT CHEST  The following report is a limited chest CT over-read performed by radiologist Dr. Irma Newness [facility] on 09/09/2023. This over-read does not include interpretation of cardiac or coronary anatomy or pathology nor does it include evaluation of the PET data. The cardiac PET-CT interpretation by the cardiologist is attached.  COMPARISON:  Chest CT 04/12/2021  FINDINGS: Mediastinum/Nodes: No enlarged lymph nodes within the visualized mediastinum.Aortic atherosclerosis noted post median sternotomy and CABG.  Lungs/Pleura: There is no pleural effusion. Mild central  airway thickening. The lungs are otherwise clear without confluent airspace disease or suspicious nodularity.  Upper abdomen: No significant findings in the visualized upper abdomen.  Musculoskeletal/Chest wall: Chronic nonunion of the median sternotomy. The sternotomy wires are intact. No acute osseous findings.  IMPRESSION: 1. No acute extracardiac findings. 2. Chronic nonunion of the median sternotomy. 3.  Aortic Atherosclerosis (ICD10-I70.0).   Electronically Signed By: Carey Bullocks M.D. On: 09/09/2023 11:17  ECHOCARDIOGRAM  ECHOCARDIOGRAM COMPLETE 08/26/2023  Narrative ECHOCARDIOGRAM REPORT    Patient Name:   Fred [name] Date of Exam: 08/26/2023 Medical Rec #:  [id number]       Height:       67.0 in Accession #:    [id number]      Weight:       186.6 lb Date of Birth:  27-Sep-1941       BSA:          1.964 m Patient Age:    82 years        BP:           102/52 mmHg Patient Gender: M               HR:           69 bpm. Exam Location:  [address]  Procedure: 3D Echo, 2D Echo, Cardiac Doppler and Color Doppler  Indications:    Chest Discomfort  R07.89  History:        Patient has prior history of Echocardiogram examinations, most recent 10/06/2019. CAD, Prior CABG; Risk Factors:Diabetes.  Sonographer:    Thurman Coyer RDCS Referring Phys: [id number] Kenneth Lax  IMPRESSIONS   1. Left ventricular ejection fraction, by estimation, is 55 to 60%. Left ventricular ejection fraction by 3D volume is 57 %. The left ventricle has normal function. The left ventricle has no regional wall motion abnormalities. Left ventricular diastolic parameters are consistent with Grade I diastolic dysfunction (impaired relaxation). 2. Right ventricular systolic function is normal. The right ventricular size is mildly enlarged. There is normal pulmonary artery systolic pressure. 3. The mitral valve is normal in structure. No evidence of mitral valve regurgitation. No evidence of mitral stenosis. 4. Tricuspid valve regurgitation is mild to moderate. 5. The aortic valve is normal in structure. Aortic valve regurgitation is mild to moderate. Aortic valve sclerosis/calcification is present, without any evidence of aortic stenosis. Aortic regurgitation PHT measures 387 msec. 6. The inferior vena cava is normal in size with greater than 50% respiratory variability, suggesting right atrial pressure of 3 mmHg.  FINDINGS Left Ventricle: Left ventricular ejection fraction, by estimation, is 55 to 60%. Left ventricular ejection fraction by 3D volume is 57 %. The left ventricle has normal function. The left ventricle has no regional wall motion abnormalities. The left ventricular internal cavity size was normal in size. There is no left ventricular hypertrophy. Left ventricular diastolic parameters are consistent with Grade I diastolic dysfunction (impaired relaxation).  Right Ventricle: The right ventricular size is mildly enlarged. No increase in right ventricular wall thickness. Right ventricular systolic function is normal. There is normal pulmonary artery systolic  pressure. The tricuspid regurgitant velocity is 2.18 m/s, and with an assumed right atrial pressure of 3 mmHg, the estimated right ventricular systolic pressure is 22.0 mmHg.  Left Atrium: Left atrial size was normal in size.  Right Atrium: Right atrial size was normal in size.  Pericardium: There is no evidence of pericardial effusion.  Mitral Valve: The mitral valve is normal in structure. No  evidence of mitral valve regurgitation. No evidence of mitral valve stenosis.  Tricuspid Valve: The tricuspid valve is normal in structure. Tricuspid valve regurgitation is mild to moderate. No evidence of tricuspid stenosis.  Aortic Valve: The aortic valve is normal in structure. Aortic valve regurgitation is mild to moderate. Aortic regurgitation PHT measures 387 msec. Aortic valve sclerosis/calcification is present, without any evidence of aortic stenosis.  Pulmonic Valve: The pulmonic valve was normal in structure. Pulmonic valve regurgitation is not visualized. No evidence of pulmonic stenosis.  Aorta: The aortic root is normal in size and structure.  Venous: The inferior vena cava is normal in size with greater than 50% respiratory variability, suggesting right atrial pressure of 3 mmHg.  IAS/Shunts: No atrial level shunt detected by color flow Doppler.   LEFT VENTRICLE PLAX 2D LVIDd:         4.70 cm         Diastology LVIDs:         3.10 cm         LV e' medial:    4.98 cm/s LV PW:         0.90 cm         LV E/e' medial:  9.4 LV IVS:        1.00 cm         LV e' lateral:   7.23 cm/s LVOT diam:     2.20 cm         LV E/e' lateral: 6.5 LV SV:         70 LV SV Index:   35 LVOT Area:     3.80 cm        3D Volume EF LV 3D EF:    Left ventricul ar ejection fraction by 3D volume is 57 %.  3D Volume EF: 3D EF:        57 % LV EDV:       133 ml LV ESV:       58 ml LV SV:        75 ml  RIGHT VENTRICLE RV Basal diam:  3.90 cm RV Mid diam:    4.10 cm RV S prime:     9.02  cm/s TAPSE (M-mode): 1.3 cm  LEFT ATRIUM           Index        RIGHT ATRIUM           Index LA diam:      3.60 cm 1.83 cm/m   RA Area:     16.40 cm LA Vol (A2C): 63.0 ml 32.09 ml/m  RA Volume:   45.70 ml  23.27 ml/m LA Vol (A4C): 43.3 ml 22.05 ml/m AORTIC VALVE LVOT Vmax:   85.40 cm/s LVOT Vmean:  56.200 cm/s LVOT VTI:    0.183 m AI PHT:      387 msec  AORTA Ao Root diam: 3.50 cm Ao Asc diam:  3.40 cm  MITRAL VALVE               TRICUSPID VALVE MV Area (PHT): 2.87 cm    TR Peak grad:   19.0 mmHg MV Decel Time: 264 msec    TR Vmax:        218.00 cm/s MV E velocity: 46.80 cm/s MV A velocity: 84.20 cm/s  SHUNTS MV E/A ratio:  0.56        Systemic VTI:  0.18 m Systemic Diam: 2.20 cm  Lavona Mound Tobb DO Electronically signed by  Kardie Tobb DO Signature Date/Time: 08/26/2023/11:09:55 AM    Final  TEE  ECHO INTRAOPERATIVE TEE 10/12/2019  Narrative *INTRAOPERATIVE TRANSESOPHAGEAL REPORT *    Patient Name:   Fred [name] Date of Exam: 10/12/2019 Medical Rec #:  [id number]       Height:       67.0 in Accession #:    [id number]      Weight:       197.2 lb Date of Birth:  11-Aug-1941       BSA:          2.01 m Patient Age:    78 years        BP:           130/70 mmHg Patient Gender: M               HR:           65 bpm. Exam Location:  Anesthesiology  Transesophogeal exam was perform intraoperatively during surgical procedure. Patient was closely monitored under general anesthesia during the entirety of examination.  Indications:     I25.110 Atherosclerotic heart disease of native coronary artery with unstable angina pectoris History:         CAD Signs/Symptoms:Chest Pain Risk Factors:Diabetes. Medications:Beta Blockers. Performing Phys: [name] Diagnosing Phys: Leslye Peer MD  Complications: No known complications during this procedure. POST-OP IMPRESSIONS - Left Ventricle: The left ventricle is unchanged from pre-bypass. - Right Ventricle: The right  ventricle appears unchanged from pre-bypass. - Aorta: The aorta appears unchanged from pre-bypass. - Left Atrium: The left atrium appears unchanged from pre-bypass. - Left Atrial Appendage: The left atrial appendage appears unchanged from pre-bypass. - Aortic Valve: The aortic valve appears unchanged from pre-bypass. - Mitral Valve: The mitral valve appears unchanged from pre-bypass. - Tricuspid Valve: The tricuspid valve appears unchanged from pre-bypass. - Interatrial Septum: The interatrial septum appears unchanged from pre-bypass. - Interventricular Septum: The interventricular septum appears unchanged from pre-bypass. - Pericardium: The pericardium appears unchanged from pre-bypass.  PRE-OP FINDINGS Left Ventricle: The left ventricle has normal systolic function, with an ejection fraction of 55-60%. The cavity size was normal. There is no increase in left ventricular wall thickness.  Right Ventricle: The right ventricle has normal systolic function. The cavity was normal. There is no increase in right ventricular wall thickness.  Left Atrium: Left atrial size was normal in size. The left atrial appendage is well visualized and there is no evidence of thrombus present.  Right Atrium: Right atrial size was normal in size. Right atrial pressure is estimated at 10 mmHg.  Interatrial Septum: No atrial level shunt detected by color flow Doppler. Increased thickness of the atrial septum sparing the fossa ovalis consistent with The interatrial septum 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  Pericardium: There is no evidence of pericardial effusion.  Mitral Valve: The mitral valve is normal in structure. Mitral valve regurgitation is not visualized by color flow Doppler.  Tricuspid Valve: The tricuspid valve was normal in structure. Tricuspid valve regurgitation is trivial by color flow Doppler.  Aortic Valve: The aortic valve is tricuspid Aortic valve regurgitation is mild by color flow Doppler. There  is no evidence of aortic valve stenosis.  Pulmonic Valve: The pulmonic valve was normal in structure. Pulmonic valve regurgitation is not visualized by color flow Doppler.   Aorta: The aortic root, ascending aorta and aortic arch are normal in size and structure.   Leslye Peer MD Electronically signed by Leslye Peer MD Signature Date/Time:  10/12/2019/1:40:11 PM    Final  MONITORS  LONG TERM MONITOR (3-14 DAYS) 10/12/2019  Narrative 1: Sinus rhythm/sinus bradycardia/sinus tachycardia 2: Occasional PACs and PVCs 3: Occasional runs of PSVT  CT SCANS  CT CORONARY MORPH W/CTA COR W/SCORE 09/21/2019  Addendum 10/02/2019  9:52 AM ADDENDUM REPORT: 10/02/2019 09:50  EXAM: CT FFR ANALYSIS  CLINICAL DATA:  82 year old male with family history of CAD requiring CABG with abnormal coronary CT  FINDINGS: FFRct analysis was performed on the original cardiac CT angiogram dataset. Diagrammatic representation of the FFRct analysis is provided in a separate PDF document in PACS. This dictation was created using the PDF document and an interactive 3D model of the results. 3D model is not available in the EMR/PACS. Normal FFR range is >0.80.  1. Left Main:  No significant stenosis.  2. LAD: There is significant flow limiting stenosis (FFR 0.68-0.64) approximately 8 mm distal to moderate sized first diagonal branch. 3. LCX: FFR is 0.79 (just below normal of 0.8) after the origin of left circumflex. 4. RCA: There is significant flow limiting stenosis of proximal RCA (FFR 0.66).  IMPRESSION: 1. CT FFR analysis demonstrates significant 3 vessel flow limiting CAD.   Electronically Signed By: Donato Schultz MD On: 10/02/2019 09:50  Addendum 10/01/2019  4:41 PM ADDENDUM REPORT: 10/01/2019 16:39  EXAM: Cardiac/Coronary  CTA  TECHNIQUE: The patient was scanned on a [employer].  FINDINGS: A 100 kV prospective scan was triggered in the descending thoracic aorta at  111 HU's. Axial non-contrast 3 mm slices were carried out through the heart. The data set was analyzed on a dedicated work station and scored using the Agatson method. Gantry rotation speed was 250 msecs and collimation was .6 mm. 0.8 mg of sl NTG was given. The 3D data set was reconstructed in 5% intervals of the 67-82 % of the R-R cycle. Diastolic phases were analyzed on a dedicated work station using MPR, MIP and VRT modes. The patient received 80 cc of contrast.  Aorta:  Normal size.  No calcifications.  No dissection.  Aortic Valve:  Trileaflet.  No calcifications.  Coronary Arteries:  Normal coronary origin.  Right dominance.  RCA is a large dominant artery that gives rise to PDA and PLA. There is dense calcified plaque throughout the proximal to mid vessel with proximal lesion up to 75% just following first area of proximal calcification.  Left main is a large artery that gives rise to LAD and LCX arteries.  LAD is a large vessel that gives rise to moderate size diagonal. There is proximal dense calcified plaque with small caliber mid and distal vessel. Potential severe lesion proximally.  LCX is a non-dominant artery that gives rise to one moderate sized OM1 branch. There is dense calcification of the proximal vessel. Unable to accurately estimate level of stenosis.  Other findings:  3 right sided and 2 left sided pulmonary veins drainage into the left atrium.  Normal left atrial appendage without a thrombus.  Normal size of the pulmonary artery.  IMPRESSION: 1. Coronary calcium score of 1619. This was 80 percentile for age and sex matched control.  2. Normal coronary origin with right dominance.  3. 3 vessel coronary artery disease with what appears to be flow limiting stenosis in proximal LAD, RCA. Dense calcified plaque present. Unable to accurately estimate circumflex stenosis due to dense calcification proximally. CAD-RADS -4.  4.  No significant aortic  atherosclerosis.  5. Recommend cardiac catheterization but prior to this will  send for FFR analysis.   Electronically Signed By: Donato Schultz MD On: 10/01/2019 16:39  Narrative EXAM: OVER-READ INTERPRETATION  CT CHEST  The following report is an over-read performed by radiologist Dr. Trudie Reed of [facility] on 09/21/2019. This over-read does not include interpretation of cardiac or coronary anatomy or pathology. The coronary calcium score/coronary CTA interpretation by the cardiologist is attached.  COMPARISON:  None.  FINDINGS: Within the visualized portions of the thorax there are no suspicious appearing pulmonary nodules or masses, there is no acute consolidative airspace disease, no pleural effusions, no pneumothorax and no lymphadenopathy. Visualized portions of the upper abdomen are unremarkable. There are no aggressive appearing lytic or blastic lesions noted in the visualized portions of the skeleton.  IMPRESSION: 1. No significant incidental noncardiac findings are noted.  Electronically Signed: By: Trudie Reed M.D. On: 09/21/2019 16:22          Risk Assessment/Calculations:            Physical Exam:   VS:  There were no vitals taken for this visit.   Wt Readings from Last 3 Encounters:  10/22/23 186 lb 15.2 oz (84.8 kg)  10/15/23 187 lb (84.8 kg)  09/18/23 180 lb (81.6 kg)    GEN: Well nourished, well developed in no acute distress NECK: No JVD; No carotid bruits CARDIAC: RRR, no murmurs, rubs, gallops RESPIRATORY:  Clear to auscultation without rales, wheezing or rhonchi  ABDOMEN: Soft, non-tender, non-distended EXTREMITIES:  No edema; No deformity   ASSESSMENT AND PLAN: .     Coronary Artery Disease History of 4 vessel CABG, recent cardiac catheterization revealed significant disease in multiple vessels. Recent stress test showed large defect and moderate reduction in uptake in mid to basal anterior lateral location,  consistent with ischemia. EF 44%.  -Patient underwent DES to SVG-OM -No recent chest discomfort or shortness of breath. -Continue Aspirin and Plavix. -Add Lovaza 1g twice daily to lower triglycerides. -Repeat fasting lipid panel, CBC, and basic metabolic panel in 3 months.  Hypotension Recent episode of hypotension during cardiac rehab, no recent episodes. Metoprolol was discontinued. -Discontinue Metoprolol. -Monitor blood pressure.  Hyperlipidemia: LDL goal less than 55.  Triglyceride significantly improved compared to 11 months ago, however still very high around 270.  Added Lovaza 1000 mg twice a day.       Dispo: Follow-up with Dr. Allyson Sabal as previously scheduled  Signed, Azalee Course, [place]

## 2023-11-27 NOTE — Patient Instructions (Signed)
Medication Instructions:  DISCONTINUED METOPROLOL TARTRATE  START LOVAZA 1G TWICE DAILY  *If you need a refill on your cardiac medications before your next appointment, please call your pharmacy*   Lab Work: FASTING LIPIDS, CBC, AND BMET IN 3 MONTHS If you have labs (blood work) drawn today and your tests are completely normal, you will receive your results only by: MyChart Message (if you have MyChart) OR A paper copy in the mail If you have any lab test that is abnormal or we need to change your treatment, we will call you to review the results.   Testing/Procedures: NO TESTING   Follow-Up: At [facility], you and your health needs are our priority.  As part of our continuing mission to provide you with exceptional heart care, we have created designated Provider Care Teams.  These Care Teams include your primary Cardiologist (physician) and Advanced Practice Providers (APPs -  Physician Assistants and Nurse Practitioners) who all work together to provide you with the care you need, when you need it.    Your next appointment:   KEEP FOLLOW UP JANUARY 2025   Provider:   Nanetta Batty, MD

## 2023-11-28 ENCOUNTER — Encounter (HOSPITAL_COMMUNITY)
Admission: RE | Admit: 2023-11-28 | Discharge: 2023-11-28 | Disposition: A | Discharge status: Home / Self Care | Payer: Medicare Other | Source: Ambulatory Visit | Attending: Cardiovascular Disease | Admitting: Cardiovascular Disease

## 2023-11-28 DIAGNOSIS — Z955 Presence of coronary angioplasty implant and graft: Secondary | ICD-10-CM

## 2023-12-01 ENCOUNTER — Encounter (HOSPITAL_COMMUNITY)
Admission: RE | Admit: 2023-12-01 | Discharge: 2023-12-01 | Disposition: A | Discharge status: Home / Self Care | Payer: Medicare Other | Source: Ambulatory Visit | Attending: Cardiovascular Disease

## 2023-12-01 DIAGNOSIS — Z955 Presence of coronary angioplasty implant and graft: Secondary | ICD-10-CM

## 2023-12-05 ENCOUNTER — Encounter (HOSPITAL_COMMUNITY)
Admission: RE | Admit: 2023-12-05 | Discharge: 2023-12-05 | Disposition: A | Discharge status: Home / Self Care | Payer: Medicare Other | Source: Ambulatory Visit | Attending: Cardiovascular Disease

## 2023-12-05 DIAGNOSIS — Z955 Presence of coronary angioplasty implant and graft: Secondary | ICD-10-CM | POA: Diagnosis not present

## 2023-12-08 ENCOUNTER — Encounter (HOSPITAL_COMMUNITY)
Admission: RE | Admit: 2023-12-08 | Discharge: 2023-12-08 | Disposition: A | Discharge status: Home / Self Care | Payer: Medicare Other | Source: Ambulatory Visit | Attending: Cardiovascular Disease | Admitting: Cardiovascular Disease

## 2023-12-08 DIAGNOSIS — Z955 Presence of coronary angioplasty implant and graft: Secondary | ICD-10-CM | POA: Diagnosis not present

## 2023-12-12 ENCOUNTER — Encounter (HOSPITAL_COMMUNITY)
Admission: RE | Admit: 2023-12-12 | Discharge: 2023-12-12 | Disposition: A | Discharge status: Home / Self Care | Payer: Medicare Other | Source: Ambulatory Visit | Attending: Cardiovascular Disease | Admitting: Cardiovascular Disease

## 2023-12-12 DIAGNOSIS — Z955 Presence of coronary angioplasty implant and graft: Secondary | ICD-10-CM | POA: Diagnosis present

## 2023-12-15 ENCOUNTER — Ambulatory Visit: Payer: Medicare Other | Attending: Cardiovascular Disease | Admitting: Cardiovascular Disease

## 2023-12-15 ENCOUNTER — Encounter (HOSPITAL_COMMUNITY)
Admission: RE | Admit: 2023-12-15 | Discharge: 2023-12-15 | Disposition: A | Discharge status: Home / Self Care | Payer: Medicare Other | Source: Ambulatory Visit | Attending: Cardiovascular Disease | Admitting: Cardiovascular Disease

## 2023-12-15 ENCOUNTER — Encounter: Payer: Self-pay | Admitting: Cardiovascular Disease

## 2023-12-15 VITALS — BP 92/56 | HR 79 | Ht 67.0 in | Wt 186.0 lb

## 2023-12-15 DIAGNOSIS — E782 Mixed hyperlipidemia: Secondary | ICD-10-CM

## 2023-12-15 DIAGNOSIS — Z955 Presence of coronary angioplasty implant and graft: Secondary | ICD-10-CM

## 2023-12-15 DIAGNOSIS — Z951 Presence of aortocoronary bypass graft: Secondary | ICD-10-CM | POA: Diagnosis not present

## 2023-12-15 NOTE — Assessment & Plan Note (Signed)
 History of hyperlipidemia on statin therapy with Lovaza  that was recently started.  His most recent lipid profile performed 11/25/2023 revealed total cholesterol 135, LDL of 60 and HDL of 32.  He triglyceride level was 272.  Will recheck a lipid over from 2 months to assess whether his triglycerides have improved on Lovaza .

## 2023-12-15 NOTE — Progress Notes (Signed)
 12/15/2023 Fred [name]   1940/12/11  [id number]  Primary Physician Arloa Elsie SAUNDERS, MD Primary Cardiologist: Dorn JINNY Lesches MD [facility] [place], [place]  HPI:  Fred [name] is a 83 y.o.   mildly overweight weight married Caucasian male father of 2, grandfather of 4 grandchildren who is referred by the ER and Dr. Arloa for evaluation of dizziness.  He retired in 2000 from working at [employer] in [place].  I last saw him in the office 12/17/2022. His cardiac risk factors include treated diabetes and hyperlipidemia.  He does not smoke.  He does have a family history of heart disease with a mother that had CABG and a younger brother who died as well which was found in the evaluation of syncope.  He is never had a heart attack or stroke.  He denies chest pain or shortness of breath.  He is fairly active and routinely walked 3 miles a day up until COVID-19 when he became more sedentary however recently he is try to increase his activity.  On 08/30/2019 he went out for his morning run of 3 miles and at the end he felt weak.  When driving back home he thought that he may have passed out but he was dizzy.  He went to the ER where he was nauseated as well.  ER evaluation was unrevealing.  He said he felt a little bit of chest discomfort this morning when coming to the office but otherwise specifically denied the symptoms.   I performed event monitoring which showed predominantly sinus rhythm with occasional PACs, PVCs and short runs of PSVT. A coronary CTA did show three-vessel disease.  Based on this, I recommended that we proceed with outpatient diagnostic coronary angiography which was done on 10/11/2019 revealing left main/three-vessel disease.  The following day he underwent CABG x4 by Dr. Obadiah with a LIMA to his LAD, vein to ramus branch, obtuse marginal branch and to the PDA.  He was discharged home on 10/20/2019 and has been doing well since.   Since I saw him in  in the office a year ago he has undergone evaluation for ischemia including a 2D echo which showed normal LV function with mild to moderate AI, and abnormal cardiac PET consistent with ischemia.  Based on this he underwent outpatient cardiac catheterization 09/18/2023 by Dr. Elmira revealing patent LIMA to the LAD, patent vein to the PDA with distal disease beyond insertion, occluded to a ramus branch and a high-grade stenosis in the proximal SVG to OM which was successfully stented.  Since that time he feels clinically improved.  He is participating in outpatient cardiac rehab.  It was noticed in rehab that his blood pressure was soft and discontinued.  His pressure today in the office is 92/56 although he is asymptomatic from this.   Current Meds  Medication Sig   Accu-Chek FastClix Lancets MISC USE 1 TO 4 TIMES DAILY AS  NEEDED/DIRECTED   ACCU-CHEK GUIDE test strip USE TO CHECK BLOOD SUGAR DAILY  AS DIRECTED   acetaminophen  (TYLENOL ) 650 MG CR tablet Take 1,300 mg by mouth 2 (two) times daily.   aspirin  81 MG tablet Take 1 tablet (81 mg total) by mouth daily.   Blood Glucose Monitoring Suppl (ACCU-CHEK AVIVA) device by Other route. Use as instructed 1 time daily   clopidogrel  (PLAVIX ) 75 MG tablet Take 1 tablet (75 mg total) by mouth daily with breakfast.   Continuous Glucose Sensor (DEXCOM G6 SENSOR)  MISC by Does not apply route.   dapagliflozin  propanediol (FARXIGA ) 10 MG TABS tablet Take 1 tablet (10 mg total) by mouth daily before breakfast.   Insulin  Pen Needle 32G X 4 MM MISC 1 Device by Does not apply route daily.   levothyroxine  (SYNTHROID ) 25 MCG tablet Take 1 tablet (25 mcg total) by mouth daily before breakfast.   metFORMIN  (GLUCOPHAGE ) 1000 MG tablet Take 1 tablet (1,000 mg total) by mouth 2 (two) times daily with a meal.   Multiple Vitamins-Minerals (MULTIVITAMIN WITH MINERALS) tablet Take 1 tablet by mouth daily.   nitroGLYCERIN  (NITROSTAT ) 0.4 MG SL tablet Place 1 tablet (0.4  mg total) under the tongue every 5 (five) minutes as needed for chest pain.   omega-3 acid ethyl esters (LOVAZA ) 1 g capsule Take 1 capsule (1 g total) by mouth 2 (two) times daily.   rosuvastatin  (CRESTOR ) 20 MG tablet Take 1 tablet (20 mg total) by mouth daily.   Semaglutide , 1 MG/DOSE, 4 MG/3ML SOPN Inject 1 mg as directed once a week.   solifenacin (VESICARE) 10 MG tablet Take 10 mg by mouth daily.   tamsulosin (FLOMAX) 0.4 MG CAPS capsule Take 0.4 mg by mouth daily.   terbinafine (LAMISIL) 250 MG tablet Take 250 mg by mouth daily.   TOUJEO  MAX SOLOSTAR 300 UNIT/ML Solostar Pen Inject 40 Units into the skin daily.     No Known Allergies  Social History   Socioeconomic History   Marital status: Widowed    Spouse name: Not on file   Number of children: Not on file   Years of education: 12   Highest education level: Some college, no degree  Occupational History   Occupation: Retired  Tobacco Use   Smoking status: Former    Current packs/day: 0.00    Types: Cigarettes    Start date: 12/1952    Quit date: 12/1962    Years since quitting: 61.0   Smokeless tobacco: Never  Substance and Sexual Activity   Alcohol use: No   Drug use: No   Sexual activity: Not on file  Other Topics Concern   Not on file  Social History Narrative   Not on file   Social Drivers of Health   Financial Resource Strain: Not on file  Food Insecurity: Not on file  Transportation Needs: Not on file  Physical Activity: Not on file  Stress: Not on file  Social Connections: Not on file  Intimate Partner Violence: Not on file     Review of Systems: General: negative for chills, fever, night sweats or weight changes.  Cardiovascular: negative for chest pain, dyspnea on exertion, edema, orthopnea, palpitations, paroxysmal nocturnal dyspnea or shortness of breath Dermatological: negative for rash Respiratory: negative for cough or wheezing Urologic: negative for hematuria Abdominal: negative for  nausea, vomiting, diarrhea, bright red blood per rectum, melena, or hematemesis Neurologic: negative for visual changes, syncope, or dizziness All other systems reviewed and are otherwise negative except as noted above.    Blood pressure (!) 92/56, pulse 79, height 5' 7 (1.702 m), weight 186 lb (84.4 kg), SpO2 95%.  General appearance: alert and no distress Neck: no adenopathy, no carotid bruit, no JVD, supple, symmetrical, trachea midline, and thyroid  not enlarged, symmetric, no tenderness/mass/nodules Lungs: clear to auscultation bilaterally Heart: regular rate and rhythm, S1, S2 normal, no murmur, click, rub or gallop Extremities: extremities normal, atraumatic, no cyanosis or edema Pulses: 2+ and symmetric Skin: Skin color, texture, turgor normal. No rashes or lesions Neurologic: Grossly  normal  EKG not performed today      ASSESSMENT AND PLAN:   Hyperlipidemia History of hyperlipidemia on statin therapy with Lovaza  that was recently started.  His most recent lipid profile performed 11/25/2023 revealed total cholesterol 135, LDL of 60 and HDL of 32.  He triglyceride level was 272.  Will recheck a lipid over from 2 months to assess whether his triglycerides have improved on Lovaza .  S/P CABG x 4 History of CAD status post cardiac catheterization performed 10/11/2019 revealing left main/three-vessel disease.  The following day he underwent CABG x 4 by Dr. Alvan right the LIMA to his LAD, vein to a ramus branch, obtuse marginal branch and to the PDA.  He was discharged home on 10/20/2019.  Because of feelings of fatigue he underwent outpatient evaluation by how making PA-C including a 2D echo and a cardiac PET study.  Echo performed 08/26/2023 revealed normal LV systolic function with grade 1 diastolic dysfunction and mild to moderate aortic insufficiency.  Cardiac PET was intermediate risk with findings consistent with ischemia.  Based on this he underwent outpatient cardiac  catheterization by Dr. Elmira revealing a patent LIMA to the LAD, patent vein to the PDA with distal disease in the native PDA beyond the graft insertion, occluded vein to ramus branch and a high-grade proximal SVG stenosis to an OM branch which was stented successfully.  Patient says since that time he has had more energy and denies chest pain.SABRA Dorn DOROTHA Court MD FACP,FACC,FAHA, [facility] 12/15/2023 10:38 AM

## 2023-12-15 NOTE — Assessment & Plan Note (Signed)
 History of CAD status post cardiac catheterization performed 10/11/2019 revealing left main/three-vessel disease.  The following day he underwent CABG x 4 by Dr. Alvan right the LIMA to his LAD, vein to a ramus branch, obtuse marginal branch and to the PDA.  He was discharged home on 10/20/2019.  Because of feelings of fatigue he underwent outpatient evaluation by how making PA-C including a 2D echo and a cardiac PET study.  Echo performed 08/26/2023 revealed normal LV systolic function with grade 1 diastolic dysfunction and mild to moderate aortic insufficiency.  Cardiac PET was intermediate risk with findings consistent with ischemia.  Based on this he underwent outpatient cardiac catheterization by Dr. Elmira revealing a patent LIMA to the LAD, patent vein to the PDA with distal disease in the native PDA beyond the graft insertion, occluded vein to ramus branch and a high-grade proximal SVG stenosis to an OM branch which was stented successfully.  Patient says since that time he has had more energy and denies chest pain.SABRA

## 2023-12-15 NOTE — Patient Instructions (Signed)
 Medication Instructions:  Your physician recommends that you continue on your current medications as directed. Please refer to the Current Medication list given to you today.  *If you need a refill on your cardiac medications before your next appointment, please call your pharmacy*   Lab Work: Your physician recommends that you return for lab work in: 2 months for FASTING lipids  If you have labs (blood work) drawn today and your tests are completely normal, you will receive your results only by: MyChart Message (if you have MyChart) OR A paper copy in the mail If you have any lab test that is abnormal or we need to change your treatment, we will call you to review the results.    Follow-Up: At [facility], you and your health needs are our priority.  As part of our continuing mission to provide you with exceptional heart care, we have created designated Provider Care Teams.  These Care Teams include your primary Cardiologist (physician) and Advanced Practice Providers (APPs -  Physician Assistants and Nurse Practitioners) who all work together to provide you with the care you need, when you need it.  We recommend signing up for the patient portal called MyChart.  Sign up information is provided on this After Visit Summary.  MyChart is used to connect with patients for Virtual Visits (Telemedicine).  Patients are able to view lab/test results, encounter notes, upcoming appointments, etc.  Non-urgent messages can be sent to your provider as well.   To learn more about what you can do with MyChart, go to [email or web address].    Your next appointment:   6 month(s)  Provider:   Hao Meng, PA-C       Then, Dorn Lesches, MD will plan to see you again in 12 month(s).

## 2023-12-16 NOTE — Progress Notes (Signed)
 Cardiac Individual Treatment Plan  Patient Details  Name: Fred [name] MRN: [id number] Date of Birth: [birth year] Referring Provider:   Flowsheet Row INTENSIVE CARDIAC REHAB ORIENT from 10/22/2023 in [facility] [place] for Heart, Vascular, & Lung Health  Referring Provider Dorn Lesches, MD       Initial Encounter Date:  Flowsheet Row INTENSIVE CARDIAC REHAB ORIENT from 10/22/2023 in [facility] for Heart, Vascular, & Lung Health  Date 10/22/23       Visit Diagnosis: 09/18/23 DES SVG-OM  Patient's Home Medications on Admission:  Current Outpatient Medications:    Accu-Chek FastClix Lancets MISC, USE 1 TO 4 TIMES DAILY AS  NEEDED/DIRECTED, Disp: 408 each, Rfl: 3   ACCU-CHEK GUIDE test strip, USE TO CHECK BLOOD SUGAR DAILY  AS DIRECTED, Disp: 100 strip, Rfl: 10   acetaminophen  (TYLENOL ) 650 MG CR tablet, Take 1,300 mg by mouth 2 (two) times daily., Disp: , Rfl:    aspirin  81 MG tablet, Take 1 tablet (81 mg total) by mouth daily., Disp: , Rfl:    Blood Glucose Monitoring Suppl (ACCU-CHEK AVIVA) device, by Other route. Use as instructed 1 time daily, Disp: , Rfl:    clopidogrel  (PLAVIX ) 75 MG tablet, Take 1 tablet (75 mg total) by mouth daily with breakfast., Disp: 90 tablet, Rfl: 3   clotrimazole (LOTRIMIN) 1 % cream, Apply 1 application  topically daily as needed (Groin). (Patient not taking: Reported on 12/15/2023), Disp: , Rfl:    Continuous Glucose Sensor (DEXCOM G6 SENSOR) MISC, by Does not apply route., Disp: , Rfl:    dapagliflozin  propanediol (FARXIGA ) 10 MG TABS tablet, Take 1 tablet (10 mg total) by mouth daily before breakfast., Disp: 90 tablet, Rfl: 3   Insulin  Pen Needle 32G X 4 MM MISC, 1 Device by Does not apply route daily., Disp: 100 each, Rfl: 3   levothyroxine  (SYNTHROID ) 25 MCG tablet, Take 1 tablet (25 mcg total) by mouth daily before breakfast., Disp: 90 tablet, Rfl: 3   metFORMIN  (GLUCOPHAGE ) 1000 MG tablet, Take 1 tablet  (1,000 mg total) by mouth 2 (two) times daily with a meal., Disp: 180 tablet, Rfl: 3   Multiple Vitamins-Minerals (MULTIVITAMIN WITH MINERALS) tablet, Take 1 tablet by mouth daily., Disp: , Rfl:    nitroGLYCERIN  (NITROSTAT ) 0.4 MG SL tablet, Place 1 tablet (0.4 mg total) under the tongue every 5 (five) minutes as needed for chest pain., Disp: 25 tablet, Rfl: 3   omega-3 acid ethyl esters (LOVAZA ) 1 g capsule, Take 1 capsule (1 g total) by mouth 2 (two) times daily., Disp: 60 capsule, Rfl: 11   rosuvastatin  (CRESTOR ) 20 MG tablet, Take 1 tablet (20 mg total) by mouth daily., Disp: 90 tablet, Rfl: 3   Semaglutide , 1 MG/DOSE, 4 MG/3ML SOPN, Inject 1 mg as directed once a week., Disp: 9 mL, Rfl: 3   solifenacin (VESICARE) 10 MG tablet, Take 10 mg by mouth daily., Disp: , Rfl:    tamsulosin (FLOMAX) 0.4 MG CAPS capsule, Take 0.4 mg by mouth daily., Disp: , Rfl:    terbinafine (LAMISIL) 250 MG tablet, Take 250 mg by mouth daily., Disp: , Rfl:    TOUJEO  MAX SOLOSTAR 300 UNIT/ML Solostar Pen, Inject 40 Units into the skin daily., Disp: 15 mL, Rfl: 6  Past Medical History: Past Medical History:  Diagnosis Date   Coronary artery disease    Diabetes mellitus without complication (HCC)    High cholesterol    Vertigo     Tobacco Use:  Social History   Tobacco Use  Smoking Status Former   Current packs/day: 0.00   Types: Cigarettes   Start date: 12/1952   Quit date: 12/1962   Years since quitting: 61.0  Smokeless Tobacco Never    Labs: Review Flowsheet  More data exists      Latest Ref Rng & Units 02/04/2022 06/03/2022 12/04/2022 06/05/2023 11/25/2023  Labs for ITP Cardiac and Pulmonary Rehab  Cholestrol 100 - 199 mg/dL - - 848  - 864   LDL (calc) 0 - 99 mg/dL - - - - 60   Direct LDL mg/dL - - 48.9  - -  HDL-C >60 mg/dL - - 67.29  - 32   Trlycerides 0 - 149 mg/dL - - 602.9  - 727   Hemoglobin A1c 4.0 - 5.6 % 9.5  8.1  8.5  7.3  -    Capillary Blood Glucose: Lab Results  Component Value  Date   GLUCAP 146 (H) 10/24/2023   GLUCAP 122 (H) 10/24/2023   GLUCAP 159 (H) 10/22/2023   GLUCAP 105 (H) 09/18/2023   GLUCAP 137 (H) 09/18/2023     Exercise Target Goals: Exercise Program Goal: Individual exercise prescription set using results from initial 6 min walk test and THRR while considering  patient's activity barriers and safety.   Exercise Prescription Goal: Initial exercise prescription builds to 30-45 minutes a day of aerobic activity, 2-3 days per week.  Home exercise guidelines will be given to patient during program as part of exercise prescription that the participant will acknowledge.  Activity Barriers & Risk Stratification:  Activity Barriers & Cardiac Risk Stratification - 10/22/23 1108       Activity Barriers & Cardiac Risk Stratification   Activity Barriers Arthritis;Balance Concerns;Deconditioning;Joint Problems    Cardiac Risk Stratification High   <5 METs on            6 Minute Walk:  6 Minute Walk     Row Name 10/22/23 1214         6 Minute Walk   Phase Initial     Distance 1350 feet     Walk Time 6 minutes     # of Rest Breaks 0     MPH 2.56     METS 1.89     RPE 11     Perceived Dyspnea  0     VO2 Peak 6.61     Symptoms No     Resting HR 72 bpm     Resting BP 102/60     Resting Oxygen Saturation  98 %     Exercise Oxygen Saturation  during 6 min walk 98 %     Max Ex. HR 81 bpm     Max Ex. BP 114/62     2 Minute Post BP 100/54              Oxygen Initial Assessment:   Oxygen Re-Evaluation:   Oxygen Discharge (Final Oxygen Re-Evaluation):   Initial Exercise Prescription:  Initial Exercise Prescription - 10/22/23 1200       Date of Initial Exercise RX and Referring Provider   Date 10/22/23    Referring Provider Dorn Lesches, MD    Expected Discharge Date 01/14/24      NuStep   Level 1    SPM 70    Minutes 15    METs 2      Track   Laps 16    Minutes 15    METs 2  Prescription Details    Frequency (times per week) 3    Duration Progress to 30 minutes of continuous aerobic without signs/symptoms of physical distress      Intensity   THRR 40-80% of Max Heartrate 55-110    Ratings of Perceived Exertion 11-13    Perceived Dyspnea 0-4      Progression   Progression Continue to progress workloads to maintain intensity without signs/symptoms of physical distress.      Resistance Training   Training Prescription Yes    Weight 3    Reps 10-15             Perform Capillary Blood Glucose checks as needed.  Exercise Prescription Changes:   Exercise Prescription Changes     Row Name 10/24/23 1253 11/14/23 1400 11/28/23 1500 12/12/23 1600       Response to Exercise   Blood Pressure (Admit) 108/72 114/80 108/78 102/64    Blood Pressure (Exercise) 112/58 112/52 -- --    Blood Pressure (Exit) 114/70 102/70 120/64 104/68    Heart Rate (Admit) 69 bpm 77 bpm 75 bpm 72 bpm    Heart Rate (Exercise) 91 bpm 94 bpm 102 bpm 115 bpm    Heart Rate (Exit) 77 bpm 82 bpm 80 bpm 88 bpm    Rating of Perceived Exertion (Exercise) 11 11 12 11     Symptoms None None None None    Comments Off to a good start with exercise. Reviewed METs Reviewed METs and goals Reviewed METs    Duration Continue with 30 min of aerobic exercise without signs/symptoms of physical distress. Continue with 30 min of aerobic exercise without signs/symptoms of physical distress. Continue with 30 min of aerobic exercise without signs/symptoms of physical distress. Continue with 30 min of aerobic exercise without signs/symptoms of physical distress.    Intensity THRR unchanged THRR unchanged THRR unchanged THRR unchanged      Progression   Progression Continue to progress workloads to maintain intensity without signs/symptoms of physical distress. Continue to progress workloads to maintain intensity without signs/symptoms of physical distress. Continue to progress workloads to maintain intensity without signs/symptoms  of physical distress. Continue to progress workloads to maintain intensity without signs/symptoms of physical distress.    Average METs 2.4 2.9 3.15 3.2      Resistance Training   Training Prescription Yes Yes Yes Yes    Weight 3 lbs 3 lbs 3 lbs 4 lbs    Reps 10-15 10-15 10-15 10-15    Time 10 Minutes 10 Minutes 10 Minutes 10 Minutes      Interval Training   Interval Training No No No No      NuStep   Level 1 2 3 3     SPM 83 100 110 133    Minutes 15 15 15 15     METs 2 2.6 2.9 3.1      Track   Laps 15 17 17 18     Minutes 15 15 15 15     METs 2.91 3.17 3.17 3.3             Exercise Comments:   Exercise Comments     Row Name 10/24/23 1347 11/14/23 1544 11/28/23 1500 12/12/23 1603     Exercise Comments Rece tolerated low intensity exercise well without symptoms. Reviewed METs with patient today. Increased workload on nustep today. Pt is making progress. Reviewed METs and goals. Will consider workload increase on nustep soon. Reviewed METs today. METs are at plateau, encouraged increase.  Exercise Goals and Review:   Exercise Goals     Row Name 10/22/23 1111             Exercise Goals   Increase Physical Activity Yes       Intervention Provide advice, education, support and counseling about physical activity/exercise needs.;Develop an individualized exercise prescription for aerobic and resistive training based on initial evaluation findings, risk stratification, comorbidities and participant's personal goals.       Expected Outcomes Short Term: Attend rehab on a regular basis to increase amount of physical activity.;Long Term: Exercising regularly at least 3-5 days a week.;Long Term: Add in home exercise to make exercise part of routine and to increase amount of physical activity.       Increase Strength and Stamina Yes       Intervention Provide advice, education, support and counseling about physical activity/exercise needs.;Develop an  individualized exercise prescription for aerobic and resistive training based on initial evaluation findings, risk stratification, comorbidities and participant's personal goals.       Expected Outcomes Long Term: Improve cardiorespiratory fitness, muscular endurance and strength as measured by increased METs and functional capacity ( );Short Term: Increase workloads from initial exercise prescription for resistance, speed, and METs.;Short Term: Perform resistance training exercises routinely during rehab and add in resistance training at home       Able to understand and use rate of perceived exertion (RPE) scale Yes       Intervention Provide education and explanation on how to use RPE scale       Expected Outcomes Short Term: Able to use RPE daily in rehab to express subjective intensity level;Long Term:  Able to use RPE to guide intensity level when exercising independently       Knowledge and understanding of Target Heart Rate Range (THRR) Yes       Intervention Provide education and explanation of THRR including how the numbers were predicted and where they are located for reference       Expected Outcomes Short Term: Able to state/look up THRR;Short Term: Able to use daily as guideline for intensity in rehab;Long Term: Able to use THRR to govern intensity when exercising independently       Understanding of Exercise Prescription Yes       Intervention Provide education, explanation, and written materials on patient's individual exercise prescription       Expected Outcomes Short Term: Able to explain program exercise prescription;Long Term: Able to explain home exercise prescription to exercise independently                Exercise Goals Re-Evaluation :  Exercise Goals Re-Evaluation     Row Name 10/24/23 1347 11/28/23 1500           Exercise Goal Re-Evaluation   Exercise Goals Review Increase Physical Activity;Increase Strength and Stamina;Able to understand and use rate of  perceived exertion (RPE) scale Increase Physical Activity;Increase Strength and Stamina;Able to understand and use rate of perceived exertion (RPE) scale;Knowledge and understanding of Target Heart Rate Range (THRR);Understanding of Exercise Prescription      Comments Fred [name] was able to understand and use RPE scale appropriately. Reviewed METs and goals. Pt voices he is making progress on his goal of increased strength and stamina. Peak METs to date are 3.5.      Expected Outcomes Progress workloads as tolerated to help increase strength and stamina. Continue to progress workloads as tolerated to help increase strength and stamina.  Discharge Exercise Prescription (Final Exercise Prescription Changes):  Exercise Prescription Changes - 12/12/23 1600       Response to Exercise   Blood Pressure (Admit) 102/64    Blood Pressure (Exit) 104/68    Heart Rate (Admit) 72 bpm    Heart Rate (Exercise) 115 bpm    Heart Rate (Exit) 88 bpm    Rating of Perceived Exertion (Exercise) 11    Symptoms None    Comments Reviewed METs    Duration Continue with 30 min of aerobic exercise without signs/symptoms of physical distress.    Intensity THRR unchanged      Progression   Progression Continue to progress workloads to maintain intensity without signs/symptoms of physical distress.    Average METs 3.2      Resistance Training   Training Prescription Yes    Weight 4 lbs    Reps 10-15    Time 10 Minutes      Interval Training   Interval Training No      NuStep   Level 3    SPM 133    Minutes 15    METs 3.1      Track   Laps 18    Minutes 15    METs 3.3             Nutrition:  Target Goals: Understanding of nutrition guidelines, daily intake of sodium 1500mg , cholesterol 200mg , calories 30% from fat and 7% or less from saturated fats, daily to have 5 or more servings of fruits and vegetables.  Biometrics:  Pre Biometrics - 10/22/23 1038       Pre Biometrics    Waist Circumference 44 inches    Hip Circumference 43.5 inches    Waist to Hip Ratio 1.01 %    Triceps Skinfold 8 mm    % Body Fat 28.2 %    Grip Strength 28 kg    Flexibility 0 in   could not reach   Single Leg Stand 12.87 seconds              Nutrition Therapy Plan and Nutrition Goals:  Nutrition Therapy & Goals - 10/24/23 1451       Nutrition Therapy   Diet Heart healthy diet    Drug/Food Interactions Statins/Certain Fruits      Personal Nutrition Goals   Nutrition Goal Patient to identify strategies for reducing cardiovascular risk by attending the [employer] education and nutrition series weekly.    Personal Goal #2 Patient to improve diet quality by using the plate method as a guide for meal planning to include lean protein/plant protein, fruits, vegetables, whole grains, nonfat dairy as part of a well-balanced diet.    Comments Patient has medical history of CAD, DM2, Hyperlipidemia, s/p CABGx4, His A1c remains above goal at 7.3. HIs LDl remains well controlled but triglycerides are elevated. Patient will benefit from participation in intensive cardiac rehab for nutrition, exercise, and lifestyle modification.      Intervention Plan   Intervention Prescribe, educate and counsel regarding individualized specific dietary modifications aiming towards targeted core components such as weight, hypertension, lipid management, diabetes, heart failure and other comorbidities.;Nutrition handout(s) given to patient.    Expected Outcomes Short Term Goal: Understand basic principles of dietary content, such as calories, fat, sodium, cholesterol and nutrients.;Long Term Goal: Adherence to prescribed nutrition plan.             Nutrition Assessments:  Nutrition Assessments - 10/31/23 1528       Rate  Your Plate Scores   Pre Score 59            MEDIFICTS Score Key: >=70 Need to make dietary changes  40-70 Heart Healthy Diet <= 40 Therapeutic Level Cholesterol Diet    Flowsheet Row INTENSIVE CARDIAC REHAB from 10/31/2023 in [facility] Heart, Vascular, & Lung Health  Picture Your Plate Total Score on Admission 59      Picture Your Plate Scores: <59 Unhealthy dietary pattern with much room for improvement. 41-50 Dietary pattern unlikely to meet recommendations for good health and room for improvement. 51-60 More healthful dietary pattern, with some room for improvement.  >60 Healthy dietary pattern, although there may be some specific behaviors that could be improved.    Nutrition Goals Re-Evaluation:  Nutrition Goals Re-Evaluation     Row Name 10/24/23 1451             Goals   Current Weight 184 lb 4.9 oz (83.6 kg)       Comment A1c 7.3, LDL 51, HDL 32, triglycerides 397       Expected Outcome Patient has medical history of CAD, DM2, Hyperlipidemia, s/p CABGx4, His A1c remains above goal at 7.3. HIs LDl remains well controlled but triglycerides are elevated. Patient will benefit from participation in intensive cardiac rehab for nutrition, exercise, and lifestyle modification.                Nutrition Goals Re-Evaluation:  Nutrition Goals Re-Evaluation     Row Name 10/24/23 1451             Goals   Current Weight 184 lb 4.9 oz (83.6 kg)       Comment A1c 7.3, LDL 51, HDL 32, triglycerides 397       Expected Outcome Patient has medical history of CAD, DM2, Hyperlipidemia, s/p CABGx4, His A1c remains above goal at 7.3. HIs LDl remains well controlled but triglycerides are elevated. Patient will benefit from participation in intensive cardiac rehab for nutrition, exercise, and lifestyle modification.                Nutrition Goals Discharge (Final Nutrition Goals Re-Evaluation):  Nutrition Goals Re-Evaluation - 10/24/23 1451       Goals   Current Weight 184 lb 4.9 oz (83.6 kg)    Comment A1c 7.3, LDL 51, HDL 32, triglycerides 397    Expected Outcome Patient has medical history of CAD, DM2,  Hyperlipidemia, s/p CABGx4, His A1c remains above goal at 7.3. HIs LDl remains well controlled but triglycerides are elevated. Patient will benefit from participation in intensive cardiac rehab for nutrition, exercise, and lifestyle modification.             Psychosocial: Target Goals: Acknowledge presence or absence of significant depression and/or stress, maximize coping skills, provide positive support system. Participant is able to verbalize types and ability to use techniques and skills needed for reducing stress and depression.  Initial Review & Psychosocial Screening:  Initial Psych Review & Screening - 10/22/23 1112       Initial Review   Current issues with None Identified      Family Dynamics   Good Support System? Yes   Fred [name] has his daughters for support     Barriers   Psychosocial barriers to participate in program There are no identifiable barriers or psychosocial needs.      Screening Interventions   Interventions Encouraged to exercise;Provide feedback about the scores to participant    Expected  Outcomes Short Term goal: Identification and review with participant of any Quality of Life or Depression concerns found by scoring the questionnaire.;Long Term goal: The participant improves quality of Life and PHQ9 Scores as seen by post scores and/or verbalization of changes             Quality of Life Scores:  Quality of Life - 10/22/23 1122       Quality of Life   Select Quality of Life      Quality of Life Scores   Health/Function Pre 27.43 %    Socioeconomic Pre 26.25 %    Psych/Spiritual Pre 29.64 %    Family Pre 28 %    GLOBAL Pre 27.77 %            Scores of 19 and below usually indicate a poorer quality of life in these areas.  A difference of  2-3 points is a clinically meaningful difference.  A difference of 2-3 points in the total score of the Quality of Life Index has been associated with significant improvement in overall quality of life,  self-image, physical symptoms, and general health in studies assessing change in quality of life.  PHQ-9: Review Flowsheet       10/22/2023 03/20/2020 01/06/2020  Depression screen PHQ 2/9  Decreased Interest 0 0 0  Down, Depressed, Hopeless 0 0 0  PHQ - 2 Score 0 0 0  Altered sleeping 0 - -  Tired, decreased energy 0 - -  Change in appetite 0 - -  Feeling bad or failure about yourself  0 - -  Trouble concentrating 0 - -  Moving slowly or fidgety/restless 0 - -  Suicidal thoughts 0 - -  PHQ-9 Score 0 - -  Difficult doing work/chores Not difficult at all - -   Interpretation of Total Score  Total Score Depression Severity:  1-4 = Minimal depression, 5-9 = Mild depression, 10-14 = Moderate depression, 15-19 = Moderately severe depression, 20-27 = Severe depression   Psychosocial Evaluation and Intervention:   Psychosocial Re-Evaluation:  Psychosocial Re-Evaluation     Row Name 10/24/23 1701 11/19/23 1633 12/16/23 0827         Psychosocial Re-Evaluation   Current issues with None Identified None Identified None Identified     Interventions Encouraged to attend Cardiac Rehabilitation for the exercise Encouraged to attend Cardiac Rehabilitation for the exercise Encouraged to attend Cardiac Rehabilitation for the exercise     Continue Psychosocial Services  No Follow up required No Follow up required No Follow up required              Psychosocial Discharge (Final Psychosocial Re-Evaluation):  Psychosocial Re-Evaluation - 12/16/23 0827       Psychosocial Re-Evaluation   Current issues with None Identified    Interventions Encouraged to attend Cardiac Rehabilitation for the exercise    Continue Psychosocial Services  No Follow up required             Vocational Rehabilitation: Provide vocational rehab assistance to qualifying candidates.   Vocational Rehab Evaluation & Intervention:  Vocational Rehab - 10/22/23 1114       Initial Vocational Rehab Evaluation  & Intervention   Assessment shows need for Vocational Rehabilitation No   Fred [name] is retired            Education: Education Goals: Education classes will be provided on a weekly basis, covering required topics. Participant will state understanding/return demonstration of topics presented.    Education  Row Name 10/24/23 1300     Education   Cardiac Education Topics [employer]   Hospital Doctor Education   General Education Heart Disease Risk Reduction   Instruction Review Code 1- Verbalizes Understanding   Class Start Time 1157   Class Stop Time 1234   Class Time Calculation (min) 37 min    Row Name 10/27/23 1300     Education   Cardiac Education Topics [employer]   Select Workshops     Workshops   Educator Exercise Physiologist   Select Exercise   Exercise Workshop Location Manager and Fall Prevention   Instruction Review Code 1- Verbalizes Understanding   Class Start Time 1150   Class Stop Time 1240   Class Time Calculation (min) 50 min    Row Name 10/31/23 1300     Education   Cardiac Education Topics [employer]   Licensed Conveyancer Nutrition   Nutrition Overview of the [employer] Eating Plan   Instruction Review Code 1- Verbalizes Understanding   Class Start Time 1145   Class Stop Time 1221   Class Time Calculation (min) 36 min    Row Name 11/03/23 1300     Education   Cardiac Education Topics [employer]   Nurse, Children's Exercise Physiologist   Select Psychosocial   Psychosocial Healthy Minds, Bodies, Hearts   Instruction Review Code 1- Verbalizes Understanding   Class Start Time 1142   Class Stop Time 1218   Class Time Calculation (min) 36 min    Row Name 11/05/23 1200     Education   Cardiac Education Topics [employer]   Select Core Videos     Core Videos   Educator Nurse   Select Nutrition    Nutrition Becoming a [employer] Chef   Instruction Review Code 1- Verbalizes Understanding   Class Start Time 1146   Class Stop Time 1224   Class Time Calculation (min) 38 min    Row Name 11/10/23 1400     Education   Cardiac Education Topics [employer]   Select Core Videos     Core Videos   Educator Exercise Physiologist   Select Nutrition   Nutrition Other   Instruction Review Code 1- Verbalizes Understanding   Class Start Time 1151   Class Stop Time 1227   Class Time Calculation (min) 36 min    Row Name 11/14/23 1500     Education   Cardiac Education Topics [employer]   Glass Blower/designer Nutrition   Nutrition Workshop Label Reading   Instruction Review Code 1- Verbalizes Understanding   Class Start Time 1144   Class Stop Time 1219   Class Time Calculation (min) 35 min    Row Name 11/17/23 1600     Education   Cardiac Education Topics [employer]   Select Workshops     Workshops   Educator Exercise Physiologist   Select Psychosocial   Psychosocial Workshop Recognizing and Reducing Stress   Instruction Review Code 1- Verbalizes Understanding   Class Start Time 1142   Class Stop Time 1231   Class Time Calculation (min) 49 min    Row Name 11/19/23 1400     Education   Cardiac Education Topics [employer]   [employer]  Sports Administrator - Meals in a Snap   Instruction Review Code 1- Verbalizes Understanding   Class Start Time 1143   Class Stop Time 1226   Class Time Calculation (min) 43 min    Row Name 11/21/23 1500     Education   Cardiac Education Topics Pritikin   Nurse, Children's   Educator Dietitian   Select Nutrition   Nutrition Calorie Density   Instruction Review Code 1- Verbalizes Understanding   Class Start Time 1149   Class Stop Time 1227   Class Time Calculation (min) 38 min    Row Name 11/24/23 1600      Education   Cardiac Education Topics Pritikin   Select Workshops     Workshops   Educator Exercise Physiologist   Select Exercise   Exercise Workshop Exercise Basics: Building Your Action Plan   Instruction Review Code 1- Verbalizes Understanding   Class Start Time 1155   Class Stop Time 1237   Class Time Calculation (min) 42 min    Row Name 11/26/23 1300     Education   Cardiac Education Topics Pritikin   Orthoptist   Educator Dietitian   Weekly Topic One-Pot Wonders   Instruction Review Code 1- Verbalizes Understanding   Class Start Time 1144   Class Stop Time 1219   Class Time Calculation (min) 35 min    Row Name 11/28/23 1200     Education   Cardiac Education Topics Pritikin   Psychologist, Forensic Exercise Education   Exercise Education Move It!   Instruction Review Code 1- Verbalizes Understanding   Class Start Time 1150   Class Stop Time 1230   Class Time Calculation (min) 40 min    Row Name 12/01/23 1100     Education   Cardiac Education Topics Pritikin   Psychologist, Forensic General Education   General Education Hypertension and Heart Disease   Instruction Review Code 1- Verbalizes Understanding   Class Start Time 1147   Class Stop Time 1221   Class Time Calculation (min) 34 min    Row Name 12/05/23 1400     Education   Cardiac Education Topics Pritikin   Glass Blower/designer Nutrition   Nutrition Workshop Targeting Your Nutrition Priorities   Instruction Review Code 1- Verbalizes Understanding   Class Start Time 1145   Class Stop Time 1228   Class Time Calculation (min) 43 min    Row Name 12/08/23 1300     Education   Cardiac Education Topics Pritikin   [employer]     Workshops   Educator Exercise Physiologist   Select Psychosocial    Psychosocial Workshop Focused Goals, Sustainable Changes   Instruction Review Code 1- Verbalizes Understanding   Class Start Time 1143   Class Stop Time 1222   Class Time Calculation (min) 39 min    Row Name 12/12/23 1400     Education   Cardiac Education Topics Pritikin   Licensed Conveyancer Nutrition   Nutrition Dining Out - Part 1   Instruction Review Code 1-  Verbalizes Understanding   Class Start Time 1358   Class Stop Time 1442   Class Time Calculation (min) 44 min    Row Name 12/15/23 1100     Education   Cardiac Education Topics Pritikin   Select Core Videos     Core Videos   Educator Exercise Physiologist   Select Exercise Education   Exercise Education Biomechanial Limitations   Instruction Review Code 1- Verbalizes Understanding   Class Start Time 1144   Class Stop Time 1217   Class Time Calculation (min) 33 min            Core Videos: Exercise    Move It!  Clinical staff conducted group or individual video education with verbal and written material and guidebook.  Patient learns the recommended Pritikin exercise program. Exercise with the goal of living a long, healthy life. Some of the health benefits of exercise include controlled diabetes, healthier blood pressure levels, improved cholesterol levels, improved heart and lung capacity, improved sleep, and better body composition. Everyone should speak with their doctor before starting or changing an exercise routine.  Biomechanical Limitations Clinical staff conducted group or individual video education with verbal and written material and guidebook.  Patient learns how biomechanical limitations can impact exercise and how we can mitigate and possibly overcome limitations to have an impactful and balanced exercise routine.  Body Composition Clinical staff conducted group or individual video education with verbal and written material and guidebook.  Patient  learns that body composition (ratio of muscle mass to fat mass) is a key component to assessing overall fitness, rather than body weight alone. Increased fat mass, especially visceral belly fat, can put us  at increased risk for metabolic syndrome, type 2 diabetes, heart disease, and even death. It is recommended to combine diet and exercise (cardiovascular and resistance training) to improve your body composition. Seek guidance from your physician and exercise physiologist before implementing an exercise routine.  Exercise Action Plan Clinical staff conducted group or individual video education with verbal and written material and guidebook.  Patient learns the recommended strategies to achieve and enjoy long-term exercise adherence, including variety, self-motivation, self-efficacy, and positive decision making. Benefits of exercise include fitness, good health, weight management, more energy, better sleep, less stress, and overall well-being.  Medical   Heart Disease Risk Reduction Clinical staff conducted group or individual video education with verbal and written material and guidebook.  Patient learns our heart is our most vital organ as it circulates oxygen, nutrients, white blood cells, and hormones throughout the entire body, and carries waste away. Data supports a plant-based eating plan like the Pritikin Program for its effectiveness in slowing progression of and reversing heart disease. The video provides a number of recommendations to address heart disease.   Metabolic Syndrome and Belly Fat  Clinical staff conducted group or individual video education with verbal and written material and guidebook.  Patient learns what metabolic syndrome is, how it leads to heart disease, and how one can reverse it and keep it from coming back. You have metabolic syndrome if you have 3 of the following 5 criteria: abdominal obesity, high blood pressure, high triglycerides, low HDL cholesterol, and high  blood sugar.  Hypertension and Heart Disease Clinical staff conducted group or individual video education with verbal and written material and guidebook.  Patient learns that high blood pressure, or hypertension, is very common in the [place] . Hypertension is largely due to excessive salt intake, but other important risk factors include being  overweight, physical inactivity, drinking too much alcohol, smoking, and not eating enough potassium from fruits and vegetables. High blood pressure is a leading risk factor for heart attack, stroke, congestive heart failure, dementia, kidney failure, and premature death. Long-term effects of excessive salt intake include stiffening of the arteries and thickening of heart muscle and organ damage. Recommendations include ways to reduce hypertension and the risk of heart disease.  Diseases of Our Time - Focusing on Diabetes Clinical staff conducted group or individual video education with verbal and written material and guidebook.  Patient learns why the best way to stop diseases of our time is prevention, through food and other lifestyle changes. Medicine (such as prescription pills and surgeries) is often only a Band-Aid on the problem, not a long-term solution. Most common diseases of our time include obesity, type 2 diabetes, hypertension, heart disease, and cancer. The Pritikin Program is recommended and has been proven to help reduce, reverse, and/or prevent the damaging effects of metabolic syndrome.  Nutrition   Overview of the Pritikin Eating Plan  Clinical staff conducted group or individual video education with verbal and written material and guidebook.  Patient learns about the Pritikin Eating Plan for disease risk reduction. The Pritikin Eating Plan emphasizes a wide variety of unrefined, minimally-processed carbohydrates, like fruits, vegetables, whole grains, and legumes. Go, Caution, and Stop food choices are explained. Plant-based and lean  animal proteins are emphasized. Rationale provided for low sodium intake for blood pressure control, low added sugars for blood sugar stabilization, and low added fats and oils for coronary artery disease risk reduction and weight management.  Calorie Density  Clinical staff conducted group or individual video education with verbal and written material and guidebook.  Patient learns about calorie density and how it impacts the Pritikin Eating Plan. Knowing the characteristics of the food you choose will help you decide whether those foods will lead to weight gain or weight loss, and whether you want to consume more or less of them. Weight loss is usually a side effect of the Pritikin Eating Plan because of its focus on low calorie-dense foods.  Label Reading  Clinical staff conducted group or individual video education with verbal and written material and guidebook.  Patient learns about the Pritikin recommended label reading guidelines and corresponding recommendations regarding calorie density, added sugars, sodium content, and whole grains.  Dining Out - Part 1  Clinical staff conducted group or individual video education with verbal and written material and guidebook.  Patient learns that restaurant meals can be sabotaging because they can be so high in calories, fat, sodium, and/or sugar. Patient learns recommended strategies on how to positively address this and avoid unhealthy pitfalls.  Facts on Fats  Clinical staff conducted group or individual video education with verbal and written material and guidebook.  Patient learns that lifestyle modifications can be just as effective, if not more so, as many medications for lowering your risk of heart disease. A Pritikin lifestyle can help to reduce your risk of inflammation and atherosclerosis (cholesterol build-up, or plaque, in the artery walls). Lifestyle interventions such as dietary choices and physical activity address the cause of  atherosclerosis. A review of the types of fats and their impact on blood cholesterol levels, along with dietary recommendations to reduce fat intake is also included.  Nutrition Action Plan  Clinical staff conducted group or individual video education with verbal and written material and guidebook.  Patient learns how to incorporate Pritikin recommendations into their lifestyle. Recommendations include  planning and keeping personal health goals in mind as an important part of their success.  Healthy Mind-Set    Healthy Minds, Bodies, Hearts  Clinical staff conducted group or individual video education with verbal and written material and guidebook.  Patient learns how to identify when they are stressed. Video will discuss the impact of that stress, as well as the many benefits of stress management. Patient will also be introduced to stress management techniques. The way we think, act, and feel has an impact on our hearts.  How Our Thoughts Can Heal Our Hearts  Clinical staff conducted group or individual video education with verbal and written material and guidebook.  Patient learns that negative thoughts can cause depression and anxiety. This can result in negative lifestyle behavior and serious health problems. Cognitive behavioral therapy is an effective method to help control our thoughts in order to change and improve our emotional outlook.  Additional Videos:  Exercise    Improving Performance  Clinical staff conducted group or individual video education with verbal and written material and guidebook.  Patient learns to use a non-linear approach by alternating intensity levels and lengths of time spent exercising to help burn more calories and lose more body fat. Cardiovascular exercise helps improve heart health, metabolism, hormonal balance, blood sugar control, and recovery from fatigue. Resistance training improves strength, endurance, balance, coordination, reaction time, metabolism,  and muscle mass. Flexibility exercise improves circulation, posture, and balance. Seek guidance from your physician and exercise physiologist before implementing an exercise routine and learn your capabilities and proper form for all exercise.  Introduction to Yoga  Clinical staff conducted group or individual video education with verbal and written material and guidebook.  Patient learns about yoga, a discipline of the coming together of mind, breath, and body. The benefits of yoga include improved flexibility, improved range of motion, better posture and core strength, increased lung function, weight loss, and positive self-image. Yoga's heart health benefits include lowered blood pressure, healthier heart rate, decreased cholesterol and triglyceride levels, improved immune function, and reduced stress. Seek guidance from your physician and exercise physiologist before implementing an exercise routine and learn your capabilities and proper form for all exercise.  Medical   Aging: Enhancing Your Quality of Life  Clinical staff conducted group or individual video education with verbal and written material and guidebook.  Patient learns key strategies and recommendations to stay in good physical health and enhance quality of life, such as prevention strategies, having an advocate, securing a Health Care Proxy and Power of Attorney, and keeping a list of medications and system for tracking them. It also discusses how to avoid risk for bone loss.  Biology of Weight Control  Clinical staff conducted group or individual video education with verbal and written material and guidebook.  Patient learns that weight gain occurs because we consume more calories than we burn (eating more, moving less). Even if your body weight is normal, you may have higher ratios of fat compared to muscle mass. Too much body fat puts you at increased risk for cardiovascular disease, heart attack, stroke, type 2 diabetes, and  obesity-related cancers. In addition to exercise, following the Pritikin Eating Plan can help reduce your risk.  Decoding Lab Results  Clinical staff conducted group or individual video education with verbal and written material and guidebook.  Patient learns that lab test reflects one measurement whose values change over time and are influenced by many factors, including medication, stress, sleep, exercise, food, hydration, pre-existing medical  conditions, and more. It is recommended to use the knowledge from this video to become more involved with your lab results and evaluate your numbers to speak with your doctor.   Diseases of Our Time - Overview  Clinical staff conducted group or individual video education with verbal and written material and guidebook.  Patient learns that according to the CDC, 50% to 70% of chronic diseases (such as obesity, type 2 diabetes, elevated lipids, hypertension, and heart disease) are avoidable through lifestyle improvements including healthier food choices, listening to satiety cues, and increased physical activity.  Sleep Disorders Clinical staff conducted group or individual video education with verbal and written material and guidebook.  Patient learns how good quality and duration of sleep are important to overall health and well-being. Patient also learns about sleep disorders and how they impact health along with recommendations to address them, including discussing with a physician.  Nutrition  Dining Out - Part 2 Clinical staff conducted group or individual video education with verbal and written material and guidebook.  Patient learns how to plan ahead and communicate in order to maximize their dining experience in a healthy and nutritious manner. Included are recommended food choices based on the type of restaurant the patient is visiting.   Fueling a Banker conducted group or individual video education with verbal and written  material and guidebook.  There is a strong connection between our food choices and our health. Diseases like obesity and type 2 diabetes are very prevalent and are in large-part due to lifestyle choices. The Pritikin Eating Plan provides plenty of food and hunger-curbing satisfaction. It is easy to follow, affordable, and helps reduce health risks.  Menu Workshop  Clinical staff conducted group or individual video education with verbal and written material and guidebook.  Patient learns that restaurant meals can sabotage health goals because they are often packed with calories, fat, sodium, and sugar. Recommendations include strategies to plan ahead and to communicate with the manager, chef, or server to help order a healthier meal.  Planning Your Eating Strategy  Clinical staff conducted group or individual video education with verbal and written material and guidebook.  Patient learns about the Pritikin Eating Plan and its benefit of reducing the risk of disease. The Pritikin Eating Plan does not focus on calories. Instead, it emphasizes high-quality, nutrient-rich foods. By knowing the characteristics of the foods, we choose, we can determine their calorie density and make informed decisions.  Targeting Your Nutrition Priorities  Clinical staff conducted group or individual video education with verbal and written material and guidebook.  Patient learns that lifestyle habits have a tremendous impact on disease risk and progression. This video provides eating and physical activity recommendations based on your personal health goals, such as reducing LDL cholesterol, losing weight, preventing or controlling type 2 diabetes, and reducing high blood pressure.  Vitamins and Minerals  Clinical staff conducted group or individual video education with verbal and written material and guidebook.  Patient learns different ways to obtain key vitamins and minerals, including through a recommended healthy diet.  It is important to discuss all supplements you take with your doctor.   Healthy Mind-Set    Smoking Cessation  Clinical staff conducted group or individual video education with verbal and written material and guidebook.  Patient learns that cigarette smoking and tobacco addiction pose a serious health risk which affects millions of people. Stopping smoking will significantly reduce the risk of heart disease, lung disease, and many forms  of cancer. Recommended strategies for quitting are covered, including working with your doctor to develop a successful plan.  Culinary   Becoming a Set Designer conducted group or individual video education with verbal and written material and guidebook.  Patient learns that cooking at home can be healthy, cost-effective, quick, and puts them in control. Keys to cooking healthy recipes will include looking at your recipe, assessing your equipment needs, planning ahead, making it simple, choosing cost-effective seasonal ingredients, and limiting the use of added fats, salts, and sugars.  Cooking - Breakfast and Snacks  Clinical staff conducted group or individual video education with verbal and written material and guidebook.  Patient learns how important breakfast is to satiety and nutrition through the entire day. Recommendations include key foods to eat during breakfast to help stabilize blood sugar levels and to prevent overeating at meals later in the day. Planning ahead is also a key component.  Cooking - Educational Psychologist conducted group or individual video education with verbal and written material and guidebook.  Patient learns eating strategies to improve overall health, including an approach to cook more at home. Recommendations include thinking of animal protein as a side on your plate rather than center stage and focusing instead on lower calorie dense options like vegetables, fruits, whole grains, and plant-based  proteins, such as beans. Making sauces in large quantities to freeze for later and leaving the skin on your vegetables are also recommended to maximize your experience.  Cooking - Healthy Salads and Dressing Clinical staff conducted group or individual video education with verbal and written material and guidebook.  Patient learns that vegetables, fruits, whole grains, and legumes are the foundations of the Pritikin Eating Plan. Recommendations include how to incorporate each of these in flavorful and healthy salads, and how to create homemade salad dressings. Proper handling of ingredients is also covered. Cooking - Soups and [employer] - Soups and Desserts Clinical staff conducted group or individual video education with verbal and written material and guidebook.  Patient learns that Pritikin soups and desserts make for easy, nutritious, and delicious snacks and meal components that are low in sodium, fat, sugar, and calorie density, while high in vitamins, minerals, and filling fiber. Recommendations include simple and healthy ideas for soups and desserts.   Overview     The Pritikin Solution Program Overview Clinical staff conducted group or individual video education with verbal and written material and guidebook.  Patient learns that the results of the Pritikin Program have been documented in more than 100 articles published in peer-reviewed journals, and the benefits include reducing risk factors for (and, in some cases, even reversing) high cholesterol, high blood pressure, type 2 diabetes, obesity, and more! An overview of the three key pillars of the Pritikin Program will be covered: eating well, doing regular exercise, and having a healthy mind-set.  WORKSHOPS  Exercise: Exercise Basics: Building Your Action Plan Clinical staff led group instruction and group discussion with PowerPoint presentation and patient guidebook. To enhance the learning environment the use of posters,  models and videos may be added. At the conclusion of this workshop, patients will comprehend the difference between physical activity and exercise, as well as the benefits of incorporating both, into their routine. Patients will understand the FITT (Frequency, Intensity, Time, and Type) principle and how to use it to build an exercise action plan. In addition, safety concerns and other considerations for exercise and cardiac rehab will be addressed  by the presenter. The purpose of this lesson is to promote a comprehensive and effective weekly exercise routine in order to improve patients' overall level of fitness.   Managing Heart Disease: Your Path to a Healthier Heart Clinical staff led group instruction and group discussion with PowerPoint presentation and patient guidebook. To enhance the learning environment the use of posters, models and videos may be added.At the conclusion of this workshop, patients will understand the anatomy and physiology of the heart. Additionally, they will understand how Pritikin's three pillars impact the risk factors, the progression, and the management of heart disease.  The purpose of this lesson is to provide a high-level overview of the heart, heart disease, and how the Pritikin lifestyle positively impacts risk factors.  Exercise Biomechanics Clinical staff led group instruction and group discussion with PowerPoint presentation and patient guidebook. To enhance the learning environment the use of posters, models and videos may be added. Patients will learn how the structural parts of their bodies function and how these functions impact their daily activities, movement, and exercise. Patients will learn how to promote a neutral spine, learn how to manage pain, and identify ways to improve their physical movement in order to promote healthy living. The purpose of this lesson is to expose patients to common physical limitations that impact physical activity.  Participants will learn practical ways to adapt and manage aches and pains, and to minimize their effect on regular exercise. Patients will learn how to maintain good posture while sitting, walking, and lifting.  Balance Training and Fall Prevention  Clinical staff led group instruction and group discussion with PowerPoint presentation and patient guidebook. To enhance the learning environment the use of posters, models and videos may be added. At the conclusion of this workshop, patients will understand the importance of their sensorimotor skills (vision, proprioception, and the vestibular system) in maintaining their ability to balance as they age. Patients will apply a variety of balancing exercises that are appropriate for their current level of function. Patients will understand the common causes for poor balance, possible solutions to these problems, and ways to modify their physical environment in order to minimize their fall risk. The purpose of this lesson is to teach patients about the importance of maintaining balance as they age and ways to minimize their risk of falling.  WORKSHOPS   Nutrition:  Fueling a Ship Broker led group instruction and group discussion with PowerPoint presentation and patient guidebook. To enhance the learning environment the use of posters, models and videos may be added. Patients will review the foundational principles of the Pritikin Eating Plan and understand what constitutes a serving size in each of the food groups. Patients will also learn Pritikin-friendly foods that are better choices when away from home and review make-ahead meal and snack options. Calorie density will be reviewed and applied to three nutrition priorities: weight maintenance, weight loss, and weight gain. The purpose of this lesson is to reinforce (in a group setting) the key concepts around what patients are recommended to eat and how to apply these guidelines when away  from home by planning and selecting Pritikin-friendly options. Patients will understand how calorie density may be adjusted for different weight management goals.  Mindful Eating  Clinical staff led group instruction and group discussion with PowerPoint presentation and patient guidebook. To enhance the learning environment the use of posters, models and videos may be added. Patients will briefly review the concepts of the Pritikin Eating Plan and the importance  of low-calorie dense foods. The concept of mindful eating will be introduced as well as the importance of paying attention to internal hunger signals. Triggers for non-hunger eating and techniques for dealing with triggers will be explored. The purpose of this lesson is to provide patients with the opportunity to review the basic principles of the Pritikin Eating Plan, discuss the value of eating mindfully and how to measure internal cues of hunger and fullness using the Hunger Scale. Patients will also discuss reasons for non-hunger eating and learn strategies to use for controlling emotional eating.  Targeting Your Nutrition Priorities Clinical staff led group instruction and group discussion with PowerPoint presentation and patient guidebook. To enhance the learning environment the use of posters, models and videos may be added. Patients will learn how to determine their genetic susceptibility to disease by reviewing their family history. Patients will gain insight into the importance of diet as part of an overall healthy lifestyle in mitigating the impact of genetics and other environmental insults. The purpose of this lesson is to provide patients with the opportunity to assess their personal nutrition priorities by looking at their family history, their own health history and current risk factors. Patients will also be able to discuss ways of prioritizing and modifying the Pritikin Eating Plan for their highest risk areas  Menu  Clinical  staff led group instruction and group discussion with PowerPoint presentation and patient guidebook. To enhance the learning environment the use of posters, models and videos may be added. Using menus brought in from [employer], or printed from [employer], patients will apply the Pritikin dining out guidelines that were presented in the [employer] video. Patients will also be able to practice these guidelines in a variety of provided scenarios. The purpose of this lesson is to provide patients with the opportunity to practice hands-on learning of the Pritikin Dining Out guidelines with actual menus and practice scenarios.  Label Reading Clinical staff led group instruction and group discussion with PowerPoint presentation and patient guidebook. To enhance the learning environment the use of posters, models and videos may be added. Patients will review and discuss the Pritikin label reading guidelines presented in [employer]ritikin's Label Reading Educational series video. Using fool labels brought in from local grocery stores and markets, patients will apply the label reading guidelines and determine if the packaged food meet the Pritikin guidelines. The purpose of this lesson is to provide patients with the opportunity to review, discuss, and practice hands-on learning of the Pritikin Label Reading guidelines with actual packaged food labels. Cooking School  Pritikin's [employer] are designed to teach patients ways to prepare quick, simple, and affordable recipes at home. The importance of nutrition's role in chronic disease risk reduction is reflected in its emphasis in the overall Pritikin program. By learning how to prepare essential core Pritikin Eating Plan recipes, patients will increase control over what they eat; be able to customize the flavor of foods without the use of added salt, sugar, or fat; and improve the quality of the food they consume. By learning a set of  core recipes which are easily assembled, quickly prepared, and affordable, patients are more likely to prepare more healthy foods at home. These workshops focus on convenient breakfasts, simple entres, side dishes, and desserts which can be prepared with minimal effort and are consistent with nutrition recommendations for cardiovascular risk reduction. Cooking [employer] are taught by a armed forces logistics/support/administrative officer (RD) who has been trained by the  Pritikin Designer, Industrial/product. The chef or RD has a clear understanding of the importance of minimizing - if not completely eliminating - added fat, sugar, and sodium in recipes. Throughout the series of Cooking School Workshop sessions, patients will learn about healthy ingredients and efficient methods of cooking to build confidence in their capability to prepare    Cooking School weekly topics:  Adding Flavor- Sodium-Free  Fast and Healthy Breakfasts  Powerhouse Plant-Based Proteins  Satisfying Salads and Dressings  Simple Sides and Sauces  International Cuisine-Spotlight on the [employer] Zones  Delicious Desserts  Savory Soups  [employer] - Meals in a Astronomer Appetizers and Snacks  Comforting Weekend Breakfasts  One-Pot Wonders   Fast Evening Meals  Landscape Architect Your Pritikin Plate  WORKSHOPS   Healthy Mindset (Psychosocial):  Focused Goals, Sustainable Changes Clinical staff led group instruction and group discussion with PowerPoint presentation and patient guidebook. To enhance the learning environment the use of posters, models and videos may be added. Patients will be able to apply effective goal setting strategies to establish at least one personal goal, and then take consistent, meaningful action toward that goal. They will learn to identify common barriers to achieving personal goals and develop strategies to overcome them. Patients will also gain an understanding of how our mind-set can impact our ability  to achieve goals and the importance of cultivating a positive and growth-oriented mind-set. The purpose of this lesson is to provide patients with a deeper understanding of how to set and achieve personal goals, as well as the tools and strategies needed to overcome common obstacles which may arise along the way.  From Head to Heart: The Power of a Healthy Outlook  Clinical staff led group instruction and group discussion with PowerPoint presentation and patient guidebook. To enhance the learning environment the use of posters, models and videos may be added. Patients will be able to recognize and describe the impact of emotions and mood on physical health. They will discover the importance of self-care and explore self-care practices which may work for them. Patients will also learn how to utilize the 4 C's to cultivate a healthier outlook and better manage stress and challenges. The purpose of this lesson is to demonstrate to patients how a healthy outlook is an essential part of maintaining good health, especially as they continue their cardiac rehab journey.  Healthy Sleep for a Healthy Heart Clinical staff led group instruction and group discussion with PowerPoint presentation and patient guidebook. To enhance the learning environment the use of posters, models and videos may be added. At the conclusion of this workshop, patients will be able to demonstrate knowledge of the importance of sleep to overall health, well-being, and quality of life. They will understand the symptoms of, and treatments for, common sleep disorders. Patients will also be able to identify daytime and nighttime behaviors which impact sleep, and they will be able to apply these tools to help manage sleep-related challenges. The purpose of this lesson is to provide patients with a general overview of sleep and outline the importance of quality sleep. Patients will learn about a few of the most common sleep disorders. Patients will  also be introduced to the concept of "sleep hygiene," and discover ways to self-manage certain sleeping problems through simple daily behavior changes. Finally, the workshop will motivate patients by clarifying the links between quality sleep and their goals of heart-healthy living.   Recognizing and Reducing Stress Clinical staff led group instruction  and group discussion with PowerPoint presentation and patient guidebook. To enhance the learning environment the use of posters, models and videos may be added. At the conclusion of this workshop, patients will be able to understand the types of stress reactions, differentiate between acute and chronic stress, and recognize the impact that chronic stress has on their health. They will also be able to apply different coping mechanisms, such as reframing negative self-talk. Patients will have the opportunity to practice a variety of stress management techniques, such as deep abdominal breathing, progressive muscle relaxation, and/or guided imagery.  The purpose of this lesson is to educate patients on the role of stress in their lives and to provide healthy techniques for coping with it.  Learning Barriers/Preferences:  Learning Barriers/Preferences - 10/22/23 1112       Learning Barriers/Preferences   Learning Barriers Sight;Hearing    Learning Preferences Computer/Internet;Group Instruction;Individual Instruction;Skilled Demonstration;Pictoral;Written Material             Education Topics:  Knowledge Questionnaire Score:  Knowledge Questionnaire Score - 10/22/23 1113       Knowledge Questionnaire Score   Pre Score 19/24             Core Components/Risk Factors/Patient Goals at Admission:  Personal Goals and Risk Factors at Admission - 10/22/23 1114       Core Components/Risk Factors/Patient Goals on Admission    Weight Management Yes;Weight Loss    Intervention Weight Management: Develop a combined nutrition and exercise program  designed to reach desired caloric intake, while maintaining appropriate intake of nutrient and fiber, sodium and fats, and appropriate energy expenditure required for the weight goal.;Weight Management: Provide education and appropriate resources to help participant work on and attain dietary goals.    Expected Outcomes Short Term: Continue to assess and modify interventions until short term weight is achieved;Long Term: Adherence to nutrition and physical activity/exercise program aimed toward attainment of established weight goal;Weight Loss: Understanding of general recommendations for a balanced deficit meal plan, which promotes 1-2 lb weight loss per week and includes a negative energy balance of [phone] kcal/d;Understanding of distribution of calorie intake throughout the day with the consumption of 4-5 meals/snacks;Understanding recommendations for meals to include 15-35% energy as protein, 25-35% energy from fat, 35-60% energy from carbohydrates, less than 200mg  of dietary cholesterol, 20-35 gm of total fiber daily    Diabetes Yes    Intervention Provide education about signs/symptoms and action to take for hypo/hyperglycemia.;Provide education about proper nutrition, including hydration, and aerobic/resistive exercise prescription along with prescribed medications to achieve blood glucose in normal ranges: Fasting glucose 65-99 mg/dL    Expected Outcomes Short Term: Participant verbalizes understanding of the signs/symptoms and immediate care of hyper/hypoglycemia, proper foot care and importance of medication, aerobic/resistive exercise and nutrition plan for blood glucose control.;Long Term: Attainment of HbA1C < 7%.    Hypertension Yes    Intervention Provide education on lifestyle modifcations including regular physical activity/exercise, weight management, moderate sodium restriction and increased consumption of fresh fruit, vegetables, and low fat dairy, alcohol moderation, and smoking  cessation.;Monitor prescription use compliance.    Expected Outcomes Short Term: Continued assessment and intervention until BP is < 140/44mm HG in hypertensive participants. < 130/66mm HG in hypertensive participants with diabetes, heart failure or chronic kidney disease.;Long Term: Maintenance of blood pressure at goal levels.    Lipids Yes    Intervention Provide education and support for participant on nutrition & aerobic/resistive exercise along with prescribed medications to achieve LDL 70mg ,  HDL >40mg .    Expected Outcomes Short Term: Participant states understanding of desired cholesterol values and is compliant with medications prescribed. Participant is following exercise prescription and nutrition guidelines.;Long Term: Cholesterol controlled with medications as prescribed, with individualized exercise RX and with personalized nutrition plan. Value goals: LDL < 70mg , HDL > 40 mg.             Core Components/Risk Factors/Patient Goals Review:   Goals and Risk Factor Review     Row Name 10/24/23 1701 11/19/23 1635 12/16/23 0835         Core Components/Risk Factors/Patient Goals Review   Personal Goals Review Weight Management/Obesity;Hypertension;Lipids;Diabetes Weight Management/Obesity;Hypertension;Lipids;Diabetes Weight Management/Obesity;Hypertension;Lipids;Diabetes     Review Fred [name] started cardiac rehab on 10/24/23. Fred [name] did well with exercise. Vital signs were stable. Fred [name] has been doing well with exercise. Vital signs have been  stable Since blood pressure medications have been adjusted. Fred [name] has lost 1 kg since starting cardiac rehab. Fred [name] continues to do  well with exercise. Vital signs and CBG's  remain stable. Fred [name] has lost 1 kg since starting cardiac rehab.     Expected Outcomes Fred [name] will continue to participate in cardiac rehab for exercise, nutrition and lifestyle modifications Fred [name] will continue to participate in cardiac rehab for exercise, nutrition and  lifestyle modifications Fred [name] will continue to participate in cardiac rehab for exercise, nutrition and lifestyle modifications              Core Components/Risk Factors/Patient Goals at Discharge (Final Review):   Goals and Risk Factor Review - 12/16/23 0835       Core Components/Risk Factors/Patient Goals Review   Personal Goals Review Weight Management/Obesity;Hypertension;Lipids;Diabetes    Review Fred [name] continues to do  well with exercise. Vital signs and CBG's  remain stable. Fred [name] has lost 1 kg since starting cardiac rehab.    Expected Outcomes Fred [name] will continue to participate in cardiac rehab for exercise, nutrition and lifestyle modifications             ITP Comments:  ITP Comments     Row Name 10/22/23 1039 10/24/23 1700 11/19/23 1632 12/16/23 0827     ITP Comments Dr. Wilbert Bihari medical director. Introduction to pritikin education/ intensive cardiac rehab. Initial orientation packet reviewed with patient. 30 Day ITP Review. Jaequan started cardiac rehab on 10/24/23 and did well with exercise. 30 Day ITP Review. Zohair  has good attendance and participation in cardiac rehab 30 Day ITP Review. Gianlucas continues to have good attendance and participation in cardiac rehab             Comments: See ITP comments.Hadassah Elpidio Quan RN BSN

## 2023-12-17 ENCOUNTER — Encounter: Payer: Self-pay | Admitting: Internal Medicine

## 2023-12-17 ENCOUNTER — Encounter (HOSPITAL_COMMUNITY)
Admission: RE | Admit: 2023-12-17 | Discharge: 2023-12-17 | Disposition: A | Discharge status: Home / Self Care | Payer: Medicare Other | Source: Ambulatory Visit | Attending: Cardiovascular Disease | Admitting: Cardiovascular Disease

## 2023-12-17 ENCOUNTER — Ambulatory Visit: Payer: Medicare Other | Admitting: Internal Medicine

## 2023-12-17 VITALS — BP 120/78 | HR 78 | Ht 67.0 in | Wt 182.0 lb

## 2023-12-17 DIAGNOSIS — Z955 Presence of coronary angioplasty implant and graft: Secondary | ICD-10-CM | POA: Diagnosis not present

## 2023-12-17 DIAGNOSIS — Z794 Long term (current) use of insulin: Secondary | ICD-10-CM | POA: Diagnosis not present

## 2023-12-17 DIAGNOSIS — E1159 Type 2 diabetes mellitus with other circulatory complications: Secondary | ICD-10-CM

## 2023-12-17 DIAGNOSIS — E1165 Type 2 diabetes mellitus with hyperglycemia: Secondary | ICD-10-CM | POA: Diagnosis not present

## 2023-12-17 LAB — POCT GLYCOSYLATED HEMOGLOBIN (HGB A1C): Hemoglobin A1C: 7.2 % — AB (ref 4.0–5.6)

## 2023-12-17 MED ORDER — TOUJEO MAX SOLOSTAR 300 UNIT/ML ~~LOC~~ SOPN: 40.0000 [IU] | PEN_INJECTOR | Freq: Every day | SUBCUTANEOUS | 6 refills | Status: DC

## 2023-12-17 MED ORDER — LEVOTHYROXINE SODIUM 25 MCG PO TABS: 25.0000 ug | ORAL_TABLET | Freq: Every day | ORAL | 3 refills | Status: DC

## 2023-12-17 MED ORDER — METFORMIN HCL 1000 MG PO TABS: 1000.0000 mg | ORAL_TABLET | Freq: Two times a day (BID) | ORAL | 3 refills | Status: DC

## 2023-12-17 MED ORDER — INSULIN PEN NEEDLE 32G X 4 MM MISC: 1.0000 | Freq: Every day | 3 refills | Status: DC

## 2023-12-17 NOTE — Progress Notes (Signed)
 Reviewed home exercise Rx with patient today.  Encouraged warm-up, cool-down, and stretching. Reviewed THRR of  and keeping RPE between 11-13. Encouraged to hydrate with activity.  Reviewed weather parameters for temperature and humidity for safe exercise outdoors. Reviewed S/S to terminate exercise and when to call 911 vs MD. Reviewed the use of NTG and pt was encouraged to carry at all times. Pt encouraged to check blood sugar level before beginning exercise. Pt also encouraged to carry glucose tabs. Pt encouraged to always carry a cell phone for safety when exercising outdoors. Pt verbalized understanding of the home exercise Rx and was provided a copy.  Alm Parkins MS, ACSM-CEP, CCRP

## 2023-12-17 NOTE — Progress Notes (Signed)
 Name: Fred [name]  Age/ Sex: 83 y.o., male   MRN/ DOB: [id number], 11/27/1941     PCP: Arloa Elsie SAUNDERS, MD   Reason for Endocrinology Evaluation: Type 2 Diabetes Mellitus  Initial Endocrine Consultative Visit: 03/21/2020    PATIENT IDENTIFIER: [name] is a 83 y.o. male with a past medical history of  HTN, T2DM,CAD and Dyslipidemia. The patient has followed with Endocrinology [name] since 03/21/2020 for consultative assistance with management of his diabetes.  DIABETIC HISTORY:  Fred [name] was diagnosed with DM many years ago, he was initially on oral glycemic agents, but insulin  had to be added due to  persistent hyperglycemiaand. His hemoglobin A1c has ranged from 7.8% in 2011, peaking at 9.0% in 2020.  Started  Trulicity  01/2022  Switch to Ozempic  05/2023 due to shortage of supply  Thyroid  History :    He was noted to have an elevated TSH at 10.42 uIU/mL . He was started on LT-4 replacement in 02/2020.   SUBJECTIVE:   During the last visit (06/05/2023): A1c 7.3 %     Today (12/17/2023): Fred [name] is here for a follow up on diabetes and hypothyroidism.   He checks his blood sugars multiple times daily through  dexcom. The patient has had hypoglycemic episodes since the last [name] visit.He is asymptomatic    Since his last visit here he has required PCI/DES placement 09/2023 He is undergoing cardiac rehab  Denies nausea, vomiting Has occasional constipation , on colace   HOME DIABETES REGIMEN:  Farxiga  10 mg daily with Breakfast - through pt assistance  Toujeo  40 units daily  Metformin  1000 mg twice daily Ozempic  1 mg weekly - pt assistance       Statin: Yes ACE-I/ARB: no   CONTINUOUS GLUCOSE MONITORING RECORD INTERPRETATION    Dates of Recording: 12/04/2023-12/17/2023  Sensor description:dexcom  Results statistics:   CGM use % of time 43  Average and SD 174/46  Time in range 58 %  % Time Above 180 36  % Time above 250 6  % Time Below  target 0   Glycemic patterns summary: BG's optimal overnight and fluctuate during the day  Hyperglycemic episodes  postprandial   Hypoglycemic episodes N/A Overnight periods: optimal     DIABETIC COMPLICATIONS: Microvascular complications:   Mild tingling of feet Denies: CKD, retinopathy Last eye exam: Completed 2023     Macrovascular complications:  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), PCI/DES 2024 Denies: PVD, CVA  HISTORY:  Past Medical History:  Past Medical History:  Diagnosis Date   Coronary artery disease    Diabetes mellitus without complication (HCC)    High cholesterol    Vertigo    Past Surgical History:  Past Surgical History:  Procedure Laterality Date   CARDIAC CATHETERIZATION     CORONARY ARTERY BYPASS GRAFT N/A 10/12/2019   Procedure: CORONARY ARTERY BYPASS GRAFTING (CABG) x 4 (LIMA to LAD, SVG to OM, SVG to RAMUS INTERMEDIATE, SVG to PDA) with EVH from RIGHT GREATER SAPHENOUS VEIN and LEFT INTERNAL MAMMARY ARTERY HARVEST;  Surgeon: Fleeta Hanford Coy, MD;  Location: [facility] OR;  Service: Open Heart Surgery;  Laterality: N/A;   CORONARY STENT INTERVENTION N/A 09/18/2023   Procedure: CORONARY STENT INTERVENTION;  Surgeon: Elmira Newman PARAS, MD;  Location: [facility] INVASIVE CV LAB;  Service: Cardiovascular;  Laterality: N/A;   FOOT SURGERY     KNEE SURGERY     LEFT HEART CATH AND CORONARY ANGIOGRAPHY N/A 10/11/2019   Procedure: LEFT HEART CATH  AND CORONARY ANGIOGRAPHY;  Surgeon: Court Dorn PARAS, MD;  Location: [facility] INVASIVE CV LAB;  Service: Cardiovascular;  Laterality: N/A;   LEFT HEART CATH AND CORS/GRAFTS ANGIOGRAPHY N/A 09/18/2023   Procedure: LEFT HEART CATH AND CORS/GRAFTS ANGIOGRAPHY;  Surgeon: Elmira Newman PARAS, MD;  Location: [facility] INVASIVE CV LAB;  Service: Cardiovascular;  Laterality: N/A;   NASAL SEPTUM SURGERY     TEE WITHOUT CARDIOVERSION N/A 10/12/2019   Procedure: TRANSESOPHAGEAL ECHOCARDIOGRAM (TEE);  Surgeon: Fleeta Ochoa, Maude, MD;  Location: [facility] OR;  Service:  Open Heart Surgery;  Laterality: N/A;   Social History:  reports that he quit smoking about 61 years ago. His smoking use included cigarettes. He started smoking about 71 years ago. He has never used smokeless tobacco. He reports that he does not drink alcohol and does not use drugs. Family History: No family history on file.   HOME MEDICATIONS: Allergies as of 12/17/2023   No Known Allergies      Medication List        Accurate as of December 17, 2023  8:47 AM. If you have any questions, ask your nurse or doctor.          Accu-Chek Aviva device by Other route. Use as instructed 1 time daily   Accu-Chek FastClix Lancets Misc USE 1 TO 4 TIMES DAILY AS  NEEDED/DIRECTED   Accu-Chek Guide test strip Generic drug: glucose blood USE TO CHECK BLOOD SUGAR DAILY  AS DIRECTED   acetaminophen  650 MG CR tablet Commonly known as: TYLENOL  Take 1,300 mg by mouth 2 (two) times daily.   aspirin  EC 81 MG tablet Take 1 tablet (81 mg total) by mouth daily.   clopidogrel  75 MG tablet Commonly known as: PLAVIX  Take 1 tablet (75 mg total) by mouth daily with breakfast.   clotrimazole 1 % cream Commonly known as: LOTRIMIN Apply 1 application  topically daily as needed (Groin).   dapagliflozin  propanediol 10 MG Tabs tablet Commonly known as: Farxiga  Take 1 tablet (10 mg total) by mouth daily before breakfast.   Dexcom G6 Sensor Misc by Does not apply route.   Insulin  Pen Needle 32G X 4 MM Misc 1 Device by Does not apply route daily.   levothyroxine  25 MCG tablet Commonly known as: SYNTHROID  Take 1 tablet (25 mcg total) by mouth daily before breakfast.   metFORMIN  1000 MG tablet Commonly known as: GLUCOPHAGE  Take 1 tablet (1,000 mg total) by mouth 2 (two) times daily with a meal.   multivitamin with minerals tablet Take 1 tablet by mouth daily.   nitroGLYCERIN  0.4 MG SL tablet Commonly known as: NITROSTAT  Place 1 tablet (0.4 mg total) under the tongue every 5 (five) minutes as  needed for chest pain.   omega-3 acid ethyl esters 1 g capsule Commonly known as: Lovaza  Take 1 capsule (1 g total) by mouth 2 (two) times daily.   rosuvastatin  20 MG tablet Commonly known as: Crestor  Take 1 tablet (20 mg total) by mouth daily.   Semaglutide  (1 MG/DOSE) 4 MG/3ML Sopn Inject 1 mg as directed once a week.   solifenacin 10 MG tablet Commonly known as: VESICARE Take 10 mg by mouth daily.   tamsulosin 0.4 MG Caps capsule Commonly known as: FLOMAX Take 0.4 mg by mouth daily.   terbinafine 250 MG tablet Commonly known as: LAMISIL Take 250 mg by mouth daily.   Toujeo  Max SoloStar 300 UNIT/ML Solostar Pen Generic drug: insulin  glargine (2 Unit Dial) Inject 40 Units into the skin daily.  OBJECTIVE:   Vital Signs: BP 120/78 (BP Location: Left Arm, Patient Position: Sitting, Cuff Size: Normal)   Pulse 78   Ht 5' 7 (1.702 m)   Wt 182 lb (82.6 kg)   SpO2 96%   BMI 28.51 kg/m   Wt Readings from Last 3 Encounters:  12/17/23 182 lb (82.6 kg)  12/15/23 186 lb (84.4 kg)  11/27/23 183 lb (83 kg)     Exam: General: Pt appears well and is in NAD  Lungs: Clear with good BS bilat with no rales, rhonchi, or wheezes  Heart: RRR   Extremities: trace pretibial edema.   Neuro: MS is good with appropriate affect, pt is alert and Ox3    DM foot exam: 12/17/2023 The skin of the feet is without sores or ulcerations, plantar callous formation noted The pedal pulses are 1+ on right and 1+ on left. The sensation is intact  to a screening 5.07, 10 gram monofilament bilaterally   DATA REVIEWED:  Latest Reference Range & Units 11/25/23 08:09  Alkaline Phosphatase 44 - 121 IU/L 49  Albumin  3.7 - 4.7 g/dL 4.5  AST 0 - 40 IU/L 28  ALT 0 - 44 IU/L 37  Total Protein 6.0 - 8.5 g/dL 6.8  Total Bilirubin 0.0 - 1.2 mg/dL 0.4  BILIRUBIN, DIRECT 0.00 - 0.40 mg/dL 9.86  Total CHOL/HDL Ratio 0.0 - 5.0 ratio 4.2  Cholesterol, Total 100 - 199 mg/dL 864  HDL Cholesterol >60  mg/dL 32 (L)  Triglycerides 0 - 149 mg/dL 727 (H)  VLDL Cholesterol Cal 5 - 40 mg/dL 43 (H)  LDL Chol Calc (NIH) 0 - 99 mg/dL 60    ASSESSMENT / PLAN / RECOMMENDATIONS:   1) Type 2 Diabetes Mellitus, OPtimally controlled, With Neuropathic and  Macrovascular  complications - Most recent A1c of 7.2 %. Goal A1c < 7.5 %.    -A1c continues to trend down and at goal -He is on patient assistance for Farxiga  and Ozempic  -No changes at this time  MEDICATIONS: Continue Farxiga  10 mg daily  Continue Toujeo  40 units daily  Continue Metformin  1000 mg BID  Continue Ozempic  1 mg weekly  EDUCATION / INSTRUCTIONS: BG monitoring instructions: Patient is instructed to check his blood sugars 2 times a day, fasting and bedtime . Call Fred [name] if: BG persistently < 70  I reviewed the Rule of 15 for the treatment of hypoglycemia in detail with the patient. Literature supplied.   2) Diabetic complications:  Eye: Does not have known diabetic retinopathy.  Neuro/ Feet: Does  have known diabetic peripheral neuropathy .  Renal: Patient does not have known baseline CKD. He   is not on an ACEI/ARB at present. MA/Cr ratio is normal 02/2022)  2) Hypothyroidism:  - TSH at goal in the past - No changes    Medication  Continue levothyroxine  25 mcg daily      4) Dyslipidemia/CAD:  - Per cardiology     F/U in 6 months     Signed electronically by: Stefano Redgie Butts, MD  [facility] Endocrinology  [facility] [address] [place] [place], [place] [address] Phone: [phone] FAX: [phone]   CC: Arloa Elsie SAUNDERS, MD [address] [place] [address] Phone: [phone]  Fax: [phone]  Return to Endocrinology [name] as below: Future Appointments  Date Time Provider Department Center  12/17/2023  8:50 AM Breonia Kirstein, Donell Redgie, MD [facility] None  12/17/2023 11:45 AM [facility] None  12/19/2023 11:45  AM [facility] PHASE II EXC [facility] None  12/22/2023 11:45 AM [facility] PH[facility] II [facility]C [facility] None  12/24/2023 11:45 AM [facility] PH[facility] II [facility] None  12/26/2023 11:45 AM [facility] PH[facility] II EXC [facility] None  12/29/2023 11:45 AM [facility] PH[facility] II EXC [facility] None  12/31/2023 11:45 AM [facility] PHASE II EXC [facility] None  01/02/2024 11:45 AM [facility] PHASE II EXC [facility] None  01/05/2024 11:45 AM [facility] PHASE II EXC [facility] None  01/07/2024 11:45 AM [facility] PHASE II EXC [facility] None  01/09/2024 11:45 AM [facility] PHASE II EXC [facility] None  01/12/2024 11:45 AM [facility] PH[facility] II EXC [facility] None  01/14/2024 11:45 AM [facility] PHASE II EXC [facility] None

## 2023-12-17 NOTE — Patient Instructions (Signed)
-   Continue Toujeo  40 units daily  - Continue Ozempic  1 mg weekly - Continue Farxiga  10 mg daily with Breakfast  - Continue Metformin  1000 mg twice daily    HOW TO TREAT LOW BLOOD SUGARS (Blood sugar LESS THAN 70 MG/DL) Please follow the RULE OF 15 for the treatment of hypoglycemia treatment (when your (blood sugars are less than 70 mg/dL)   STEP 1: Take 15 grams of carbohydrates when your blood sugar is low, which includes:  3-4 GLUCOSE TABS  OR 3-4 OZ OF JUICE OR REGULAR SODA OR ONE TUBE OF GLUCOSE GEL    STEP 2: RECHECK blood sugar in 15 MINUTES STEP 3: If your blood sugar is still low at the 15 minute recheck --> then, go back to STEP 1 and treat AGAIN with another 15 grams of carbohydrates. -

## 2023-12-19 ENCOUNTER — Telehealth (HOSPITAL_COMMUNITY): Payer: Self-pay

## 2023-12-19 ENCOUNTER — Encounter (HOSPITAL_COMMUNITY): Payer: Medicare Other

## 2023-12-19 NOTE — Telephone Encounter (Signed)
 Talked with patient, no classes offered after 12:30 due to impending weather.

## 2023-12-22 ENCOUNTER — Encounter (HOSPITAL_COMMUNITY)
Admission: RE | Admit: 2023-12-22 | Discharge: 2023-12-22 | Disposition: A | Discharge status: Home / Self Care | Payer: Medicare Other | Source: Ambulatory Visit | Attending: Cardiovascular Disease

## 2023-12-22 DIAGNOSIS — Z955 Presence of coronary angioplasty implant and graft: Secondary | ICD-10-CM | POA: Diagnosis not present

## 2023-12-24 ENCOUNTER — Encounter (HOSPITAL_COMMUNITY)
Admission: RE | Admit: 2023-12-24 | Discharge: 2023-12-24 | Disposition: A | Discharge status: Home / Self Care | Payer: Medicare Other | Source: Ambulatory Visit | Attending: Cardiovascular Disease

## 2023-12-24 DIAGNOSIS — Z955 Presence of coronary angioplasty implant and graft: Secondary | ICD-10-CM | POA: Diagnosis not present

## 2023-12-26 ENCOUNTER — Encounter (HOSPITAL_COMMUNITY): Payer: Medicare Other

## 2023-12-29 ENCOUNTER — Encounter (HOSPITAL_COMMUNITY)
Admission: RE | Admit: 2023-12-29 | Discharge: 2023-12-29 | Disposition: A | Discharge status: Home / Self Care | Payer: Medicare Other | Source: Ambulatory Visit | Attending: Cardiovascular Disease | Admitting: Cardiovascular Disease

## 2023-12-29 DIAGNOSIS — Z955 Presence of coronary angioplasty implant and graft: Secondary | ICD-10-CM

## 2023-12-31 ENCOUNTER — Encounter (HOSPITAL_COMMUNITY)
Admission: RE | Admit: 2023-12-31 | Discharge: 2023-12-31 | Disposition: A | Discharge status: Home / Self Care | Payer: Medicare Other | Source: Ambulatory Visit | Attending: Cardiovascular Disease | Admitting: Cardiovascular Disease

## 2023-12-31 DIAGNOSIS — Z955 Presence of coronary angioplasty implant and graft: Secondary | ICD-10-CM | POA: Diagnosis not present

## 2024-01-02 ENCOUNTER — Encounter (HOSPITAL_COMMUNITY)
Admission: RE | Admit: 2024-01-02 | Discharge: 2024-01-02 | Disposition: A | Discharge status: Home / Self Care | Payer: Medicare Other | Source: Ambulatory Visit | Attending: Cardiovascular Disease

## 2024-01-02 DIAGNOSIS — Z955 Presence of coronary angioplasty implant and graft: Secondary | ICD-10-CM

## 2024-01-05 ENCOUNTER — Encounter (HOSPITAL_COMMUNITY): Payer: Medicare Other

## 2024-01-05 ENCOUNTER — Telehealth (HOSPITAL_COMMUNITY): Payer: Self-pay

## 2024-01-07 ENCOUNTER — Encounter (HOSPITAL_COMMUNITY): Admission: RE | Admit: 2024-01-07 | Payer: Medicare Other | Source: Ambulatory Visit

## 2024-01-07 ENCOUNTER — Telehealth (HOSPITAL_COMMUNITY): Payer: Self-pay

## 2024-01-07 NOTE — Telephone Encounter (Signed)
Pt returned Onalee Hua phone call in regards to absence from [facility]. Pt stated he will be here Friday.

## 2024-01-09 ENCOUNTER — Encounter (HOSPITAL_COMMUNITY)
Admission: RE | Admit: 2024-01-09 | Discharge: 2024-01-09 | Disposition: A | Discharge status: Home / Self Care | Payer: Medicare Other | Source: Ambulatory Visit | Attending: Cardiovascular Disease | Admitting: Cardiovascular Disease

## 2024-01-09 VITALS — Ht 67.0 in | Wt 181.9 lb

## 2024-01-09 DIAGNOSIS — Z955 Presence of coronary angioplasty implant and graft: Secondary | ICD-10-CM

## 2024-01-09 NOTE — Progress Notes (Signed)
Cardiac Individual Treatment Plan  Patient Details  Name: Fred [name] MRN: [id number] Date of Birth: 07/20/1941 Referring Provider:   Flowsheet Row INTENSIVE CARDIAC REHAB ORIENT from 10/22/2023 in [facility]  Referring Provider Nanetta Batty, MD       Initial Encounter Date:  Flowsheet Row INTENSIVE CARDIAC REHAB ORIENT from 10/22/2023 in [facility]  Date 10/22/23       Visit Diagnosis: 09/18/23 DES SVG-OM  Patient's Home Medications on Admission:  Current Outpatient Medications:    Accu-Chek FastClix Lancets MISC, USE 1 TO 4 TIMES DAILY AS  NEEDED/DIRECTED, Disp: 408 each, Rfl: 3   ACCU-CHEK GUIDE test strip, USE TO CHECK BLOOD SUGAR DAILY  AS DIRECTED, Disp: 100 strip, Rfl: 10   acetaminophen (TYLENOL) 650 MG CR tablet, Take 1,300 mg by mouth 2 (two) times daily., Disp: , Rfl:    aspirin 81 MG tablet, Take 1 tablet (81 mg total) by mouth daily., Disp: , Rfl:    Blood Glucose Monitoring Suppl (ACCU-CHEK AVIVA) device, by Other route. Use as instructed 1 time daily, Disp: , Rfl:    clopidogrel (PLAVIX) 75 MG tablet, Take 1 tablet (75 mg total) by mouth daily with breakfast., Disp: 90 tablet, Rfl: 3   clotrimazole (LOTRIMIN) 1 % cream, Apply 1 application  topically daily as needed (Groin)., Disp: , Rfl:    Continuous Glucose Sensor (DEXCOM G6 SENSOR) MISC, by Does not apply route., Disp: , Rfl:    dapagliflozin propanediol (FARXIGA) 10 MG TABS tablet, Take 1 tablet (10 mg total) by mouth daily before breakfast., Disp: 90 tablet, Rfl: 3   Insulin Pen Needle 32G X 4 MM MISC, 1 Device by Does not apply route daily., Disp: 100 each, Rfl: 3   levothyroxine (SYNTHROID) 25 MCG tablet, Take 1 tablet (25 mcg total) by mouth daily before breakfast., Disp: 90 tablet, Rfl: 3   metFORMIN (GLUCOPHAGE) 1000 MG tablet, Take 1 tablet (1,000 mg total) by mouth 2 (two) times  daily with a meal., Disp: 180 tablet, Rfl: 3   Multiple Vitamins-Minerals (MULTIVITAMIN WITH MINERALS) tablet, Take 1 tablet by mouth daily., Disp: , Rfl:    nitroGLYCERIN (NITROSTAT) 0.4 MG SL tablet, Place 1 tablet (0.4 mg total) under the tongue every 5 (five) minutes as needed for chest pain., Disp: 25 tablet, Rfl: 3   omega-3 acid ethyl esters (LOVAZA) 1 g capsule, Take 1 capsule (1 g total) by mouth 2 (two) times daily., Disp: 60 capsule, Rfl: 11   rosuvastatin (CRESTOR) 20 MG tablet, Take 1 tablet (20 mg total) by mouth daily., Disp: 90 tablet, Rfl: 3   Semaglutide, 1 MG/DOSE, 4 MG/3ML SOPN, Inject 1 mg as directed once a week., Disp: 9 mL, Rfl: 3   solifenacin (VESICARE) 10 MG tablet, Take 10 mg by mouth daily., Disp: , Rfl:    tamsulosin (FLOMAX) 0.4 MG CAPS capsule, Take 0.4 mg by mouth daily., Disp: , Rfl:    terbinafine (LAMISIL) 250 MG tablet, Take 250 mg by mouth daily., Disp: , Rfl:    TOUJEO MAX SOLOSTAR 300 UNIT/ML Solostar Pen, Inject 40 Units into the skin daily., Disp: 15 mL, Rfl: 6  Past Medical History: Past Medical History:  Diagnosis Date   Coronary artery disease    Diabetes mellitus without complication (HCC)    High cholesterol    Vertigo     Tobacco Use: Social History   Tobacco Use  Smoking Status Former   Current packs/day: 0.00   Types: Cigarettes   Start date: 12/1952   Quit date: 12/1962   Years since quitting: 61.1  Smokeless Tobacco Never    Labs: Review Flowsheet  More data exists      Latest Ref Rng & Units 06/03/2022 12/04/2022 06/05/2023 11/25/2023 12/17/2023  Labs for ITP Cardiac and Pulmonary Rehab  Cholestrol 100 - 199 mg/dL - 528  - 413  -  LDL (calc) 0 - 99 mg/dL - - - 60  -  Direct LDL mg/dL - 24.4  - - -  HDL-C >01 mg/dL - 02.72  - 32  -  Trlycerides 0 - 149 mg/dL - 536.6  - 440  -  Hemoglobin A1c 4.0 - 5.6 % 8.1  8.5  7.3  - 7.2     Capillary Blood Glucose: Lab Results  Component Value Date   GLUCAP 146 (H) 10/24/2023    GLUCAP 122 (H) 10/24/2023   GLUCAP 159 (H) 10/22/2023   GLUCAP 105 (H) 09/18/2023   GLUCAP 137 (H) 09/18/2023     Exercise Target Goals: Exercise Program Goal: Individual exercise prescription set using results from initial 6 min walk test and THRR while considering  patient's activity barriers and safety.   Exercise Prescription Goal: Initial exercise prescription builds to 30-45 minutes a day of aerobic activity, 2-3 days per week.  Home exercise guidelines will be given to patient during program as part of exercise prescription that the participant will acknowledge.  Activity Barriers & Risk Stratification:  Activity Barriers & Cardiac Risk Stratification - 10/22/23 1108       Activity Barriers & Cardiac Risk Stratification   Activity Barriers Arthritis;Balance Concerns;Deconditioning;Joint Problems    Cardiac Risk Stratification High   <5 METs on            6 Minute Walk:  6 Minute Walk     Row Name 10/22/23 1214 01/09/24 1302       6 Minute Walk   Phase Initial Discharge    Distance 1350 feet 1530 feet    Distance % Change -- 13.33 %    Distance Feet Change -- 180 ft    Walk Time 6 minutes --    # of Rest Breaks 0 0    MPH 2.56 2.9    METS 1.89 2.5    RPE 11 12    Perceived Dyspnea  0 0    VO2 Peak 6.61 8.72    Symptoms No No    Resting HR 72 bpm 75 bpm    Resting BP 102/60 110/60    Resting Oxygen Saturation  98 % --    Exercise Oxygen Saturation  during 6 min walk 98 % --    Max Ex. HR 81 bpm 101 bpm    Max Ex. BP 114/62 124/62    2 Minute Post BP 100/54 --             Oxygen Initial Assessment:   Oxygen Re-Evaluation:   Oxygen Discharge (Final Oxygen Re-Evaluation):   Initial Exercise Prescription:  Initial Exercise Prescription - 10/22/23 1200       Date of Initial Exercise RX and Referring Provider   Date 10/22/23    Referring Provider Nanetta Batty, MD    Expected Discharge Date 01/14/24      NuStep   Level 1    SPM 70     Minutes 15    METs 2      Track  Laps 16    Minutes 15    METs 2      Prescription Details   Frequency (times per week) 3    Duration Progress to 30 minutes of continuous aerobic without signs/symptoms of physical distress      Intensity   THRR 40-80% of Max Heartrate 55-110    Ratings of Perceived Exertion 11-13    Perceived Dyspnea 0-4      Progression   Progression Continue to progress workloads to maintain intensity without signs/symptoms of physical distress.      Resistance Training   Training Prescription Yes    Weight 3    Reps 10-15             Perform Capillary Blood Glucose checks as needed.  Exercise Prescription Changes:   Exercise Prescription Changes     Row Name 10/24/23 1253 11/14/23 1400 11/28/23 1500 12/12/23 1600 12/17/23 1500     Response to Exercise   Blood Pressure (Admit) 108/72 114/80 108/78 102/64 108/56   Blood Pressure (Exercise) 112/58 112/52 -- -- --   Blood Pressure (Exit) 114/70 102/70 120/64 104/68 93/55   Heart Rate (Admit) 69 bpm 77 bpm 75 bpm 72 bpm 78 bpm   Heart Rate (Exercise) 91 bpm 94 bpm 102 bpm 115 bpm 117 bpm   Heart Rate (Exit) 77 bpm 82 bpm 80 bpm 88 bpm 85 bpm   Rating of Perceived Exertion (Exercise) 11 11 12 11 11    Symptoms None None None None None   Comments Off to a good start with exercise. Reviewed METs Reviewed METs and goals Reviewed METs Reviewed HERx   Duration Continue with 30 min of aerobic exercise without signs/symptoms of physical distress. Continue with 30 min of aerobic exercise without signs/symptoms of physical distress. Continue with 30 min of aerobic exercise without signs/symptoms of physical distress. Continue with 30 min of aerobic exercise without signs/symptoms of physical distress. Continue with 30 min of aerobic exercise without signs/symptoms of physical distress.   Intensity THRR unchanged THRR unchanged THRR unchanged THRR unchanged THRR unchanged     Progression   Progression  Continue to progress workloads to maintain intensity without signs/symptoms of physical distress. Continue to progress workloads to maintain intensity without signs/symptoms of physical distress. Continue to progress workloads to maintain intensity without signs/symptoms of physical distress. Continue to progress workloads to maintain intensity without signs/symptoms of physical distress. Continue to progress workloads to maintain intensity without signs/symptoms of physical distress.   Average METs 2.4 2.9 3.15 3.2 3     Resistance Training   Training Prescription Yes Yes Yes Yes No   Weight 3 lbs 3 lbs 3 lbs 4 lbs No weights on Wednesdays   Reps 10-15 10-15 10-15 10-15 --   Time 10 Minutes 10 Minutes 10 Minutes 10 Minutes --     Interval Training   Interval Training No No No No No     NuStep   Level 1 2 3 3 3    SPM 83 100 110 133 119   Minutes 15 15 15 15 15    METs 2 2.6 2.9 3.1 2.6     Track   Laps 15 17 17 18 18    Minutes 15 15 15 15 15    METs 2.91 3.17 3.17 3.3 3.3     Home Exercise Plan   Plans to continue exercise at -- -- -- -- Home (comment)   Frequency -- -- -- -- Add 2 additional days to program exercise sessions.  Initial Home Exercises Provided -- -- -- -- 12/17/23    Row Name 12/31/23 1500             Response to Exercise   Blood Pressure (Admit) 120/56       Blood Pressure (Exit) 96/58       Heart Rate (Admit) 75 bpm       Heart Rate (Exercise) 95 bpm       Heart Rate (Exit) 79 bpm       Rating of Perceived Exertion (Exercise) 11       Symptoms None       Comments Reviewed METs and goals       Duration Continue with 30 min of aerobic exercise without signs/symptoms of physical distress.       Intensity THRR unchanged         Progression   Progression Continue to progress workloads to maintain intensity without signs/symptoms of physical distress.       Average METs 2.8         Resistance Training   Training Prescription No       Weight No weights on  Wednesdays         Interval Training   Interval Training No         NuStep   Level 4       SPM 105       Minutes 15       METs 2.5         Track   Laps 17       Minutes 15       METs 3.17         Home Exercise Plan   Plans to continue exercise at Home (comment)       Frequency Add 2 additional days to program exercise sessions.       Initial Home Exercises Provided 12/17/23                Exercise Comments:   Exercise Comments     Row Name 10/24/23 1347 11/14/23 1544 11/28/23 1500 12/12/23 1603 12/17/23 1532   Exercise Comments Kwamaine tolerated low intensity exercise well without symptoms. Reviewed METs with patient today. Increased workload on nustep today. Pt is making progress. Reviewed METs and goals. Will consider workload increase on nustep soon. Reviewed METs today. METs are at plateau, encouraged increase. Reviewed home exercise Rx today. Pt will walk at home 2x/week for 30 minutes. Pt verbalized understanding of the home exercise Rx and was provided a copy.    Row Name 12/31/23 1500           Exercise Comments Reviewed METs and goals. Pt voices feeling tired today and he attributes this to the very cold weather we have currently. Encouraged patient; explained that he will have days like this and the important thing is to do some activity, even at a reduced level.                Exercise Goals and Review:   Exercise Goals     Row Name 10/22/23 1111             Exercise Goals   Increase Physical Activity Yes       Intervention Provide advice, education, support and counseling about physical activity/exercise needs.;Develop an individualized exercise prescription for aerobic and resistive training based on initial evaluation findings, risk stratification, comorbidities and participant's personal goals.       Expected Outcomes Short Term: Attend  rehab on a regular basis to increase amount of physical activity.;Long Term: Exercising regularly at least  3-5 days a week.;Long Term: Add in home exercise to make exercise part of routine and to increase amount of physical activity.       Increase Strength and Stamina Yes       Intervention Provide advice, education, support and counseling about physical activity/exercise needs.;Develop an individualized exercise prescription for aerobic and resistive training based on initial evaluation findings, risk stratification, comorbidities and participant's personal goals.       Expected Outcomes Long Term: Improve cardiorespiratory fitness, muscular endurance and strength as measured by increased METs and functional capacity ( );Short Term: Increase workloads from initial exercise prescription for resistance, speed, and METs.;Short Term: Perform resistance training exercises routinely during rehab and add in resistance training at home       Able to understand and use rate of perceived exertion (RPE) scale Yes       Intervention Provide education and explanation on how to use RPE scale       Expected Outcomes Short Term: Able to use RPE daily in rehab to express subjective intensity level;Long Term:  Able to use RPE to guide intensity level when exercising independently       Knowledge and understanding of Target Heart Rate Range (THRR) Yes       Intervention Provide education and explanation of THRR including how the numbers were predicted and where they are located for reference       Expected Outcomes Short Term: Able to state/look up THRR;Short Term: Able to use daily as guideline for intensity in rehab;Long Term: Able to use THRR to govern intensity when exercising independently       Understanding of Exercise Prescription Yes       Intervention Provide education, explanation, and written materials on patient's individual exercise prescription       Expected Outcomes Short Term: Able to explain program exercise prescription;Long Term: Able to explain home exercise prescription to exercise independently                 Exercise Goals Re-Evaluation :  Exercise Goals Re-Evaluation     Row Name 10/24/23 1347 11/28/23 1500 12/31/23 1500         Exercise Goal Re-Evaluation   Exercise Goals Review Increase Physical Activity;Increase Strength and Stamina;Able to understand and use rate of perceived exertion (RPE) scale Increase Physical Activity;Increase Strength and Stamina;Able to understand and use rate of perceived exertion (RPE) scale;Knowledge and understanding of Target Heart Rate Range (THRR);Understanding of Exercise Prescription Increase Physical Activity;Increase Strength and Stamina;Able to understand and use rate of perceived exertion (RPE) scale;Knowledge and understanding of Target Heart Rate Range (THRR);Understanding of Exercise Prescription     Comments Quantarius was able to understand and use RPE scale appropriately. Reviewed METs and goals. Pt voices he is making progress on his goal of increased strength and stamina. Peak METs to date are 3.5. Reviewed METs and goals. Pt voices he is making progress on his goal of increased strength and stamina. Peak METs to date are 3.5. We discussed home exercise again, pt has not been walking on off days due to the cold weather. Discussed walking in the house or going somewhere indoors he can walk. Stressed concept of 150 minutes of exercise per week.     Expected Outcomes Progress workloads as tolerated to help increase strength and stamina. Continue to progress workloads as tolerated to help increase strength and stamina. Continue to progress  workloads as tolerated to help increase strength and stamina.              Discharge Exercise Prescription (Final Exercise Prescription Changes):  Exercise Prescription Changes - 12/31/23 1500       Response to Exercise   Blood Pressure (Admit) 120/56    Blood Pressure (Exit) 96/58    Heart Rate (Admit) 75 bpm    Heart Rate (Exercise) 95 bpm    Heart Rate (Exit) 79 bpm    Rating of Perceived  Exertion (Exercise) 11    Symptoms None    Comments Reviewed METs and goals    Duration Continue with 30 min of aerobic exercise without signs/symptoms of physical distress.    Intensity THRR unchanged      Progression   Progression Continue to progress workloads to maintain intensity without signs/symptoms of physical distress.    Average METs 2.8      Resistance Training   Training Prescription No    Weight No weights on Wednesdays      Interval Training   Interval Training No      NuStep   Level 4    SPM 105    Minutes 15    METs 2.5      Track   Laps 17    Minutes 15    METs 3.17      Home Exercise Plan   Plans to continue exercise at Home (comment)    Frequency Add 2 additional days to program exercise sessions.    Initial Home Exercises Provided 12/17/23             Nutrition:  Target Goals: Understanding of nutrition guidelines, daily intake of sodium 1500mg , cholesterol 200mg , calories 30% from fat and 7% or less from saturated fats, daily to have 5 or more servings of fruits and vegetables.  Biometrics:  Pre Biometrics - 10/22/23 1038       Pre Biometrics   Waist Circumference 44 inches    Hip Circumference 43.5 inches    Waist to Hip Ratio 1.01 %    Triceps Skinfold 8 mm    % Body Fat 28.2 %    Grip Strength 28 kg    Flexibility 0 in   could not reach   Single Leg Stand 12.87 seconds             Post Biometrics - 01/09/24 1310        Post  Biometrics   Height 5\' 7"  (1.702 m)    Weight 82.5 kg    Waist Circumference 42.25 inches    Hip Circumference 43 inches    Waist to Hip Ratio 0.98 %    BMI (Calculated) 28.48    Triceps Skinfold 8 mm    % Body Fat 27 %    Grip Strength 34 kg    Flexibility 11 in    Single Leg Stand 4.35 seconds             Nutrition Therapy Plan and Nutrition Goals:  Nutrition Therapy & Goals - 10/24/23 1451       Nutrition Therapy   Diet Heart healthy diet    Drug/Food Interactions  Statins/Certain Fruits      Personal Nutrition Goals   Nutrition Goal Patient to identify strategies for reducing cardiovascular risk by attending the [employer] education and nutrition series weekly.    Personal Goal #2 Patient to improve diet quality by using the plate method as a guide for meal planning to  include lean protein/plant protein, fruits, vegetables, whole grains, nonfat dairy as part of a well-balanced diet.    Comments Patient has medical history of CAD, DM2, Hyperlipidemia, s/p CABGx4, His A1c remains above goal at 7.3. HIs LDl remains well controlled but triglycerides are elevated. Patient will benefit from participation in intensive cardiac rehab for nutrition, exercise, and lifestyle modification.      Intervention Plan   Intervention Prescribe, educate and counsel regarding individualized specific dietary modifications aiming towards targeted core components such as weight, hypertension, lipid management, diabetes, heart failure and other comorbidities.;Nutrition handout(s) given to patient.    Expected Outcomes Short Term Goal: Understand basic principles of dietary content, such as calories, fat, sodium, cholesterol and nutrients.;Long Term Goal: Adherence to prescribed nutrition plan.             Nutrition Assessments:  Nutrition Assessments - 10/31/23 1528       Rate Your Plate Scores   Pre Score 59            MEDIFICTS Score Key: >=70 Need to make dietary changes  40-70 Heart Healthy Diet <= 40 Therapeutic Level Cholesterol Diet   Flowsheet Row INTENSIVE CARDIAC REHAB from 10/31/2023 in [facility], & Lung Health  Picture Your Plate Total Score on Admission 59      Picture Your Plate Scores: <16 Unhealthy dietary pattern with much room for improvement. 41-50 Dietary pattern unlikely to meet recommendations for good health and room for improvement. 51-60 More healthful dietary pattern, with some room for  improvement.  >60 Healthy dietary pattern, although there may be some specific behaviors that could be improved.    Nutrition Goals Re-Evaluation:  Nutrition Goals Re-Evaluation     Row Name 10/24/23 1451             Goals   Current Weight 184 lb 4.9 oz (83.6 kg)       Comment A1c 7.3, LDL 51, HDL 32, triglycerides 397       Expected Outcome Patient has medical history of CAD, DM2, Hyperlipidemia, s/p CABGx4, His A1c remains above goal at 7.3. HIs LDl remains well controlled but triglycerides are elevated. Patient will benefit from participation in intensive cardiac rehab for nutrition, exercise, and lifestyle modification.                Nutrition Goals Re-Evaluation:  Nutrition Goals Re-Evaluation     Row Name 10/24/23 1451             Goals   Current Weight 184 lb 4.9 oz (83.6 kg)       Comment A1c 7.3, LDL 51, HDL 32, triglycerides 397       Expected Outcome Patient has medical history of CAD, DM2, Hyperlipidemia, s/p CABGx4, His A1c remains above goal at 7.3. HIs LDl remains well controlled but triglycerides are elevated. Patient will benefit from participation in intensive cardiac rehab for nutrition, exercise, and lifestyle modification.                Nutrition Goals Discharge (Final Nutrition Goals Re-Evaluation):  Nutrition Goals Re-Evaluation - 10/24/23 1451       Goals   Current Weight 184 lb 4.9 oz (83.6 kg)    Comment A1c 7.3, LDL 51, HDL 32, triglycerides 397    Expected Outcome Patient has medical history of CAD, DM2, Hyperlipidemia, s/p CABGx4, His A1c remains above goal at 7.3. HIs LDl remains well controlled but triglycerides are elevated. Patient will benefit from  participation in intensive cardiac rehab for nutrition, exercise, and lifestyle modification.             Psychosocial: Target Goals: Acknowledge presence or absence of significant depression and/or stress, maximize coping skills, provide positive support system. Participant is  able to verbalize types and ability to use techniques and skills needed for reducing stress and depression.  Initial Review & Psychosocial Screening:  Initial Psych Review & Screening - 10/22/23 1112       Initial Review   Current issues with None Identified      Family Dynamics   Good Support System? Yes   Filippo has his daughters for support     Barriers   Psychosocial barriers to participate in program There are no identifiable barriers or psychosocial needs.      Screening Interventions   Interventions Encouraged to exercise;Provide feedback about the scores to participant    Expected Outcomes Short Term goal: Identification and review with participant of any Quality of Life or Depression concerns found by scoring the questionnaire.;Long Term goal: The participant improves quality of Life and PHQ9 Scores as seen by post scores and/or verbalization of changes             Quality of Life Scores:  Quality of Life - 10/22/23 1122       Quality of Life   Select Quality of Life      Quality of Life Scores   Health/Function Pre 27.43 %    Socioeconomic Pre 26.25 %    Psych/Spiritual Pre 29.64 %    Family Pre 28 %    GLOBAL Pre 27.77 %            Scores of 19 and below usually indicate a poorer quality of life in these areas.  A difference of  2-3 points is a clinically meaningful difference.  A difference of 2-3 points in the total score of the Quality of Life Index has been associated with significant improvement in overall quality of life, self-image, physical symptoms, and general health in studies assessing change in quality of life.  PHQ-9: Review Flowsheet       10/22/2023 03/20/2020 01/06/2020  Depression screen PHQ 2/9  Decreased Interest 0 0 0  Down, Depressed, Hopeless 0 0 0  PHQ - 2 Score 0 0 0  Altered sleeping 0 - -  Tired, decreased energy 0 - -  Change in appetite 0 - -  Feeling bad or failure about yourself  0 - -  Trouble concentrating 0 - -   Moving slowly or fidgety/restless 0 - -  Suicidal thoughts 0 - -  PHQ-9 Score 0 - -  Difficult doing work/chores Not difficult at all - -   Interpretation of Total Score  Total Score Depression Severity:  1-4 = Minimal depression, 5-9 = Mild depression, 10-14 = Moderate depression, 15-19 = Moderately severe depression, 20-27 = Severe depression   Psychosocial Evaluation and Intervention:   Psychosocial Re-Evaluation:  Psychosocial Re-Evaluation     Row Name 10/24/23 1701 11/19/23 1633 12/16/23 0827 01/09/24 1022       Psychosocial Re-Evaluation   Current issues with None Identified None Identified None Identified None Identified    Comments -- -- -- Alann will complete cardiac rehab on 01/14/24.    Interventions Encouraged to attend Cardiac Rehabilitation for the exercise Encouraged to attend Cardiac Rehabilitation for the exercise Encouraged to attend Cardiac Rehabilitation for the exercise Encouraged to attend Cardiac Rehabilitation for the exercise  Continue Psychosocial Services  No Follow up required No Follow up required No Follow up required No Follow up required             Psychosocial Discharge (Final Psychosocial Re-Evaluation):  Psychosocial Re-Evaluation - 01/09/24 1022       Psychosocial Re-Evaluation   Current issues with None Identified    Comments [place] will complete cardiac rehab on 01/14/24.    Interventions Encouraged to attend Cardiac Rehabilitation for the exercise    Continue Psychosocial Services  No Follow up required             Vocational Rehabilitation: Provide vocational rehab assistance to qualifying candidates.   Vocational Rehab Evaluation & Intervention:  Vocational Rehab - 10/22/23 1114       Initial Vocational Rehab Evaluation & Intervention   Assessment shows need for Vocational Rehabilitation No   [employer] is retired            Education: Education Goals: Education classes will be provided on a weekly basis, covering  required topics. Participant will state understanding/return demonstration of topics presented.    Education     Row Name 10/24/23 1300     Education   Cardiac Education Topics Pritikin   Hospital doctor Education   General Education Heart Disease Risk Reduction   Instruction Review Code 1- Verbalizes Understanding   Class Start Time 1157   Class Stop Time 1234   Class Time Calculation (min) 37 min    Row Name 10/27/23 1300     Education   Cardiac Education Topics Pritikin   Select Workshops     Workshops   Educator Exercise Physiologist   Select Exercise   Exercise Workshop Location manager and Fall Prevention   Instruction Review Code 1- Verbalizes Understanding   Class Start Time 1150   Class Stop Time 1240   Class Time Calculation (min) 50 min    Row Name 10/31/23 1300     Education   Cardiac Education Topics Pritikin   Licensed conveyancer Nutrition   Nutrition Overview of the Pritikin Eating Plan   Instruction Review Code 1- Verbalizes Understanding   Class Start Time 1145   Class Stop Time 1221   Class Time Calculation (min) 36 min    Row Name 11/03/23 1300     Education   Cardiac Education Topics Pritikin   Nurse, children's Exercise Physiologist   Select Psychosocial   Psychosocial Healthy Minds, Bodies, Hearts   Instruction Review Code 1- Verbalizes Understanding   Class Start Time 1142   Class Stop Time 1218   Class Time Calculation (min) 36 min    Row Name 11/05/23 1200     Education   Cardiac Education Topics Pritikin   Select Core Videos     Core Videos   Educator Nurse   Select Nutrition   Nutrition Becoming a Pritikin Chef   Instruction Review Code 1- Verbalizes Understanding   Class Start Time 1146   Class Stop Time 1224   Class Time Calculation (min) 38 min    Row Name 11/10/23 1400      Education   Cardiac Education Topics Pritikin   Nurse, children's Exercise Physiologist   Select Nutrition  Nutrition Other   Instruction Review Code 1- Verbalizes Understanding   Class Start Time 1151   Class Stop Time 1227   Class Time Calculation (min) 36 min    Row Name 11/14/23 1500     Education   Cardiac Education Topics Pritikin   Glass blower/designer Nutrition   Nutrition Workshop Label Reading   Instruction Review Code 1- Verbalizes Understanding   Class Start Time 1144   Class Stop Time 1219   Class Time Calculation (min) 35 min    Row Name 11/17/23 1600     Education   Cardiac Education Topics Pritikin   Select Workshops     Workshops   Educator Exercise Physiologist   Select Psychosocial   Psychosocial Workshop Recognizing and Reducing Stress   Instruction Review Code 1- Verbalizes Understanding   Class Start Time 1142   Class Stop Time 1231   Class Time Calculation (min) 49 min    Row Name 11/19/23 1400     Education   Cardiac Education Topics Pritikin   Customer service manager   Weekly Topic Efficiency Cooking - Meals in a Snap   Instruction Review Code 1- Verbalizes Understanding   Class Start Time 1143   Class Stop Time 1226   Class Time Calculation (min) 43 min    Row Name 11/21/23 1500     Education   Cardiac Education Topics Pritikin   Nurse, children's   Educator Dietitian   Select Nutrition   Nutrition Calorie Density   Instruction Review Code 1- Verbalizes Understanding   Class Start Time 1149   Class Stop Time 1227   Class Time Calculation (min) 38 min    Row Name 11/24/23 1600     Education   Cardiac Education Topics Pritikin   Select Workshops     Workshops   Educator Exercise Physiologist   Select Exercise   Exercise Workshop Exercise Basics: Building Your Action Plan   Instruction  Review Code 1- Verbalizes Understanding   Class Start Time 1155   Class Stop Time 1237   Class Time Calculation (min) 42 min    Row Name 11/26/23 1300     Education   Cardiac Education Topics Pritikin   Orthoptist   Educator Dietitian   Weekly Topic One-Pot Wonders   Instruction Review Code 1- Verbalizes Understanding   Class Start Time 1144   Class Stop Time 1219   Class Time Calculation (min) 35 min    Row Name 11/28/23 1200     Education   Cardiac Education Topics Pritikin   Psychologist, forensic Exercise Education   Exercise Education Move It!   Instruction Review Code 1- Verbalizes Understanding   Class Start Time 1150   Class Stop Time 1230   Class Time Calculation (min) 40 min    Row Name 12/01/23 1100     Education   Cardiac Education Topics Pritikin   Psychologist, forensic General Education   General Education Hypertension and Heart Disease   Instruction Review Code 1- Verbalizes Understanding   Class Start Time 1147   Class Stop Time 1221   Class Time Calculation (  min) 34 min    Row Name 12/05/23 1400     Education   Cardiac Education Topics Pritikin   Glass blower/designer Nutrition   Nutrition Workshop Targeting Your Nutrition Priorities   Instruction Review Code 1- Verbalizes Understanding   Class Start Time 1145   Class Stop Time 1228   Class Time Calculation (min) 43 min    Row Name 12/08/23 1300     Education   Cardiac Education Topics Pritikin   Select Workshops     Workshops   Educator Exercise Physiologist   Select Psychosocial   Psychosocial Workshop Focused Goals, Sustainable Changes   Instruction Review Code 1- Verbalizes Understanding   Class Start Time 1143   Class Stop Time 1222   Class Time Calculation (min) 39 min    Row Name 12/12/23  1400     Education   Cardiac Education Topics Pritikin   Licensed conveyancer Nutrition   Nutrition Dining Out - Part 1   Instruction Review Code 1- Verbalizes Understanding   Class Start Time 1358   Class Stop Time 1442   Class Time Calculation (min) 44 min    Row Name 12/15/23 1100     Education   Cardiac Education Topics Pritikin   Psychologist, forensic Exercise Education   Exercise Education Biomechanial Limitations   Instruction Review Code 1- Verbalizes Understanding   Class Start Time 1144   Class Stop Time 1217   Class Time Calculation (min) 33 min    Row Name 12/17/23 1400     Education   Cardiac Education Topics Pritikin   Customer service manager   Weekly Topic Comforting Weekend Breakfasts   Instruction Review Code 1- Verbalizes Understanding   Class Start Time 1145   Class Stop Time 1230   Class Time Calculation (min) 45 min    Row Name 12/22/23 1100     Education   Cardiac Education Topics Pritikin   Psychologist, forensic Exercise Education   Exercise Education Improving Performance   Instruction Review Code 1- Verbalizes Understanding   Class Start Time 1144   Class Stop Time 1231   Class Time Calculation (min) 47 min    Row Name 12/24/23 1400     Education   Cardiac Education Topics Pritikin   Customer service manager   Weekly Topic Fast Evening Meals   Instruction Review Code 1- Verbalizes Understanding   Class Start Time 1148   Class Stop Time 1227   Class Time Calculation (min) 39 min    Row Name 12/29/23 1300     Education   Cardiac Education Topics Pritikin   Geographical information systems officer Psychosocial   Psychosocial Workshop Healthy Sleep for a  Healthy Heart   Instruction Review Code 1- Verbalizes Understanding   Class Start Time 1155   Class Stop Time 1245   Class Time Calculation (min) 50 min    Row Name 12/31/23 1300     Education   Cardiac Education Topics Pritikin   [employer]  Systems analyst Cuisine- Spotlight on the [employer]   Instruction Review Code 1- Verbalizes Understanding   Class Start Time 1145   Class Stop Time 1230   Class Time Calculation (min) 45 min    Row Name 01/02/24 1100     Education   Cardiac Education Topics Pritikin   Nurse, children's Exercise Physiologist   Select Psychosocial   Psychosocial How Our Thoughts Can Heal Our Hearts   Instruction Review Code 1- Verbalizes Understanding   Class Start Time 1152   Class Stop Time 1226   Class Time Calculation (min) 34 min    Row Name 01/09/24 1200     Education   Cardiac Education Topics Pritikin   Hospital doctor Education   General Education Hypertension and Heart Disease   Instruction Review Code 1- Verbalizes Understanding   Class Start Time 1148   Class Stop Time 1225   Class Time Calculation (min) 37 min            Core Videos: Exercise    Move It!  Clinical staff conducted group or individual video education with verbal and written material and guidebook.  Patient learns the recommended Pritikin exercise program. Exercise with the goal of living a long, healthy life. Some of the health benefits of exercise include controlled diabetes, healthier blood pressure levels, improved cholesterol levels, improved heart and lung capacity, improved sleep, and better body composition. Everyone should speak with their doctor before starting or changing an exercise routine.  Biomechanical Limitations Clinical staff conducted group or individual video education with  verbal and written material and guidebook.  Patient learns how biomechanical limitations can impact exercise and how we can mitigate and possibly overcome limitations to have an impactful and balanced exercise routine.  Body Composition Clinical staff conducted group or individual video education with verbal and written material and guidebook.  Patient learns that body composition (ratio of muscle mass to fat mass) is a key component to assessing overall fitness, rather than body weight alone. Increased fat mass, especially visceral belly fat, can put [place] at increased risk for metabolic syndrome, type 2 diabetes, heart disease, and even death. It is recommended to combine diet and exercise (cardiovascular and resistance training) to improve your body composition. Seek guidance from your physician and exercise physiologist before implementing an exercise routine.  Exercise Action Plan Clinical staff conducted group or individual video education with verbal and written material and guidebook.  Patient learns the recommended strategies to achieve and enjoy long-term exercise adherence, including variety, self-motivation, self-efficacy, and positive decision making. Benefits of exercise include fitness, good health, weight management, more energy, better sleep, less stress, and overall well-being.  Medical   Heart Disease Risk Reduction Clinical staff conducted group or individual video education with verbal and written material and guidebook.  Patient learns our heart is our most vital organ as it circulates oxygen, nutrients, white blood cells, and hormones throughout the entire body, and carries waste away. Data supports a plant-based eating plan like the Pritikin Program for its effectiveness in slowing progression of and reversing heart disease. The video provides a number of recommendations to address heart disease.   Metabolic Syndrome and Belly Fat  Clinical staff conducted group or individual  video education with verbal and written material and guidebook.  Patient  learns what metabolic syndrome is, how it leads to heart disease, and how one can reverse it and keep it from coming back. You have metabolic syndrome if you have 3 of the following 5 criteria: abdominal obesity, high blood pressure, high triglycerides, low HDL cholesterol, and high blood sugar.  Hypertension and Heart Disease Clinical staff conducted group or individual video education with verbal and written material and guidebook.  Patient learns that high blood pressure, or hypertension, is very common in the [place]. Hypertension is largely due to excessive salt intake, but other important risk factors include being overweight, physical inactivity, drinking too much alcohol, smoking, and not eating enough potassium from fruits and vegetables. High blood pressure is a leading risk factor for heart attack, stroke, congestive heart failure, dementia, kidney failure, and premature death. Long-term effects of excessive salt intake include stiffening of the arteries and thickening of heart muscle and organ damage. Recommendations include ways to reduce hypertension and the risk of heart disease.  Diseases of Our Time - Focusing on Diabetes Clinical staff conducted group or individual video education with verbal and written material and guidebook.  Patient learns why the best way to stop diseases of our time is prevention, through food and other lifestyle changes. Medicine (such as prescription pills and surgeries) is often only a Band-Aid on the problem, not a long-term solution. Most common diseases of our time include obesity, type 2 diabetes, hypertension, heart disease, and cancer. The Pritikin Program is recommended and has been proven to help reduce, reverse, and/or prevent the damaging effects of metabolic syndrome.  Nutrition   Overview of the Pritikin Eating Plan  Clinical staff conducted group or individual video  education with verbal and written material and guidebook.  Patient learns about the Pritikin Eating Plan for disease risk reduction. The Pritikin Eating Plan emphasizes a wide variety of unrefined, minimally-processed carbohydrates, like fruits, vegetables, whole grains, and legumes. Go, Caution, and Stop food choices are explained. Plant-based and lean animal proteins are emphasized. Rationale provided for low sodium intake for blood pressure control, low added sugars for blood sugar stabilization, and low added fats and oils for coronary artery disease risk reduction and weight management.  Calorie Density  Clinical staff conducted group or individual video education with verbal and written material and guidebook.  Patient learns about calorie density and how it impacts the Pritikin Eating Plan. Knowing the characteristics of the food you choose will help you decide whether those foods will lead to weight gain or weight loss, and whether you want to consume more or less of them. Weight loss is usually a side effect of the Pritikin Eating Plan because of its focus on low calorie-dense foods.  Label Reading  Clinical staff conducted group or individual video education with verbal and written material and guidebook.  Patient learns about the Pritikin recommended label reading guidelines and corresponding recommendations regarding calorie density, added sugars, sodium content, and whole grains.  Dining Out - Part 1  Clinical staff conducted group or individual video education with verbal and written material and guidebook.  Patient learns that restaurant meals can be sabotaging because they can be so high in calories, fat, sodium, and/or sugar. Patient learns recommended strategies on how to positively address this and avoid unhealthy pitfalls.  Facts on Fats  Clinical staff conducted group or individual video education with verbal and written material and guidebook.  Patient learns that lifestyle  modifications can be just as effective, if not more so, as many  medications for lowering your risk of heart disease. A Pritikin lifestyle can help to reduce your risk of inflammation and atherosclerosis (cholesterol build-up, or plaque, in the artery walls). Lifestyle interventions such as dietary choices and physical activity address the cause of atherosclerosis. A review of the types of fats and their impact on blood cholesterol levels, along with dietary recommendations to reduce fat intake is also included.  Nutrition Action Plan  Clinical staff conducted group or individual video education with verbal and written material and guidebook.  Patient learns how to incorporate Pritikin recommendations into their lifestyle. Recommendations include planning and keeping personal health goals in mind as an important part of their success.  Healthy Mind-Set    Healthy Minds, Bodies, Hearts  Clinical staff conducted group or individual video education with verbal and written material and guidebook.  Patient learns how to identify when they are stressed. Video will discuss the impact of that stress, as well as the many benefits of stress management. Patient will also be introduced to stress management techniques. The way we think, act, and feel has an impact on our hearts.  How Our Thoughts Can Heal Our Hearts  Clinical staff conducted group or individual video education with verbal and written material and guidebook.  Patient learns that negative thoughts can cause depression and anxiety. This can result in negative lifestyle behavior and serious health problems. Cognitive behavioral therapy is an effective method to help control our thoughts in order to change and improve our emotional outlook.  Additional Videos:  Exercise    Improving Performance  Clinical staff conducted group or individual video education with verbal and written material and guidebook.  Patient learns to use a non-linear approach  by alternating intensity levels and lengths of time spent exercising to help burn more calories and lose more body fat. Cardiovascular exercise helps improve heart health, metabolism, hormonal balance, blood sugar control, and recovery from fatigue. Resistance training improves strength, endurance, balance, coordination, reaction time, metabolism, and muscle mass. Flexibility exercise improves circulation, posture, and balance. Seek guidance from your physician and exercise physiologist before implementing an exercise routine and learn your capabilities and proper form for all exercise.  Introduction to Yoga  Clinical staff conducted group or individual video education with verbal and written material and guidebook.  Patient learns about yoga, a discipline of the coming together of mind, breath, and body. The benefits of yoga include improved flexibility, improved range of motion, better posture and core strength, increased lung function, weight loss, and positive self-image. Yoga's heart health benefits include lowered blood pressure, healthier heart rate, decreased cholesterol and triglyceride levels, improved immune function, and reduced stress. Seek guidance from your physician and exercise physiologist before implementing an exercise routine and learn your capabilities and proper form for all exercise.  Medical   Aging: Enhancing Your Quality of Life  Clinical staff conducted group or individual video education with verbal and written material and guidebook.  Patient learns key strategies and recommendations to stay in good physical health and enhance quality of life, such as prevention strategies, having an advocate, securing a Health Care Proxy and Power of Attorney, and keeping a list of medications and system for tracking them. It also discusses how to avoid risk for bone loss.  Biology of Weight Control  Clinical staff conducted group or individual video education with verbal and written  material and guidebook.  Patient learns that weight gain occurs because we consume more calories than we burn (eating more, moving less). Even  if your body weight is normal, you may have higher ratios of fat compared to muscle mass. Too much body fat puts you at increased risk for cardiovascular disease, heart attack, stroke, type 2 diabetes, and obesity-related cancers. In addition to exercise, following the Pritikin Eating Plan can help reduce your risk.  Decoding Lab Results  Clinical staff conducted group or individual video education with verbal and written material and guidebook.  Patient learns that lab test reflects one measurement whose values change over time and are influenced by many factors, including medication, stress, sleep, exercise, food, hydration, pre-existing medical conditions, and more. It is recommended to use the knowledge from this video to become more involved with your lab results and evaluate your numbers to speak with your doctor.   Diseases of Our Time - Overview  Clinical staff conducted group or individual video education with verbal and written material and guidebook.  Patient learns that according to the CDC, 50% to 70% of chronic diseases (such as obesity, type 2 diabetes, elevated lipids, hypertension, and heart disease) are avoidable through lifestyle improvements including healthier food choices, listening to satiety cues, and increased physical activity.  Sleep Disorders Clinical staff conducted group or individual video education with verbal and written material and guidebook.  Patient learns how good quality and duration of sleep are important to overall health and well-being. Patient also learns about sleep disorders and how they impact health along with recommendations to address them, including discussing with a physician.  Nutrition  Dining Out - Part 2 Clinical staff conducted group or individual video education with verbal and written material and  guidebook.  Patient learns how to plan ahead and communicate in order to maximize their dining experience in a healthy and nutritious manner. Included are recommended food choices based on the type of restaurant the patient is visiting.   Fueling a Banker conducted group or individual video education with verbal and written material and guidebook.  There is a strong connection between our food choices and our health. Diseases like obesity and type 2 diabetes are very prevalent and are in large-part due to lifestyle choices. The Pritikin Eating Plan provides plenty of food and hunger-curbing satisfaction. It is easy to follow, affordable, and helps reduce health risks.  Menu Workshop  Clinical staff conducted group or individual video education with verbal and written material and guidebook.  Patient learns that restaurant meals can sabotage health goals because they are often packed with calories, fat, sodium, and sugar. Recommendations include strategies to plan ahead and to communicate with the manager, chef, or server to help order a healthier meal.  Planning Your Eating Strategy  Clinical staff conducted group or individual video education with verbal and written material and guidebook.  Patient learns about the Pritikin Eating Plan and its benefit of reducing the risk of disease. The Pritikin Eating Plan does not focus on calories. Instead, it emphasizes high-quality, nutrient-rich foods. By knowing the characteristics of the foods, we choose, we can determine their calorie density and make informed decisions.  Targeting Your Nutrition Priorities  Clinical staff conducted group or individual video education with verbal and written material and guidebook.  Patient learns that lifestyle habits have a tremendous impact on disease risk and progression. This video provides eating and physical activity recommendations based on your personal health goals, such as reducing LDL  cholesterol, losing weight, preventing or controlling type 2 diabetes, and reducing high blood pressure.  Vitamins and Minerals  Clinical staff  conducted group or individual video education with verbal and written material and guidebook.  Patient learns different ways to obtain key vitamins and minerals, including through a recommended healthy diet. It is important to discuss all supplements you take with your doctor.   Healthy Mind-Set    Smoking Cessation  Clinical staff conducted group or individual video education with verbal and written material and guidebook.  Patient learns that cigarette smoking and tobacco addiction pose a serious health risk which affects millions of people. Stopping smoking will significantly reduce the risk of heart disease, lung disease, and many forms of cancer. Recommended strategies for quitting are covered, including working with your doctor to develop a successful plan.  Culinary   Becoming a Set designer conducted group or individual video education with verbal and written material and guidebook.  Patient learns that cooking at home can be healthy, cost-effective, quick, and puts them in control. Keys to cooking healthy recipes will include looking at your recipe, assessing your equipment needs, planning ahead, making it simple, choosing cost-effective seasonal ingredients, and limiting the use of added fats, salts, and sugars.  Cooking - Breakfast and Snacks  Clinical staff conducted group or individual video education with verbal and written material and guidebook.  Patient learns how important breakfast is to satiety and nutrition through the entire day. Recommendations include key foods to eat during breakfast to help stabilize blood sugar levels and to prevent overeating at meals later in the day. Planning ahead is also a key component.  Cooking - Educational psychologist conducted group or individual video education with verbal  and written material and guidebook.  Patient learns eating strategies to improve overall health, including an approach to cook more at home. Recommendations include thinking of animal protein as a side on your plate rather than center stage and focusing instead on lower calorie dense options like vegetables, fruits, whole grains, and plant-based proteins, such as beans. Making sauces in large quantities to freeze for later and leaving the skin on your vegetables are also recommended to maximize your experience.  Cooking - Healthy Salads and Dressing Clinical staff conducted group or individual video education with verbal and written material and guidebook.  Patient learns that vegetables, fruits, whole grains, and legumes are the foundations of the Pritikin Eating Plan. Recommendations include how to incorporate each of these in flavorful and healthy salads, and how to create homemade salad dressings. Proper handling of ingredients is also covered. Cooking - Soups and [employer] - Soups and Desserts Clinical staff conducted group or individual video education with verbal and written material and guidebook.  Patient learns that Pritikin soups and desserts make for easy, nutritious, and delicious snacks and meal components that are low in sodium, fat, sugar, and calorie density, while high in vitamins, minerals, and filling fiber. Recommendations include simple and healthy ideas for soups and desserts.   Overview     The Pritikin Solution Program Overview Clinical staff conducted group or individual video education with verbal and written material and guidebook.  Patient learns that the results of the Pritikin Program have been documented in more than 100 articles published in peer-reviewed journals, and the benefits include reducing risk factors for (and, in some cases, even reversing) high cholesterol, high blood pressure, type 2 diabetes, obesity, and more! An overview of the three key pillars  of the Pritikin Program will be covered: eating well, doing regular exercise, and having a healthy mind-set.  WORKSHOPS  Exercise: Exercise Basics: Building Your Action Plan Clinical staff led group instruction and group discussion with PowerPoint presentation and patient guidebook. To enhance the learning environment the use of posters, models and videos may be added. At the conclusion of this workshop, patients will comprehend the difference between physical activity and exercise, as well as the benefits of incorporating both, into their routine. Patients will understand the FITT (Frequency, Intensity, Time, and Type) principle and how to use it to build an exercise action plan. In addition, safety concerns and other considerations for exercise and cardiac rehab will be addressed by the presenter. The purpose of this lesson is to promote a comprehensive and effective weekly exercise routine in order to improve patients' overall level of fitness.   Managing Heart Disease: Your Path to a Healthier Heart Clinical staff led group instruction and group discussion with PowerPoint presentation and patient guidebook. To enhance the learning environment the use of posters, models and videos may be added.At the conclusion of this workshop, patients will understand the anatomy and physiology of the heart. Additionally, they will understand how Pritikin's three pillars impact the risk factors, the progression, and the management of heart disease.  The purpose of this lesson is to provide a high-level overview of the heart, heart disease, and how the Pritikin lifestyle positively impacts risk factors.  Exercise Biomechanics Clinical staff led group instruction and group discussion with PowerPoint presentation and patient guidebook. To enhance the learning environment the use of posters, models and videos may be added. Patients will learn how the structural parts of their bodies function and how these  functions impact their daily activities, movement, and exercise. Patients will learn how to promote a neutral spine, learn how to manage pain, and identify ways to improve their physical movement in order to promote healthy living. The purpose of this lesson is to expose patients to common physical limitations that impact physical activity. Participants will learn practical ways to adapt and manage aches and pains, and to minimize their effect on regular exercise. Patients will learn how to maintain good posture while sitting, walking, and lifting.  Balance Training and Fall Prevention  Clinical staff led group instruction and group discussion with PowerPoint presentation and patient guidebook. To enhance the learning environment the use of posters, models and videos may be added. At the conclusion of this workshop, patients will understand the importance of their sensorimotor skills (vision, proprioception, and the vestibular system) in maintaining their ability to balance as they age. Patients will apply a variety of balancing exercises that are appropriate for their current level of function. Patients will understand the common causes for poor balance, possible solutions to these problems, and ways to modify their physical environment in order to minimize their fall risk. The purpose of this lesson is to teach patients about the importance of maintaining balance as they age and ways to minimize their risk of falling.  WORKSHOPS   Nutrition:  Fueling a Ship broker led group instruction and group discussion with PowerPoint presentation and patient guidebook. To enhance the learning environment the use of posters, models and videos may be added. Patients will review the foundational principles of the Pritikin Eating Plan and understand what constitutes a serving size in each of the food groups. Patients will also learn Pritikin-friendly foods that are better choices when away from  home and review make-ahead meal and snack options. Calorie density will be reviewed and applied to three nutrition priorities: weight maintenance, weight loss, and weight  gain. The purpose of this lesson is to reinforce (in a group setting) the key concepts around what patients are recommended to eat and how to apply these guidelines when away from home by planning and selecting Pritikin-friendly options. Patients will understand how calorie density may be adjusted for different weight management goals.  Mindful Eating  Clinical staff led group instruction and group discussion with PowerPoint presentation and patient guidebook. To enhance the learning environment the use of posters, models and videos may be added. Patients will briefly review the concepts of the Pritikin Eating Plan and the importance of low-calorie dense foods. The concept of mindful eating will be introduced as well as the importance of paying attention to internal hunger signals. Triggers for non-hunger eating and techniques for dealing with triggers will be explored. The purpose of this lesson is to provide patients with the opportunity to review the basic principles of the Pritikin Eating Plan, discuss the value of eating mindfully and how to measure internal cues of hunger and fullness using the Hunger Scale. Patients will also discuss reasons for non-hunger eating and learn strategies to use for controlling emotional eating.  Targeting Your Nutrition Priorities Clinical staff led group instruction and group discussion with PowerPoint presentation and patient guidebook. To enhance the learning environment the use of posters, models and videos may be added. Patients will learn how to determine their genetic susceptibility to disease by reviewing their family history. Patients will gain insight into the importance of diet as part of an overall healthy lifestyle in mitigating the impact of genetics and other environmental insults. The  purpose of this lesson is to provide patients with the opportunity to assess their personal nutrition priorities by looking at their family history, their own health history and current risk factors. Patients will also be able to discuss ways of prioritizing and modifying the Pritikin Eating Plan for their highest risk areas  Menu  Clinical staff led group instruction and group discussion with PowerPoint presentation and patient guidebook. To enhance the learning environment the use of posters, models and videos may be added. Using menus brought in from [employer], or printed from [employer], patients will apply the Pritikin dining out guidelines that were presented in the [employer] video. Patients will also be able to practice these guidelines in a variety of provided scenarios. The purpose of this lesson is to provide patients with the opportunity to practice hands-on learning of the Pritikin Dining Out guidelines with actual menus and practice scenarios.  Label Reading Clinical staff led group instruction and group discussion with PowerPoint presentation and patient guidebook. To enhance the learning environment the use of posters, models and videos may be added. Patients will review and discuss the Pritikin label reading guidelines presented in Pritikin's Label Reading Educational series video. Using fool labels brought in from local grocery stores and markets, patients will apply the label reading guidelines and determine if the packaged food meet the Pritikin guidelines. The purpose of this lesson is to provide patients with the opportunity to review, discuss, and practice hands-on learning of the Pritikin Label Reading guidelines with actual packaged food labels. Cooking School  Pritikin's [employer] are designed to teach patients ways to prepare quick, simple, and affordable recipes at home. The importance of nutrition's role in chronic disease risk  reduction is reflected in its emphasis in the overall Pritikin program. By learning how to prepare essential core Pritikin Eating Plan recipes, patients will increase control over what they  eat; be able to customize the flavor of foods without the use of added salt, sugar, or fat; and improve the quality of the food they consume. By learning a set of core recipes which are easily assembled, quickly prepared, and affordable, patients are more likely to prepare more healthy foods at home. These workshops focus on convenient breakfasts, simple entres, side dishes, and desserts which can be prepared with minimal effort and are consistent with nutrition recommendations for cardiovascular risk reduction. Cooking [employer] are taught by a Armed forces logistics/support/administrative officer (RD) who has been trained by the [employer]. The chef or RD has a clear understanding of the importance of minimizing - if not completely eliminating - added fat, sugar, and sodium in recipes. Throughout the series of Cooking School Workshop sessions, patients will learn about healthy ingredients and efficient methods of cooking to build confidence in their capability to prepare    Cooking School weekly topics:  Adding Flavor- Sodium-Free  Fast and Healthy Breakfasts  Powerhouse Plant-Based Proteins  Satisfying Salads and Dressings  Simple Sides and Sauces  International Cuisine-Spotlight on the [employer] Zones  Delicious Desserts  Savory Soups  [employer] - Meals in a Astronomer Appetizers and Snacks  Comforting Weekend Breakfasts  One-Pot Wonders   Fast Evening Meals  Landscape architect Your Pritikin Plate  WORKSHOPS   Healthy Mindset (Psychosocial):  Focused Goals, Sustainable Changes Clinical staff led group instruction and group discussion with PowerPoint presentation and patient guidebook. To enhance the learning environment the use of posters, models and videos may be added. Patients  will be able to apply effective goal setting strategies to establish at least one personal goal, and then take consistent, meaningful action toward that goal. They will learn to identify common barriers to achieving personal goals and develop strategies to overcome them. Patients will also gain an understanding of how our mind-set can impact our ability to achieve goals and the importance of cultivating a positive and growth-oriented mind-set. The purpose of this lesson is to provide patients with a deeper understanding of how to set and achieve personal goals, as well as the tools and strategies needed to overcome common obstacles which may arise along the way.  From Head to Heart: The Power of a Healthy Outlook  Clinical staff led group instruction and group discussion with PowerPoint presentation and patient guidebook. To enhance the learning environment the use of posters, models and videos may be added. Patients will be able to recognize and describe the impact of emotions and mood on physical health. They will discover the importance of self-care and explore self-care practices which may work for them. Patients will also learn how to utilize the 4 C's to cultivate a healthier outlook and better manage stress and challenges. The purpose of this lesson is to demonstrate to patients how a healthy outlook is an essential part of maintaining good health, especially as they continue their cardiac rehab journey.  Healthy Sleep for a Healthy Heart Clinical staff led group instruction and group discussion with PowerPoint presentation and patient guidebook. To enhance the learning environment the use of posters, models and videos may be added. At the conclusion of this workshop, patients will be able to demonstrate knowledge of the importance of sleep to overall health, well-being, and quality of life. They will understand the symptoms of, and treatments for, common sleep disorders. Patients will also be able to  identify daytime and nighttime behaviors which impact sleep, and they  will be able to apply these tools to help manage sleep-related challenges. The purpose of this lesson is to provide patients with a general overview of sleep and outline the importance of quality sleep. Patients will learn about a few of the most common sleep disorders. Patients will also be introduced to the concept of "sleep hygiene," and discover ways to self-manage certain sleeping problems through simple daily behavior changes. Finally, the workshop will motivate patients by clarifying the links between quality sleep and their goals of heart-healthy living.   Recognizing and Reducing Stress Clinical staff led group instruction and group discussion with PowerPoint presentation and patient guidebook. To enhance the learning environment the use of posters, models and videos may be added. At the conclusion of this workshop, patients will be able to understand the types of stress reactions, differentiate between acute and chronic stress, and recognize the impact that chronic stress has on their health. They will also be able to apply different coping mechanisms, such as reframing negative self-talk. Patients will have the opportunity to practice a variety of stress management techniques, such as deep abdominal breathing, progressive muscle relaxation, and/or guided imagery.  The purpose of this lesson is to educate patients on the role of stress in their lives and to provide healthy techniques for coping with it.  Learning Barriers/Preferences:  Learning Barriers/Preferences - 10/22/23 1112       Learning Barriers/Preferences   Learning Barriers Sight;Hearing    Learning Preferences Computer/Internet;Group Instruction;Individual Instruction;Skilled Demonstration;Pictoral;Written Material             Education Topics:  Knowledge Questionnaire Score:  Knowledge Questionnaire Score - 10/22/23 1113       Knowledge  Questionnaire Score   Pre Score 19/24             Core Components/Risk Factors/Patient Goals at Admission:  Personal Goals and Risk Factors at Admission - 10/22/23 1114       Core Components/Risk Factors/Patient Goals on Admission    Weight Management Yes;Weight Loss    Intervention Weight Management: Develop a combined nutrition and exercise program designed to reach desired caloric intake, while maintaining appropriate intake of nutrient and fiber, sodium and fats, and appropriate energy expenditure required for the weight goal.;Weight Management: Provide education and appropriate resources to help participant work on and attain dietary goals.    Expected Outcomes Short Term: Continue to assess and modify interventions until short term weight is achieved;Long Term: Adherence to nutrition and physical activity/exercise program aimed toward attainment of established weight goal;Weight Loss: Understanding of general recommendations for a balanced deficit meal plan, which promotes 1-2 lb weight loss per week and includes a negative energy balance of [phone] kcal/d;Understanding of distribution of calorie intake throughout the day with the consumption of 4-5 meals/snacks;Understanding recommendations for meals to include 15-35% energy as protein, 25-35% energy from fat, 35-60% energy from carbohydrates, less than 200mg  of dietary cholesterol, 20-35 gm of total fiber daily    Diabetes Yes    Intervention Provide education about signs/symptoms and action to take for hypo/hyperglycemia.;Provide education about proper nutrition, including hydration, and aerobic/resistive exercise prescription along with prescribed medications to achieve blood glucose in normal ranges: Fasting glucose 65-99 mg/dL    Expected Outcomes Short Term: Participant verbalizes understanding of the signs/symptoms and immediate care of hyper/hypoglycemia, proper foot care and importance of medication, aerobic/resistive exercise and  nutrition plan for blood glucose control.;Long Term: Attainment of HbA1C < 7%.    Hypertension Yes    Intervention Provide education on  lifestyle modifcations including regular physical activity/exercise, weight management, moderate sodium restriction and increased consumption of fresh fruit, vegetables, and low fat dairy, alcohol moderation, and smoking cessation.;Monitor prescription use compliance.    Expected Outcomes Short Term: Continued assessment and intervention until BP is < 140/90mm HG in hypertensive participants. < 130/78mm HG in hypertensive participants with diabetes, heart failure or chronic kidney disease.;Long Term: Maintenance of blood pressure at goal levels.    Lipids Yes    Intervention Provide education and support for participant on nutrition & aerobic/resistive exercise along with prescribed medications to achieve LDL 70mg , HDL >40mg .    Expected Outcomes Short Term: Participant states understanding of desired cholesterol values and is compliant with medications prescribed. Participant is following exercise prescription and nutrition guidelines.;Long Term: Cholesterol controlled with medications as prescribed, with individualized exercise RX and with personalized nutrition plan. Value goals: LDL < 70mg , HDL > 40 mg.             Core Components/Risk Factors/Patient Goals Review:   Goals and Risk Factor Review     Row Name 10/24/23 1701 11/19/23 1635 12/16/23 0835 01/09/24 1023       Core Components/Risk Factors/Patient Goals Review   Personal Goals Review Weight Management/Obesity;Hypertension;Lipids;Diabetes Weight Management/Obesity;Hypertension;Lipids;Diabetes Weight Management/Obesity;Hypertension;Lipids;Diabetes Weight Management/Obesity;Hypertension;Lipids;Diabetes    Review Tammy started cardiac rehab on 10/24/23. Cantrell did well with exercise. Vital signs were stable. Amias has been doing well with exercise. Vital signs have been  stable Since blood pressure  medications have been adjusted. Rien has lost 1 kg since starting cardiac rehab. Harinder continues to do  well with exercise. Vital signs and CBG's  remain stable. Jozsef has lost 1 kg since starting cardiac rehab. Zymere continues to do  well with exercise. Vital signs and CBG's  remain stable. Marios has lost 2.3 kg since starting cardiac rehab.    Expected Outcomes Makaveli will continue to participate in cardiac rehab for exercise, nutrition and lifestyle modifications Kenechukwu will continue to participate in cardiac rehab for exercise, nutrition and lifestyle modifications Bianca will continue to participate in cardiac rehab for exercise, nutrition and lifestyle modifications Nhan will continue to participate in cardiac rehab for exercise, nutrition and lifestyle modifications             Core Components/Risk Factors/Patient Goals at Discharge (Final Review):   Goals and Risk Factor Review - 01/09/24 1023       Core Components/Risk Factors/Patient Goals Review   Personal Goals Review Weight Management/Obesity;Hypertension;Lipids;Diabetes    Review Taiki continues to do  well with exercise. Vital signs and CBG's  remain stable. Vicky has lost 2.3 kg since starting cardiac rehab.    Expected Outcomes Umar will continue to participate in cardiac rehab for exercise, nutrition and lifestyle modifications             ITP Comments:  ITP Comments     Row Name 10/22/23 1039 10/24/23 1700 11/19/23 1632 12/16/23 0827 01/09/24 1020   ITP Comments Dr. Armanda Magic medical director. Introduction to pritikin education/ intensive cardiac rehab. Initial orientation packet reviewed with patient. 30 Day ITP Review. Rendell started cardiac rehab on 10/24/23 and did well with exercise. 30 Day ITP Review. Jah  has good attendance and participation in cardiac rehab 30 Day ITP Review. Jobie continues to have good attendance and participation in cardiac rehab 30 Day ITP Review. Isaiah continues to have good attendance  and participation in cardiac rehab. Emarion is tenatively scheduled to complete cardiac rehab on 01/14/24.  Comments: See ITP comments.Thayer Headings RN BSN

## 2024-01-12 ENCOUNTER — Encounter (HOSPITAL_COMMUNITY)
Admission: RE | Admit: 2024-01-12 | Discharge: 2024-01-12 | Disposition: A | Discharge status: Home / Self Care | Payer: Medicare Other | Source: Ambulatory Visit | Attending: Cardiovascular Disease | Admitting: Cardiovascular Disease

## 2024-01-12 DIAGNOSIS — Z955 Presence of coronary angioplasty implant and graft: Secondary | ICD-10-CM | POA: Diagnosis present

## 2024-01-14 ENCOUNTER — Encounter (HOSPITAL_COMMUNITY)
Admission: RE | Admit: 2024-01-14 | Discharge: 2024-01-14 | Disposition: A | Discharge status: Home / Self Care | Payer: Medicare Other | Source: Ambulatory Visit | Attending: Cardiovascular Disease

## 2024-01-14 ENCOUNTER — Other Ambulatory Visit: Payer: Self-pay | Admitting: Cardiovascular Disease

## 2024-01-14 DIAGNOSIS — Z955 Presence of coronary angioplasty implant and graft: Secondary | ICD-10-CM

## 2024-01-14 NOTE — Progress Notes (Signed)
 Discharge Progress Report  Patient Details  Name: Fred [name] MRN: [id number] Date of Birth: 08-19-1941 Referring Provider:   Flowsheet Row INTENSIVE CARDIAC REHAB ORIENT from 10/22/2023 in [facility]  Referring Provider Dorn Lesches, MD        Number of Visits: 72  Reason for Discharge:  Patient reached a stable level of exercise. Patient independent in their exercise. Patient has met program and personal goals.  Smoking History:  Social History   Tobacco Use  Smoking Status Former   Current packs/day: 0.00   Types: Cigarettes   Start date: 12/1952   Quit date: 12/1962   Years since quitting: 61.1  Smokeless Tobacco Never    Diagnosis:  09/18/23 DES SVG-OM  ADL UCSD:   Initial Exercise Prescription:  Initial Exercise Prescription - 10/22/23 1200       Date of Initial Exercise RX and Referring Provider   Date 10/22/23    Referring Provider Dorn Lesches, MD    Expected Discharge Date 01/14/24      NuStep   Level 1    SPM 70    Minutes 15    METs 2      Track   Laps 16    Minutes 15    METs 2      Prescription Details   Frequency (times per week) 3    Duration Progress to 30 minutes of continuous aerobic without signs/symptoms of physical distress      Intensity   THRR 40-80% of Max Heartrate 55-110    Ratings of Perceived Exertion 11-13    Perceived Dyspnea 0-4      Progression   Progression Continue to progress workloads to maintain intensity without signs/symptoms of physical distress.      Resistance Training   Training Prescription Yes    Weight 3    Reps 10-15             Discharge Exercise Prescription (Final Exercise Prescription Changes):  Exercise Prescription Changes - 01/14/24 1500       Response to Exercise   Blood Pressure (Admit) 110/58    Blood Pressure (Exit) 106/60    Heart Rate (Admit) 63 bpm    Heart Rate (Exercise) 94 bpm    Heart Rate (Exit)  72 bpm    Rating of Perceived Exertion (Exercise) 11    Symptoms None    Comments Pt graduated from the CRP2 program today    Duration Continue with 30 min of aerobic exercise without signs/symptoms of physical distress.    Intensity THRR unchanged      Progression   Progression Continue to progress workloads to maintain intensity without signs/symptoms of physical distress.    Average METs 2.84      Resistance Training   Training Prescription No    Weight No weights on Wednesdays      Interval Training   Interval Training No      NuStep   Level 4    SPM 111    Minutes 15    METs 2.5      Track   Laps 17    Minutes 15    METs 3.17      Home Exercise Plan   Plans to continue exercise at Home (comment)    Frequency Add 2 additional days to program exercise sessions.    Initial Home Exercises Provided 12/17/23  Functional Capacity:  6 Minute Walk     Row Name 10/22/23 1214 01/09/24 1302       6 Minute Walk   Phase Initial Discharge    Distance 1350 feet 1530 feet    Distance % Change -- 13.33 %    Distance Feet Change -- 180 ft    Walk Time 6 minutes --    # of Rest Breaks 0 0    MPH 2.56 2.9    METS 1.89 2.5    RPE 11 12    Perceived Dyspnea  0 0    VO2 Peak 6.61 8.72    Symptoms No No    Resting HR 72 bpm 75 bpm    Resting BP 102/60 110/60    Resting Oxygen Saturation  98 % --    Exercise Oxygen Saturation  during 6 min walk 98 % --    Max Ex. HR 81 bpm 101 bpm    Max Ex. BP 114/62 124/62    2 Minute Post BP 100/54 --             Psychological, QOL, Others - Outcomes: PHQ 2/9:    01/14/2024    1:13 PM 10/22/2023   11:11 AM 03/20/2020    8:53 AM 01/06/2020   10:27 AM  Depression screen PHQ 2/9  Decreased Interest 0 0 0 0  Down, Depressed, Hopeless 0 0 0 0  PHQ - 2 Score 0 0 0 0  Altered sleeping 0 0    Tired, decreased energy 1 0    Change in appetite 0 0    Feeling bad or failure about yourself  0 0    Trouble  concentrating 0 0    Moving slowly or fidgety/restless 0 0    Suicidal thoughts 0 0    PHQ-9 Score 1 0    Difficult doing work/chores Not difficult at all Not difficult at all      Quality of Life:  Quality of Life - 01/13/24 1522       Quality of Life Scores   Health/Function Pre 27.43 %    Health/Function Post 24.23 %    Health/Function % Change -11.67 %    Socioeconomic Pre 26.25 %    Socioeconomic Post 28.29 %    Socioeconomic % Change  7.77 %    Psych/Spiritual Pre 29.64 %    Psych/Spiritual Post 28 %    Psych/Spiritual % Change -5.53 %    Family Pre 28 %    Family Post 20.4 %    Family % Change -27.14 %    GLOBAL Pre 27.77 %    GLOBAL Post 25.2 %    GLOBAL % Change -9.25 %             Personal Goals: Goals established at orientation with interventions provided to work toward goal.  Personal Goals and Risk Factors at Admission - 10/22/23 1114       Core Components/Risk Factors/Patient Goals on Admission    Weight Management Yes;Weight Loss    Intervention Weight Management: Develop a combined nutrition and exercise program designed to reach desired caloric intake, while maintaining appropriate intake of nutrient and fiber, sodium and fats, and appropriate energy expenditure required for the weight goal.;Weight Management: Provide education and appropriate resources to help participant work on and attain dietary goals.    Expected Outcomes Short Term: Continue to assess and modify interventions until short term weight is achieved;Long Term: Adherence to nutrition and physical activity/exercise  program aimed toward attainment of established weight goal;Weight Loss: Understanding of general recommendations for a balanced deficit meal plan, which promotes 1-2 lb weight loss per week and includes a negative energy balance of [phone] kcal/d;Understanding of distribution of calorie intake throughout the day with the consumption of 4-5 meals/snacks;Understanding  recommendations for meals to include 15-35% energy as protein, 25-35% energy from fat, 35-60% energy from carbohydrates, less than 200mg  of dietary cholesterol, 20-35 gm of total fiber daily    Diabetes Yes    Intervention Provide education about signs/symptoms and action to take for hypo/hyperglycemia.;Provide education about proper nutrition, including hydration, and aerobic/resistive exercise prescription along with prescribed medications to achieve blood glucose in normal ranges: Fasting glucose 65-99 mg/dL    Expected Outcomes Short Term: Participant verbalizes understanding of the signs/symptoms and immediate care of hyper/hypoglycemia, proper foot care and importance of medication, aerobic/resistive exercise and nutrition plan for blood glucose control.;Long Term: Attainment of HbA1C < 7%.    Hypertension Yes    Intervention Provide education on lifestyle modifcations including regular physical activity/exercise, weight management, moderate sodium restriction and increased consumption of fresh fruit, vegetables, and low fat dairy, alcohol moderation, and smoking cessation.;Monitor prescription use compliance.    Expected Outcomes Short Term: Continued assessment and intervention until BP is < 140/2mm HG in hypertensive participants. < 130/63mm HG in hypertensive participants with diabetes, heart failure or chronic kidney disease.;Long Term: Maintenance of blood pressure at goal levels.    Lipids Yes    Intervention Provide education and support for participant on nutrition & aerobic/resistive exercise along with prescribed medications to achieve LDL 70mg , HDL >40mg .    Expected Outcomes Short Term: Participant states understanding of desired cholesterol values and is compliant with medications prescribed. Participant is following exercise prescription and nutrition guidelines.;Long Term: Cholesterol controlled with medications as prescribed, with individualized exercise RX and with personalized  nutrition plan. Value goals: LDL < 70mg , HDL > 40 mg.              Personal Goals Discharge:  Goals and Risk Factor Review     Row Name 10/24/23 1701 11/19/23 1635 12/16/23 0835 01/09/24 1023       Core Components/Risk Factors/Patient Goals Review   Personal Goals Review Weight Management/Obesity;Hypertension;Lipids;Diabetes Weight Management/Obesity;Hypertension;Lipids;Diabetes Weight Management/Obesity;Hypertension;Lipids;Diabetes Weight Management/Obesity;Hypertension;Lipids;Diabetes    Review Marquavius started cardiac rehab on 10/24/23. Nichael did well with exercise. Vital signs were stable. Obediah has been doing well with exercise. Vital signs have been  stable Since blood pressure medications have been adjusted. Sava has lost 1 kg since starting cardiac rehab. Nyzir continues to do  well with exercise. Vital signs and CBG's  remain stable. Haydn has lost 1 kg since starting cardiac rehab. Jakari continues to do  well with exercise. Vital signs and CBG's  remain stable. Daymian has lost 2.3 kg since starting cardiac rehab.    Expected Outcomes Geral will continue to participate in cardiac rehab for exercise, nutrition and lifestyle modifications Blanche will continue to participate in cardiac rehab for exercise, nutrition and lifestyle modifications Mikhail will continue to participate in cardiac rehab for exercise, nutrition and lifestyle modifications Trestan will continue to participate in cardiac rehab for exercise, nutrition and lifestyle modifications             Exercise Goals and Review:  Exercise Goals     Row Name 10/22/23 1111             Exercise Goals   Increase Physical Activity Yes  Intervention Provide advice, education, support and counseling about physical activity/exercise needs.;Develop an individualized exercise prescription for aerobic and resistive training based on initial evaluation findings, risk stratification, comorbidities and participant's personal goals.        Expected Outcomes Short Term: Attend rehab on a regular basis to increase amount of physical activity.;Long Term: Exercising regularly at least 3-5 days a week.;Long Term: Add in home exercise to make exercise part of routine and to increase amount of physical activity.       Increase Strength and Stamina Yes       Intervention Provide advice, education, support and counseling about physical activity/exercise needs.;Develop an individualized exercise prescription for aerobic and resistive training based on initial evaluation findings, risk stratification, comorbidities and participant's personal goals.       Expected Outcomes Long Term: Improve cardiorespiratory fitness, muscular endurance and strength as measured by increased METs and functional capacity ( );Short Term: Increase workloads from initial exercise prescription for resistance, speed, and METs.;Short Term: Perform resistance training exercises routinely during rehab and add in resistance training at home       Able to understand and use rate of perceived exertion (RPE) scale Yes       Intervention Provide education and explanation on how to use RPE scale       Expected Outcomes Short Term: Able to use RPE daily in rehab to express subjective intensity level;Long Term:  Able to use RPE to guide intensity level when exercising independently       Knowledge and understanding of Target Heart Rate Range (THRR) Yes       Intervention Provide education and explanation of THRR including how the numbers were predicted and where they are located for reference       Expected Outcomes Short Term: Able to state/look up THRR;Short Term: Able to use daily as guideline for intensity in rehab;Long Term: Able to use THRR to govern intensity when exercising independently       Understanding of Exercise Prescription Yes       Intervention Provide education, explanation, and written materials on patient's individual exercise prescription       Expected  Outcomes Short Term: Able to explain program exercise prescription;Long Term: Able to explain home exercise prescription to exercise independently                Exercise Goals Re-Evaluation:  Exercise Goals Re-Evaluation     Row Name 10/24/23 1347 11/28/23 1500 12/31/23 1500 01/14/24 1500       Exercise Goal Re-Evaluation   Exercise Goals Review Increase Physical Activity;Increase Strength and Stamina;Able to understand and use rate of perceived exertion (RPE) scale Increase Physical Activity;Increase Strength and Stamina;Able to understand and use rate of perceived exertion (RPE) scale;Knowledge and understanding of Target Heart Rate Range (THRR);Understanding of Exercise Prescription Increase Physical Activity;Increase Strength and Stamina;Able to understand and use rate of perceived exertion (RPE) scale;Knowledge and understanding of Target Heart Rate Range (THRR);Understanding of Exercise Prescription Increase Physical Activity;Increase Strength and Stamina;Able to understand and use rate of perceived exertion (RPE) scale;Knowledge and understanding of Target Heart Rate Range (THRR);Understanding of Exercise Prescription    Comments Yishai was able to understand and use RPE scale appropriately. Reviewed METs and goals. Pt voices he is making progress on his goal of increased strength and stamina. Peak METs to date are 3.5. Reviewed METs and goals. Pt voices he is making progress on his goal of increased strength and stamina. Peak METs to date are 3.5. We discussed  home exercise again, pt has not been walking on off days due to the cold weather. Discussed walking in the house or going somewhere indoors he can walk. Stressed concept of 150 minutes of exercise per week. Pt graduated from the CRP2 program today. Pt voces that he made progress on his goal of increased strength and stamina. Pt plans to continue his exercise by walking at home. Peak METs were 3.5.    Expected Outcomes Progress  workloads as tolerated to help increase strength and stamina. Continue to progress workloads as tolerated to help increase strength and stamina. Continue to progress workloads as tolerated to help increase strength and stamina. Pt plans s to continue his exercise at home by walking.             Nutrition & Weight - Outcomes:  Pre Biometrics - 10/22/23 1038       Pre Biometrics   Waist Circumference 44 inches    Hip Circumference 43.5 inches    Waist to Hip Ratio 1.01 %    Triceps Skinfold 8 mm    % Body Fat 28.2 %    Grip Strength 28 kg    Flexibility 0 in   could not reach   Single Leg Stand 12.87 seconds             Post Biometrics - 01/09/24 1310        Post  Biometrics   Height 5' 7 (1.702 m)    Weight 82.5 kg    Waist Circumference 42.25 inches    Hip Circumference 43 inches    Waist to Hip Ratio 0.98 %    BMI (Calculated) 28.48    Triceps Skinfold 8 mm    % Body Fat 27 %    Grip Strength 34 kg    Flexibility 11 in    Single Leg Stand 4.35 seconds             Nutrition:  Nutrition Therapy & Goals - 10/24/23 1451       Nutrition Therapy   Diet Heart healthy diet    Drug/Food Interactions Statins/Certain Fruits      Personal Nutrition Goals   Nutrition Goal Patient to identify strategies for reducing cardiovascular risk by attending the [employer] education and nutrition series weekly.    Personal Goal #2 Patient to improve diet quality by using the plate method as a guide for meal planning to include lean protein/plant protein, fruits, vegetables, whole grains, nonfat dairy as part of a well-balanced diet.    Comments Patient has medical history of CAD, DM2, Hyperlipidemia, s/p CABGx4, His A1c remains above goal at 7.3. HIs LDl remains well controlled but triglycerides are elevated. Patient will benefit from participation in intensive cardiac rehab for nutrition, exercise, and lifestyle modification.      Intervention Plan   Intervention Prescribe,  educate and counsel regarding individualized specific dietary modifications aiming towards targeted core components such as weight, hypertension, lipid management, diabetes, heart failure and other comorbidities.;Nutrition handout(s) given to patient.    Expected Outcomes Short Term Goal: Understand basic principles of dietary content, such as calories, fat, sodium, cholesterol and nutrients.;Long Term Goal: Adherence to prescribed nutrition plan.             Nutrition Discharge:  Nutrition Assessments - 10/31/23 1528       Rate Your Plate Scores   Pre Score 59             Education Questionnaire Score:  Knowledge Questionnaire Score -  01/13/24 1523       Knowledge Questionnaire Score   Post Score 22/24             Goals reviewed with patient; copy given to patient.Pt graduates from  Intensive/Traditional cardiac rehab program on 01/14/24 with completion of  28 exercise and 28 education sessions. Pt maintained good attendance and progressed nicely during their participation in rehab as evidenced by increased MET level. Vu increased his distance on his post exercise walk test by 180 feet and lost 3.6 kg.   Medication list reconciled. Repeat  PHQ score- 1.  Pt has made significant lifestyle changes and should be commended for their success. Glenroy achieved his  goals during cardiac rehab.   Pt plans to continue exercise at home walking. We are proud of Peretz's progress.Hadassah Elpidio Quan RN BSN

## 2024-02-09 ENCOUNTER — Other Ambulatory Visit: Payer: Self-pay

## 2024-02-09 MED ORDER — FARXIGA 10 MG PO TABS: 10.0000 mg | ORAL_TABLET | Freq: Every day | ORAL | 3 refills | Status: AC

## 2024-02-25 LAB — LIPID PANEL
Chol/HDL Ratio: 2.8 ratio (ref 0.0–5.0)
Cholesterol, Total: 92 mg/dL — ABNORMAL LOW (ref 100–199)
HDL: 33 mg/dL — ABNORMAL LOW (ref >39)
LDL Chol Calc (NIH): 36 mg/dL (ref 0–99)
Triglycerides: 127 mg/dL (ref 0–149)
VLDL Cholesterol Cal: 23 mg/dL (ref 5–40)

## 2024-03-01 ENCOUNTER — Other Ambulatory Visit: Payer: Self-pay

## 2024-03-01 DIAGNOSIS — E1165 Type 2 diabetes mellitus with hyperglycemia: Secondary | ICD-10-CM

## 2024-03-01 MED ORDER — ACCU-CHEK FASTCLIX LANCETS MISC: 3 refills | Status: AC

## 2024-03-18 ENCOUNTER — Telehealth: Payer: Self-pay | Admitting: Internal Medicine

## 2024-03-18 NOTE — Telephone Encounter (Signed)
 Patient came in to office and brought in [employer] Patient Assistance Program Application to be completed.  The application is in Dr. Harvel Ricks folder in the front office.

## 2024-03-25 ENCOUNTER — Telehealth: Payer: Self-pay

## 2024-03-25 NOTE — Telephone Encounter (Signed)
 Patient assistance forms were completed and faxed

## 2024-03-30 ENCOUNTER — Other Ambulatory Visit: Payer: Self-pay | Admitting: Internal Medicine

## 2024-04-09 ENCOUNTER — Telehealth: Payer: Self-pay

## 2024-04-09 NOTE — Telephone Encounter (Signed)
 Patient picked up sample - Ozempic 

## 2024-04-09 NOTE — Telephone Encounter (Signed)
 Medication Samples have been provided to the patient.  Drug name: Ozempic         Strength: 0.5mg         Qty: 1 box   LOT: [id number]  Exp.Date: 11/07/26  Dosing instructions: Inject 2 shots once weekly to equally the 1mg  dose.   The patient has been instructed regarding the correct time, dose, and frequency of taking this medication, including desired effects and most common side effects.   Valera Vallas L Shenika Quint 8:16 AM 04/09/2024   Patient will come by to pick up sample

## 2024-04-23 NOTE — Telephone Encounter (Signed)
Patient came in to office today and picked up 4 boxes of patient assistance Ozempic.

## 2024-06-15 ENCOUNTER — Ambulatory Visit (INDEPENDENT_AMBULATORY_CARE_PROVIDER_SITE_OTHER): Payer: Medicare Other | Admitting: Internal Medicine

## 2024-06-15 ENCOUNTER — Encounter: Payer: Self-pay | Admitting: Internal Medicine

## 2024-06-15 VITALS — BP 120/78 | HR 68 | Ht 67.0 in | Wt 179.0 lb

## 2024-06-15 DIAGNOSIS — Z794 Long term (current) use of insulin: Secondary | ICD-10-CM

## 2024-06-15 DIAGNOSIS — E039 Hypothyroidism, unspecified: Secondary | ICD-10-CM | POA: Diagnosis not present

## 2024-06-15 DIAGNOSIS — G63 Polyneuropathy in diseases classified elsewhere: Secondary | ICD-10-CM | POA: Insufficient documentation

## 2024-06-15 DIAGNOSIS — E1142 Type 2 diabetes mellitus with diabetic polyneuropathy: Secondary | ICD-10-CM | POA: Diagnosis not present

## 2024-06-15 DIAGNOSIS — E1159 Type 2 diabetes mellitus with other circulatory complications: Secondary | ICD-10-CM

## 2024-06-15 LAB — POCT GLYCOSYLATED HEMOGLOBIN (HGB A1C): Hemoglobin A1C: 6.9 % — AB (ref 4.0–5.6)

## 2024-06-15 MED ORDER — TOUJEO MAX SOLOSTAR 300 UNIT/ML ~~LOC~~ SOPN: 40.0000 [IU] | PEN_INJECTOR | Freq: Every day | SUBCUTANEOUS | 6 refills | Status: AC

## 2024-06-15 MED ORDER — METFORMIN HCL 1000 MG PO TABS: 1000.0000 mg | ORAL_TABLET | Freq: Two times a day (BID) | ORAL | 3 refills | Status: AC

## 2024-06-15 MED ORDER — LEVOTHYROXINE SODIUM 25 MCG PO TABS: 25.0000 ug | ORAL_TABLET | Freq: Every day | ORAL | 3 refills | Status: AC

## 2024-06-15 MED ORDER — INSULIN PEN NEEDLE 32G X 4 MM MISC: 1.0000 | Freq: Every day | 3 refills | Status: AC

## 2024-06-15 NOTE — Patient Instructions (Signed)
-   Continue Toujeo  40 units daily  - Continue Ozempic  1 mg weekly - Continue Farxiga  10 mg daily with Breakfast  - Continue Metformin  1000 mg twice daily    HOW TO TREAT LOW BLOOD SUGARS (Blood sugar LESS THAN 70 MG/DL) Please follow the RULE OF 15 for the treatment of hypoglycemia treatment (when your (blood sugars are less than 70 mg/dL)   STEP 1: Take 15 grams of carbohydrates when your blood sugar is low, which includes:  3-4 GLUCOSE TABS  OR 3-4 OZ OF JUICE OR REGULAR SODA OR ONE TUBE OF GLUCOSE GEL    STEP 2: RECHECK blood sugar in 15 MINUTES STEP 3: If your blood sugar is still low at the 15 minute recheck --> then, go back to STEP 1 and treat AGAIN with another 15 grams of carbohydrates. -

## 2024-06-15 NOTE — Progress Notes (Signed)
 Name: Fred [name]  Age/ Sex: 83 y.o., male   MRN/ DOB: [id number], 08-May-1941     PCP: Arloa Elsie SAUNDERS, MD   Reason for Endocrinology Evaluation: Type 2 Diabetes Mellitus  Initial Endocrine Consultative Visit: 03/21/2020    PATIENT IDENTIFIER: Fred [name] is a 83 y.o. male with a past medical history of  HTN, T2DM,CAD and Dyslipidemia. The patient has followed with Endocrinology clinic since 03/21/2020 for consultative assistance with management of his diabetes.    DIABETIC HISTORY:  Mr. Debois was diagnosed with DM many years ago, he was initially on oral glycemic agents, but insulin  had to be added due to  persistent hyperglycemiaand. His hemoglobin A1c has ranged from 7.8% in 2011, peaking at 9.0% in 2020.  Started  Trulicity  01/2022  Switch to Ozempic  05/2023 due to shortage of supply  Thyroid  History :    He was noted to have an elevated TSH at 10.42 uIU/mL . He was started on LT-4 replacement in 02/2020.   SUBJECTIVE:   During the last visit (12/17/2023): A1c 7.2 %     Today (06/15/2024): [name] is here for a follow up on diabetes and hypothyroidism.   He checks his blood sugars multiple times daily through  dexcom. The patient has not had hypoglycemic episodes since the last clinic visit.   Denies nausea or vomiting  Has chronic constipation , on colace/ plus laxative  He does have episodes with uncomfortable tingling of the feet, but this is short-lived and does not interfere with daily activities    HOME DIABETES REGIMEN:  Farxiga  10 mg daily with Breakfast - through pt assistance  Toujeo  40 units daily  Metformin  1000 mg twice daily Ozempic  1 mg weekly - pt assistance      Statin: Yes ACE-I/ARB: no   CONTINUOUS GLUCOSE MONITORING RECORD INTERPRETATION    Dates of Recording: 6/25-06/15/2024  Sensor description:dexcom  Results statistics:   CGM use % of time 94  Average and SD 153/44  Time in range 75%  % Time Above 180 22  % Time above  250 3  % Time Below target 0   Glycemic patterns summary: BGs are optimal overnight and in between the meals  Hyperglycemic episodes  postprandial   Hypoglycemic episodes N/A  Overnight periods: optimal    DIABETIC COMPLICATIONS: Microvascular complications:   Mild tingling of feet Denies: CKD, retinopathy Last eye exam: Completed 2024   Macrovascular complications:  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), PCI/DES 2024 Denies: PVD, CVA  HISTORY:  Past Medical History:  Past Medical History:  Diagnosis Date   Coronary artery disease    Diabetes mellitus without complication (HCC)    High cholesterol    Vertigo    Past Surgical History:  Past Surgical History:  Procedure Laterality Date   CARDIAC CATHETERIZATION     CORONARY ARTERY BYPASS GRAFT N/A 10/12/2019   Procedure: CORONARY ARTERY BYPASS GRAFTING (CABG) x 4 (LIMA to LAD, SVG to OM, SVG to RAMUS INTERMEDIATE, SVG to PDA) with EVH from RIGHT GREATER SAPHENOUS VEIN and LEFT INTERNAL MAMMARY ARTERY HARVEST;  Surgeon: Fleeta Hanford Coy, MD;  Location: [facility];  Service: Open Heart Surgery;  Laterality: N/A;   CORONARY STENT INTERVENTION N/A 09/18/2023   Procedure: CORONARY STENT INTERVENTION;  Surgeon: Elmira Newman PARAS, MD;  Location: [facility] INVASIVE CV LAB;  Service: Cardiovascular;  Laterality: N/A;   FOOT SURGERY     KNEE SURGERY     LEFT HEART CATH AND CORONARY ANGIOGRAPHY N/A  10/11/2019   Procedure: LEFT HEART CATH AND CORONARY ANGIOGRAPHY;  Surgeon: Court Dorn PARAS, MD;  [facility] INVASIVE CV LAB;  Service: Cardiovascular;  Laterality: N/A;   LEFT HEART CATH AND CORS/GRAFTS ANGIOGRAPHY N/A 09/18/2023   Procedure: LEFT HEART CATH AND CORS/GRAFTS ANGIOGRAPHY;  Surgeon: Elmira Newman PARAS, MD;  Location: [facility] INVASIVE CV LAB;  Service: Cardiovascular;  Laterality: N/A;   NASAL SEPTUM SURGERY     TEE WITHOUT CARDIOVERSION N/A 10/12/2019   Procedure: TRANSESOPHAGEAL ECHOCARDIOGRAM (TEE);  Surgeon: Fleeta Ochoa, Maude, MD;  Location:  [facility] OR;  Service: Open Heart Surgery;  Laterality: N/A;   Social History:  reports that he quit smoking about 61 years ago. His smoking use included cigarettes. He started smoking about 71 years ago. He has never used smokeless tobacco. He reports that he does not drink alcohol and does not use drugs. Family History: No family history on file.   HOME MEDICATIONS: Allergies as of 06/15/2024   No Known Allergies      Medication List        Accurate as of June 15, 2024  8:16 AM. If you have any questions, ask your nurse or doctor.          Accu-Chek Aviva device by Other route. Use as instructed 1 time daily   Accu-Chek FastClix Lancets Misc USE 1 TO 4 TIMES DAILY AS  NEEDED/DIRECTED   Accu-Chek Guide Test test strip Generic drug: glucose blood USE TO CHECK BLOOD SUGAR DAILY  AS DIRECTED   acetaminophen  650 MG CR tablet Commonly known as: TYLENOL  Take 1,300 mg by mouth 2 (two) times daily.   aspirin  EC 81 MG tablet Take 1 tablet (81 mg total) by mouth daily.   clopidogrel  75 MG tablet Commonly known as: PLAVIX  Take 1 tablet (75 mg total) by mouth daily with breakfast.   clotrimazole 1 % cream Commonly known as: LOTRIMIN Apply 1 application  topically daily as needed (Groin).   Dexcom G6 Sensor Misc by Does not apply route.   docusate sodium  50 MG capsule Commonly known as: COLACE Take 100 mg by mouth 2 (two) times daily.   Farxiga  10 MG Tabs tablet Generic drug: dapagliflozin  propanediol Take 1 tablet (10 mg total) by mouth daily before breakfast.   Insulin  Pen Needle 32G X 4 MM Misc 1 Device by Does not apply route daily.   levothyroxine  25 MCG tablet Commonly known as: SYNTHROID  Take 1 tablet (25 mcg total) by mouth daily before breakfast.   metFORMIN  1000 MG tablet Commonly known as: GLUCOPHAGE  Take 1 tablet (1,000 mg total) by mouth 2 (two) times daily with a meal.   multivitamin with minerals tablet Take 1 tablet by mouth daily.   nitroGLYCERIN   0.4 MG SL tablet Commonly known as: NITROSTAT  Place 1 tablet (0.4 mg total) under the tongue every 5 (five) minutes as needed for chest pain.   omega-3 acid ethyl esters 1 g capsule Commonly known as: Lovaza  Take 1 capsule (1 g total) by mouth 2 (two) times daily.   rosuvastatin  20 MG tablet Commonly known as: Crestor  Take 1 tablet (20 mg total) by mouth daily.   Semaglutide  (1 MG/DOSE) 4 MG/3ML Sopn Inject 1 mg as directed once a week.   solifenacin 10 MG tablet Commonly known as: VESICARE Take 10 mg by mouth daily.   tamsulosin 0.4 MG Caps capsule Commonly known as: FLOMAX Take 0.4 mg by mouth daily.   terbinafine 250 MG tablet Commonly known as: LAMISIL Take 250 mg by  mouth daily.   Toujeo  Max SoloStar 300 UNIT/ML Solostar Pen Generic drug: insulin  glargine (2 Unit Dial) Inject 40 Units into the skin daily.         OBJECTIVE:   Vital Signs: BP 120/78 (BP Location: Left Arm, Patient Position: Sitting, Cuff Size: Normal)   Pulse 68   Ht 5' 7 (1.702 m)   Wt 179 lb (81.2 kg)   SpO2 98%   BMI 28.04 kg/m   Wt Readings from Last 3 Encounters:  06/15/24 179 lb (81.2 kg)  01/09/24 181 lb 14.1 oz (82.5 kg)  12/17/23 182 lb (82.6 kg)    Exam: General: Pt appears well and is in NAD  Lungs: Clear with good BS bilat   Heart: RRR   Extremities: trace pretibial edema.   Neuro: MS is good with appropriate affect, pt is alert and Ox3    DM foot exam: 12/17/2023 The skin of the feet is without sores or ulcerations, plantar callous formation noted The pedal pulses are 1+ on right and 1+ on left. The sensation is intact  to a screening 5.07, 10 gram monofilament bilaterally   DATA REVIEWED:    Latest Reference Range & Units 02/25/24 08:56  Total CHOL/HDL Ratio 0.0 - 5.0 ratio 2.8  Cholesterol, Total 100 - 199 mg/dL 92 (L)  HDL Cholesterol >39 mg/dL 33 (L)  Triglycerides 0 - 149 mg/dL 872  VLDL Cholesterol Cal 5 - 40 mg/dL 23  LDL Chol Calc (NIH) 0 - 99 mg/dL 36   (L): Data is abnormally low   ASSESSMENT / PLAN / RECOMMENDATIONS:   1) Type 2 Diabetes Mellitus, OPtimally controlled, With Neuropathic and  Macrovascular  complications - Most recent A1c of 6.9%. Goal A1c < 7.5 %.    -A1c is optimal -He is on patient assistance for Farxiga  and Ozempic  -No changes at this time - Patient encouraged to incorporate walking exercises into daily routine  MEDICATIONS: Continue Farxiga  10 mg daily  Continue Toujeo  40 units daily  Continue Metformin  1000 mg BID  Continue Ozempic  1 mg weekly  EDUCATION / INSTRUCTIONS: BG monitoring instructions: Patient is instructed to check his blood sugars 2 times a day, fasting and bedtime . Call [facility] if: BG persistently < 70  I reviewed the Rule of 15 for the treatment of hypoglycemia in detail with the patient. Literature supplied.   2) Diabetic complications:  Eye: Does not have known diabetic retinopathy.  Neuro/ Feet: Does  have known diabetic peripheral neuropathy .  Renal: Patient does not have known baseline CKD. He   is not on an ACEI/ARB at present. MA/Cr ratio is normal 02/2022)  2) Hypothyroidism:  -Patient with fatigue that he attributes to cardiovascular disease - TFTs have been normal - No change  Medication  Continue levothyroxine  25 mcg daily      4) Dyslipidemia/CAD:  - Per cardiology   5) Peripheral neuropathy:   - Symptoms are sporadic and tolerable - We entertain the idea of gabapentin  if symptoms worsen as the risk outweighs the benefit at this time     F/U in 6 months     Signed electronically by: Stefano Redgie Butts, MD  [facility] Endocrinology  [facility] [address] [place], [place] [address] Phone: [phone] FAX: [phone]   CC: Arloa Elsie SAUNDERS, MD [address] [place] [address] Phone: [phone]  Fax: [phone]  Return to Endocrinology clinic as below: Future  Appointments  Date Time Provider  Department Center  06/17/2024  9:15 AM Meng, Hao, PA [facility]

## 2024-06-17 ENCOUNTER — Ambulatory Visit: Attending: Physician Assistant | Admitting: Physician Assistant

## 2024-06-17 VITALS — BP 110/56 | HR 65 | Ht 67.0 in | Wt 177.4 lb

## 2024-06-17 DIAGNOSIS — E785 Hyperlipidemia, unspecified: Secondary | ICD-10-CM | POA: Diagnosis not present

## 2024-06-17 DIAGNOSIS — E119 Type 2 diabetes mellitus without complications: Secondary | ICD-10-CM

## 2024-06-17 DIAGNOSIS — I25810 Atherosclerosis of coronary artery bypass graft(s) without angina pectoris: Secondary | ICD-10-CM

## 2024-06-17 NOTE — Patient Instructions (Signed)
 Medication Instructions:  NO CHANGES *If you need a refill on your cardiac medications before your next appointment, please call your pharmacy*  Lab Work: NO LABS If you have labs (blood work) drawn today and your tests are completely normal, you will receive your results only by: MyChart Message (if you have MyChart) OR A paper copy in the mail If you have any lab test that is abnormal or we need to change your treatment, we will call you to review the results.  Testing/Procedures: NO TESTING  Follow-Up: At [facility], you and your health needs are our priority.  As part of our continuing mission to provide you with exceptional heart care, our providers are all part of one team.  This team includes your primary Cardiologist (physician) and Advanced Practice Providers or APPs (Physician Assistants and Nurse Practitioners) who all work together to provide you with the care you need, when you need it.  Your next appointment:   6 month(s)  Provider:   Lauro Portal, MD

## 2024-06-17 NOTE — Progress Notes (Signed)
 Cardiology Office Note   Date:  06/17/2024  ID:  JULES BATY, DOB 06/25/1941, MRN [id number] PCP: Arloa Elsie SAUNDERS, MD  [facility] Providers Cardiologist:  Dorn Lesches, MD     History of Present Illness DAYVION SANS is a 83 y.o. male with PMH of dizziness, CAD, hyperlipidemia and DM II.  He has family history of heart disease with mother who had a CABG and a younger brother who died.  Previous heart monitor obtained on 10/12/2019 showed sinus rhythm, occasional PAC and PVCs, occasional run of PSVT.  Coronary CTA obtained in October 2020 demonstrated three-vessel CAD.  Echocardiogram obtained on 10/06/2019 showed EF 55 to 60%, grade 1 DD, no significant valve issue.  Subsequent cardiac catheterization performed on 10/11/2019 confirmed the left main and three-vessel disease.  He underwent 4 vessel CABG by Dr. Fleeta Ochoa with LIMA to LAD, SVG to ramus, OM and PDA.     I saw the patient on 08/22/2023 at which time he described vague chest discomfort.  EKG however showed T wave inversion in the septal lead and new Q waves in the inferior lead.  We proceeded with a PET stress test and echocardiogram.  Echocardiogram obtained on 08/26/2023 showed EF 55 to 60%, no regional wall motion abnormality, grade 1 DD, mild to moderate TR, mild to moderate AI.  PET stress test obtained on 09/09/2023 showed a large defect with moderate reduction in uptake present in the mid to basal anterior lateral location which was reversible, consistent with ischemia, EF 44%. Cardiac catheterization performed on 09/18/2023 revealed 80% distal left main lesion, 90% ostial to proximal LAD lesion, 50% ramus lesion, 95% proximal left circumflex lesion, 70% proximal RCA lesion, patent LIMA to LAD, patent SVG to PDA with diffuse 60% disease in proximal PDA after vein graft insertion, ostially occluded SVG to ramus, 95% proximal SVG to OM lesion treated with 4.0 x 12 mm Synergy DES.    He had a hypotensive episode at cardiac  rehab in October 2024.  Patient presents today for follow-up.  He is doing well from the cardiac perspective.  He says he was able to obtain his deck for about 1.5 hours before he got tired.  Cholesterol has been very well-controlled based on last blood work in March 2025.  His biggest concern is fatigue if he does too much work.  However I suspect the patient has some degree of deconditioning as he does not do much strenuous activity.  Prior to bypass surgery he used to run, however he has not run in the past 4 years.  I encouraged him to build up exercise tolerance.  He denies any chest pain.  He can follow-up with Dr. Lesches in 6 months.  ROS:   Patient complains of fatigue with the activity, but no significant shortness of breath and chest discomfort.  He has no new extremity edema, apnea or PND  Studies Reviewed      Cardiac Studies & Procedures   ______________________________________________________________________________________________ CARDIAC CATHETERIZATION  CARDIAC CATHETERIZATION 09/18/2023  Conclusion Images from the original result were not included. Coronary and bypass graft angiography, SVG-OM intervention 09/18/2023: LM: Distal 80% stenosis LAD: Ostial proximal 90% stenosis, mid occlusion Ramus: Small caliber vessel, proximal 50% disease LCx: Proximal 95-90% tandem stenoses RCA: 70% proximal ISR, followed by occlusion  LIMA-LAD: Patent Mid to distal LAD has mild diffuse disease after LIMA-LAD insertion SVG-PDA: Patent, 20% proximal disease Proximal PDA has diffuse 60% disease after SVG-PDA insertion  SVG-OM: Patent,  20% proximal  disease, followed by 95% proximal stenosis OM has 50% stenosis after SVG-OM insertion SVG-ramus: Ostially occluded  Successful percutaneous coronary intervention SVG-OM Direct stenting with 4.0 X 12 mm Synergy drug-eluting stent using Spider Fx 5.0 embolic protection Post dilatation using 4.0 x 8 mm [place] balloon at 14 atm    Manish JINNY Lawrence, MD  Findings Coronary Findings Diagnostic  Dominance: Right  Left Main Mid LM to Dist LM lesion is 80% stenosed. The lesion is moderately calcified.  Left Anterior Descending Ost LAD to Prox LAD lesion is 90% stenosed. The lesion is calcified.  Left Circumflex Ost Cx to Prox Cx lesion is 95% stenosed. Prox Cx lesion is 90% stenosed.  First Obtuse Marginal Branch 1st Mrg lesion is 50% stenosed.  Right Coronary Artery Ost RCA to Prox RCA lesion is 70% stenosed. The lesion was previously treated over 2 years ago. Prox RCA lesion is 100% stenosed.  Right Posterior Descending Artery RPDA lesion is 60% stenosed.  LIMA Graft To Mid LAD And is normal in caliber.  The graft exhibits no disease.  Graft To RPDA And is normal in caliber.  The graft exhibits no disease. Prox Graft lesion is 20% stenosed.  Graft To 1st Mrg And is normal in caliber. Prox Graft-1 lesion is 20% stenosed. Prox Graft-2 lesion is 95% stenosed.  Saphenous Graft To Ramus SVG. Prox Graft lesion is 100% stenosed.  Intervention  Prox Graft-2 lesion (Graft To 1st Mrg) Stent Pre-stent angioplasty was not performed. A drug-eluting stent was successfully placed. Post-stent angioplasty was performed. Post-Intervention Lesion Assessment The intervention was successful. Embolic protection device was deployed. Pre-interventional TIMI flow is 2. Post-intervention TIMI flow is 3. No complications occurred at this lesion. There is a 0% residual stenosis post intervention.   CARDIAC CATHETERIZATION  CARDIAC CATHETERIZATION 10/11/2019  Conclusion Images from the original result were not included.   Mid LM to Dist LM lesion is 80% stenosed.  Ost LAD to Prox LAD lesion is 90% stenosed.  Ost Cx to Prox Cx lesion is 90% stenosed.  Prox RCA to Mid RCA lesion is 90% stenosed.  CAROLE DEERE is a 83 y.o. male   [id number] LOCATION:  FACILITY: [facility] PHYSICIAN: Dorn Lesches,  M.D. 1941-04-02   DATE OF PROCEDURE:  10/11/2019  DATE OF DISCHARGE:     CARDIAC CATHETERIZATION    History obtained from chart review.NHAN QUALLEY is a 83 y.o.  mildly overweight weight married Caucasian male father of 2, grandfather of 4 grandchildren who is referred by the ER and Dr. Arloa for evaluation of dizziness.  He retired in 2000 from working at [employer] in [place].  I last saw him in the office 09/07/2019. His cardiac risk factors include treated diabetes and hyperlipidemia.  He does not smoke.  He does have a family history of heart disease with a mother that had CABG and a younger brother who died as well which was found in the evaluation of syncope.  He is never had a heart attack or stroke.  He denies chest pain or shortness of breath.  He is fairly active and routinely walked 3 miles a day up until COVID-19 when he became more sedentary however recently he is try to increase his activity.  On 08/30/2019 he went out for his morning run of 3 miles and at the end he felt weak.  When driving back home he thought that he may have passed out but he was dizzy.  He went to the ER  where he was nauseated as well.  ER evaluation was unrevealing.  He said he felt a little bit of chest discomfort this morning when coming to the office but otherwise specifically denied the symptoms.  I performed event monitoring which is still processing.  A coronary CTA did show three-vessel disease.  Based on this, I recommended that we proceed with outpatient diagnostic coronary angiography.  Impression Mr. Erway has left main/three-vessel disease with preserved LV function by 2D echo.  He has surgical anatomy.  Given the severe nature of his blockage I favor keeping him for CABG.  T CTS has been consulted.  The sheath was removed and a TR band was placed on the right wrist to achieve patent hemostasis.  The patient left the lab in stable condition.  Dorn Lesches. MD,  [facility] 10/11/2019 11:53 AM  Findings Coronary Findings Diagnostic  Dominance: Right  Left Main Mid LM to Dist LM lesion is 80% stenosed. The lesion is moderately calcified.  Left Anterior Descending Ost LAD to Prox LAD lesion is 90% stenosed. The lesion is calcified.  Left Circumflex Ost Cx to Prox Cx lesion is 90% stenosed.  Right Coronary Artery Prox RCA to Mid RCA lesion is 90% stenosed. The lesion is calcified.  Intervention  No interventions have been documented.   STRESS TESTS  NM PET CT CARDIAC PERFUSION MULTI W/ABSOLUTE BLOODFLOW 09/09/2023  Narrative   LV perfusion is abnormal. Defect 1: There is a large defect with moderate reduction in uptake present in the mid to basal anterolateral location(s) that is reversible. There is normal wall motion in the defect area. Consistent with ischemia.   Rest left ventricular function is abnormal. Rest global function is mildly reduced. There were no regional wall motion abnormalities. Rest EF: 44%. Stress left ventricular function is abnormal. Stress global function is mildly reduced. There were no regional wall abnormalities. Stress EF: 50%. End diastolic cavity size is mildly enlarged. End systolic cavity size is mildly enlarged.   Myocardial blood flow reserve is not reported in this patient due to technical or patient-specific concerns that affect accuracy. Global MBFR abnormal 1.73 but may not be accurate in setting of prior CABG   Coronary calcium  assessment not performed due to prior revascularization. Not commented on in setting of prior CABG   Findings are consistent with ischemia. The study is intermediate risk.  EXAM: OVER-READ INTERPRETATION  CT CHEST  The following report is a limited chest CT over-read performed by radiologist Dr. Elsie Ko [facility] on 09/09/2023. This over-read does not include interpretation of cardiac or coronary anatomy or pathology nor does it include evaluation of the PET  data. The cardiac PET-CT interpretation by the cardiologist is attached.  COMPARISON:  Chest CT 04/12/2021  FINDINGS: Mediastinum/Nodes: No enlarged lymph nodes within the visualized mediastinum.Aortic atherosclerosis noted post median sternotomy and CABG.  Lungs/Pleura: There is no pleural effusion. Mild central airway thickening. The lungs are otherwise clear without confluent airspace disease or suspicious nodularity.  Upper abdomen: No significant findings in the visualized upper abdomen.  Musculoskeletal/Chest wall: Chronic nonunion of the median sternotomy. The sternotomy wires are intact. No acute osseous findings.  IMPRESSION: 1. No acute extracardiac findings. 2. Chronic nonunion of the median sternotomy. 3.  Aortic Atherosclerosis (ICD10-I70.0).   Electronically Signed By: Elsie Perone M.D. On: 09/09/2023 11:17   ECHOCARDIOGRAM  ECHOCARDIOGRAM COMPLETE 08/26/2023  Narrative ECHOCARDIOGRAM REPORT    Patient Name:   ESSA MALACHI Date of Exam: 08/26/2023 Medical Rec #:  [id number]       Height:       67.0 in Accession #:    [id number]      Weight:       186.6 lb Date of Birth:  1941-05-16       BSA:          1.964 m Patient Age:    82 years        BP:           102/52 mmHg Patient Gender: M               HR:           69 bpm. Exam Location:  [address]  Procedure: 3D Echo, 2D Echo, Cardiac Doppler and Color Doppler  Indications:    Chest Discomfort R07.89  History:        Patient has prior history of Echocardiogram examinations, most recent 10/06/2019. CAD, Prior CABG; Risk Factors:Diabetes.  Sonographer:    Augustin Seals RDCS Referring Phys: [id number] Nicholette Dolson  IMPRESSIONS   1. Left ventricular ejection fraction, by estimation, is 55 to 60%. Left ventricular ejection fraction by 3D volume is 57 %. The left ventricle has normal function. The left ventricle has no regional wall motion abnormalities. Left ventricular diastolic parameters are  consistent with Grade I diastolic dysfunction (impaired relaxation). 2. Right ventricular systolic function is normal. The right ventricular size is mildly enlarged. There is normal pulmonary artery systolic pressure. 3. The mitral valve is normal in structure. No evidence of mitral valve regurgitation. No evidence of mitral stenosis. 4. Tricuspid valve regurgitation is mild to moderate. 5. The aortic valve is normal in structure. Aortic valve regurgitation is mild to moderate. Aortic valve sclerosis/calcification is present, without any evidence of aortic stenosis. Aortic regurgitation PHT measures 387 msec. 6. The inferior vena cava is normal in size with greater than 50% respiratory variability, suggesting right atrial pressure of 3 mmHg.  FINDINGS Left Ventricle: Left ventricular ejection fraction, by estimation, is 55 to 60%. Left ventricular ejection fraction by 3D volume is 57 %. The left ventricle has normal function. The left ventricle has no regional wall motion abnormalities. The left ventricular internal cavity size was normal in size. There is no left ventricular hypertrophy. Left ventricular diastolic parameters are consistent with Grade I diastolic dysfunction (impaired relaxation).  Right Ventricle: The right ventricular size is mildly enlarged. No increase in right ventricular wall thickness. Right ventricular systolic function is normal. There is normal pulmonary artery systolic pressure. The tricuspid regurgitant velocity is 2.18 m/s, and with an assumed right atrial pressure of 3 mmHg, the estimated right ventricular systolic pressure is 22.0 mmHg.  Left Atrium: Left atrial size was normal in size.  Right Atrium: Right atrial size was normal in size.  Pericardium: There is no evidence of pericardial effusion.  Mitral Valve: The mitral valve is normal in structure. No evidence of mitral valve regurgitation. No evidence of mitral valve stenosis.  Tricuspid Valve: The tricuspid  valve is normal in structure. Tricuspid valve regurgitation is mild to moderate. No evidence of tricuspid stenosis.  Aortic Valve: The aortic valve is normal in structure. Aortic valve regurgitation is mild to moderate. Aortic regurgitation PHT measures 387 msec. Aortic valve sclerosis/calcification is present, without any evidence of aortic stenosis.  Pulmonic Valve: The pulmonic valve was normal in structure. Pulmonic valve regurgitation is not visualized. No evidence of pulmonic stenosis.  Aorta: The aortic root is normal in size and structure.  Venous: The  inferior vena cava is normal in size with greater than 50% respiratory variability, suggesting right atrial pressure of 3 mmHg.  IAS/Shunts: No atrial level shunt detected by color flow Doppler.   LEFT VENTRICLE PLAX 2D LVIDd:         4.70 cm         Diastology LVIDs:         3.10 cm         LV e' medial:    4.98 cm/s LV PW:         0.90 cm         LV E/e' medial:  9.4 LV IVS:        1.00 cm         LV e' lateral:   7.23 cm/s LVOT diam:     2.20 cm         LV E/e' lateral: 6.5 LV SV:         70 LV SV Index:   35 LVOT Area:     3.80 cm        3D Volume EF LV 3D EF:    Left ventricul ar ejection fraction by 3D volume is 57 %.  3D Volume EF: 3D EF:        57 % LV EDV:       133 ml LV ESV:       58 ml LV SV:        75 ml  RIGHT VENTRICLE RV Basal diam:  3.90 cm RV Mid diam:    4.10 cm RV S prime:     9.02 cm/s TAPSE (M-mode): 1.3 cm  LEFT ATRIUM           Index        RIGHT ATRIUM           Index LA diam:      3.60 cm 1.83 cm/m   RA Area:     16.40 cm LA Vol (A2C): 63.0 ml 32.09 ml/m  RA Volume:   45.70 ml  23.27 ml/m LA Vol (A4C): 43.3 ml 22.05 ml/m AORTIC VALVE LVOT Vmax:   85.40 cm/s LVOT Vmean:  56.200 cm/s LVOT VTI:    0.183 m AI PHT:      387 msec  AORTA Ao Root diam: 3.50 cm Ao Asc diam:  3.40 cm  MITRAL VALVE               TRICUSPID VALVE MV Area (PHT): 2.87 cm    TR Peak grad:   19.0  mmHg MV Decel Time: 264 msec    TR Vmax:        218.00 cm/s MV E velocity: 46.80 cm/s MV A velocity: 84.20 cm/s  SHUNTS MV E/A ratio:  0.56        Systemic VTI:  0.18 m Systemic Diam: 2.20 cm  Kardie Tobb DO Electronically signed by Dub Huntsman DO Signature Date/Time: 08/26/2023/11:09:55 AM    Final   TEE  ECHO INTRAOPERATIVE TEE 10/12/2019  Narrative *INTRAOPERATIVE TRANSESOPHAGEAL REPORT *    Patient Name:   SHLOMO SERES Date of Exam: 10/12/2019 Medical Rec #:  [id number]       Height:       67.0 in Accession #:    [id number]      Weight:       197.2 lb Date of Birth:  10-01-1941       BSA:          2.01 m  Patient Age:    78 years        BP:           130/70 mmHg Patient Gender: M               HR:           65 bpm. Exam Location:  Anesthesiology  Transesophogeal exam was perform intraoperatively during surgical procedure. Patient was closely monitored under general anesthesia during the entirety of examination.  Indications:     I25.110 Atherosclerotic heart disease of native coronary artery with unstable angina pectoris History:         CAD Signs/Symptoms:Chest Pain Risk Factors:Diabetes. Medications:Beta Blockers. Performing Phys: [id number] PETER [name] Diagnosing Phys: Debby Like MD  Complications: No known complications during this procedure. POST-OP IMPRESSIONS - Left Ventricle: The left ventricle is unchanged from pre-bypass. - Right Ventricle: The right ventricle appears unchanged from pre-bypass. - Aorta: The aorta appears unchanged from pre-bypass. - Left Atrium: The left atrium appears unchanged from pre-bypass. - Left Atrial Appendage: The left atrial appendage appears unchanged from pre-bypass. - Aortic Valve: The aortic valve appears unchanged from pre-bypass. - Mitral Valve: The mitral valve appears unchanged from pre-bypass. - Tricuspid Valve: The tricuspid valve appears unchanged from pre-bypass. - Interatrial Septum: The interatrial septum  appears unchanged from pre-bypass. - Interventricular Septum: The interventricular septum appears unchanged from pre-bypass. - Pericardium: The pericardium appears unchanged from pre-bypass.  PRE-OP FINDINGS Left Ventricle: The left ventricle has normal systolic function, with an ejection fraction of 55-60%. The cavity size was normal. There is no increase in left ventricular wall thickness.  Right Ventricle: The right ventricle has normal systolic function. The cavity was normal. There is no increase in right ventricular wall thickness.  Left Atrium: Left atrial size was normal in size. The left atrial appendage is well visualized and there is no evidence of thrombus present.  Right Atrium: Right atrial size was normal in size. Right atrial pressure is estimated at 10 mmHg.  Interatrial Septum: No atrial level shunt detected by color flow Doppler. Increased thickness of the atrial septum sparing the fossa ovalis consistent with The interatrial septum 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  Pericardium: There is no evidence of pericardial effusion.  Mitral Valve: The mitral valve is normal in structure. Mitral valve regurgitation is not visualized by color flow Doppler.  Tricuspid Valve: The tricuspid valve was normal in structure. Tricuspid valve regurgitation is trivial by color flow Doppler.  Aortic Valve: The aortic valve is tricuspid Aortic valve regurgitation is mild by color flow Doppler. There is no evidence of aortic valve stenosis.  Pulmonic Valve: The pulmonic valve was normal in structure. Pulmonic valve regurgitation is not visualized by color flow Doppler.   Aorta: The aortic root, ascending aorta and aortic arch are normal in size and structure.   Debby Like MD Electronically signed by Debby Like MD Signature Date/Time: 10/12/2019/1:40:11 PM    Final  MONITORS  LONG TERM MONITOR (3-14 DAYS) 10/12/2019  Narrative 1: Sinus rhythm/sinus bradycardia/sinus tachycardia 2:  Occasional PACs and PVCs 3: Occasional runs of PSVT   CT SCANS  CT CORONARY MORPH W/CTA COR W/SCORE 09/21/2019  Addendum 10/02/2019  9:52 AM ADDENDUM REPORT: 10/02/2019 09:50  EXAM: CT FFR ANALYSIS  CLINICAL DATA:  83 year old male with family history of CAD requiring CABG with abnormal coronary CT  FINDINGS: FFRct analysis was performed on the original cardiac CT angiogram dataset. Diagrammatic representation of the FFRct analysis is provided in a  separate PDF document in PACS. This dictation was created using the PDF document and an interactive 3D model of the results. 3D model is not available in the EMR/PACS. Normal FFR range is >0.80.  1. Left Main:  No significant stenosis.  2. LAD: There is significant flow limiting stenosis (FFR 0.68-0.64) approximately 8 mm distal to moderate sized first diagonal branch. 3. LCX: FFR is 0.79 (just below normal of 0.8) after the origin of left circumflex. 4. RCA: There is significant flow limiting stenosis of proximal RCA (FFR 0.66).  IMPRESSION: 1. CT FFR analysis demonstrates significant 3 vessel flow limiting CAD.   Electronically Signed By: Oneil Parchment MD On: 10/02/2019 09:50  Addendum 10/01/2019  4:41 PM ADDENDUM REPORT: 10/01/2019 16:39  EXAM: Cardiac/Coronary  CTA  TECHNIQUE: The patient was scanned on a [employer].  FINDINGS: A 100 kV prospective scan was triggered in the descending thoracic aorta at 111 HU's. Axial non-contrast 3 mm slices were carried out through the heart. The data set was analyzed on a dedicated work station and scored using the Agatson method. Gantry rotation speed was 250 msecs and collimation was .6 mm. 0.8 mg of sl NTG was given. The 3D data set was reconstructed in 5% intervals of the 67-82 % of the R-R cycle. Diastolic phases were analyzed on a dedicated work station using MPR, MIP and VRT modes. The patient received 80 cc of contrast.  Aorta:  Normal size.  No  calcifications.  No dissection.  Aortic Valve:  Trileaflet.  No calcifications.  Coronary Arteries:  Normal coronary origin.  Right dominance.  RCA is a large dominant artery that gives rise to PDA and PLA. There is dense calcified plaque throughout the proximal to mid vessel with proximal lesion up to 75% just following first area of proximal calcification.  Left main is a large artery that gives rise to LAD and LCX arteries.  LAD is a large vessel that gives rise to moderate size diagonal. There is proximal dense calcified plaque with small caliber mid and distal vessel. Potential severe lesion proximally.  LCX is a non-dominant artery that gives rise to one moderate sized OM1 branch. There is dense calcification of the proximal vessel. Unable to accurately estimate level of stenosis.  Other findings:  3 right sided and 2 left sided pulmonary veins drainage into the left atrium.  Normal left atrial appendage without a thrombus.  Normal size of the pulmonary artery.  IMPRESSION: 1. Coronary calcium  score of 1619. This was 7 percentile for age and sex matched control.  2. Normal coronary origin with right dominance.  3. 3 vessel coronary artery disease with what appears to be flow limiting stenosis in proximal LAD, RCA. Dense calcified plaque present. Unable to accurately estimate circumflex stenosis due to dense calcification proximally. CAD-RADS -4.  4.  No significant aortic atherosclerosis.  5. Recommend cardiac catheterization but prior to this will send for FFR analysis.   Electronically Signed By: Oneil Parchment MD On: 10/01/2019 16:39  Narrative EXAM: OVER-READ INTERPRETATION  CT CHEST  The following report is an over-read performed by radiologist Dr. Toribio Aye of [facility] on 09/21/2019. This over-read does not include interpretation of cardiac or coronary anatomy or pathology. The coronary calcium  score/coronary  CTA interpretation by the cardiologist is attached.  COMPARISON:  None.  FINDINGS: Within the visualized portions of the thorax there are no suspicious appearing pulmonary nodules or masses, there is no acute consolidative airspace disease, no pleural effusions, no  pneumothorax and no lymphadenopathy. Visualized portions of the upper abdomen are unremarkable. There are no aggressive appearing lytic or blastic lesions noted in the visualized portions of the skeleton.  IMPRESSION: 1. No significant incidental noncardiac findings are noted.  Electronically Signed: By: Toribio Aye M.D. On: 09/21/2019 16:22     ______________________________________________________________________________________________      Risk Assessment/Calculations           Physical Exam VS:  BP (!) 110/56   Pulse 65   Ht 5' 7 (1.702 m)   Wt 177 lb 6.4 oz (80.5 kg)   SpO2 96%   BMI 27.78 kg/m        Wt Readings from Last 3 Encounters:  06/17/24 177 lb 6.4 oz (80.5 kg)  06/15/24 179 lb (81.2 kg)  01/09/24 181 lb 14.1 oz (82.5 kg)    GEN: Well nourished, well developed in no acute distress NECK: No JVD; No carotid bruits CARDIAC: RRR, no murmurs, rubs, gallops RESPIRATORY:  Clear to auscultation without rales, wheezing or rhonchi  ABDOMEN: Soft, non-tender, non-distended EXTREMITIES:  No edema; No deformity   ASSESSMENT AND PLAN  CAD s/p CABG: Previously underwent CABG in 2020 by Dr. Fleeta Ochoa, due to abnormal PET stress test, patient underwent stenting of SVG to OM in October 2024.  He denies any recent chest discomfort.  He does complain of fatigue if he does too much activity, however I suspect this is related to deconditioning.  He used to run prior to bypass surgery, however has not done so in the past 4 years.  Hyperlipidemia: Cholesterol very well-controlled, LDL less than 55 on Lovaza  and rosuvastatin   DM2: Managed by primary care provider, on metformin  and semaglutide   injection.        Dispo: Follow-up with Dr. Court in 6 months  Signed, Aalijah Lanphere, [place]

## 2024-07-28 NOTE — Telephone Encounter (Signed)
Patient came in to office today and picked up 4 boxes of patient assistance Ozempic.

## 2024-10-25 ENCOUNTER — Other Ambulatory Visit: Payer: Self-pay | Admitting: Physician Assistant

## 2024-10-25 ENCOUNTER — Telehealth: Payer: Self-pay

## 2024-10-25 NOTE — Telephone Encounter (Signed)
Patient came in to office today and picked up 2 boxes of patient assistance Ozempic. 

## 2024-10-25 NOTE — Telephone Encounter (Signed)
 Patient Assistance  Medication:Ozempic  Dosage:1 mg Quantity:2 Date received:10/25/24

## 2024-10-27 ENCOUNTER — Other Ambulatory Visit: Payer: Self-pay

## 2024-10-27 MED ORDER — SEMAGLUTIDE (1 MG/DOSE) 4 MG/3ML ~~LOC~~ SOPN: 1.0000 mg | PEN_INJECTOR | SUBCUTANEOUS | 3 refills | Status: AC

## 2024-11-02 ENCOUNTER — Other Ambulatory Visit: Payer: Self-pay | Admitting: Physician Assistant

## 2024-12-22 ENCOUNTER — Ambulatory Visit: Admitting: Cardiovascular Disease

## 2024-12-22 ENCOUNTER — Encounter: Payer: Self-pay | Admitting: Cardiovascular Disease

## 2024-12-22 VITALS — BP 108/52 | HR 75 | Ht 67.0 in | Wt 180.8 lb

## 2024-12-22 DIAGNOSIS — I351 Nonrheumatic aortic (valve) insufficiency: Secondary | ICD-10-CM | POA: Insufficient documentation

## 2024-12-22 DIAGNOSIS — I25810 Atherosclerosis of coronary artery bypass graft(s) without angina pectoris: Secondary | ICD-10-CM

## 2024-12-22 DIAGNOSIS — Z951 Presence of aortocoronary bypass graft: Secondary | ICD-10-CM | POA: Diagnosis not present

## 2024-12-22 DIAGNOSIS — E782 Mixed hyperlipidemia: Secondary | ICD-10-CM

## 2024-12-22 NOTE — Assessment & Plan Note (Signed)
 2D echo performed 08/26/2023 revealed normal LV systolic function with mild to moderate aortic insufficiency.

## 2024-12-22 NOTE — Patient Instructions (Signed)

## 2024-12-22 NOTE — Progress Notes (Signed)
 "     12/22/2024 ED [facility]   Jul 08, 1941  [id number]  Primary Physician Arloa Elsie SAUNDERS, MD Primary Cardiologist: Dorn JINNY Lesches MD GENI CODY [place], [place]  HPI:  Fred [name] is a 84 y.o.  mildly overweight weight married Caucasian male father of 2, grandfather of 4 grandchildren who is referred by the ER and Dr. Arloa for evaluation of dizziness.  He retired in 2000 from working at [employer] in [place].  I last saw him in the office 12/15/2023. His cardiac risk factors include treated diabetes and hyperlipidemia.  He does not smoke.  He does have a family history of heart disease with a mother that had CABG and a younger brother who died as well which was found in the evaluation of syncope.  He is never had a heart attack or stroke.  He denies chest pain or shortness of breath.  He is fairly active and routinely walked 3 miles a day up until COVID-19 when he became more sedentary however recently he is try to increase his activity.  On 08/30/2019 he went out for his morning run of 3 miles and at the end he felt weak.  When driving back home he thought that he may have passed out but he was dizzy.  He went to the ER where he was nauseated as well.  ER evaluation was unrevealing.  He said he felt a little bit of chest discomfort this morning when coming to the office but otherwise specifically denied the symptoms.   I performed event monitoring which showed predominantly sinus rhythm with occasional PACs, PVCs and short runs of PSVT. A coronary CTA did show three-vessel disease.  Based on this, I recommended that we proceed with outpatient diagnostic coronary angiography which was done on 10/11/2019 revealing left main/three-vessel disease.  The following day he underwent CABG x4 by Dr. Obadiah with a LIMA to his LAD, vein to ramus branch, obtuse marginal branch and to the PDA.  He was discharged home on 10/20/2019 and has been doing well since.   He underwent  evaluation for ischemia including a 2D echo which showed normal LV function with mild to moderate AI, and abnormal cardiac PET consistent with ischemia.  Based on this he underwent outpatient cardiac catheterization 09/18/2023 by Dr. Elmira revealing patent LIMA to the LAD, patent vein to the PDA with distal disease beyond insertion, occluded to a ramus branch and a high-grade stenosis in the proximal SVG to OM which was successfully stented.    Since I saw him a year ago his remained stable.  He is completely asymptomatic.  He does not formally exercise however.  Active Medications[1]   Allergies[2]  Social History   Socioeconomic History   Marital status: Widowed    Spouse name: Not on file   Number of children: Not on file   Years of education: 12   Highest education level: Some college, no degree  Occupational History   Occupation: Retired  Tobacco Use   Smoking status: Former    Current packs/day: 0.00    Types: Cigarettes    Start date: 12/1952    Quit date: 12/1962    Years since quitting: 62.0   Smokeless tobacco: Never  Substance and Sexual Activity   Alcohol use: No   Drug use: No   Sexual activity: Not on file  Other Topics Concern   Not on file  Social History Narrative   Not on file   Social Drivers of  Health   Tobacco Use: Medium Risk (12/22/2024)   Patient History    Smoking Tobacco Use: Former    Smokeless Tobacco Use: Never    Passive Exposure: Not on Actuary Strain: Not on file  Food Insecurity: Not on file  Transportation Needs: Not on file  Physical Activity: Not on file  Stress: Not on file  Social Connections: Not on file  Intimate Partner Violence: Not on file  Depression (PHQ2-9): Low Risk (01/14/2024)   Depression (PHQ2-9)    PHQ-2 Score: 1  Alcohol Screen: Not on file  Housing: Not on file  Utilities: Not on file  Health Literacy: Not on file     Review of Systems: General: negative for chills, fever, night sweats  or weight changes.  Cardiovascular: negative for chest pain, dyspnea on exertion, edema, orthopnea, palpitations, paroxysmal nocturnal dyspnea or shortness of breath Dermatological: negative for rash Respiratory: negative for cough or wheezing Urologic: negative for hematuria Abdominal: negative for nausea, vomiting, diarrhea, bright red blood per rectum, melena, or hematemesis Neurologic: negative for visual changes, syncope, or dizziness All other systems reviewed and are otherwise negative except as noted above.    Blood pressure (!) 108/52, pulse 75, height 5' 7 (1.702 m), weight 180 lb 12.8 oz (82 kg), SpO2 95%.  General appearance: alert and no distress Neck: no adenopathy, no carotid bruit, no JVD, supple, symmetrical, trachea midline, and thyroid  not enlarged, symmetric, no tenderness/mass/nodules Lungs: clear to auscultation bilaterally Heart: regular rate and rhythm, S1, S2 normal, no murmur, click, rub or gallop Extremities: extremities normal, atraumatic, no cyanosis or edema Pulses: 2+ and symmetric Skin: Skin color, texture, turgor normal. No rashes or lesions Neurologic: Grossly normal  EKG EKG Interpretation Date/Time:  Wednesday December 22 2024 09:41:49 EST Ventricular Rate:  75 PR Interval:  150 QRS Duration:  92 QT Interval:  394 QTC Calculation: 439 R Axis:   92  Text Interpretation: Normal sinus rhythm Rightward axis Incomplete right bundle branch block When compared with ECG of 17-Jun-2024 09:15, Incomplete right bundle branch block is now Present Confirmed by Court Carrier [phone]) on 12/22/2024 9:58:31 AM    ASSESSMENT AND PLAN:   Hyperlipidemia History of hyperlipidemia on statin therapy with lipid profile performed 02/25/2024 revealing total cholesterol 92, LDL 36 and HDL of 33.  S/P CABG x 4 History of CAD status post cardiac catheterization at which I performed 10/11/2019 revealing left main/three-vessel disease.  The following day underwent CABG x 4  by Dr. Obadiah with a LIMA to his LAD, vein to a ramus branch, marginal branch and PDA.  He had a cardiac PET study done in 2024 which was abnormal consistent with ischemia.  Cardiac cath performed by Dr. Elmira 09/18/2023 was notable for an occluded ramus branch vein graft and a high-grade proximal OM SVG stenosis which was successfully stented.  The patient was clinically improved.  He currently denies chest pain or shortness of breath.  Aortic insufficiency 2D echo performed 08/26/2023 revealed normal LV systolic function with mild to moderate aortic insufficiency.     Carrier DOROTHA Court MD FACP,FACC,FAHA, [facility] 12/22/2024 10:08 AM    [1]  Current Meds  Medication Sig   Accu-Chek FastClix Lancets MISC USE 1 TO 4 TIMES DAILY AS  NEEDED/DIRECTED   ACCU-CHEK GUIDE TEST test strip USE TO CHECK BLOOD SUGAR DAILY  AS DIRECTED   acetaminophen  (TYLENOL ) 500 MG tablet Take 500 mg by mouth every 6 (six) hours as needed.   aspirin  81 MG tablet  Take 1 tablet (81 mg total) by mouth daily.   Blood Glucose Monitoring Suppl (ACCU-CHEK AVIVA) device by Other route. Use as instructed 1 time daily   clopidogrel  (PLAVIX ) 75 MG tablet TAKE 1 TABLET BY MOUTH DAILY  WITH BREAKFAST   Continuous Glucose Sensor (DEXCOM G6 SENSOR) MISC by Does not apply route.   docusate sodium  (COLACE) 50 MG capsule Take 100 mg by mouth 2 (two) times daily.   FARXIGA  10 MG TABS tablet Take 1 tablet (10 mg total) by mouth daily before breakfast.   Insulin  Pen Needle 32G X 4 MM MISC 1 Device by Does not apply route daily.   levothyroxine  (SYNTHROID ) 25 MCG tablet Take 1 tablet (25 mcg total) by mouth daily before breakfast.   metFORMIN  (GLUCOPHAGE ) 1000 MG tablet Take 1 tablet (1,000 mg total) by mouth 2 (two) times daily with a meal.   Multiple Vitamins-Minerals (MULTIVITAMIN WITH MINERALS) tablet Take 1 tablet by mouth daily.   nitroGLYCERIN  (NITROSTAT ) 0.4 MG SL tablet Place 1 tablet (0.4 mg total) under the tongue every 5  (five) minutes as needed for chest pain.   omega-3 acid ethyl esters (LOVAZA ) 1 g capsule TAKE 1 CAPSULE BY MOUTH TWICE  DAILY   rosuvastatin  (CRESTOR ) 20 MG tablet TAKE 1 TABLET BY MOUTH DAILY   Semaglutide , 1 MG/DOSE, 4 MG/3ML SOPN Inject 1 mg as directed once a week.   solifenacin (VESICARE) 10 MG tablet Take 10 mg by mouth daily.   tamsulosin (FLOMAX) 0.4 MG CAPS capsule Take 0.4 mg by mouth daily.   TOUJEO  MAX SOLOSTAR 300 UNIT/ML Solostar Pen Inject 40 Units into the skin daily.   [DISCONTINUED] acetaminophen  (TYLENOL ) 650 MG CR tablet Take 1,300 mg by mouth 2 (two) times daily.  [2] No Known Allergies  "

## 2024-12-22 NOTE — Assessment & Plan Note (Signed)
 History of CAD status post cardiac catheterization at which I performed 10/11/2019 revealing left main/three-vessel disease.  The following day underwent CABG x 4 by Dr. Obadiah with a LIMA to his LAD, vein to a ramus branch, marginal branch and PDA.  He had a cardiac PET study done in 2024 which was abnormal consistent with ischemia.  Cardiac cath performed by Dr. Elmira 09/18/2023 was notable for an occluded ramus branch vein graft and a high-grade proximal OM SVG stenosis which was successfully stented.  The patient was clinically improved.  He currently denies chest pain or shortness of breath.

## 2024-12-22 NOTE — Assessment & Plan Note (Signed)
 History of hyperlipidemia on statin therapy with lipid profile performed 02/25/2024 revealing total cholesterol 92, LDL 36 and HDL of 33.
